# Patient Record
Sex: Male | Born: 1950 | Race: White | Hispanic: No | Marital: Single | Smoking: Former smoker
Health system: Southern US, Community
[De-identification: ages and names within clinical notes are randomized; demographics above are authoritative.]

## PROBLEM LIST (undated history)

## (undated) DIAGNOSIS — I4891 Unspecified atrial fibrillation: Secondary | ICD-10-CM

## (undated) DIAGNOSIS — E0590 Thyrotoxicosis, unspecified without thyrotoxic crisis or storm: Secondary | ICD-10-CM

## (undated) DIAGNOSIS — I10 Essential (primary) hypertension: Secondary | ICD-10-CM

## (undated) DIAGNOSIS — I499 Cardiac arrhythmia, unspecified: Secondary | ICD-10-CM

## (undated) DIAGNOSIS — E109 Type 1 diabetes mellitus without complications: Secondary | ICD-10-CM

## (undated) DIAGNOSIS — C801 Malignant (primary) neoplasm, unspecified: Secondary | ICD-10-CM

## (undated) DIAGNOSIS — Z789 Other specified health status: Secondary | ICD-10-CM

## (undated) DIAGNOSIS — Z809 Family history of malignant neoplasm, unspecified: Secondary | ICD-10-CM

## (undated) DIAGNOSIS — K635 Polyp of colon: Secondary | ICD-10-CM

## (undated) DIAGNOSIS — I639 Cerebral infarction, unspecified: Secondary | ICD-10-CM

## (undated) DIAGNOSIS — Z593 Problems related to living in residential institution: Secondary | ICD-10-CM

## (undated) HISTORY — DX: Family history of malignant neoplasm, unspecified: Z80.9

## (undated) HISTORY — PX: HEMICOLECTOMY: SHX854

## (undated) HISTORY — PX: COLONOSCOPY: SHX174

---

## 2011-06-28 ENCOUNTER — Inpatient Hospital Stay: Payer: Self-pay | Admitting: Specialist

## 2011-06-28 LAB — TROPONIN I: Troponin-I: <0.02 ng/mL

## 2011-06-28 LAB — CBC
HCT: 24.5 % — ABNORMAL LOW (ref 40.0–52.0)
MCHC: 33.8 g/dL (ref 32.0–36.0)
Platelet: 347 10*3/uL (ref 150–440)
RBC: 2.86 10*6/uL — ABNORMAL LOW (ref 4.40–5.90)
RDW: 13.2 % (ref 11.5–14.5)
WBC: 8.8 10*3/uL (ref 3.8–10.6)

## 2011-06-28 LAB — URINALYSIS, COMPLETE
Blood: NEGATIVE
Glucose,UR: >=500 mg/dL (ref 0–75)
Hyaline Cast: 1
Nitrite: NEGATIVE
Ph: 5 (ref 4.5–8.0)
RBC,UR: 1 /HPF (ref 0–5)
Squamous Epithelial: NONE SEEN
WBC UR: 1 /HPF (ref 0–5)

## 2011-06-28 LAB — COMPREHENSIVE METABOLIC PANEL
Albumin: 3.2 g/dL — ABNORMAL LOW (ref 3.4–5.0)
BUN: 27 mg/dL — ABNORMAL HIGH (ref 7–18)
Chloride: 102 mmol/L (ref 98–107)
Co2: 22 mmol/L (ref 21–32)
Creatinine: 1.36 mg/dL — ABNORMAL HIGH (ref 0.60–1.30)
EGFR (Non-African Amer.): 56 — ABNORMAL LOW
Glucose: 224 mg/dL — ABNORMAL HIGH (ref 65–99)
Potassium: 4.5 mmol/L (ref 3.5–5.1)
SGOT(AST): 23 U/L (ref 15–37)
Total Protein: 6.8 g/dL (ref 6.4–8.2)

## 2011-06-28 LAB — CK TOTAL AND CKMB (NOT AT ARMC)
CK, Total: 86 U/L (ref 35–232)
CK-MB: 1.1 ng/mL (ref 0.5–3.6)

## 2011-06-28 LAB — LIPASE, BLOOD: Lipase: 66 U/L — ABNORMAL LOW (ref 73–393)

## 2011-06-28 LAB — PROTIME-INR: INR: 1.1

## 2011-06-29 LAB — URINALYSIS, COMPLETE
Blood: NEGATIVE
Glucose,UR: 150 mg/dL (ref 0–75)
Leukocyte Esterase: NEGATIVE
Ph: 5 (ref 4.5–8.0)
Protein: NEGATIVE
RBC,UR: <1 /HPF (ref 0–5)
Specific Gravity: 1.006 (ref 1.003–1.030)
Squamous Epithelial: NONE SEEN
WBC UR: <1 /HPF (ref 0–5)

## 2011-06-29 LAB — BASIC METABOLIC PANEL
BUN: 17 mg/dL (ref 7–18)
Calcium, Total: 7.9 mg/dL — ABNORMAL LOW (ref 8.5–10.1)
Co2: 24 mmol/L (ref 21–32)
Creatinine: 0.97 mg/dL (ref 0.60–1.30)
EGFR (African American): >60
Potassium: 4.4 mmol/L (ref 3.5–5.1)
Sodium: 139 mmol/L (ref 136–145)

## 2011-06-29 LAB — CBC WITH DIFFERENTIAL/PLATELET
Basophil #: 0 10*3/uL (ref 0.0–0.1)
Eosinophil #: 0.1 10*3/uL (ref 0.0–0.7)
HCT: 26.5 % — ABNORMAL LOW (ref 40.0–52.0)
Lymphocyte #: 1.5 10*3/uL (ref 1.0–3.6)
MCHC: 33.5 g/dL (ref 32.0–36.0)
MCV: 87 fL (ref 80–100)
Monocyte #: 0.8 x10 3/mm (ref 0.2–1.0)
Neutrophil #: 4.5 10*3/uL (ref 1.4–6.5)
Neutrophil %: 64.9 %
Platelet: 290 10*3/uL (ref 150–440)
RDW: 13.9 % (ref 11.5–14.5)

## 2011-06-29 LAB — HEMOGLOBIN A1C: Hemoglobin A1C: 6.6 % — ABNORMAL HIGH (ref 4.2–6.3)

## 2011-06-29 LAB — TROPONIN I
Troponin-I: <0.02 ng/mL
Troponin-I: <0.02 ng/mL

## 2011-06-29 LAB — CK TOTAL AND CKMB (NOT AT ARMC): CK-MB: 1.2 ng/mL (ref 0.5–3.6)

## 2011-06-29 LAB — HEMOGLOBIN: HGB: 8.7 g/dL — ABNORMAL LOW (ref 13.0–18.0)

## 2011-06-30 LAB — CBC WITH DIFFERENTIAL/PLATELET
Basophil #: 0 10*3/uL (ref 0.0–0.1)
Eosinophil #: 0.2 10*3/uL (ref 0.0–0.7)
Eosinophil %: 2.1 %
HCT: 25.6 % — ABNORMAL LOW (ref 40.0–52.0)
HGB: 8.2 g/dL — ABNORMAL LOW (ref 13.0–18.0)
MCH: 28 pg (ref 26.0–34.0)
MCHC: 32.1 g/dL (ref 32.0–36.0)
MCV: 87 fL (ref 80–100)
Monocyte #: 0.7 x10 3/mm (ref 0.2–1.0)
Monocyte %: 9 %
Neutrophil #: 5.8 10*3/uL (ref 1.4–6.5)
Neutrophil %: 74.8 %
Platelet: 260 10*3/uL (ref 150–440)
RBC: 2.94 10*6/uL — ABNORMAL LOW (ref 4.40–5.90)
WBC: 7.8 10*3/uL (ref 3.8–10.6)

## 2011-07-01 ENCOUNTER — Ambulatory Visit: Payer: Self-pay | Admitting: Internal Medicine

## 2011-07-01 LAB — CBC WITH DIFFERENTIAL/PLATELET
Basophil #: 0 10*3/uL (ref 0.0–0.1)
Basophil %: 0.4 %
Eosinophil %: 0.8 %
HCT: 26.6 % — ABNORMAL LOW (ref 40.0–52.0)
HGB: 8.8 g/dL — ABNORMAL LOW (ref 13.0–18.0)
Lymphocyte #: 1.7 10*3/uL (ref 1.0–3.6)
MCH: 28.8 pg (ref 26.0–34.0)
MCHC: 33.1 g/dL (ref 32.0–36.0)
Monocyte %: 11.2 %
Neutrophil %: 70.7 %
Platelet: 318 10*3/uL (ref 150–440)
RDW: 14 % (ref 11.5–14.5)
WBC: 10.1 10*3/uL (ref 3.8–10.6)

## 2011-07-01 LAB — MAGNESIUM: Magnesium: 1.6 mg/dL — ABNORMAL LOW

## 2011-07-01 LAB — WBCS, STOOL

## 2011-07-01 LAB — POTASSIUM: Potassium: 3.9 mmol/L (ref 3.5–5.1)

## 2011-07-02 LAB — CBC WITH DIFFERENTIAL/PLATELET
Basophil #: 0.1 10*3/uL (ref 0.0–0.1)
HCT: 24.3 % — ABNORMAL LOW (ref 40.0–52.0)
HGB: 8 g/dL — ABNORMAL LOW (ref 13.0–18.0)
Lymphocyte #: 0.9 10*3/uL — ABNORMAL LOW (ref 1.0–3.6)
Lymphocyte %: 9.9 %
MCH: 28.4 pg (ref 26.0–34.0)
MCV: 87 fL (ref 80–100)
Monocyte #: 0.9 x10 3/mm (ref 0.2–1.0)
Neutrophil #: 7 10*3/uL — ABNORMAL HIGH (ref 1.4–6.5)
Platelet: 277 10*3/uL (ref 150–440)
WBC: 8.9 10*3/uL (ref 3.8–10.6)

## 2011-07-02 LAB — COMPREHENSIVE METABOLIC PANEL
Albumin: 2.9 g/dL — ABNORMAL LOW (ref 3.4–5.0)
BUN: 17 mg/dL (ref 7–18)
Bilirubin,Total: 0.5 mg/dL (ref 0.2–1.0)
Chloride: 100 mmol/L (ref 98–107)
Co2: 22 mmol/L (ref 21–32)
Creatinine: 1.35 mg/dL — ABNORMAL HIGH (ref 0.60–1.30)
EGFR (African American): >60
EGFR (Non-African Amer.): 57 — ABNORMAL LOW
Glucose: 402 mg/dL — ABNORMAL HIGH (ref 65–99)

## 2011-07-02 LAB — HEMOGLOBIN: HGB: 8.1 g/dL — ABNORMAL LOW (ref 13.0–18.0)

## 2011-07-03 LAB — CBC WITH DIFFERENTIAL/PLATELET
Basophil #: 0 10*3/uL (ref 0.0–0.1)
Basophil %: 0.1 %
Eosinophil #: 0 10*3/uL (ref 0.0–0.7)
HCT: 25.5 % — ABNORMAL LOW (ref 40.0–52.0)
Lymphocyte #: 1 10*3/uL (ref 1.0–3.6)
Lymphocyte %: 5.6 %
MCHC: 32.9 g/dL (ref 32.0–36.0)
MCV: 87 fL (ref 80–100)
Neutrophil #: 15.6 10*3/uL — ABNORMAL HIGH (ref 1.4–6.5)
Neutrophil %: 85.8 %
WBC: 18.2 10*3/uL — ABNORMAL HIGH (ref 3.8–10.6)

## 2011-07-03 LAB — BASIC METABOLIC PANEL
Anion Gap: 19 — ABNORMAL HIGH (ref 7–16)
BUN: 14 mg/dL (ref 7–18)
Chloride: 106 mmol/L (ref 98–107)
Creatinine: 1.04 mg/dL (ref 0.60–1.30)
EGFR (African American): >60
EGFR (Non-African Amer.): >60
Glucose: 267 mg/dL — ABNORMAL HIGH (ref 65–99)
Potassium: 4.4 mmol/L (ref 3.5–5.1)
Sodium: 141 mmol/L (ref 136–145)

## 2011-07-03 LAB — STOOL CULTURE

## 2011-07-04 LAB — BASIC METABOLIC PANEL
BUN: 17 mg/dL (ref 7–18)
Calcium, Total: 8.6 mg/dL (ref 8.5–10.1)
Co2: 12 mmol/L — ABNORMAL LOW (ref 21–32)
Creatinine: 1.04 mg/dL (ref 0.60–1.30)
EGFR (African American): >60
Glucose: 295 mg/dL — ABNORMAL HIGH (ref 65–99)
Potassium: 4.4 mmol/L (ref 3.5–5.1)

## 2011-07-04 LAB — CBC WITH DIFFERENTIAL/PLATELET
Basophil #: 0.1 10*3/uL (ref 0.0–0.1)
Eosinophil #: 0 10*3/uL (ref 0.0–0.7)
Lymphocyte #: 0.5 10*3/uL — ABNORMAL LOW (ref 1.0–3.6)
MCH: 28.3 pg (ref 26.0–34.0)
MCHC: 32 g/dL (ref 32.0–36.0)
MCV: 89 fL (ref 80–100)
Monocyte #: 1.2 x10 3/mm — ABNORMAL HIGH (ref 0.2–1.0)
Neutrophil #: 14.7 10*3/uL — ABNORMAL HIGH (ref 1.4–6.5)

## 2011-07-05 LAB — CBC WITH DIFFERENTIAL/PLATELET
Basophil #: 0 10*3/uL (ref 0.0–0.1)
Basophil %: 0.2 %
Eosinophil #: 0 10*3/uL (ref 0.0–0.7)
Eosinophil %: 0.1 %
HCT: 23.5 % — ABNORMAL LOW (ref 40.0–52.0)
HGB: 7.5 g/dL — ABNORMAL LOW (ref 13.0–18.0)
Lymphocyte #: 0.8 10*3/uL — ABNORMAL LOW (ref 1.0–3.6)
Lymphocyte %: 7.1 %
MCH: 28 pg (ref 26.0–34.0)
MCHC: 31.9 g/dL — ABNORMAL LOW (ref 32.0–36.0)
MCV: 88 fL (ref 80–100)
Monocyte %: 6.7 %
Neutrophil #: 9.7 10*3/uL — ABNORMAL HIGH (ref 1.4–6.5)
Neutrophil %: 85.9 %
RDW: 14.7 % — ABNORMAL HIGH (ref 11.5–14.5)

## 2011-07-05 LAB — PATHOLOGY REPORT

## 2011-07-06 LAB — CBC WITH DIFFERENTIAL/PLATELET
Basophil #: 0.1 10*3/uL (ref 0.0–0.1)
Lymphocyte %: 16 %
MCH: 27.9 pg (ref 26.0–34.0)
Monocyte #: 0.7 x10 3/mm (ref 0.2–1.0)
Monocyte %: 7.8 %
Neutrophil #: 6.3 10*3/uL (ref 1.4–6.5)
Neutrophil %: 74.1 %
Platelet: 384 10*3/uL (ref 150–440)
RBC: 2.94 10*6/uL — ABNORMAL LOW (ref 4.40–5.90)
WBC: 8.5 10*3/uL (ref 3.8–10.6)

## 2011-07-10 ENCOUNTER — Ambulatory Visit: Payer: Self-pay | Admitting: Surgery

## 2011-07-14 LAB — WOUND CULTURE

## 2011-07-16 ENCOUNTER — Ambulatory Visit: Payer: Self-pay | Admitting: Internal Medicine

## 2011-07-16 LAB — IRON AND TIBC
Iron Bind.Cap.(Total): 407 ug/dL (ref 250–450)
Iron Saturation: 11 %
Unbound Iron-Bind.Cap.: 363 ug/dL

## 2011-07-16 LAB — CBC CANCER CENTER
Basophil #: 0 x10 3/mm (ref 0.0–0.1)
Eosinophil #: 0.2 x10 3/mm (ref 0.0–0.7)
Eosinophil %: 2.1 %
HCT: 26.3 % — ABNORMAL LOW (ref 40.0–52.0)
Lymphocyte #: 1.4 x10 3/mm (ref 1.0–3.6)
Lymphocyte %: 18.2 %
MCH: 27.1 pg (ref 26.0–34.0)
MCV: 85 fL (ref 80–100)
Monocyte #: 0.7 x10 3/mm (ref 0.2–1.0)
Neutrophil %: 69.6 %
Platelet: 566 x10 3/mm — ABNORMAL HIGH (ref 150–440)
RBC: 3.09 10*6/uL — ABNORMAL LOW (ref 4.40–5.90)
RDW: 14.6 % — ABNORMAL HIGH (ref 11.5–14.5)
WBC: 7.4 x10 3/mm (ref 3.8–10.6)

## 2011-07-19 ENCOUNTER — Ambulatory Visit: Payer: Self-pay | Admitting: Internal Medicine

## 2011-08-06 ENCOUNTER — Ambulatory Visit: Payer: Self-pay | Admitting: Internal Medicine

## 2011-08-06 LAB — CBC CANCER CENTER
Basophil %: 0.4 %
Eosinophil #: 0.2 x10 3/mm (ref 0.0–0.7)
Eosinophil %: 1.8 %
Lymphocyte #: 1.5 x10 3/mm (ref 1.0–3.6)
MCH: 28.6 pg (ref 26.0–34.0)
MCHC: 32.1 g/dL (ref 32.0–36.0)
MCV: 89 fL (ref 80–100)
Monocyte #: 0.8 x10 3/mm (ref 0.2–1.0)
Neutrophil #: 6.2 x10 3/mm (ref 1.4–6.5)
Neutrophil %: 71.3 %
Platelet: 325 x10 3/mm (ref 150–440)

## 2011-09-06 ENCOUNTER — Ambulatory Visit: Payer: Self-pay | Admitting: Internal Medicine

## 2011-10-24 ENCOUNTER — Ambulatory Visit: Payer: Self-pay | Admitting: Internal Medicine

## 2011-10-24 LAB — CBC CANCER CENTER
Basophil #: 0.1 x10 3/mm (ref 0.0–0.1)
Eosinophil #: 0.2 x10 3/mm (ref 0.0–0.7)
HCT: 39.2 % — ABNORMAL LOW (ref 40.0–52.0)
Lymphocyte #: 1.7 x10 3/mm (ref 1.0–3.6)
Lymphocyte %: 26.8 %
MCHC: 33.3 g/dL (ref 32.0–36.0)
MCV: 89 fL (ref 80–100)
Monocyte #: 0.6 x10 3/mm (ref 0.2–1.0)
Neutrophil #: 3.7 x10 3/mm (ref 1.4–6.5)
RBC: 4.41 10*6/uL (ref 4.40–5.90)
RDW: 15.6 % — ABNORMAL HIGH (ref 11.5–14.5)
WBC: 6.2 x10 3/mm (ref 3.8–10.6)

## 2011-10-25 LAB — CEA: CEA: 3.9 ng/mL (ref 0.0–4.7)

## 2011-11-06 ENCOUNTER — Ambulatory Visit: Payer: Self-pay | Admitting: Internal Medicine

## 2012-01-23 ENCOUNTER — Ambulatory Visit: Payer: Self-pay | Admitting: Internal Medicine

## 2012-01-23 LAB — CANCER CENTER HEMOGLOBIN: HGB: 13.3 g/dL (ref 13.0–18.0)

## 2012-01-24 LAB — CEA: CEA: 3.2 ng/mL (ref 0.0–4.7)

## 2012-02-06 ENCOUNTER — Ambulatory Visit: Payer: Self-pay | Admitting: Internal Medicine

## 2012-03-08 DIAGNOSIS — I639 Cerebral infarction, unspecified: Secondary | ICD-10-CM

## 2012-03-08 HISTORY — DX: Cerebral infarction, unspecified: I63.9

## 2012-03-18 ENCOUNTER — Ambulatory Visit: Payer: Self-pay | Admitting: Internal Medicine

## 2012-03-19 ENCOUNTER — Inpatient Hospital Stay: Payer: Self-pay | Admitting: Internal Medicine

## 2012-03-19 LAB — CK TOTAL AND CKMB (NOT AT ARMC)
CK, Total: 108 U/L (ref 35–232)
CK-MB: 1.3 ng/mL (ref 0.5–3.6)

## 2012-03-19 LAB — URINALYSIS, COMPLETE
Bacteria: NONE SEEN
Nitrite: NEGATIVE
Ph: 6 (ref 4.5–8.0)
RBC,UR: 3 /HPF (ref 0–5)
Squamous Epithelial: NONE SEEN

## 2012-03-19 LAB — CBC
MCHC: 34 g/dL (ref 32.0–36.0)
MCV: 90 fL (ref 80–100)
Platelet: 322 10*3/uL (ref 150–440)
WBC: 12.2 10*3/uL — ABNORMAL HIGH (ref 3.8–10.6)

## 2012-03-19 LAB — COMPREHENSIVE METABOLIC PANEL
Anion Gap: 7 (ref 7–16)
Bilirubin,Total: 0.4 mg/dL (ref 0.2–1.0)
Calcium, Total: 9.5 mg/dL (ref 8.5–10.1)
EGFR (African American): >60
EGFR (Non-African Amer.): >60
SGPT (ALT): 32 U/L (ref 12–78)
Sodium: 139 mmol/L (ref 136–145)

## 2012-03-19 LAB — TROPONIN I: Troponin-I: <0.02 ng/mL

## 2012-03-19 LAB — PROTIME-INR: INR: 0.9

## 2012-03-20 LAB — CBC WITH DIFFERENTIAL/PLATELET
Basophil %: 0.5 %
Eosinophil #: 0.1 10*3/uL (ref 0.0–0.7)
Eosinophil %: 0.9 %
HCT: 40.4 % (ref 40.0–52.0)
MCH: 30 pg (ref 26.0–34.0)
MCHC: 32.9 g/dL (ref 32.0–36.0)
MCV: 91 fL (ref 80–100)
Monocyte #: 0.8 x10 3/mm (ref 0.2–1.0)
Neutrophil #: 8.1 10*3/uL — ABNORMAL HIGH (ref 1.4–6.5)
Platelet: 275 10*3/uL (ref 150–440)
RBC: 4.43 10*6/uL (ref 4.40–5.90)

## 2012-03-20 LAB — BASIC METABOLIC PANEL
BUN: 20 mg/dL — ABNORMAL HIGH (ref 7–18)
Chloride: 108 mmol/L — ABNORMAL HIGH (ref 98–107)
Co2: 21 mmol/L (ref 21–32)
Creatinine: 0.98 mg/dL (ref 0.60–1.30)
EGFR (African American): >60
Glucose: 228 mg/dL — ABNORMAL HIGH (ref 65–99)
Potassium: 4.4 mmol/L (ref 3.5–5.1)
Sodium: 138 mmol/L (ref 136–145)

## 2012-03-25 ENCOUNTER — Encounter: Payer: Self-pay | Admitting: *Deleted

## 2012-03-28 LAB — CREATININE, SERUM
EGFR (African American): >60
EGFR (Non-African Amer.): 58 — ABNORMAL LOW

## 2012-05-09 ENCOUNTER — Ambulatory Visit: Payer: Self-pay | Admitting: Internal Medicine

## 2012-06-10 ENCOUNTER — Inpatient Hospital Stay: Payer: Self-pay | Admitting: Specialist

## 2012-06-10 LAB — BASIC METABOLIC PANEL
Anion Gap: 22 — ABNORMAL HIGH (ref 7–16)
Anion Gap: 8 (ref 7–16)
BUN: 44 mg/dL — ABNORMAL HIGH (ref 7–18)
BUN: 42 mg/dL — ABNORMAL HIGH (ref 7–18)
Calcium, Total: 7.3 mg/dL — ABNORMAL LOW (ref 8.5–10.1)
Calcium, Total: 8 mg/dL — ABNORMAL LOW (ref 8.5–10.1)
Chloride: 116 mmol/L — ABNORMAL HIGH (ref 98–107)
Co2: 7 mmol/L — CL (ref 21–32)
Co2: 15 mmol/L — ABNORMAL LOW (ref 21–32)
Co2: 19 mmol/L — ABNORMAL LOW (ref 21–32)
Creatinine: 2.41 mg/dL — ABNORMAL HIGH (ref 0.60–1.30)
EGFR (Non-African Amer.): 29 — ABNORMAL LOW
Glucose: 381 mg/dL — ABNORMAL HIGH (ref 65–99)
Osmolality: 304 (ref 275–301)
Potassium: 3.5 mmol/L (ref 3.5–5.1)
Sodium: 138 mmol/L (ref 136–145)
Sodium: 142 mmol/L (ref 136–145)

## 2012-06-10 LAB — URINALYSIS, COMPLETE
Bacteria: NONE SEEN
Leukocyte Esterase: NEGATIVE
Nitrite: NEGATIVE
RBC,UR: 18 /HPF (ref 0–5)
Specific Gravity: 1.02 (ref 1.003–1.030)
Squamous Epithelial: <1
WBC UR: 1 /HPF (ref 0–5)

## 2012-06-10 LAB — COMPREHENSIVE METABOLIC PANEL
BUN: 51 mg/dL — ABNORMAL HIGH (ref 7–18)
Bilirubin,Total: 0.5 mg/dL (ref 0.2–1.0)
Calcium, Total: 8.6 mg/dL (ref 8.5–10.1)
Chloride: 94 mmol/L — ABNORMAL LOW (ref 98–107)
Co2: 7 mmol/L — CL (ref 21–32)
Creatinine: 3.07 mg/dL — ABNORMAL HIGH (ref 0.60–1.30)
EGFR (African American): 24 — ABNORMAL LOW
EGFR (Non-African Amer.): 21 — ABNORMAL LOW
Glucose: 885 mg/dL (ref 65–99)
Potassium: 6 mmol/L — ABNORMAL HIGH (ref 3.5–5.1)
SGOT(AST): 21 U/L (ref 15–37)
SGPT (ALT): 22 U/L (ref 12–78)

## 2012-06-10 LAB — CBC WITH DIFFERENTIAL/PLATELET
Basophil %: 0.2 %
Eosinophil %: 0.1 %
HGB: 10.5 g/dL — ABNORMAL LOW (ref 13.0–18.0)
Lymphocyte #: 1 10*3/uL (ref 1.0–3.6)
MCH: 30.5 pg (ref 26.0–34.0)
Monocyte #: 0.2 x10 3/mm (ref 0.2–1.0)
Neutrophil #: 21.7 10*3/uL — ABNORMAL HIGH (ref 1.4–6.5)
Platelet: 403 10*3/uL (ref 150–440)
WBC: 22.9 10*3/uL — ABNORMAL HIGH (ref 3.8–10.6)

## 2012-06-11 LAB — CBC WITH DIFFERENTIAL/PLATELET
Basophil %: 0.1 %
Eosinophil #: 0 10*3/uL (ref 0.0–0.7)
HGB: 10.9 g/dL — ABNORMAL LOW (ref 13.0–18.0)
MCHC: 34.1 g/dL (ref 32.0–36.0)
MCV: 90 fL (ref 80–100)
Monocyte #: 1.7 x10 3/mm — ABNORMAL HIGH (ref 0.2–1.0)
Monocyte %: 7.8 %
Neutrophil %: 88.1 %
Platelet: 353 10*3/uL (ref 150–440)
RBC: 3.55 10*6/uL — ABNORMAL LOW (ref 4.40–5.90)
RDW: 13.8 % (ref 11.5–14.5)

## 2012-06-11 LAB — BASIC METABOLIC PANEL
BUN: 36 mg/dL — ABNORMAL HIGH (ref 7–18)
Calcium, Total: 8.4 mg/dL — ABNORMAL LOW (ref 8.5–10.1)
Chloride: 118 mmol/L — ABNORMAL HIGH (ref 98–107)
Creatinine: 1.81 mg/dL — ABNORMAL HIGH (ref 0.60–1.30)
EGFR (African American): 46 — ABNORMAL LOW
EGFR (Non-African Amer.): 39 — ABNORMAL LOW
Glucose: 287 mg/dL — ABNORMAL HIGH (ref 65–99)
Potassium: 4.4 mmol/L (ref 3.5–5.1)

## 2012-06-11 LAB — LIPID PANEL
Cholesterol: 85 mg/dL (ref 0–200)
Ldl Cholesterol, Calc: 27 mg/dL (ref 0–100)
Triglycerides: 24 mg/dL (ref 0–200)

## 2012-06-12 LAB — BASIC METABOLIC PANEL
Anion Gap: 6 — ABNORMAL LOW (ref 7–16)
BUN: 17 mg/dL (ref 7–18)
Chloride: 113 mmol/L — ABNORMAL HIGH (ref 98–107)
Co2: 23 mmol/L (ref 21–32)
Creatinine: 1.02 mg/dL (ref 0.60–1.30)
EGFR (African American): >60
EGFR (Non-African Amer.): >60
Osmolality: 287 (ref 275–301)

## 2012-06-12 LAB — WBC: WBC: 11.3 10*3/uL — ABNORMAL HIGH (ref 3.8–10.6)

## 2012-10-29 ENCOUNTER — Ambulatory Visit: Payer: Self-pay | Admitting: Gastroenterology

## 2012-10-30 LAB — PATHOLOGY REPORT

## 2013-12-17 ENCOUNTER — Ambulatory Visit: Payer: Self-pay

## 2014-03-19 ENCOUNTER — Inpatient Hospital Stay (HOSPITAL_COMMUNITY)
Admission: EM | Admit: 2014-03-19 | Discharge: 2014-03-30 | DRG: 308 | Disposition: A | Discharge status: Home / Self Care | Payer: BC Managed Care – PPO | Source: Other Acute Inpatient Hospital | Attending: Cardiovascular Disease | Admitting: Cardiovascular Disease

## 2014-03-19 ENCOUNTER — Encounter (HOSPITAL_COMMUNITY): Payer: Self-pay | Admitting: *Deleted

## 2014-03-19 ENCOUNTER — Inpatient Hospital Stay (HOSPITAL_COMMUNITY): Payer: BC Managed Care – PPO

## 2014-03-19 ENCOUNTER — Emergency Department: Payer: Self-pay | Admitting: Emergency Medicine

## 2014-03-19 DIAGNOSIS — Z8673 Personal history of transient ischemic attack (TIA), and cerebral infarction without residual deficits: Secondary | ICD-10-CM

## 2014-03-19 DIAGNOSIS — I4581 Long QT syndrome: Secondary | ICD-10-CM | POA: Diagnosis present

## 2014-03-19 DIAGNOSIS — D6489 Other specified anemias: Secondary | ICD-10-CM | POA: Diagnosis present

## 2014-03-19 DIAGNOSIS — E440 Moderate protein-calorie malnutrition: Secondary | ICD-10-CM | POA: Diagnosis present

## 2014-03-19 DIAGNOSIS — E109 Type 1 diabetes mellitus without complications: Secondary | ICD-10-CM

## 2014-03-19 DIAGNOSIS — E0581 Other thyrotoxicosis with thyrotoxic crisis or storm: Secondary | ICD-10-CM | POA: Diagnosis present

## 2014-03-19 DIAGNOSIS — I4891 Unspecified atrial fibrillation: Secondary | ICD-10-CM

## 2014-03-19 DIAGNOSIS — Z7901 Long term (current) use of anticoagulants: Secondary | ICD-10-CM | POA: Diagnosis not present

## 2014-03-19 DIAGNOSIS — I10 Essential (primary) hypertension: Secondary | ICD-10-CM

## 2014-03-19 DIAGNOSIS — E1010 Type 1 diabetes mellitus with ketoacidosis without coma: Secondary | ICD-10-CM | POA: Diagnosis present

## 2014-03-19 DIAGNOSIS — I340 Nonrheumatic mitral (valve) insufficiency: Secondary | ICD-10-CM | POA: Diagnosis present

## 2014-03-19 DIAGNOSIS — Z794 Long term (current) use of insulin: Secondary | ICD-10-CM

## 2014-03-19 DIAGNOSIS — Z6820 Body mass index (BMI) 20.0-20.9, adult: Secondary | ICD-10-CM

## 2014-03-19 DIAGNOSIS — T380X5A Adverse effect of glucocorticoids and synthetic analogues, initial encounter: Secondary | ICD-10-CM | POA: Diagnosis present

## 2014-03-19 DIAGNOSIS — E064 Drug-induced thyroiditis: Secondary | ICD-10-CM | POA: Diagnosis present

## 2014-03-19 DIAGNOSIS — E069 Thyroiditis, unspecified: Secondary | ICD-10-CM

## 2014-03-19 DIAGNOSIS — J90 Pleural effusion, not elsewhere classified: Secondary | ICD-10-CM | POA: Diagnosis present

## 2014-03-19 DIAGNOSIS — Z8249 Family history of ischemic heart disease and other diseases of the circulatory system: Secondary | ICD-10-CM

## 2014-03-19 DIAGNOSIS — I480 Paroxysmal atrial fibrillation: Secondary | ICD-10-CM | POA: Diagnosis present

## 2014-03-19 DIAGNOSIS — T462X5A Adverse effect of other antidysrhythmic drugs, initial encounter: Secondary | ICD-10-CM | POA: Diagnosis present

## 2014-03-19 DIAGNOSIS — E0590 Thyrotoxicosis, unspecified without thyrotoxic crisis or storm: Secondary | ICD-10-CM

## 2014-03-19 DIAGNOSIS — R739 Hyperglycemia, unspecified: Secondary | ICD-10-CM

## 2014-03-19 HISTORY — DX: Malignant (primary) neoplasm, unspecified: C80.1

## 2014-03-19 HISTORY — DX: Cardiac arrhythmia, unspecified: I49.9

## 2014-03-19 HISTORY — DX: Thyrotoxicosis, unspecified without thyrotoxic crisis or storm: E05.90

## 2014-03-19 HISTORY — DX: Type 1 diabetes mellitus without complications: E10.9

## 2014-03-19 HISTORY — DX: Cerebral infarction, unspecified: I63.9

## 2014-03-19 HISTORY — DX: Unspecified atrial fibrillation: I48.91

## 2014-03-19 LAB — CBC WITH DIFFERENTIAL/PLATELET
Basophils Absolute: 0 10*3/uL (ref 0.0–0.1)
Basophils Relative: 0 % (ref 0–1)
EOS ABS: 0 10*3/uL (ref 0.0–0.7)
Eosinophils Relative: 0 % (ref 0–5)
HEMATOCRIT: 47.1 % (ref 39.0–52.0)
HEMOGLOBIN: 15.2 g/dL (ref 13.0–17.0)
LYMPHS ABS: 0.9 10*3/uL (ref 0.7–4.0)
Lymphocytes Relative: 6 % — ABNORMAL LOW (ref 12–46)
MCH: 29.6 pg (ref 26.0–34.0)
MCHC: 32.3 g/dL (ref 30.0–36.0)
MCV: 91.8 fL (ref 78.0–100.0)
Monocytes Absolute: 1 10*3/uL (ref 0.1–1.0)
Monocytes Relative: 6 % (ref 3–12)
Neutro Abs: 13.7 10*3/uL — ABNORMAL HIGH (ref 1.7–7.7)
Neutrophils Relative %: 88 % — ABNORMAL HIGH (ref 43–77)
PLATELETS: 201 10*3/uL (ref 150–400)
RBC: 5.13 MIL/uL (ref 4.22–5.81)
RDW: 14.4 % (ref 11.5–15.5)
WBC: 15.6 10*3/uL — ABNORMAL HIGH (ref 4.0–10.5)

## 2014-03-19 LAB — GLUCOSE, CAPILLARY: GLUCOSE-CAPILLARY: 134 mg/dL — AB (ref 70–99)

## 2014-03-19 LAB — MRSA PCR SCREENING: MRSA by PCR: NEGATIVE

## 2014-03-19 MED ORDER — CETYLPYRIDINIUM CHLORIDE 0.05 % MT LIQD
7.0000 mL | Freq: Two times a day (BID) | OROMUCOSAL | Status: DC
  Administered 2014-03-19 – 2014-03-22 (×7): 7 mL via OROMUCOSAL

## 2014-03-19 MED ORDER — FOLIC ACID 1 MG PO TABS
1.0000 mg | ORAL_TABLET | Freq: Every day | ORAL | Status: DC
  Administered 2014-03-20 – 2014-03-30 (×11): 1 mg via ORAL
  Filled 2014-03-19 (×11): qty 1

## 2014-03-19 MED ORDER — DABIGATRAN ETEXILATE MESYLATE 150 MG PO CAPS
150.0000 mg | ORAL_CAPSULE | Freq: Two times a day (BID) | ORAL | Status: DC
  Administered 2014-03-19 – 2014-03-30 (×22): 150 mg via ORAL
  Filled 2014-03-19 (×24): qty 1

## 2014-03-19 MED ORDER — ACETAMINOPHEN 325 MG PO TABS
650.0000 mg | ORAL_TABLET | ORAL | Status: DC | PRN
  Administered 2014-03-29: 650 mg via ORAL
  Filled 2014-03-19: qty 2

## 2014-03-19 MED ORDER — INSULIN ASPART 100 UNIT/ML ~~LOC~~ SOLN: 0.0000 [IU] | Freq: Every day | SUBCUTANEOUS | Status: DC

## 2014-03-19 MED ORDER — BENAZEPRIL HCL 20 MG PO TABS
20.0000 mg | ORAL_TABLET | Freq: Every day | ORAL | Status: DC
  Filled 2014-03-19: qty 1

## 2014-03-19 MED ORDER — METOPROLOL TARTRATE 25 MG PO TABS
25.0000 mg | ORAL_TABLET | Freq: Two times a day (BID) | ORAL | Status: DC
  Administered 2014-03-19: 25 mg via ORAL
  Filled 2014-03-19 (×3): qty 1

## 2014-03-19 MED ORDER — ONDANSETRON HCL 4 MG/2ML IJ SOLN: 4.0000 mg | Freq: Four times a day (QID) | INTRAMUSCULAR | Status: DC | PRN

## 2014-03-19 MED ORDER — INSULIN ASPART 100 UNIT/ML ~~LOC~~ SOLN
0.0000 [IU] | Freq: Three times a day (TID) | SUBCUTANEOUS | Status: DC
Start: 2014-03-20 — End: 2014-03-20
  Administered 2014-03-20 (×2): 15 [IU] via SUBCUTANEOUS

## 2014-03-19 MED ORDER — DILTIAZEM HCL 100 MG IV SOLR
5.0000 mg/h | INTRAVENOUS | Status: DC
  Administered 2014-03-19 – 2014-03-21 (×4): 15 mg/h via INTRAVENOUS
  Administered 2014-03-21: 10 mg/h via INTRAVENOUS
  Administered 2014-03-21: 15 mg/h via INTRAVENOUS
  Filled 2014-03-19 (×3): qty 100

## 2014-03-19 MED ORDER — INSULIN GLARGINE 100 UNIT/ML ~~LOC~~ SOLN
12.0000 [IU] | Freq: Every day | SUBCUTANEOUS | Status: DC
  Filled 2014-03-19: qty 0.12

## 2014-03-19 NOTE — H&P (Cosign Needed)
Patient ID: Jordan [name] MRN: [id number], DOB/AGE: 1950-11-12   Admit date: 03/19/2014   Primary Physician: Albina Billet, MD Primary Cardiologist: Humphrey Rolls - Cardiology at [place]: AF-RVR  Problem List  Past Medical History  Diagnosis Date  . Dysrhythmia     AFib  . Hyperthyroidism   . Stroke   . Cancer     colo/rectal  . Diabetes mellitus without complication     No past surgical history on file.   Allergies  Allergies not on file  HPI 83M with a history of DM1, prior amiodarone induce thyroiditis and persistent AF was seen in Clinic earlier today and was found to have elevated HR in the 140s-160s range. He reports that he has had elevated HR for months that he been refractive to effective therapy. He reports that he has been diagnosed with AF for at least a year. He was previously placed on amiodarone but developed weight loss and subsequent TSH was <0.015 with positive thyroid uptake scan and thyrotropin receptor Ab. Most recent thyroid labs were improved after stopping amiodarone in 12/2013. He is currently on sotalol 40mg  bid, recently increased from 40mg  daily in 01/2014.  He is overall asymptomatic and reports that he has had elevated HR for at least the last month (he bought a home BP monitor to assess). He drinks 1 cup of decaf coffee daily and a lot of soda but states his soda does not have caffeine either. Does not have a dx of sleep apnea.  Given his elevated HR in clinic he was sent to [facility] ED and had rates in the 160s, AF. Diltiazem gtt was started to 20mg /hr with no effect. Given his inability to tolerate amiodarone and the lack of ICU beds there he was transferred here for additional evaluation and management.   Records are unavailable but he does not report a history of CHF or CAD. However, he does have DM1 thought to be well controlled (A1c 6.5) but he has episodes of hyper/hypoglycemia. He reports taking his dabigatran without missing any doses;  however, he does not know all of his medications by name and is unclear of his entire regimen.  Home Medications   Prior to Admission medications   Medication Sig Start Date End Date Taking? Authorizing Provider  benazepril (LOTENSIN) 20 MG tablet Take 20 mg by mouth daily.    Historical Provider, MD  dabigatran (PRADAXA) 150 MG CAPS capsule Take 150 mg by mouth 2 (two) times daily.    Historical Provider, MD  folic acid (FOLVITE) 1 MG tablet Take 1 mg by mouth daily.    Historical Provider, MD  insulin aspart (NOVOLOG) 100 UNIT/ML injection Inject into the skin 3 (three) times daily before meals. 6U with breakfast, 4U lunch, 3U supper    Historical Provider, MD  insulin glargine (LANTUS) 100 UNIT/ML injection Inject 12 Units into the skin at bedtime.    Historical Provider, MD  sotalol (BETAPACE) 80 MG tablet Take 40 mg by mouth 2 (two) times daily.    Historical Provider, MD    Family History  Mother died of CV causes but details unclear. No family history of SCD  Social History  History   Social History  . Marital Status: Single    Spouse Name: N/A  . Number of Children: N/A  . Years of Education: N/A   Occupational History  . Not on file.   Social History Main Topics  . Smoking status: Not on file  .  Smokeless tobacco: Not on file  . Alcohol Use: Not on file  . Drug Use: Not on file  . Sexual Activity: Not Currently   Other Topics Concern  . Not on file   Social History Narrative  . No narrative on file     Review of Systems General:  No chills, fever, night sweats or weight changes.  Cardiovascular:  No chest pain, dyspnea on exertion, edema, orthopnea, palpitations, paroxysmal nocturnal dyspnea. Dermatological: No rash, lesions/masses Respiratory: No cough, dyspnea Urologic: No hematuria, dysuria Abdominal:   No nausea, vomiting, diarrhea, bright red blood per rectum, melena, or hematemesis Neurologic:  No visual changes, wkns, changes in mental status. All  other systems reviewed and are otherwise negative except as noted above.  Physical Exam  Blood pressure 138/74, pulse 162, temperature 98.5 F (36.9 C), temperature source Oral, resp. rate 20, height 5\' 9"  (1.753 m), weight 139 lb 1.8 oz (63.1 kg), SpO2 95 %.  General: Pleasant, NAD Psych: Normal affect. Neuro: Alert and oriented X 3. Moves all extremities spontaneously. HEENT: Normal  Neck: Supple without bruits or JVD. Lungs:  Resp regular and unlabored, CTA. Heart: RRR no s3, s4, or murmurs. Abdomen: Soft, non-tender, non-distended, BS + x 4.  Extremities: No clubbing, cyanosis or edema. DP/PT/Radials 2+ and equal bilaterally.  Labs  Troponin (Point of Care Test) [facility] ED: Na 139 Cl 112 K 4.1 CO2 27 BUN 21 Cr 0.8  WKC 6,5 Hct 38.5 Plt 257   Radiology/Studies CXR pending  ECG AF with HR in the 160s, no prior here  ASSESSMENT AND PLAN 1. Atrial fibrillation with rapid ventricular response 2. Hypertension 3. Diabetes, Type 1 by report  48M with a history of DM1, prior amiodarone induce thyroiditis and persistent AF was seen in Clinic earlier today and was found to have elevated HR in the 140s-160s range consistent with rapid AF. His management has been challenging due to amiodarone induced thyroiditis. Unfortuantely we do not have records of all of his prior rate and rhythm control strategies. Given failure of rate control, would likely favor rhythm control for him. However his AAD regimen may be challenging. We do not have records of any history of prior CAD or structural disease but given his age and history of DM1, it would be unusual for him to have no CAD. This would make using a class I agent challenging. It is also unclear why he is on such a low dose of sotalol. I do not have a prior EKG to check his QTc. At this point would favor either AAD therapy with dofetilide and at least one trial of DCCV but ablation is also a reasonable option if he is willing. Increasing  sotalol may also be a reasonable choice. He does not have obvious modifiable risk factors.  -continue diltiazem for now -start metoprolol 25mg  bid -TTE to evaluate LVEF but would do so after rate controlled or SR achieved -holding sotalol for now pending AAD therapy decision -will DCCV overnight if unstable, otherwise consider in AM. Will make patient NPO p MN -TSH, T4 -continue pradaxa 150mg  bid. He states that he takes regularly without missing doses but he is not savvy with his medications so can consider TEE prior to rhythm management if unsure  2. Hypertension -continue benazepril -start metoprolol  3. DM -home lantus  -SSI -diabetic diet  FULL CODE  Signed, Raliegh Ip, MD MPH 03/19/2014, 10:21 PM

## 2014-03-20 ENCOUNTER — Inpatient Hospital Stay (HOSPITAL_COMMUNITY): Payer: BC Managed Care – PPO

## 2014-03-20 DIAGNOSIS — E0591 Thyrotoxicosis, unspecified with thyrotoxic crisis or storm: Secondary | ICD-10-CM

## 2014-03-20 DIAGNOSIS — E1010 Type 1 diabetes mellitus with ketoacidosis without coma: Secondary | ICD-10-CM

## 2014-03-20 DIAGNOSIS — I4891 Unspecified atrial fibrillation: Secondary | ICD-10-CM

## 2014-03-20 DIAGNOSIS — I639 Cerebral infarction, unspecified: Secondary | ICD-10-CM

## 2014-03-20 LAB — COMPREHENSIVE METABOLIC PANEL
ALT: 30 U/L (ref 0–53)
ANION GAP: 8 (ref 5–15)
AST: 31 U/L (ref 0–37)
Albumin: 3.4 g/dL — ABNORMAL LOW (ref 3.5–5.2)
Alkaline Phosphatase: 112 U/L (ref 39–117)
BILIRUBIN TOTAL: 1.2 mg/dL (ref 0.3–1.2)
BUN: 18 mg/dL (ref 6–23)
CHLORIDE: 106 mmol/L (ref 96–112)
CO2: 24 mmol/L (ref 19–32)
Calcium: 9.2 mg/dL (ref 8.4–10.5)
Creatinine, Ser: 0.93 mg/dL (ref 0.50–1.35)
GFR calc Af Amer: >90 mL/min (ref >90)
GFR, EST NON AFRICAN AMERICAN: 88 mL/min — AB (ref >90)
Glucose, Bld: 235 mg/dL — ABNORMAL HIGH (ref 70–99)
Potassium: 4.2 mmol/L (ref 3.5–5.1)
Sodium: 138 mmol/L (ref 135–145)
Total Protein: 6.6 g/dL (ref 6.0–8.3)

## 2014-03-20 LAB — BASIC METABOLIC PANEL
Anion gap: 12 (ref 5–15)
BUN: 20 mg/dL (ref 6–23)
CO2: 19 mmol/L (ref 19–32)
CREATININE: 1.01 mg/dL (ref 0.50–1.35)
Calcium: 9.4 mg/dL (ref 8.4–10.5)
Chloride: 106 mmol/L (ref 96–112)
GFR calc non Af Amer: 77 mL/min — ABNORMAL LOW (ref >90)
GFR, EST AFRICAN AMERICAN: 89 mL/min — AB (ref >90)
Glucose, Bld: 386 mg/dL — ABNORMAL HIGH (ref 70–99)
POTASSIUM: 4.9 mmol/L (ref 3.5–5.1)
SODIUM: 137 mmol/L (ref 135–145)

## 2014-03-20 LAB — GLUCOSE, CAPILLARY
GLUCOSE-CAPILLARY: 486 mg/dL — AB (ref 70–99)
GLUCOSE-CAPILLARY: 579 mg/dL — AB (ref 70–99)
Glucose-Capillary: 375 mg/dL — ABNORMAL HIGH (ref 70–99)
Glucose-Capillary: 384 mg/dL — ABNORMAL HIGH (ref 70–99)

## 2014-03-20 LAB — LIPID PANEL
Cholesterol: 112 mg/dL (ref 0–200)
HDL: 53 mg/dL (ref >39)
LDL Cholesterol: 50 mg/dL (ref 0–99)
TRIGLYCERIDES: 46 mg/dL (ref <150)
Total CHOL/HDL Ratio: 2.1 RATIO
VLDL: 9 mg/dL (ref 0–40)

## 2014-03-20 LAB — CBC
HCT: 33.6 % — ABNORMAL LOW (ref 39.0–52.0)
HEMATOCRIT: 37.5 % — AB (ref 39.0–52.0)
HEMOGLOBIN: 11.5 g/dL — AB (ref 13.0–17.0)
Hemoglobin: 12.4 g/dL — ABNORMAL LOW (ref 13.0–17.0)
MCH: 30 pg (ref 26.0–34.0)
MCH: 30.3 pg (ref 26.0–34.0)
MCHC: 33.1 g/dL (ref 30.0–36.0)
MCHC: 34.2 g/dL (ref 30.0–36.0)
MCV: 90.8 fL (ref 78.0–100.0)
MCV: 88.4 fL (ref 78.0–100.0)
Platelets: 266 10*3/uL (ref 150–400)
Platelets: 336 10*3/uL (ref 150–400)
RBC: 4.13 MIL/uL — AB (ref 4.22–5.81)
RBC: 3.8 MIL/uL — ABNORMAL LOW (ref 4.22–5.81)
RDW: 13.3 % (ref 11.5–15.5)
RDW: 13.4 % (ref 11.5–15.5)
WBC: 6.3 10*3/uL (ref 4.0–10.5)
WBC: 11.9 10*3/uL — ABNORMAL HIGH (ref 4.0–10.5)

## 2014-03-20 LAB — TSH: TSH: 0.006 u[IU]/mL — AB (ref 0.350–4.500)

## 2014-03-20 LAB — MAGNESIUM: MAGNESIUM: 1.9 mg/dL (ref 1.5–2.5)

## 2014-03-20 MED ORDER — METOPROLOL TARTRATE 1 MG/ML IV SOLN
10.0000 mg | Freq: Four times a day (QID) | INTRAVENOUS | Status: DC
  Administered 2014-03-20 – 2014-03-21 (×6): 10 mg via INTRAVENOUS
  Filled 2014-03-20 (×8): qty 10

## 2014-03-20 MED ORDER — PREDNISONE 20 MG PO TABS
40.0000 mg | ORAL_TABLET | Freq: Every day | ORAL | Status: DC
  Administered 2014-03-20: 40 mg via ORAL
  Filled 2014-03-20 (×2): qty 2

## 2014-03-20 MED ORDER — METOPROLOL TARTRATE 50 MG PO TABS
50.0000 mg | ORAL_TABLET | Freq: Four times a day (QID) | ORAL | Status: DC
  Filled 2014-03-20 (×4): qty 1

## 2014-03-20 MED ORDER — INSULIN REGULAR HUMAN 100 UNIT/ML IJ SOLN
INTRAMUSCULAR | Status: DC
Start: 2014-03-20 — End: 2014-03-21
  Administered 2014-03-20: 5.2 [IU]/h via INTRAVENOUS
  Filled 2014-03-20: qty 2.5

## 2014-03-20 MED ORDER — METHIMAZOLE 10 MG PO TABS
10.0000 mg | ORAL_TABLET | Freq: Three times a day (TID) | ORAL | Status: DC
  Administered 2014-03-20 (×3): 10 mg via ORAL
  Filled 2014-03-20 (×3): qty 1

## 2014-03-20 MED ORDER — INSULIN ASPART 100 UNIT/ML ~~LOC~~ SOLN
5.0000 [IU] | Freq: Three times a day (TID) | SUBCUTANEOUS | Status: DC
  Administered 2014-03-20 (×2): 5 [IU] via SUBCUTANEOUS

## 2014-03-20 MED ORDER — BENAZEPRIL HCL 10 MG PO TABS
10.0000 mg | ORAL_TABLET | Freq: Every day | ORAL | Status: DC
  Administered 2014-03-20 – 2014-03-21 (×2): 10 mg via ORAL
  Filled 2014-03-20 (×2): qty 1

## 2014-03-20 NOTE — Progress Notes (Addendum)
Progress Note  Subjective:    Remains very tachy this AM despite high dose cardizem gtt with rates in 130-150s, no significant complaints. Denies SOB, chest pain, dizziness, or lightheadedness. Does feel his heart "beating fast". Wants to eat.   Objective:   Temp:  [98.4 F (36.9 C)-99 F (37.2 C)] 99 F (37.2 C) (02/13 0400) Pulse Rate:  [139-164] 139 (02/13 0600) Resp:  [16-24] 24 (02/13 0600) BP: (87-143)/(61-96) 133/62 mmHg (02/13 0600) SpO2:  [94 %-100 %] 97 % (02/13 0600) Weight:  [139 lb 1.8 oz (63.1 kg)] 139 lb 1.8 oz (63.1 kg) (02/12 2055) Last BM Date: 03/19/14  Filed Weights   03/19/14 2055  Weight: 139 lb 1.8 oz (63.1 kg)    Intake/Output Summary (Last 24 hours) at 03/20/14 9892 Last data filed at 03/20/14 0600  Gross per 24 hour  Intake  578.5 ml  Output   2550 ml  Net -1971.5 ml    Telemetry: Atrial fibrillation w/ RVR 130-150  Physical Exam: General: Thin white male, alert, cooperative, NAD. HEENT: PERRL, EOMI. Moist mucus membranes Neck: JVP 8-9 Lungs: Clear to ascultation bilaterally, normal work of respiration, no wheezes, rales, rhonchi Heart: Tachycardic, irregular, no murmurs, gallops, or rubs. Abdomen: Thin, soft, non-tender, non-distended, BS +. Palpable abdominal aorta w/ audible pulsation on auscultation.  Extremities: No cyanosis, clubbing, 1+ edema Neurologic: Alert & oriented x3, cranial nerves II-XII intact, weak in RUE. + word-finding difficulty   Lab Results:  Basic Metabolic Panel:  Recent Labs Lab 03/19/14 2307 03/20/14 0304  NA 138 137  K 4.2 4.9  CL 106 106  CO2 24 19  GLUCOSE 235* 386*  BUN 18 20  CREATININE 0.93 1.01  CALCIUM 9.2 9.4  MG 1.9  --     Liver Function Tests:  Recent Labs Lab 03/19/14 2307  AST 31  ALT 30  ALKPHOS 112  BILITOT 1.2  PROT 6.6  ALBUMIN 3.4*    CBC:  Recent Labs Lab 03/19/14 2307 03/20/14 0304  WBC 15.6* 6.3  HGB 15.2 12.4*  HCT 47.1 37.5*  MCV 91.8 90.8  PLT 201  266     Radiology: Dg Chest Port 1 View  03/20/2014   CLINICAL DATA:  64 year old male atrial fibrillation. Entered initial  EXAM: PORTABLE CHEST - 1 VIEW  COMPARISON:  None.  FINDINGS: Portable AP semi upright view at 2350 hrs. Mild cardiomegaly. Other mediastinal contours are within normal limits. Streaky bibasilar opacity. No pneumothorax. Trace pleural fluid. No overt edema. No consolidation.  IMPRESSION: Mild cardiomegaly. Streaky basilar opacity, favor atelectasis. Trace pleural fluid.   Electronically Signed   By: Genevie Ann M.D.   On: 03/20/2014 00:22     ECG: Atrial fibrillation w/ RVR   Medications:   Scheduled Medications: . antiseptic oral rinse  7 mL Mouth Rinse BID  . benazepril  20 mg Oral Daily  . dabigatran  150 mg Oral BID  . folic acid  1 mg Oral Daily  . insulin aspart  0-15 Units Subcutaneous TID WC  . insulin aspart  0-5 Units Subcutaneous QHS  . insulin glargine  12 Units Subcutaneous QHS  . metoprolol tartrate  25 mg Oral BID     Infusions: . diltiazem (CARDIZEM) infusion 15 mg/hr (03/19/14 2326)     PRN Medications:  acetaminophen, ondansetron (ZOFRAN) IV   Assessment and Plan:  64 y/o M w/ PMHx of DM type II, atrial fibrillation, CVA and thyroid dysfunction 2/2 amiodarone administration, admitted on 03/19/14 w/ atrial  fibrillation w/ RVR.   Atrial Fibrillation w/ RVR: Patient w/ recent history of a-fib w/ amiodarone-induced thyroiditis, seen in clinic on 03/19/14, found to have HR in the 140-160 range. Takes Sotalol 40 mg bid at home. Started on Cardizem gtt  In [facility] ED w/ continued RVR. Continues to be in the 130-150's. Generally asymptomatic. TSH repeated overnight, very low at 0.006. fT4 pending. Most likely related to thyroiditis.  -Wean Cardizem gtt to 10 mg/hr -Increase Metoprolol to 50 mg q6h -Pradaxa 150 mg bid -TTE today -Thyroid management as above   DM type II: Reportedly well controlled. Takes Lantus 12 units qhs + Novolog 6 units w/  breakfast and lunch, 3 units w/ dinner. HbA1c pending.  -Continue Lantus 12 units qhs -ISS-M + HS coverage; may need meal time coverage as well  HTN: Normotensive currently.  -Cardizem gtt + Metoprolol as above - Decrease benazepril 10 mg daily    Thyroid storm/amiodarone-Induced Hyperthyroidism: Placed on amiodarone w/in the year, developed weight loss (>10 lbs) and found to have TSH of >0.015 w/ positive thyroid uptake and thyrotropin receptor Ab per chart review. No treatment received, apparently had improved TSH after stopping Amiodarone. Repeat TSH during this admission 0.006. 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 Patient is not clear about the last time he received Amiodarone. BP stable currently, no obvious thyroid nodules or goiter on exam.  -fT4 pending -Start Methimazole  Previous CVA with RUE weakness: Continue Pradaxa. PT to see when HR improved.   Natasha Bence, MD PGY-2 Internal Medicine Pager: [phone]  Patient seen and examined with Dr. Ronnald Ramp. We discussed all aspects of the encounter. I agree with the assessment and plan as stated above.   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 Would increase lopressor to 50 q6. Start methimazone 10 tid and prednisone 40 daily. Would wean diltiazem in favor of more b-blocker. Would not DC-CV today as BP stable and AF likely to recur in setting of thyroid storm. Check thryoid abs. Will need endocrine to see. Unclear when amiodarone was stopped so will check amio level prior to considering another AA therapy (need level < 0.3) though he seems to have tolerated low-dose sotalol prior to admit. Interestingly there have been studies looking at continuing amio in these settings and it can be used if unable to control AF despite the possibility of AIT. Continue Pradaxa for anticoagulation. Will need echo today. Get thyroid  u/s.   Will need to resume insulin.   Daniel Bensimhon,MD 8:52 AM

## 2014-03-20 NOTE — Progress Notes (Signed)
eLink Physician-Brief Progress Note Patient Name: Jordan [name] DOB: September 01, 1950 MRN: [id number]   Date of Service  03/20/2014  HPI/Events of Note  Hyperglycemia, thyroiditis, PAF  eICU Interventions  Rx insulin drip.  PCCM full consult to follow     Intervention Category Major Interventions: Hyperglycemia - active titration of insulin therapy  Asencion Noble 03/20/2014, 8:09 PM

## 2014-03-20 NOTE — Progress Notes (Signed)
Patients blood sugar is 579.  Dr. Ashok Norris notified and requested CCM be notified to manage. Spoke with Dr. Joya Gaskins. Orders obtained.

## 2014-03-20 NOTE — Consult Note (Addendum)
PULMONARY  / CRITICAL CARE MEDICINE CONSULTATION   Name: Jordan [name] MRN: [id number] DOB: [birth year]    ADMISSION DATE:  03/19/2014 CONSULTATION DATE: 03/20/14  REQUESTING CLINICIAN: Sanda Klein, MD PRIMARY SERVICE: Cardiology  CHIEF COMPLAINT:  Palpitations.  BRIEF PATIENT DESCRIPTION: 64 y/o Jordan [name] with DM1 and hyperthyroidisim due to amiodarone toxicity with DKA and thyroid storm.  SIGNIFICANT EVENTS / STUDIES:  Persistent RVR  LINES / TUBES: PIV x2  CULTURES: None  ANTIBIOTICS: None  HISTORY OF PRESENT ILLNESS:   Jordan [name] is a Jordan [name] with a history of DM1 as well as amiodarone use for treatment of atrial fibrillation. Jordan [name] developed hyperthyroidism late in 2015 and this was believed to be due to his amiodarone use. Per the H&P, Jordan [name] did have iodine uptake at that time as well as positive anti-thyroid Abs consistent with underlying thyroid disease exacerbated by iodine exposure (type 1 amiodarone induced thyrotoxicosis). Jordan [name] stopped taking amiodarone in November 2015 and per the record had a response. Jordan [name] presented to clinic and was found to be tachycardic to the 140s-160s, and was found to be in AF with RVR. Jordan [name] was admitted to the cardiology service and underwent treatment aimed at both rate and rhythm control. Jordan [name] was also treated for hyperthyroidism with methemazole 10 mg three times daily, prednisone 40 mg, and calcium channel blockers.  PAST MEDICAL HISTORY :  Past Medical History  Diagnosis Date  . Dysrhythmia     AFib  . Hyperthyroidism   . Stroke   . Cancer     colo/rectal  . Diabetes mellitus without complication    History reviewed. No pertinent past surgical history. Prior to Admission medications   Medication Sig Start Date End Date Taking? Authorizing Provider  benazepril (LOTENSIN) 20 MG tablet Take 20 mg by mouth daily.   Yes Historical Provider, MD  dabigatran (PRADAXA) 150 MG CAPS capsule Take 150 mg by mouth 2 (two) times daily.   Yes  Historical Provider, MD  folic acid (FOLVITE) 1 MG tablet Take 1 mg by mouth daily.   Yes Historical Provider, MD  insulin aspart (NOVOLOG) 100 UNIT/ML injection Inject 3-6 Units into the skin See admin instructions. 6units with breakfast, 4U lunch, 3U supper   Yes Historical Provider, MD  insulin glargine (LANTUS) 100 UNIT/ML injection Inject 12 Units into the skin daily.    Yes Historical Provider, MD  sotalol (BETAPACE) 80 MG tablet Take 40 mg by mouth 2 (two) times daily.   Yes Historical Provider, MD   No Known Allergies  FAMILY HISTORY:  No family of iodine-related hyperthyrodisim.  SOCIAL HISTORY:  reports that Jordan [name] has never smoked. Jordan [name] does not have any smokeless tobacco history on file. Jordan [name] reports that Jordan [name] does not drink alcohol or use illicit drugs.  REVIEW OF SYSTEMS:  Patient reports Jordan [name] has not been feeling well for the past two days.  SUBJECTIVE:   VITAL SIGNS: Temp:  [98.3 F (36.8 C)-99.3 F (37.4 C)] 99.3 F (37.4 C) (02/13 1942) Pulse Rate:  [136-162] 155 (02/13 2300) Resp:  [17-41] 19 (02/13 2300) BP: (87-133)/(61-82) 103/67 mmHg (02/13 2300) SpO2:  [91 %-100 %] 95 % (02/13 2300) HEMODYNAMICS:   VENTILATOR SETTINGS:   INTAKE / OUTPUT: Intake/Output      02/13 0701 - 02/14 0700   P.O. 1200   I.V. (mL/kg) 247.5 (3.9)   Total Intake(mL/kg) 1447.5 (22.9)   Urine (mL/kg/hr) 2425 (2.3)   Stool 0 (0)   Total Output 2425   Net -977.5  Urine Occurrence 1 x   Stool Occurrence 1 x     PHYSICAL EXAMINATION: General:  Jordan [name], flushed, with perspiration. Neuro:  Appears grossly intact HEENT:  MMM Neck: No JVD Cardiovascular:  Tachycardic rate Lungs:  CTAB Abdomen:  Soft, nontender Musculoskeletal:  Thin, no deformities Skin:  Flushed, but no clear rashes.  LABS:  CBC  Recent Labs Lab 03/19/14 2307 03/20/14 0304 03/20/14 0945  WBC 15.6* 6.3 11.9*  HGB 15.2 12.4* 11.5*  HCT 47.1 37.5* 33.6*  PLT 201 266 336   Coag's No results for  input(s): APTT, INR in the last 168 hours. BMET  Recent Labs Lab 03/19/14 2307 03/20/14 0304  NA 138 137  K 4.2 4.9  CL 106 106  CO2 24 19  BUN 18 20  CREATININE 0.93 1.01  GLUCOSE 235* 386*   Electrolytes  Recent Labs Lab 03/19/14 2307 03/20/14 0304  CALCIUM 9.2 9.4  MG 1.9  --    Sepsis Markers No results for input(s): LATICACIDVEN, PROCALCITON, O2SATVEN in the last 168 hours. ABG No results for input(s): PHART, PCO2ART, PO2ART in the last 168 hours. Liver Enzymes  Recent Labs Lab 03/19/14 2307  AST 31  ALT 30  ALKPHOS 112  BILITOT 1.2  ALBUMIN 3.4*   Cardiac Enzymes No results for input(s): TROPONINI, PROBNP in the last 168 hours. Glucose  Recent Labs Lab 03/19/14 2226 03/20/14 0748 03/20/14 1133 03/20/14 1742 03/20/14 1940  GLUCAP 134* 375* 384* 486* 579*    Imaging US Soft Tissue Head/neck  03/20/2014   CLINICAL DATA:  Thyroiditis.  EXAM: THYROID ULTRASOUND  TECHNIQUE: Ultrasound examination of the thyroid gland and adjacent soft tissues was performed.  COMPARISON:  None.  FINDINGS: Right thyroid lobe  Measurements: 5.6 x 2.2 x 1.8 cm. Mildly heterogeneous parenchymal echogenicity. No discrete nodules or masses visualized.  Left thyroid lobe  Measurements: 5.6 x 2.3 x 1.8 cm. Mildly heterogeneous parenchymal echogenicity. No discrete nodules or masses visualized.  Isthmus  Thickness: 4 mm.  No nodules visualized.  Lymphadenopathy  None visualized.  IMPRESSION: Mildly enlarged thyroid. No discrete thyroid nodules or masses visualized.   Electronically Signed   By: Earle Gell M.D.   On: 03/20/2014 16:41   Dg Chest Port 1 View  03/20/2014   CLINICAL DATA:  64 year old male atrial fibrillation. Entered initial  EXAM: PORTABLE CHEST - 1 VIEW  COMPARISON:  None.  FINDINGS: Portable AP semi upright view at 2350 hrs. Mild cardiomegaly. Other mediastinal contours are within normal limits. Streaky bibasilar opacity. No pneumothorax. Trace pleural fluid. No overt  edema. No consolidation.  IMPRESSION: Mild cardiomegaly. Streaky basilar opacity, favor atelectasis. Trace pleural fluid.   Electronically Signed   By: Genevie Ann M.D.   On: 03/20/2014 00:22    EKG: Tachycardic rate, no clear P-waves, consistent with atrial fibrillation CXR: no infiltrates seen  ASSESSMENT / PLAN:  PULMONARY A: No active issues P:   None; avoid beta-agonsists given heart rate.  CARDIOVASCULAR A: AF with RVR HTN P:   Defer to cardiology; but may benefit from increased beta-blockade over calcium channel blockers given hyperthyroid state.  RENAL A: Mild gap acidosis P:   Due to DKA; repeating chemistries now. May need some more aggressive hydration.  GASTROINTESTINAL A: No active issues P:    HEMATOLOGIC A: Leukocytosis Anemia P:   Multifactorial; due to stress of DKA and Hyperrhyroid state.  INFECTIOUS A: No active issues P:     ENDOCRINE A: DKA Type 1 amiodarone thyrotoxicosis Glucocorticoid use  P:   DKA was likely triggered by steroid use; driven as well by hyperthyroid state. Agree with assessment that this is likely a type 1 amiodarone toxicity (increased T3/T4 production due to iodine availability), particularly if anti-thyroid antibodies are elevated. One half life of the drug has elapsed since Jordan [name] stopped taking the drug, so it seems unusual for it to have returned at this time. Jordan [name] denies exposure to IV contrast or other iodine-containing substance. Recommend stopping steroids as these are for treatment of type 2 amiodarone toxicity. On DKA protocol. Increase methimazole to 20 mg TID.   NEUROLOGIC A: No acute issues. P:    TODAY'S SUMMARY: 64 y/o Jordan [name] with DKA and hyperthyroidism due to amiodarone.   I have personally obtained a history, examined the patient, evaluated laboratory and imaging results, formulated the assessment and plan and placed orders.  CRITICAL CARE: The patient is critically ill with multiple organ systems failure and  requires high complexity decision making for assessment and support, frequent evaluation and titration of therapies, application of advanced monitoring technologies and extensive interpretation of multiple databases. Critical Care Time devoted to patient care services described in this note is 45 minutes.   Luz Brazen, MD Pulmonary & Critical Care Medicine March 20, 2014, 11:59 PM

## 2014-03-21 ENCOUNTER — Encounter (HOSPITAL_COMMUNITY): Payer: Self-pay | Admitting: Anesthesiology

## 2014-03-21 ENCOUNTER — Encounter (HOSPITAL_COMMUNITY)
Admission: EM | Disposition: A | Discharge status: Home / Self Care | Payer: Self-pay | Source: Other Acute Inpatient Hospital | Attending: Cardiovascular Disease

## 2014-03-21 DIAGNOSIS — E1010 Type 1 diabetes mellitus with ketoacidosis without coma: Secondary | ICD-10-CM

## 2014-03-21 LAB — GLUCOSE, CAPILLARY
GLUCOSE-CAPILLARY: 178 mg/dL — AB (ref 70–99)
GLUCOSE-CAPILLARY: 136 mg/dL — AB (ref 70–99)
GLUCOSE-CAPILLARY: 295 mg/dL — AB (ref 70–99)
GLUCOSE-CAPILLARY: 252 mg/dL — AB (ref 70–99)
Glucose-Capillary: 135 mg/dL — ABNORMAL HIGH (ref 70–99)
Glucose-Capillary: 141 mg/dL — ABNORMAL HIGH (ref 70–99)
Glucose-Capillary: 172 mg/dL — ABNORMAL HIGH (ref 70–99)
Glucose-Capillary: 173 mg/dL — ABNORMAL HIGH (ref 70–99)
Glucose-Capillary: 257 mg/dL — ABNORMAL HIGH (ref 70–99)
Glucose-Capillary: 260 mg/dL — ABNORMAL HIGH (ref 70–99)
Glucose-Capillary: 325 mg/dL — ABNORMAL HIGH (ref 70–99)
Glucose-Capillary: 394 mg/dL — ABNORMAL HIGH (ref 70–99)
Glucose-Capillary: 437 mg/dL — ABNORMAL HIGH (ref 70–99)
Glucose-Capillary: 558 mg/dL (ref 70–99)

## 2014-03-21 LAB — BASIC METABOLIC PANEL
Anion gap: 7 (ref 5–15)
BUN: 39 mg/dL — ABNORMAL HIGH (ref 6–23)
CO2: 22 mmol/L (ref 19–32)
Calcium: 9.9 mg/dL (ref 8.4–10.5)
Chloride: 105 mmol/L (ref 96–112)
Creatinine, Ser: 1.31 mg/dL (ref 0.50–1.35)
GFR, EST AFRICAN AMERICAN: 65 mL/min — AB (ref >90)
GFR, EST NON AFRICAN AMERICAN: 56 mL/min — AB (ref >90)
Glucose, Bld: 221 mg/dL — ABNORMAL HIGH (ref 70–99)
Potassium: 3.9 mmol/L (ref 3.5–5.1)
Sodium: 134 mmol/L — ABNORMAL LOW (ref 135–145)

## 2014-03-21 LAB — T4, FREE: Free T4: 4.25 ng/dL — ABNORMAL HIGH (ref 0.80–1.80)

## 2014-03-21 LAB — MAGNESIUM: MAGNESIUM: 2.3 mg/dL (ref 1.5–2.5)

## 2014-03-21 SURGERY — CARDIOVERSION
Anesthesia: Monitor Anesthesia Care

## 2014-03-21 MED ORDER — SODIUM CHLORIDE 0.9 % IV SOLN
250.0000 mL | INTRAVENOUS | Status: DC | PRN
  Administered 2014-03-22 – 2014-03-23 (×2): 250 mL via INTRAVENOUS

## 2014-03-21 MED ORDER — SODIUM CHLORIDE 0.9 % IJ SOLN
3.0000 mL | Freq: Two times a day (BID) | INTRAMUSCULAR | Status: DC
  Administered 2014-03-21 – 2014-03-30 (×15): 3 mL via INTRAVENOUS

## 2014-03-21 MED ORDER — INSULIN ASPART 100 UNIT/ML ~~LOC~~ SOLN
2.0000 [IU] | SUBCUTANEOUS | Status: DC
  Administered 2014-03-21: 6 [IU] via SUBCUTANEOUS

## 2014-03-21 MED ORDER — DOFETILIDE 500 MCG PO CAPS
500.0000 ug | ORAL_CAPSULE | Freq: Two times a day (BID) | ORAL | Status: DC
  Administered 2014-03-21: 500 ug via ORAL
  Filled 2014-03-21 (×3): qty 1

## 2014-03-21 MED ORDER — INSULIN GLARGINE 100 UNIT/ML ~~LOC~~ SOLN
10.0000 [IU] | Freq: Every day | SUBCUTANEOUS | Status: DC
  Filled 2014-03-21: qty 0.1

## 2014-03-21 MED ORDER — INSULIN GLARGINE 100 UNIT/ML ~~LOC~~ SOLN
10.0000 [IU] | SUBCUTANEOUS | Status: DC
  Filled 2014-03-21: qty 0.1

## 2014-03-21 MED ORDER — SODIUM CHLORIDE 0.9 % IV SOLN
INTRAVENOUS | Status: DC
  Administered 2014-03-21: 6 [IU]/h via INTRAVENOUS
  Administered 2014-03-21: 5.4 [IU]/h via INTRAVENOUS
  Filled 2014-03-21 (×2): qty 2.5

## 2014-03-21 MED ORDER — PROPYLTHIOURACIL 50 MG PO TABS
100.0000 mg | ORAL_TABLET | Freq: Four times a day (QID) | ORAL | Status: DC
  Administered 2014-03-21 – 2014-03-25 (×17): 100 mg via ORAL
  Filled 2014-03-21 (×20): qty 2

## 2014-03-21 MED ORDER — INSULIN ASPART 100 UNIT/ML ~~LOC~~ SOLN: 2.0000 [IU] | SUBCUTANEOUS | Status: DC

## 2014-03-21 MED ORDER — METHIMAZOLE 10 MG PO TABS
20.0000 mg | ORAL_TABLET | Freq: Three times a day (TID) | ORAL | Status: DC
  Filled 2014-03-21 (×3): qty 2

## 2014-03-21 MED ORDER — DOFETILIDE 250 MCG PO CAPS
250.0000 ug | ORAL_CAPSULE | Freq: Two times a day (BID) | ORAL | Status: DC
  Filled 2014-03-21 (×2): qty 1

## 2014-03-21 MED ORDER — SODIUM CHLORIDE 0.9 % IJ SOLN
3.0000 mL | INTRAMUSCULAR | Status: DC | PRN
  Administered 2014-03-26: 3 mL via INTRAVENOUS
  Filled 2014-03-21: qty 3

## 2014-03-21 MED ORDER — DEXTROSE 10 % IV SOLN: INTRAVENOUS | Status: DC | PRN

## 2014-03-21 MED ORDER — POTASSIUM CHLORIDE CRYS ER 20 MEQ PO TBCR
40.0000 meq | EXTENDED_RELEASE_TABLET | Freq: Once | ORAL | Status: AC
  Administered 2014-03-21: 40 meq via ORAL
  Filled 2014-03-21: qty 2

## 2014-03-21 MED ORDER — METOPROLOL TARTRATE 1 MG/ML IV SOLN: 10.0000 mg | Freq: Four times a day (QID) | INTRAVENOUS | Status: DC

## 2014-03-21 MED ORDER — METOPROLOL TARTRATE 1 MG/ML IV SOLN
5.0000 mg | INTRAVENOUS | Status: DC | PRN
  Administered 2014-03-22 – 2014-03-24 (×3): 5 mg via INTRAVENOUS
  Filled 2014-03-21 (×3): qty 5

## 2014-03-21 MED ORDER — INSULIN GLARGINE 100 UNIT/ML ~~LOC~~ SOLN
10.0000 [IU] | Freq: Every day | SUBCUTANEOUS | Status: DC
  Administered 2014-03-21: 10 [IU] via SUBCUTANEOUS
  Filled 2014-03-21: qty 0.1

## 2014-03-21 NOTE — Progress Notes (Signed)
PULMONARY  / CRITICAL CARE MEDICINE CONSULTATION   Name: Jordan [name] MRN: [id number] DOB: 1950-10-28    ADMISSION DATE:  03/19/2014 CONSULTATION DATE: 03/20/14  REQUESTING CLINICIAN: Sanda Klein, MD PRIMARY SERVICE: Cardiology  CHIEF COMPLAINT:  Palpitations.  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  SIGNIFICANT EVENTS / STUDIES:  Persistent RVR  LINES / TUBES: PIV x2  CULTURES: None  ANTIBIOTICS: None  HISTORY OF PRESENT ILLNESS:   Jordan [name] is a 64 year old man with a history of DM1 as well as amiodarone use for treatment of atrial fibrillation. He developed hyperthyroidism late in 2015 and this was believed to be due to his amiodarone use. 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 He stopped taking amiodarone in November 2015 and per the record had a response. He presented to clinic and was found to be tachycardic to the 140s-160s, and was found to be in AF with RVR. He was admitted to the cardiology service and underwent treatment aimed at both rate and rhythm control. He was also treated for hyperthyroidism with methemazole 10 mg three times daily, prednisone 40 mg, and calcium channel blockers.  SUBJECTIVE:   VITAL SIGNS: Temp:  [98.5 F (36.9 C)-99.3 F (37.4 C)] 98.6 F (37 C) (02/14 0800) Pulse Rate:  [96-166] 160 (02/14 1100) Resp:  [16-41] 18 (02/14 1100) BP: (92-137)/(64-119) 99/74 mmHg (02/14 1100) SpO2:  [90 %-95 %] 95 % (02/14 1100) HEMODYNAMICS:   VENTILATOR SETTINGS:   INTAKE / OUTPUT: Intake/Output      02/13 0701 - 02/14 0700 02/14 0701 - 02/15 0700   P.O. 1200 240   I.V. (mL/kg) 411.5 (6.5) 79.9 (1.3)   Total Intake(mL/kg) 1611.5 (25.5) 319.9 (5.1)   Urine (mL/kg/hr) 2525 (1.7) 150 (0.3)   Stool  0 (0)    Total Output 2525 150   Net -913.5 +169.9        Urine Occurrence 1 x    Stool Occurrence 1 x      PHYSICAL EXAMINATION: General:  Middle-aged man, supine in bed Neuro:  Appears grossly intact HEENT:  MMM Neck: No JVD Cardiovascular:  Tachycardic rate Lungs:  CTAB Abdomen:  Soft, nontender Musculoskeletal:  Thin, no deformities Skin:  Flushed, but no clear rashes.  LABS:  CBC  Recent Labs Lab 03/19/14 2307 03/20/14 0304 03/20/14 0945  WBC 15.6* 6.3 11.9*  HGB 15.2 12.4* 11.5*  HCT 47.1 37.5* 33.6*  PLT 201 266 336   Coag's No results for input(s): APTT, INR in the last 168 hours. BMET  Recent Labs Lab 03/19/14 2307 03/20/14 0304 03/21/14 0042  NA 138 137 134*  K 4.2 4.9 3.9  CL 106 106 105  CO2 24 19 22   BUN 18 20 39*  CREATININE 0.93 1.01 1.31  GLUCOSE 235* 386* 221*   Electrolytes  Recent Labs Lab 03/19/14 2307 03/20/14 0304 03/21/14 0042 03/21/14 1030  CALCIUM 9.2 9.4 9.9  --   MG 1.9  --   --  2.3   Sepsis Markers No results for input(s): LATICACIDVEN, PROCALCITON, O2SATVEN in the last 168 hours. ABG No results for input(s): PHART, PCO2ART, PO2ART in the last 168 hours. Liver Enzymes  Recent Labs Lab 03/19/14 2307  AST 31  ALT 30  ALKPHOS 112  BILITOT 1.2  ALBUMIN 3.4*  Cardiac Enzymes No results for input(s): TROPONINI, PROBNP in the last 168 hours. Glucose  Recent Labs Lab 03/21/14 0036 03/21/14 0126 03/21/14 0209 03/21/14 0320 03/21/14 0426 03/21/14 0514  GLUCAP 257* 172* 173* 178* 136* 135*    Imaging US Soft Tissue Head/neck  03/20/2014   CLINICAL DATA:  Thyroiditis.  EXAM: THYROID ULTRASOUND  TECHNIQUE: Ultrasound examination of the thyroid gland and adjacent soft tissues was performed.  COMPARISON:  None.  FINDINGS: Right thyroid lobe  Measurements: 5.6 x 2.2 x 1.8 cm. Mildly heterogeneous parenchymal echogenicity. No discrete nodules or masses visualized.  Left thyroid lobe  Measurements: 5.6 x 2.3 x  1.8 cm. Mildly heterogeneous parenchymal echogenicity. No discrete nodules or masses visualized.  Isthmus  Thickness: 4 mm.  No nodules visualized.  Lymphadenopathy  None visualized.  IMPRESSION: Mildly enlarged thyroid. No discrete thyroid nodules or masses visualized.   Electronically Signed   By: Earle Gell M.D.   On: 03/20/2014 16:41   Dg Chest Port 1 View  03/20/2014   CLINICAL DATA:  64 year old male atrial fibrillation. Entered initial  EXAM: PORTABLE CHEST - 1 VIEW  COMPARISON:  None.  FINDINGS: Portable AP semi upright view at 2350 hrs. Mild cardiomegaly. Other mediastinal contours are within normal limits. Streaky bibasilar opacity. No pneumothorax. Trace pleural fluid. No overt edema. No consolidation.  IMPRESSION: Mild cardiomegaly. Streaky basilar opacity, favor atelectasis. Trace pleural fluid.   Electronically Signed   By: Genevie Ann M.D.   On: 03/20/2014 00:22    EKG: Tachycardic rate, no clear P-waves, consistent with atrial fibrillation CXR: no infiltrates seen  ASSESSMENT / PLAN:   CARDIOVASCULAR A: AF with RVR HTN P:   Cardioversion planned  RENAL A: Mild gap acidosis P:   Due to DKA; repeating chemistries now. May need some more aggressive hydration.  GASTROINTESTINAL A: No active issues P:    HEMATOLOGIC A: Leukocytosis Anemia P:   Multifactorial; due to stress of DKA and Hyperrhyroid state.   ENDOCRINE A: DKA Type 1 amiodarone thyrotoxicosis Glucocorticoid use P:   DKA was likely triggered by steroid use; driven as well by hyperthyroid state. dc DKA protocol -give 10U lantus & 2h overlap before stopping drip -then go to SSI resistant scale Increase methimazole to 20 mg TID.     TODAY'S SUMMARY: 64 y/o man with DKA and hyperthyroidism due to amiodarone -can come off insulin gtt, will sign off   I have personally obtained a history, examined the patient, evaluated laboratory and imaging results, formulated the assessment and plan and placed  orders.  Jordan Noel MD  March 21, 2014, 2:00 PM

## 2014-03-21 NOTE — Progress Notes (Signed)
Nurse in room with pt at 1830 when monitor read asystole. Asystole Rhythm lasted 2-3 seconds. RN checked lead placement and assured leads were secure on pt chest. Pt asymptomatic with rhythm change. Pt converted to brady rhythm at 1830 with a rate of 47-53. EKG performed at 1900 and revealed Junctional rhythm with nonspecific T wave abnormality. BP at 1833 was 88/49 (59) which was a change from his prior BP at 1800 of 106/70 (81). Cartizem stopped at [place].  Dr. Elias Else informed at 1925 of change in patient status. Dr. Elias Else confirmed to hold the Cartizem, to hold the pm Tikosyn dose, and to hold metoprolol if rate is <60. Report given to [employer]. Will continue to monitor.

## 2014-03-21 NOTE — Progress Notes (Addendum)
Progress Note  Subjective:    Prednisone stopped overnight by CCM due to worsening CBGs and impending DKA. Methimazole increased to 20 TID. On glucommander.  Remains very tachy this AM despite high dose IV beta blocker and cardizem gtt with rates in 130-160s, no significant complaints. Denies SOB, chest pain, dizziness, or lightheadedness. Renal function slightly worse.   Thyroid u/s with diffusely enlarged gland without nodules.   Echo pending.   Objective:   Temp:  [98.3 F (36.8 C)-99.3 F (37.4 C)] 98.9 F (37.2 C) (02/14 0000) Pulse Rate:  [96-166] 166 (02/14 0600) Resp:  [16-41] 18 (02/14 0600) BP: (92-122)/(61-96) 92/77 mmHg (02/14 0600) SpO2:  [90 %-97 %] 93 % (02/14 0600) Last BM Date: 03/19/14  Filed Weights   03/19/14 2055  Weight: 63.1 kg (139 lb 1.8 oz)    Intake/Output Summary (Last 24 hours) at 03/21/14 9892 Last data filed at 03/21/14 1194  Gross per 24 hour  Intake 1325.73 ml  Output   2200 ml  Net -874.27 ml    Telemetry: Atrial fibrillation w/ RVR 130-170  Physical Exam: General: Thin white male, alert, cooperative, NAD. HEENT: PERRL, EOMI. Moist mucus membranes Neck: JVP 10 Lungs: Clear to ascultation bilaterally, normal work of respiration, no wheezes, rales, rhonchi Heart: Tachycardic, irregular, no murmurs, gallops, or rubs. Abdomen: Thin, soft, non-tender, non-distended, BS +. Palpable abdominal aorta w/ audible pulsation on auscultation.  Extremities: No cyanosis, clubbing, 1+ edema Neurologic: Alert & oriented x3, cranial nerves II-XII intact, weak in RUE. + word-finding difficulty   Lab Results:  Basic Metabolic Panel:  Recent Labs Lab 03/19/14 2307 03/20/14 0304 03/21/14 0042  NA 138 137 134*  K 4.2 4.9 3.9  CL 106 106 105  CO2 24 19 22   GLUCOSE 235* 386* 221*  BUN 18 20 39*  CREATININE 0.93 1.01 1.31  CALCIUM 9.2 9.4 9.9  MG 1.9  --   --     Liver Function Tests:  Recent Labs Lab 03/19/14 2307  AST 31  ALT  30  ALKPHOS 112  BILITOT 1.2  PROT 6.6  ALBUMIN 3.4*    CBC:  Recent Labs Lab 03/19/14 2307 03/20/14 0304 03/20/14 0945  WBC 15.6* 6.3 11.9*  HGB 15.2 12.4* 11.5*  HCT 47.1 37.5* 33.6*  MCV 91.8 90.8 88.4  PLT 201 266 336     Radiology: US Soft Tissue Head/neck  03/20/2014   CLINICAL DATA:  Thyroiditis.  EXAM: THYROID ULTRASOUND  TECHNIQUE: Ultrasound examination of the thyroid gland and adjacent soft tissues was performed.  COMPARISON:  None.  FINDINGS: Right thyroid lobe  Measurements: 5.6 x 2.2 x 1.8 cm. Mildly heterogeneous parenchymal echogenicity. No discrete nodules or masses visualized.  Left thyroid lobe  Measurements: 5.6 x 2.3 x 1.8 cm. Mildly heterogeneous parenchymal echogenicity. No discrete nodules or masses visualized.  Isthmus  Thickness: 4 mm.  No nodules visualized.  Lymphadenopathy  None visualized.  IMPRESSION: Mildly enlarged thyroid. No discrete thyroid nodules or masses visualized.   Electronically Signed   By: Earle Gell M.D.   On: 03/20/2014 16:41   Dg Chest Port 1 View  03/20/2014   CLINICAL DATA:  64 year old male atrial fibrillation. Entered initial  EXAM: PORTABLE CHEST - 1 VIEW  COMPARISON:  None.  FINDINGS: Portable AP semi upright view at 2350 hrs. Mild cardiomegaly. Other mediastinal contours are within normal limits. Streaky bibasilar opacity. No pneumothorax. Trace pleural fluid. No overt edema. No consolidation.  IMPRESSION: Mild cardiomegaly. Streaky basilar opacity, favor atelectasis.  Trace pleural fluid.   Electronically Signed   By: Genevie Ann M.D.   On: 03/20/2014 00:22     ECG: Atrial fibrillation w/ RVR   Medications:   Scheduled Medications: . antiseptic oral rinse  7 mL Mouth Rinse BID  . benazepril  10 mg Oral Daily  . dabigatran  150 mg Oral BID  . folic acid  1 mg Oral Daily  . methimazole  20 mg Oral TID  . metoprolol  10 mg Intravenous 4 times per day    Infusions: . diltiazem (CARDIZEM) infusion 15 mg/hr (03/21/14 0844)    . insulin (NOVOLIN-R) infusion 5.4 Units/hr (03/21/14 0900)    PRN Medications: acetaminophen, ondansetron (ZOFRAN) IV   Assessment and Plan:  64 y/o M w/ PMHx of DM type II, atrial fibrillation, CVA and thyroid dysfunction 2/2 to ?amiodarone administration, admitted on 03/19/14 w/thyroid storm & atrial fibrillation w/ RVR.   1) Atrial Fibrillation w/ RVR: Patient w/ recent history of a-fib w/ amiodarone-induced thyroiditis, seen in clinic on 03/19/14, found to have HR in the 140-160 range. Takes Sotalol 40 mg bid at home. 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 Had been deferring DC-CV as BP stable and AF likely to recur in setting of thyroitociosis - Remains on Pradaxa 150 mg bid - has been on for months - Echo and endocrine consults pending - I think etiology here may be Graves disease exacerbated by amio and not amio primarily. I discussed AA options with Dr. Rayann Heman and will plan to start dofetilide and DC-CV today - I also spoke with Dr. Forde Dandy who recommended switching methimazole to PTU 100 q6.Marland Kitchen He will see in am.   2) Thyrotoxicosis - as above. TSH 0.006 -Placed on amiodarone w/in the year, developed weight loss (>10 lbs) and found to have TSH of >0.015 w/ positive thyroid uptake and thyrotropin receptor Ab per chart review. No treatment received, apparently had improved TSH after stopping Amiodarone. Last dose amio seems to be in 11/15   3) DM type II:  - steroids stopped due to possible DKA. CCM helping to manage. Appreciate their help   4) Previous CVA with RUE weakness: Continue Pradaxa. PT to see when HR improved.   The patient is critically ill with  multiple organ systems failure and requires high complexity decision making for assessment and support, frequent evaluation and titration of therapies, application of advanced monitoring technologies and extensive interpretation of multiple databases.   Critical Care Time devoted to patient care services described in this note is 60 Minutes.   Criston Chancellor,MD 9:22 AM

## 2014-03-21 NOTE — Progress Notes (Addendum)
Pharmacy Consult for Dofetilide (Tikosyn) Initiation  Admit Complaint: 64 y.o. male admitted 03/19/2014 with atrial fibrillation to be initiated on dofetilide.   Assessment:  Patient Exclusion Criteria: If any screening criteria checked as "Yes", then  patient  should NOT receive dofetilide until criteria item is corrected. If "Yes" please indicate correction plan.  YES  NO Patient  Exclusion Criteria Correction Plan  [x]  []  Baseline QTc interval is greater than or equal to 440 msec. IF above YES box checked dofetilide contraindicated unless patient has ICD; then may proceed if QTc 500-550 msec or with known ventricular conduction abnormalities may proceed with QTc 550-600 msec. QTc =   Discussed with MD, watching QTc closely  []  [x]  Magnesium level is less than 1.8 mEq/l : Last magnesium:  Lab Results  Component Value Date   MG 1.9 03/19/2014         [x]  []  Potassium level is less than 4 mEq/l : Last potassium:  Lab Results  Component Value Date   K 3.9 03/21/2014       Replacing with KCl 40 meq  []  [x]  Patient is known or suspected to have a digoxin level greater than 2 ng/ml: No results found for: DIGOXIN    []  [x]  Creatinine clearance less than 20 ml/min (calculated using Cockcroft-Gault, actual body weight and serum creatinine): Estimated Creatinine Clearance: 51.5 mL/min (by C-G formula based on Cr of 1.31).    []  [x]  Patient has received drugs known to prolong the QT intervals within the last 48 hours(phenothiazines, tricyclics or tetracyclic antidepressants, erythromycin, H-1 antihistamines, cisapride, fluoroquinolones, azithromycin). Drugs not listed above may have an, as yet, undetected potential to prolong the QT interval, updated information on QT prolonging agents is available at this website:QT prolonging agents   []  [x]  Patient received a dose of hydrochlorothiazide (Oretic) alone or in any combination including triamterene (Dyazide, Maxzide) in the last 48 hours.    []  [x]  Patient received a medication known to increase dofetilide plasma concentrations prior to initial dofetilide dose:  . Trimethoprim (Primsol, Proloprim) in the last 36 hours . Verapamil (Calan, Verelan) in the last 36 hours or a sustained release dose in the last 72 hours . Megestrol (Megace) in the last 5 days  . Cimetidine (Tagamet) in the last 6 hours . Ketoconazole (Nizoral) in the last 24 hours . Itraconazole (Sporanox) in the last 48 hours  . Prochlorperazine (Compazine) in the last 36 hours    []  [x]  Patient is known to have a history of torsades de pointes; congenital or acquired long QT syndromes.   []  [x]  Patient has received a Class 1 antiarrhythmic with less than 2 half-lives since last dose. (Disopyramide, Quinidine, Procainamide, Lidocaine, Mexiletine, Flecainide, Propafenone)   []  [x]  Patient has received amiodarone therapy in the past 3 months or amiodarone level is greater than 0.3 ng/ml. amio level in process, last exposure 12/2013   Patient has been appropriately anticoagulated with Pradaxa.  Ordering provider was confirmed at [email or web address] if they are not listed on the [place] Prescribers list.  Goal of Therapy: Follow renal function, electrolytes, potential drug interactions, and dose adjustment. Provide education and 1 week supply at discharge.  Plan:  []   Physician selected initial dose within range recommended for patients level of renal function - will monitor for response.  [x]   Physician selected initial dose outside of range recommended for patients level of renal function - will discuss if the dose should be altered at this time.   [facility]  Calculated CrCl  Dose q12h  []  > 60 ml/min 500 mcg  [x]  40-60 ml/min 250 mcg  []  20-40 ml/min 125 mcg   2. Follow up QTc after the first 5 doses, renal function, electrolytes (K & Mg) daily x 3     days, dose adjustment, success of initiation and facilitate 1 week discharge supply as      clinically indicated.  3. Initiate Tikosyn education video (Call [phone] and ask for video # [phone]).  4. Place Enrollment Form on the chart for discharge supply of dofetilide.  5. Spoke with Dr. Haroldine Laws and decided to decrease Tikosyn dose to 250 mcg.  [place], Pharm.D., BCPS Clinical Pharmacist Pager: [phone] 03/21/2014 11:12 AM

## 2014-03-21 NOTE — Progress Notes (Signed)
Utilization Review Completed.Sharonica Kraszewski T2/14/2016  

## 2014-03-22 DIAGNOSIS — E119 Type 2 diabetes mellitus without complications: Secondary | ICD-10-CM

## 2014-03-22 DIAGNOSIS — I4891 Unspecified atrial fibrillation: Secondary | ICD-10-CM

## 2014-03-22 LAB — BASIC METABOLIC PANEL
Anion gap: 3 — ABNORMAL LOW (ref 5–15)
BUN: 32 mg/dL — ABNORMAL HIGH (ref 6–23)
CO2: 26 mmol/L (ref 19–32)
Calcium: 8.9 mg/dL (ref 8.4–10.5)
Chloride: 109 mmol/L (ref 96–112)
Creatinine, Ser: 0.99 mg/dL (ref 0.50–1.35)
GFR, EST NON AFRICAN AMERICAN: 85 mL/min — AB (ref >90)
Glucose, Bld: 113 mg/dL — ABNORMAL HIGH (ref 70–99)
Potassium: 4.2 mmol/L (ref 3.5–5.1)
Sodium: 138 mmol/L (ref 135–145)

## 2014-03-22 LAB — GLUCOSE, CAPILLARY
GLUCOSE-CAPILLARY: 116 mg/dL — AB (ref 70–99)
GLUCOSE-CAPILLARY: 127 mg/dL — AB (ref 70–99)
GLUCOSE-CAPILLARY: 161 mg/dL — AB (ref 70–99)
GLUCOSE-CAPILLARY: 167 mg/dL — AB (ref 70–99)
GLUCOSE-CAPILLARY: 186 mg/dL — AB (ref 70–99)
GLUCOSE-CAPILLARY: 199 mg/dL — AB (ref 70–99)
GLUCOSE-CAPILLARY: 360 mg/dL — AB (ref 70–99)
GLUCOSE-CAPILLARY: 89 mg/dL (ref 70–99)
GLUCOSE-CAPILLARY: 99 mg/dL (ref 70–99)
Glucose-Capillary: 95 mg/dL (ref 70–99)
Glucose-Capillary: 229 mg/dL — ABNORMAL HIGH (ref 70–99)
Glucose-Capillary: 390 mg/dL — ABNORMAL HIGH (ref 70–99)
Glucose-Capillary: 278 mg/dL — ABNORMAL HIGH (ref 70–99)
Glucose-Capillary: 230 mg/dL — ABNORMAL HIGH (ref 70–99)
Glucose-Capillary: 112 mg/dL — ABNORMAL HIGH (ref 70–99)
Glucose-Capillary: 63 mg/dL — ABNORMAL LOW (ref 70–99)
Glucose-Capillary: 101 mg/dL — ABNORMAL HIGH (ref 70–99)
Glucose-Capillary: 141 mg/dL — ABNORMAL HIGH (ref 70–99)

## 2014-03-22 LAB — THYROID ANTIBODIES: THYROID PEROXIDASE ANTIBODY: 64 [IU]/mL — AB (ref 0–34)

## 2014-03-22 LAB — AMIODARONE LEVEL
AMIODARONE LVL: NOT DETECTED ug/mL (ref 1.0–2.5)
N-Desethyl-Amiodarone: NOT DETECTED ug/mL (ref 1.0–2.5)

## 2014-03-22 LAB — MAGNESIUM: Magnesium: 1.9 mg/dL (ref 1.5–2.5)

## 2014-03-22 LAB — HEMOGLOBIN A1C
Hgb A1c MFr Bld: 6.7 % — ABNORMAL HIGH (ref 4.8–5.6)
Mean Plasma Glucose: 146 mg/dL

## 2014-03-22 MED ORDER — PROPRANOLOL HCL 20 MG PO TABS
20.0000 mg | ORAL_TABLET | Freq: Two times a day (BID) | ORAL | Status: DC
  Administered 2014-03-22 – 2014-03-25 (×7): 20 mg via ORAL
  Filled 2014-03-22 (×8): qty 1

## 2014-03-22 MED ORDER — INSULIN GLARGINE 100 UNIT/ML ~~LOC~~ SOLN
10.0000 [IU] | SUBCUTANEOUS | Status: DC
  Administered 2014-03-22: 10 [IU] via SUBCUTANEOUS
  Filled 2014-03-22: qty 0.1

## 2014-03-22 MED ORDER — DEXTROSE 50 % IV SOLN
INTRAVENOUS | Status: AC
  Filled 2014-03-22: qty 50

## 2014-03-22 MED ORDER — DOFETILIDE 500 MCG PO CAPS
500.0000 ug | ORAL_CAPSULE | Freq: Two times a day (BID) | ORAL | Status: DC
  Administered 2014-03-22 – 2014-03-28 (×13): 500 ug via ORAL
  Filled 2014-03-22 (×16): qty 1

## 2014-03-22 MED ORDER — INSULIN ASPART 100 UNIT/ML ~~LOC~~ SOLN
0.0000 [IU] | Freq: Three times a day (TID) | SUBCUTANEOUS | Status: DC
  Administered 2014-03-22: 7 [IU] via SUBCUTANEOUS
  Administered 2014-03-22: 5 [IU] via SUBCUTANEOUS
  Administered 2014-03-22: 2 [IU] via SUBCUTANEOUS

## 2014-03-22 MED ORDER — DEXTROSE 50 % IV SOLN
15.0000 mL | Freq: Once | INTRAVENOUS | Status: AC
Start: 2014-03-22 — End: 2014-03-22
  Administered 2014-03-22: 15 mL via INTRAVENOUS
  Filled 2014-03-22: qty 50

## 2014-03-22 MED ORDER — INSULIN ASPART 100 UNIT/ML ~~LOC~~ SOLN
0.0000 [IU] | Freq: Every day | SUBCUTANEOUS | Status: DC
  Administered 2014-03-22: 3 [IU] via SUBCUTANEOUS

## 2014-03-22 MED ORDER — INSULIN ASPART 100 UNIT/ML ~~LOC~~ SOLN
2.0000 [IU] | SUBCUTANEOUS | Status: DC
  Administered 2014-03-22: 4 [IU] via SUBCUTANEOUS

## 2014-03-22 MED ORDER — DILTIAZEM HCL 100 MG IV SOLR
5.0000 mg/h | INTRAVENOUS | Status: DC
  Administered 2014-03-22: 10 mg/h via INTRAVENOUS
  Administered 2014-03-22 – 2014-03-23 (×4): 15 mg/h via INTRAVENOUS
  Administered 2014-03-24: 10 mg/h via INTRAVENOUS
  Filled 2014-03-22: qty 100

## 2014-03-22 MED ORDER — INSULIN GLARGINE 100 UNIT/ML ~~LOC~~ SOLN
12.0000 [IU] | SUBCUTANEOUS | Status: DC
  Administered 2014-03-23 – 2014-03-30 (×8): 12 [IU] via SUBCUTANEOUS
  Filled 2014-03-22 (×9): qty 0.12

## 2014-03-22 MED ORDER — DOFETILIDE 250 MCG PO CAPS
250.0000 ug | ORAL_CAPSULE | Freq: Two times a day (BID) | ORAL | Status: DC
  Filled 2014-03-22 (×3): qty 1

## 2014-03-22 MED ORDER — INSULIN ASPART 100 UNIT/ML ~~LOC~~ SOLN
5.0000 [IU] | Freq: Three times a day (TID) | SUBCUTANEOUS | Status: DC
  Administered 2014-03-22 (×2): 5 [IU] via SUBCUTANEOUS

## 2014-03-22 NOTE — Evaluation (Signed)
Physical Therapy Evaluation Patient Details Name: Jordan [name] MRN: [id number] DOB: 08/10/1950 Today's Date: 03/22/2014   History of Present Illness  Pt adm with A-fib with RVR. PMH - Lt CVA, DM, colon CA  Clinical Impression  Pt admitted with above diagnosis. Pt currently with functional limitations due to the deficits listed below (see PT Problem List).  Pt will benefit from skilled PT to increase their independence and safety with mobility to allow discharge to the venue listed below.  Expect pt will be able to return home to prior living situation.     Follow Up Recommendations No PT follow up;Supervision - Intermittent    Equipment Recommendations  None recommended by PT    Recommendations for Other Services       Precautions / Restrictions Precautions Precautions: Fall      Mobility  Bed Mobility Overal bed mobility: Modified Independent             General bed mobility comments: Incr time and effort  Transfers Overall transfer level: Needs assistance Equipment used: None Transfers: Sit to/from Stand Sit to Stand: Min assist         General transfer comment: Assist for balance.  Ambulation/Gait Ambulation/Gait assistance: Min assist;Min guard Ambulation Distance (Feet): 225 Feet Assistive device: None Gait Pattern/deviations: Decreased dorsiflexion - right   Gait velocity interpretation: Below normal speed for age/gender General Gait Details: Circumduction of RLE. Assist for balance due to unsteadiness.  Stairs            Wheelchair Mobility    Modified Rankin (Stroke Patients Only)       Balance Overall balance assessment: Needs assistance Sitting-balance support: No upper extremity supported;Feet supported Sitting balance-Leahy Scale: Good     Standing balance support: No upper extremity supported Standing balance-Leahy Scale: Fair                               Pertinent Vitals/Pain Pain Assessment: No/denies pain     Home Living Family/patient expects to be discharged to:: Private residence Living Arrangements: Alone Available Help at Discharge: Family;Available PRN/intermittently Type of Home: Mobile home Home Access: Stairs to enter Entrance Stairs-Rails: Right;Left;Can reach both Entrance Stairs-Number of Steps: 3-5 Home Layout: One level Home Equipment: Walker - 2 wheels;Cane - single point      Prior Function Level of Independence: Independent         Comments: Hasn't been using cane or walker     Hand Dominance        Extremity/Trunk Assessment   Upper Extremity Assessment: RUE deficits/detail RUE Deficits / Details: Weakness due to prior CVA         Lower Extremity Assessment: RLE deficits/detail RLE Deficits / Details: Weakness due to prior CVA       Communication   Communication: No difficulties  Cognition Arousal/Alertness: Awake/alert Behavior During Therapy: WFL for tasks assessed/performed Overall Cognitive Status: Within Functional Limits for tasks assessed                      General Comments      Exercises        Assessment/Plan    PT Assessment Patient needs continued PT services  PT Diagnosis Difficulty walking;Generalized weakness   PT Problem List Decreased strength;Decreased activity tolerance;Decreased balance;Decreased mobility  PT Treatment Interventions DME instruction;Balance training;Gait training;Functional mobility training;Therapeutic activities;Patient/family education;Therapeutic exercise   PT Goals (Current goals can be found in the  Care Plan section) Acute Rehab PT Goals Patient Stated Goal: Return home PT Goal Formulation: With patient Time For Goal Achievement: 03/29/14 Potential to Achieve Goals: Good    Frequency Min 3X/week   Barriers to discharge        Co-evaluation               End of Session Equipment Utilized During Treatment: Gait belt Activity Tolerance: Patient tolerated treatment  well Patient left: in chair;with call bell/phone within reach;with family/visitor present Nurse Communication: Mobility status         Time: 9798-9211 PT Time Calculation (min) (ACUTE ONLY): 16 min   Charges:   PT Evaluation $Initial PT Evaluation Tier I: 1 Procedure     PT G Codes:        Agnes Probert 28-Mar-2014, 2:27 PM  [facility] PT [phone]

## 2014-03-22 NOTE — Progress Notes (Signed)
Pt converted to Afib RVR 140-170's, BP 120/70's, pt asymptomatic, confirmed by EKG. Dr Irish Lack paged, transfer on hold and Cardizem drip and Tikosyn restarted.

## 2014-03-22 NOTE — Progress Notes (Signed)
Progress Note  Subjective:    No complaints this AM. NSR @ 75. CBG's improved this AM.   Objective:   Temp:  [97.3 F (36.3 C)-98.8 F (37.1 C)] 98.2 F (36.8 C) (02/15 0400) Pulse Rate:  [48-160] 72 (02/15 0600) Resp:  [12-28] 16 (02/15 0600) BP: (79-137)/(47-119) 130/66 mmHg (02/15 0600) SpO2:  [90 %-97 %] 94 % (02/15 0600) Last BM Date: 03/20/14  Filed Weights   03/19/14 2055  Weight: 139 lb 1.8 oz (63.1 kg)    Intake/Output Summary (Last 24 hours) at 03/22/14 0706 Last data filed at 03/22/14 0600  Gross per 24 hour  Intake 1668.26 ml  Output   1550 ml  Net 118.26 ml    Telemetry: NSR  Physical Exam: General: Thin white male, alert, cooperative, NAD. HEENT: PERRL, EOMI. Moist mucus membranes Neck: Supple, no carotid bruits, no lymphadenopathy.  Lungs: Clear to ascultation bilaterally, normal work of respiration, no wheezes, rales, rhonchi Heart: Tachycardic, irregular, no murmurs, gallops, or rubs. Abdomen: Thin, soft, non-tender, non-distended, BS +. Palpable abdominal aorta w/ audible pulsation on auscultation.  Extremities: No cyanosis, clubbing. Trace pitting edema.  Neurologic: Alert & oriented x3, cranial nerves II-XII intact, weak in RUE. + word-finding difficulty   Lab Results:  Basic Metabolic Panel:  Recent Labs Lab 03/19/14 2307 03/20/14 0304 03/21/14 0042 03/21/14 1030 03/22/14 0305  NA 138 137 134*  --  138  K 4.2 4.9 3.9  --  4.2  CL 106 106 105  --  109  CO2 24 19 22   --  26  GLUCOSE 235* 386* 221*  --  113*  BUN 18 20 39*  --  32*  CREATININE 0.93 1.01 1.31  --  0.99  CALCIUM 9.2 9.4 9.9  --  8.9  MG 1.9  --   --  2.3 1.9    Liver Function Tests:  Recent Labs Lab 03/19/14 2307  AST 31  ALT 30  ALKPHOS 112  BILITOT 1.2  PROT 6.6  ALBUMIN 3.4*    CBC:  Recent Labs Lab 03/19/14 2307 03/20/14 0304 03/20/14 0945  WBC 15.6* 6.3 11.9*  HGB 15.2 12.4* 11.5*  HCT 47.1 37.5* 33.6*  MCV 91.8 90.8 88.4  PLT 201 266  336     Radiology: US Soft Tissue Head/neck  03/20/2014   CLINICAL DATA:  Thyroiditis.  EXAM: THYROID ULTRASOUND  TECHNIQUE: Ultrasound examination of the thyroid gland and adjacent soft tissues was performed.  COMPARISON:  None.  FINDINGS: Right thyroid lobe  Measurements: 5.6 x 2.2 x 1.8 cm. Mildly heterogeneous parenchymal echogenicity. No discrete nodules or masses visualized.  Left thyroid lobe  Measurements: 5.6 x 2.3 x 1.8 cm. Mildly heterogeneous parenchymal echogenicity. No discrete nodules or masses visualized.  Isthmus  Thickness: 4 mm.  No nodules visualized.  Lymphadenopathy  None visualized.  IMPRESSION: Mildly enlarged thyroid. No discrete thyroid nodules or masses visualized.   Electronically Signed   By: Earle Gell M.D.   On: 03/20/2014 16:41     ECG: NSR, normal QTc   Medications:   Scheduled Medications: . antiseptic oral rinse  7 mL Mouth Rinse BID  . dabigatran  150 mg Oral BID  . folic acid  1 mg Oral Daily  . insulin aspart  2-6 Units Subcutaneous 6 times per day  . insulin glargine  10 Units Subcutaneous Q24H  . propylthiouracil  100 mg Oral Q6H  . sodium chloride  3 mL Intravenous Q12H    Infusions: . insulin (  NOVOLIN-R) infusion Stopped (03/22/14 0300)    PRN Medications: sodium chloride, acetaminophen, metoprolol, ondansetron (ZOFRAN) IV, sodium chloride   Assessment and Plan:  64 y/o M w/ PMHx of DM type II, atrial fibrillation, CVA and thyroid dysfunction 2/2 to ?amiodarone administration, admitted on 03/19/14 w/thyroid storm & atrial fibrillation w/ RVR.   Atrial Fibrillation w/ RVR: Resolved, now NSR, converted to junctional bradycardia w/ 2-3 seconds post termination pause around 6:30 PM last night. 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 Previous report of [posive thyrotropin Ab's at [facility]. Seen in clinic on 03/19/14, found to have HR in the 140-160 range. Takes Sotalol 40 mg bid at home. TSH on admit 0.006, thyroid  US on 03/20/14 showed enlarged thyroid w/ no discrete nodules. Methimazole started here and changed to PTU yesterday. 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 Tikosyn, Cardizem discontinued yesterday. -Continue PTU 100 mg q6h -Dr. Forde Dandy, endocrine to see today -Start Propranolol 20 mg bid per Dr. Forde Dandy; hold for HR <60 -Lopressor 5 mg IV q2h prn for tachycardia -Continue Pradaxa 150 mg bid - has been on for months -ECHO pending  Thyrotoxicosis: As above. TSH 0.006, fT4 4.25. US showed mildly enlarged thyroid. On PTU, endocrine to see.  -Continue PTU -Endocrine consult  3) DM type II: Steroids discontinued yesterday, off insulin gtt. CBG's as follows:   Recent Labs Lab 03/21/14 2354 03/22/14 0113 03/22/14 0153 03/22/14 0210 03/22/14 0317  GLUCAP 161* 89 63* 112* 101*  -Continue Lantus 10 units daily -ISS-S + HS coverage -May require mealtime coverage as he is on this at home   Previous CVA with RUE weakness: Stable.  -Continue Pradaxa -PT eval     Natasha Bence, MD PGY-2 Internal Medicine Pager: [phone]   I have examined the patient and reviewed assessment and plan and discussed with patient.  Agree with above as stated.  Patient converted to NSR last night. Diltiazem and Tikosyn were stopped.  Patient maintaining NSR.  I discussed the case with Dr. Rayann Heman who felt it was likely the Tikosyn that helped convert the patient.  For now, we will restart the Tikosyn at 500 mcg BID.  Stroke prevention with Pradaxa.    Dr. Forde Dandy to see the patient today.  Agree with agressive beta blockade.  Thyroid suppression therapy for now with PTU.    Plan to transfer to [facility] stepdown if OK with endocrine.  Daily ECG. Follow QT interval.   Kimanh Templeman S.

## 2014-03-22 NOTE — Care Management Note (Addendum)
    Page 1 of 1   03/30/2014     3:53:09 PM CARE MANAGEMENT NOTE 03/30/2014  Patient:  Jordan [name], Jordan [name]   Account Number:  [id number]  Date Initiated:  03/22/2014  Documentation initiated by:  Elissa Hefty  Subjective/Objective Assessment:   adm w at fib     Action/Plan:   lives alone, pcp dr Sharlet Salina tate   Anticipated DC Date:  03/30/2014   Anticipated DC Plan:  [place]  CM consult  Medication Assistance      Choice offered to / List presented to:          [facility] arranged  [place] - 11 Patient Refused      Status of service:  Completed, signed off Medicare Important Message given?   (If response is "NO", the following Medicare IM given date fields will be blank) Date Medicare IM given:   Medicare IM given by:   Date Additional Medicare IM given:   Additional Medicare IM given by:    Discharge Disposition:  HOME/SELF CARE  Per UR Regulation:  Reviewed for med. necessity/level of care/duration of stay  If discussed at [place] of Stay Meetings, dates discussed:    Comments:  03/30/14- 1200- Marvetta Gibbons RN, BSN [phone] Pt did not go home on Tikosyn- due to prolonged QTc-  no further CM needs noted  2/19  1009 debbie dowell rn,bsn spoke w pt. phy ther had rec hhpt. pt has cane and walker. he has had hhc in past and very nicely refused. states he feels he will be fine at disch and declines hhc. explained if he changed his mind i will be glad to arrange.  2/15 1127a debbie dowell rn,bsn spoke w pt. gave him copay card for tikosyn and for pradaxa. he uses [facility] in [place] but they do not have tikosyn and are not able to order. [facility] and [facility] in [place] have in stock and pt lives in [place] so he will get tikosyn at one of those pharmacies.

## 2014-03-22 NOTE — Progress Notes (Signed)
  Echocardiogram 2D Echocardiogram has been performed.  Jordan [name] 03/22/2014, 10:32 AM

## 2014-03-22 NOTE — Progress Notes (Signed)
Patient has maintained NSR throughout the day.  Will transfer to stepdown/tele ([facility]).  Will need EP evaluation with Dr. Rayann Heman to determine whether Tikosyn is needed long term, or whether thyroid suppression and beta blocker would be sufficient.  Case was discussed with Dr. Forde Dandy over the weekend.  He will need endocrinology f/u as well.   Added meal coverage for increased glucose.    Jettie Booze, MD

## 2014-03-23 DIAGNOSIS — E0581 Other thyrotoxicosis with thyrotoxic crisis or storm: Secondary | ICD-10-CM

## 2014-03-23 LAB — MAGNESIUM: MAGNESIUM: 1.9 mg/dL (ref 1.5–2.5)

## 2014-03-23 LAB — GLUCOSE, CAPILLARY
GLUCOSE-CAPILLARY: 287 mg/dL — AB (ref 70–99)
Glucose-Capillary: 281 mg/dL — ABNORMAL HIGH (ref 70–99)
Glucose-Capillary: 320 mg/dL — ABNORMAL HIGH (ref 70–99)
Glucose-Capillary: 276 mg/dL — ABNORMAL HIGH (ref 70–99)
Glucose-Capillary: 239 mg/dL — ABNORMAL HIGH (ref 70–99)
Glucose-Capillary: 136 mg/dL — ABNORMAL HIGH (ref 70–99)
Glucose-Capillary: 267 mg/dL — ABNORMAL HIGH (ref 70–99)

## 2014-03-23 LAB — BASIC METABOLIC PANEL
Anion gap: 6 (ref 5–15)
BUN: 23 mg/dL (ref 6–23)
CALCIUM: 9 mg/dL (ref 8.4–10.5)
CO2: 23 mmol/L (ref 19–32)
Chloride: 104 mmol/L (ref 96–112)
Creatinine, Ser: 0.87 mg/dL (ref 0.50–1.35)
GFR calc Af Amer: >90 mL/min (ref >90)
GFR, EST NON AFRICAN AMERICAN: 90 mL/min — AB (ref >90)
GLUCOSE: 279 mg/dL — AB (ref 70–99)
Potassium: 4.6 mmol/L (ref 3.5–5.1)
Sodium: 133 mmol/L — ABNORMAL LOW (ref 135–145)

## 2014-03-23 LAB — T3, FREE: T3, Free: 3.2 pg/mL (ref 2.0–4.4)

## 2014-03-23 MED ORDER — MAGNESIUM SULFATE 2 GM/50ML IV SOLN
2.0000 g | Freq: Once | INTRAVENOUS | Status: AC
  Administered 2014-03-23: 2 g via INTRAVENOUS
  Filled 2014-03-23: qty 50

## 2014-03-23 MED ORDER — SODIUM CHLORIDE 0.9 % IJ SOLN: 3.0000 mL | INTRAMUSCULAR | Status: DC | PRN

## 2014-03-23 MED ORDER — SODIUM CHLORIDE 0.9 % IJ SOLN: 3.0000 mL | Freq: Two times a day (BID) | INTRAMUSCULAR | Status: DC

## 2014-03-23 MED ORDER — INSULIN ASPART 100 UNIT/ML ~~LOC~~ SOLN
0.0000 [IU] | Freq: Three times a day (TID) | SUBCUTANEOUS | Status: DC
  Administered 2014-03-23: 2 [IU] via SUBCUTANEOUS
  Administered 2014-03-23: 8 [IU] via SUBCUTANEOUS
  Administered 2014-03-23: 5 [IU] via SUBCUTANEOUS
  Administered 2014-03-24 (×2): 8 [IU] via SUBCUTANEOUS
  Administered 2014-03-24: 15 [IU] via SUBCUTANEOUS

## 2014-03-23 MED ORDER — SODIUM CHLORIDE 0.9 % IV SOLN: 250.0000 mL | INTRAVENOUS | Status: DC

## 2014-03-23 MED ORDER — INSULIN ASPART 100 UNIT/ML ~~LOC~~ SOLN
7.0000 [IU] | Freq: Three times a day (TID) | SUBCUTANEOUS | Status: DC
  Administered 2014-03-23 – 2014-03-30 (×22): 7 [IU] via SUBCUTANEOUS

## 2014-03-23 MED ORDER — INSULIN ASPART 100 UNIT/ML ~~LOC~~ SOLN
0.0000 [IU] | Freq: Every day | SUBCUTANEOUS | Status: DC
  Administered 2014-03-23 – 2014-03-24 (×2): 3 [IU] via SUBCUTANEOUS

## 2014-03-23 NOTE — Progress Notes (Addendum)
Patient needs to be cardioverted on 02/17.  Anesthesia might be able to do it at 9 AM, but endoscopy does not have any slots available.  Therefore, the plan is to call [phone] for anesthesia charge or [phone] at the OR front desk, to try to get it added on and get it done at the bedside.  If it can be done at the bedside, before 10 AM, Dr. Beau Fanny can do it.  Orders written, and RN is aware.  Rosaria Ferries, PA-C 03/23/2014 3:07 PM Beeper [phone]

## 2014-03-23 NOTE — Progress Notes (Addendum)
Progress Note  Subjective:    Converted back into atrial fibrillation w/ RVR yesterday evening, rate 140's this AM. No complaints. No chest pain, SOB, dizziness, lightheadedness, or palpitations.   Objective:   Temp:  [97.8 F (36.6 C)-98.5 F (36.9 C)] 98.3 F (36.8 C) (02/16 0400) Pulse Rate:  [42-149] 109 (02/16 0600) Resp:  [14-33] 33 (02/16 0600) BP: (94-154)/(58-97) 108/58 mmHg (02/16 0600) SpO2:  [94 %-97 %] 96 % (02/16 0600) Last BM Date: 03/21/14  Filed Weights   03/19/14 2055  Weight: 139 lb 1.8 oz (63.1 kg)    Intake/Output Summary (Last 24 hours) at 03/23/14 0701 Last data filed at 03/23/14 0600  Gross per 24 hour  Intake    900 ml  Output   4050 ml  Net  -3150 ml    Telemetry: Atrial Fibrillation w/ RVR  Physical Exam: General: Thin white male, alert, cooperative, NAD. HEENT: PERRL, EOMI. Moist mucus membranes Neck: Supple, no carotid bruits, no lymphadenopathy.  Lungs: Clear to ascultation bilaterally, normal work of respiration, no wheezes, rales, rhonchi Heart: Tachycardic, irregular, no murmurs, gallops, or rubs. Abdomen: Thin, soft, non-tender, non-distended, BS +. Palpable abdominal aorta w/ audible pulsation on auscultation.  Extremities: No cyanosis, clubbing, or edema. Neurologic: Alert & oriented x3, cranial nerves II-XII intact, weak in RUE. + word-finding difficulty   Lab Results:  Basic Metabolic Panel:  Recent Labs Lab 03/21/14 0042 03/21/14 1030 03/22/14 0305 03/23/14 0226  NA 134*  --  138 133*  K 3.9  --  4.2 4.6  CL 105  --  109 104  CO2 22  --  26 23  GLUCOSE 221*  --  113* 279*  BUN 39*  --  32* 23  CREATININE 1.31  --  0.99 0.87  CALCIUM 9.9  --  8.9 9.0  MG  --  2.3 1.9 1.9    Liver Function Tests:  Recent Labs Lab 03/19/14 2307  AST 31  ALT 30  ALKPHOS 112  BILITOT 1.2  PROT 6.6  ALBUMIN 3.4*    CBC:  Recent Labs Lab 03/19/14 2307 03/20/14 0304 03/20/14 0945  WBC 15.6* 6.3 11.9*  HGB 15.2  12.4* 11.5*  HCT 47.1 37.5* 33.6*  MCV 91.8 90.8 88.4  PLT 201 266 336      Medications:   Scheduled Medications: . antiseptic oral rinse  7 mL Mouth Rinse BID  . dabigatran  150 mg Oral BID  . dofetilide  500 mcg Oral BID  . folic acid  1 mg Oral Daily  . insulin aspart  0-5 Units Subcutaneous QHS  . insulin aspart  0-9 Units Subcutaneous TID WC  . insulin aspart  5 Units Subcutaneous TID WC  . insulin glargine  12 Units Subcutaneous Q24H  . propranolol  20 mg Oral BID  . propylthiouracil  100 mg Oral Q6H  . sodium chloride  3 mL Intravenous Q12H    Infusions: . diltiazem (CARDIZEM) infusion 15 mg/hr (03/23/14 0528)  . insulin (NOVOLIN-R) infusion Stopped (03/22/14 0300)    PRN Medications: sodium chloride, acetaminophen, metoprolol, ondansetron (ZOFRAN) IV, sodium chloride   Assessment and Plan:  64 y/o M w/ PMHx of DM type II, atrial fibrillation, CVA and thyroid dysfunction 2/2 to ?amiodarone administration, admitted on 03/19/14 w/thyroid storm & atrial fibrillation w/ RVR.   Atrial Fibrillation w/ RVR: In NSR all day yesterday, converted back into atrial fibrillation w/ RVR around 6 PM. ECHO performed yesterday showed EF of 50-55%, mild to moderate mitral  regurgitation, elevated PA pressure of 42 mm Hg, and trivial pericardial effusion. Left pleural effusion also seen.  -Restarted Tikosyn 500 mg bid + Cardizem gtt -Continue PTU 100 mg q6h -Continue Propranolol 20 mg bid; hold for HR <60 -Lopressor 5 mg IV q2h prn for tachycardia -Continue Pradaxa 150 mg bid  Thyrotoxicosis: As above. TSH 0.006, fT4 elevated at 4.25, fT3 normal. [place] on 03/20/14 showed mildly enlarged thyroid. On PTU. -Continue PTU -Endocrine follow up on discharge  3) DM type II: CBG's elevated overnight. Trend as follows:    Recent Labs Lab 03/22/14 0653 03/22/14 0729 03/22/14 1131 03/22/14 1732 03/22/14 2107  GLUCAP 199* 167* 281* 320* 287*  -Increase Lantus to 12 units daily -Increase to  ISS-M + HS coverage -Increase mealtime coverage to 7 units tid   Previous CVA with RUE weakness: Stable.  -Continue Pradaxa -PT eval     Natasha Bence, MD PGY-2 Internal Medicine Pager: [phone]   I have examined the patient and reviewed assessment and plan and discussed with patient.  Agree with above as stated.  Continue Tikosyn.  Hopefully he will convert to NSR.  Plan for DCCV tomorrow if he does not convert to NSR.  AFib returned yesterday after Tikosyn was stopped.  I suspect it was the Tikosyn that converted him as opposed to the PTU.  He will continue to be on IV cardizem.  His rate control is suboptimal.  IV lopressor was not helping.  Long term rate control is not a good option.  Will have to try for rhythm control.   Lavaeh Bau S.

## 2014-03-24 LAB — BASIC METABOLIC PANEL
Anion gap: 6 (ref 5–15)
BUN: 21 mg/dL (ref 6–23)
CO2: 22 mmol/L (ref 19–32)
CREATININE: 1 mg/dL (ref 0.50–1.35)
Calcium: 9.1 mg/dL (ref 8.4–10.5)
Chloride: 103 mmol/L (ref 96–112)
GFR calc Af Amer: >90 mL/min (ref >90)
GFR calc non Af Amer: 78 mL/min — ABNORMAL LOW (ref >90)
Glucose, Bld: 396 mg/dL — ABNORMAL HIGH (ref 70–99)
Potassium: 4.8 mmol/L (ref 3.5–5.1)
Sodium: 131 mmol/L — ABNORMAL LOW (ref 135–145)

## 2014-03-24 LAB — GLUCOSE, CAPILLARY
GLUCOSE-CAPILLARY: 273 mg/dL — AB (ref 70–99)
Glucose-Capillary: 292 mg/dL — ABNORMAL HIGH (ref 70–99)

## 2014-03-24 LAB — MAGNESIUM: MAGNESIUM: 2 mg/dL (ref 1.5–2.5)

## 2014-03-24 MED ORDER — DILTIAZEM HCL 100 MG IV SOLR
5.0000 mg/h | INTRAVENOUS | Status: DC
  Administered 2014-03-24 – 2014-03-25 (×3): 15 mg/h via INTRAVENOUS
  Filled 2014-03-24: qty 100

## 2014-03-24 MED ORDER — DILTIAZEM LOAD VIA INFUSION
10.0000 mg | Freq: Once | INTRAVENOUS | Status: AC
  Administered 2014-03-24: 10 mg via INTRAVENOUS
  Filled 2014-03-24: qty 10

## 2014-03-24 MED ORDER — DILTIAZEM HCL ER COATED BEADS 120 MG PO CP24
120.0000 mg | ORAL_CAPSULE | Freq: Every day | ORAL | Status: DC
  Administered 2014-03-24: 120 mg via ORAL
  Filled 2014-03-24: qty 1

## 2014-03-24 NOTE — Progress Notes (Addendum)
Pt converted back into NSR at 0250 03/24/14. HR 60. Decreased Cardizem to 10 at this time.

## 2014-03-24 NOTE — Progress Notes (Signed)
Inpatient Diabetes Program Recommendations  AACE/ADA: New Consensus Statement on Inpatient Glycemic Control (2013)  Target Ranges:  Prepandial:   less than 140 mg/dL      Peak postprandial:   less than 180 mg/dL (1-2 hours)      Critically ill patients:  140 - 180 mg/dL  Results for Richoux, Daeron W (MRN [id number]) as of 03/24/2014 11:36  Ref. Range 03/24/2014 02:30  Glucose Latest Range: 70-99 mg/dL 396 (H)   Inpatient Diabetes Program Recommendations Insulin - Basal: consider increasing Lantus to 15 units am and 10 units pm Thank you  Raoul Pitch BSN, RN,CDE Inpatient Diabetes Coordinator [phone] (team pager)

## 2014-03-24 NOTE — Progress Notes (Signed)
Progress Note  Subjective:    Converted back into atrial fibrillation w/ RVR on 2/15, converted to NSR this AM. No chest pain, SOB, dizziness, lightheadedness, or palpitations.   Objective:   Temp:  [97.2 F (36.2 C)-98.9 F (37.2 C)] 98.5 F (36.9 C) (02/17 0800) Pulse Rate:  [42-150] 69 (02/17 0800) Resp:  [14-28] 18 (02/17 0800) BP: (92-139)/(52-105) 139/85 mmHg (02/17 0800) SpO2:  [92 %-99 %] 98 % (02/17 0800) Last BM Date: 03/22/14  Filed Weights   03/19/14 2055  Weight: 139 lb 1.8 oz (63.1 kg)    Intake/Output Summary (Last 24 hours) at 03/24/14 0855 Last data filed at 03/24/14 0800  Gross per 24 hour  Intake   1778 ml  Output   2950 ml  Net  -1172 ml    Telemetry: Atrial Fibrillation w/ RVR  Physical Exam: General: Thin white male, alert, cooperative, NAD. HEENT: PERRL, EOMI. Moist mucus membranes Neck: Supple, no carotid bruits, no lymphadenopathy.  Lungs: Clear to ascultation bilaterally, normal work of respiration, no wheezes, rales, rhonchi Heart: RRR S1 S2 Abdomen: Thin, soft, non-tender, non-distended, BS +. Palpable abdominal aorta w/ audible pulsation on auscultation.  Extremities: No cyanosis, clubbing, or edema. Neurologic: Alert & oriented x3, cranial nerves II-XII intact, weak in RUE. + word-finding difficulty   Lab Results:  Basic Metabolic Panel:  Recent Labs Lab 03/22/14 0305 03/23/14 0226 03/24/14 0230  NA 138 133* 131*  K 4.2 4.6 4.8  CL 109 104 103  CO2 26 23 22   GLUCOSE 113* 279* 396*  BUN 32* 23 21  CREATININE 0.99 0.87 1.00  CALCIUM 8.9 9.0 9.1  MG 1.9 1.9 2.0    Liver Function Tests:  Recent Labs Lab 03/19/14 2307  AST 31  ALT 30  ALKPHOS 112  BILITOT 1.2  PROT 6.6  ALBUMIN 3.4*    CBC:  Recent Labs Lab 03/19/14 2307 03/20/14 0304 03/20/14 0945  WBC 15.6* 6.3 11.9*  HGB 15.2 12.4* 11.5*  HCT 47.1 37.5* 33.6*  MCV 91.8 90.8 88.4  PLT 201 266 336      Medications:   Scheduled Medications: .  dabigatran  150 mg Oral BID  . dofetilide  500 mcg Oral BID  . folic acid  1 mg Oral Daily  . insulin aspart  0-15 Units Subcutaneous TID WC  . insulin aspart  0-5 Units Subcutaneous QHS  . insulin aspart  7 Units Subcutaneous TID WC  . insulin glargine  12 Units Subcutaneous Q24H  . propranolol  20 mg Oral BID  . propylthiouracil  100 mg Oral Q6H  . sodium chloride  3 mL Intravenous Q12H    Infusions: . diltiazem (CARDIZEM) infusion 5 mg/hr (03/24/14 0800)    PRN Medications: sodium chloride, acetaminophen, metoprolol, ondansetron (ZOFRAN) IV, sodium chloride   Assessment and Plan:  64 y/o M w/ PMHx of DM type II, atrial fibrillation, CVA and thyroid dysfunction 2/2 to ?amiodarone administration, admitted on 03/19/14 w/thyroid storm & atrial fibrillation w/ RVR.   Atrial Fibrillation w/ RVR: Back in NSR after Tikosyn restarted.  Continue Tikosyn at discharge.  Spoke to Dr. Rayann Heman who recommended f/u in the AFib clinic with Roderic Palau.  -Continue Tikosyn 500 mg bid , switch Cardizem to oral -Continue PTU 100 mg q6h -Continue Propranolol 20 mg bid; hold for HR <60 -Lopressor 5 mg IV q2h prn for tachycardia was stopped due to lack of response -Continue Pradaxa 150 mg bid  Thyrotoxicosis: As above. TSH 0.006, fT4 elevated at  4.25, fT3 normal. [place] on 03/20/14 showed mildly enlarged thyroid. On PTU. -Continue PTU -Endocrine follow up on discharge with Dr. Forde Dandy  3) DM type II:     -Increased to ISS-M + HS coverage -Increased mealtime coverage to 7 units tid   Previous CVA with RUE weakness: Stable.  -Continue Pradaxa -PT eval    Transfer to tele today. Anticipate d/c tomorrow after dose #6 of Tikosyn.  Jordan Wellen S.

## 2014-03-24 NOTE — Progress Notes (Signed)
Paged MD Elias Else about pt having pauses in HR. Pt is currently in Afib at 140-150's with frequent pauses with HR 30-40's. Pt is having runs of Vtach, 8 beats being the most. MD aware, not new. MD stated he thought pt was attempting to convert to NSR. Pt is asymptomatic with HR changes. MD wants to keep an eye on pt and call back if pauses are greater than 7 secs.

## 2014-03-24 NOTE — Progress Notes (Signed)
Physical Therapy Treatment Patient Details Name: Jordan [name] MRN: [id number] DOB: 1951-02-04 Today's Date: 03/24/2014    History of Present Illness Pt adm with A-fib with RVR. PMH - Lt CVA, DM, colon CA    PT Comments    Pt continues to struggle to clear rt foot with amb due to old CVA. Had previously asked pt if he ever had a brace for rt foot drop and he had denied. Told him today he could benefit from AFO and pt remembered he has a brace at home but only used a few times. Instructed pt that he needs to use his AFO at home for all amb. Also recommend he use his walker until he is stronger.  Follow Up Recommendations  Home health PT;Supervision - Intermittent     Equipment Recommendations  None recommended by PT    Recommendations for Other Services       Precautions / Restrictions Precautions Precautions: Fall    Mobility  Bed Mobility                  Transfers Overall transfer level: Needs assistance Equipment used: Rolling walker (2 wheeled);None Transfers: Sit to/from Stand Sit to Stand: Supervision         General transfer comment: supervision for safety and verbal cues for hand placement when standing with walker.  Ambulation/Gait Ambulation/Gait assistance: Min assist;Min guard Ambulation Distance (Feet): 350 Feet Assistive device: None;Rolling walker (2 wheeled) Gait Pattern/deviations: Decreased dorsiflexion - right   Gait velocity interpretation: Below normal speed for age/gender General Gait Details: Pt using circumduction of RLE to try to clear rt foot due to foot drop. Pt caught rt toe several times and required min A to maintain balance. Pt improved with walker but still with problems clearing rt foot.   Stairs            Wheelchair Mobility    Modified Rankin (Stroke Patients Only)       Balance   Sitting-balance support: No upper extremity supported Sitting balance-Leahy Scale: Good     Standing balance support: No  upper extremity supported Standing balance-Leahy Scale: Fair                      Cognition Arousal/Alertness: Awake/alert Behavior During Therapy: WFL for tasks assessed/performed Overall Cognitive Status: No family/caregiver present to determine baseline cognitive functioning Area of Impairment: Safety/judgement         Safety/Judgement: Decreased awareness of deficits;Decreased awareness of safety     General Comments: Pt with rt foot dragging due to old CVA and pt not aware of high fall risk with this.  Expect he has some mild cognitive deficits from CVA    Exercises      General Comments        Pertinent Vitals/Pain Pain Assessment: No/denies pain    Home Living                      Prior Function            PT Goals (current goals can now be found in the care plan section) Progress towards PT goals: Progressing toward goals    Frequency  Min 3X/week    PT Plan Discharge plan needs to be updated    Co-evaluation             End of Session Equipment Utilized During Treatment: Gait belt Activity Tolerance: Patient tolerated treatment well Patient left: in chair;with call  bell/phone within reach     Time: 0900-0919 PT Time Calculation (min) (ACUTE ONLY): 19 min  Charges:  $Gait Training: 8-22 mins                    G Codes:      Bertis Hustead 04-07-2014, 9:32 AM  [facility] PT [phone]

## 2014-03-24 NOTE — Progress Notes (Signed)
Patient back in atrial fibrillation, rate 120-160's. Started back on Cardizem gtt. Will keep in CCU for now. Discussed w/ Dr. Irish Lack.  Signed: Luanne Bras, MD 03/24/2014 2:35 PM

## 2014-03-25 ENCOUNTER — Inpatient Hospital Stay (HOSPITAL_COMMUNITY): Payer: BC Managed Care – PPO | Admitting: Anesthesiology

## 2014-03-25 ENCOUNTER — Encounter (HOSPITAL_COMMUNITY): Payer: Self-pay | Admitting: *Deleted

## 2014-03-25 ENCOUNTER — Encounter (HOSPITAL_COMMUNITY)
Admission: EM | Disposition: A | Discharge status: Home / Self Care | Payer: Self-pay | Source: Other Acute Inpatient Hospital | Attending: Cardiovascular Disease

## 2014-03-25 ENCOUNTER — Inpatient Hospital Stay (HOSPITAL_COMMUNITY): Payer: BC Managed Care – PPO

## 2014-03-25 DIAGNOSIS — I480 Paroxysmal atrial fibrillation: Secondary | ICD-10-CM

## 2014-03-25 HISTORY — PX: CARDIOVERSION: SHX1299

## 2014-03-25 LAB — BASIC METABOLIC PANEL
Anion gap: 18 — ABNORMAL HIGH (ref 5–15)
BUN: 30 mg/dL — ABNORMAL HIGH (ref 6–23)
CALCIUM: 9.9 mg/dL (ref 8.4–10.5)
CO2: 15 mmol/L — ABNORMAL LOW (ref 19–32)
CREATININE: 1.31 mg/dL (ref 0.50–1.35)
Chloride: 99 mmol/L (ref 96–112)
GFR calc Af Amer: 65 mL/min — ABNORMAL LOW (ref >90)
GFR calc non Af Amer: 56 mL/min — ABNORMAL LOW (ref >90)
Glucose, Bld: 388 mg/dL — ABNORMAL HIGH (ref 70–99)
Potassium: 5.1 mmol/L (ref 3.5–5.1)
Sodium: 132 mmol/L — ABNORMAL LOW (ref 135–145)

## 2014-03-25 LAB — CBC
HCT: 43.7 % (ref 39.0–52.0)
Hemoglobin: 14.9 g/dL (ref 13.0–17.0)
MCH: 29.8 pg (ref 26.0–34.0)
MCHC: 34.1 g/dL (ref 30.0–36.0)
MCV: 87.4 fL (ref 78.0–100.0)
Platelets: 406 10*3/uL — ABNORMAL HIGH (ref 150–400)
RBC: 5 MIL/uL (ref 4.22–5.81)
RDW: 12.8 % (ref 11.5–15.5)
WBC: 15.2 10*3/uL — AB (ref 4.0–10.5)

## 2014-03-25 LAB — MAGNESIUM: Magnesium: 1.9 mg/dL (ref 1.5–2.5)

## 2014-03-25 LAB — GLUCOSE, CAPILLARY
Glucose-Capillary: 366 mg/dL — ABNORMAL HIGH (ref 70–99)
Glucose-Capillary: 159 mg/dL — ABNORMAL HIGH (ref 70–99)
Glucose-Capillary: 218 mg/dL — ABNORMAL HIGH (ref 70–99)

## 2014-03-25 LAB — T4, FREE: Free T4: 3.82 ng/dL — ABNORMAL HIGH (ref 0.80–1.80)

## 2014-03-25 LAB — TSH: TSH: 0.044 u[IU]/mL — AB (ref 0.350–4.500)

## 2014-03-25 SURGERY — CARDIOVERSION
Anesthesia: Monitor Anesthesia Care

## 2014-03-25 MED ORDER — PROPOFOL 10 MG/ML IV BOLUS
INTRAVENOUS | Status: DC | PRN
  Administered 2014-03-25: 60 mg via INTRAVENOUS

## 2014-03-25 MED ORDER — INSULIN GLARGINE 100 UNIT/ML ~~LOC~~ SOLN
10.0000 [IU] | Freq: Every day | SUBCUTANEOUS | Status: DC
  Administered 2014-03-25 – 2014-03-29 (×5): 10 [IU] via SUBCUTANEOUS
  Filled 2014-03-25 (×6): qty 0.1

## 2014-03-25 MED ORDER — INSULIN ASPART 100 UNIT/ML ~~LOC~~ SOLN
0.0000 [IU] | Freq: Three times a day (TID) | SUBCUTANEOUS | Status: DC
Start: 2014-03-25 — End: 2014-03-30
  Administered 2014-03-25: 20 [IU] via SUBCUTANEOUS
  Administered 2014-03-25: 4 [IU] via SUBCUTANEOUS
  Administered 2014-03-26 (×2): 7 [IU] via SUBCUTANEOUS
  Administered 2014-03-26: 15 [IU] via SUBCUTANEOUS
  Administered 2014-03-27: 11 [IU] via SUBCUTANEOUS
  Administered 2014-03-27: 3 [IU] via SUBCUTANEOUS
  Administered 2014-03-27: 4 [IU] via SUBCUTANEOUS
  Administered 2014-03-28: 20 [IU] via SUBCUTANEOUS
  Administered 2014-03-28 – 2014-03-29 (×2): 11 [IU] via SUBCUTANEOUS
  Administered 2014-03-29 – 2014-03-30 (×2): 4 [IU] via SUBCUTANEOUS
  Administered 2014-03-30: 20 [IU] via SUBCUTANEOUS

## 2014-03-25 MED ORDER — MAGNESIUM SULFATE 2 GM/50ML IV SOLN
2.0000 g | Freq: Once | INTRAVENOUS | Status: AC
  Administered 2014-03-25: 2 g via INTRAVENOUS
  Filled 2014-03-25: qty 50

## 2014-03-25 MED ORDER — SODIUM CHLORIDE 0.9 % IV SOLN
INTRAVENOUS | Status: DC | PRN
  Administered 2014-03-25: 13:00:00 via INTRAVENOUS

## 2014-03-25 MED ORDER — PROPRANOLOL HCL 60 MG PO TABS
60.0000 mg | ORAL_TABLET | Freq: Three times a day (TID) | ORAL | Status: DC
  Administered 2014-03-25 – 2014-03-30 (×15): 60 mg via ORAL
  Filled 2014-03-25 (×19): qty 1

## 2014-03-25 MED ORDER — PROPYLTHIOURACIL 50 MG PO TABS
150.0000 mg | ORAL_TABLET | Freq: Four times a day (QID) | ORAL | Status: DC
  Administered 2014-03-25 – 2014-03-30 (×19): 150 mg via ORAL
  Filled 2014-03-25 (×22): qty 3

## 2014-03-25 MED ORDER — LIDOCAINE HCL (CARDIAC) 20 MG/ML IV SOLN
INTRAVENOUS | Status: DC | PRN
  Administered 2014-03-25: 30 mg via INTRAVENOUS

## 2014-03-25 MED ORDER — INSULIN ASPART 100 UNIT/ML ~~LOC~~ SOLN
0.0000 [IU] | Freq: Every day | SUBCUTANEOUS | Status: DC
  Administered 2014-03-25: 2 [IU] via SUBCUTANEOUS
  Administered 2014-03-27: 4 [IU] via SUBCUTANEOUS
  Administered 2014-03-28: 5 [IU] via SUBCUTANEOUS

## 2014-03-25 MED ORDER — IOHEXOL 300 MG/ML  SOLN
80.0000 mL | Freq: Once | INTRAMUSCULAR | Status: AC | PRN
  Administered 2014-03-25: 80 mL via INTRAVENOUS

## 2014-03-25 NOTE — Anesthesia Preprocedure Evaluation (Signed)
Anesthesia Evaluation  Patient identified by MRN, date of birth, ID band Patient awake    Reviewed: Allergy & Precautions, NPO status , Patient's Chart, lab work & pertinent test results, reviewed documented beta blocker date and time   Airway        Dental   Pulmonary          Cardiovascular + dysrhythmias Atrial Fibrillation  EF 50-55%   Neuro/Psych CVA    GI/Hepatic negative GI ROS, Neg liver ROS,   Endo/Other  diabetes, Poorly Controlled, Type 2Hyperthyroidism (admitted in storm, being RX)   Renal/GU negative Renal ROS     Musculoskeletal   Abdominal   Peds  Hematology   Anesthesia Other Findings   Reproductive/Obstetrics                             Anesthesia Physical Anesthesia Plan  ASA: III  Anesthesia Plan: MAC   Post-op Pain Management:    Induction: Intravenous  Airway Management Planned: Nasal Cannula  Additional Equipment:   Intra-op Plan:   Post-operative Plan:   Informed Consent: I have reviewed the patients History and Physical, chart, labs and discussed the procedure including the risks, benefits and alternatives for the proposed anesthesia with the patient or authorized representative who has indicated his/her understanding and acceptance.     Plan Discussed with:   Anesthesia Plan Comments: (IDCVU 131 this am, will re-check,has been high this admission, working to control)        Anesthesia Quick Evaluation

## 2014-03-25 NOTE — Procedures (Signed)
Electrical Cardioversion Procedure Note Jordan [name] [id number] [birth year]  Procedure: Electrical Cardioversion Indications:  Atrial Fibrillation  Procedure Details Consent: Risks of procedure as well as the alternatives and risks of each were explained to the (patient/caregiver).  Consent for procedure obtained. Time Out: Verified patient identification, verified procedure, site/side was marked, verified correct patient position, special equipment/implants available, medications/allergies/relevent history reviewed, required imaging and test results available.  Performed  Patient placed on cardiac monitor, pulse oximetry, supplemental oxygen as necessary.  Sedation given: Patient sedated by anesthesia with lidocaine 30 mg and diprovan 60 mg IV. Pacer pads placed anterior and posterior chest.  Cardioverted 3 time(s).  Cardioverted at 120J, 150J and 200J; sinus reestablished with first and third cardioversions but patient reverted to atrial fibrillation both times. Will need further EP input.  Evaluation Complications: None Patient did tolerate procedure well.   Kirk Ruths 03/25/2014, 1:15 PM

## 2014-03-25 NOTE — Transfer of Care (Signed)
Immediate Anesthesia Transfer of Care Note  Patient: Jordan [name]  Procedure(s) Performed: Procedure(s): CARDIOVERSION (N/A)  Patient Location: Endoscopy Unit  Anesthesia Type:MAC  Level of Consciousness: awake and patient cooperative  Airway & Oxygen Therapy: Patient Spontanous Breathing  Post-op Assessment: Report given to RN, Post -op Vital signs reviewed and stable and Patient moving all extremities  Post vital signs: Reviewed and stable  Last Vitals:  Filed Vitals:   03/25/14 1305  BP: 129/78  Pulse: 121  Temp: 36.8 C  Resp: 14    Complications: No apparent anesthesia complications

## 2014-03-25 NOTE — Progress Notes (Signed)
PT Cancellation Note  Patient Details Name: Jordan [name] MRN: [id number] DOB: [birth year]   Cancelled Treatment:    Reason Eval/Treat Not Completed: Medical issues which prohibited therapy (HR 150's-160's at rest)   [facility] 03/25/2014, 9:39 AM  [facility] PT [phone]

## 2014-03-25 NOTE — Anesthesia Postprocedure Evaluation (Signed)
  Anesthesia Post-op Note  Patient: Jordan [name]  Procedure(s) Performed: Procedure(s): CARDIOVERSION (N/A)  Patient Location: PACU and Endoscopy Unit  Anesthesia Type:MAC  Level of Consciousness: awake  Airway and Oxygen Therapy: Patient Spontanous Breathing and Patient connected to nasal cannula oxygen  Post-op Pain: none  Post-op Assessment: Post-op Vital signs reviewed  Post-op Vital Signs: Reviewed and stable  Last Vitals:  Filed Vitals:   03/25/14 1305  BP: 129/78  Pulse: 121  Temp: 36.8 C  Resp: 14    Complications: No apparent anesthesia complications

## 2014-03-25 NOTE — Anesthesia Postprocedure Evaluation (Signed)
  Anesthesia Post-op Note  Patient: Jordan [name]  Procedure(s) Performed: Procedure(s): CARDIOVERSION (N/A)  Patient Location: Endoscopy Unit  Anesthesia Type:MAC  Level of Consciousness: awake and patient cooperative  Airway and Oxygen Therapy: Patient Spontanous Breathing  Post-op Pain: none  Post-op Assessment: Post-op Vital signs reviewed, Patient's Cardiovascular Status Stable, Respiratory Function Stable, Patent Airway and No signs of Nausea or vomiting  Post-op Vital Signs: Reviewed and stable  Last Vitals:  Filed Vitals:   03/25/14 1305  BP: 129/78  Pulse: 121  Temp: 36.8 C  Resp: 14    Complications: No apparent anesthesia complications

## 2014-03-25 NOTE — Progress Notes (Addendum)
Progress Note  Subjective:    Converted back into atrial fibrillation w/ RVR on 2/15, converted to NSR yesterday AM, but back to AFib with RVR yesterday afternoon. No chest pain, SOB, dizziness, lightheadedness, or palpitations.   Objective:   Temp:  [97.7 F (36.5 C)-98.8 F (37.1 C)] 97.7 F (36.5 C) (02/18 0400) Pulse Rate:  [36-171] 153 (02/18 0700) Resp:  [12-31] 16 (02/18 0400) BP: (94-145)/(60-98) 142/90 mmHg (02/18 0700) SpO2:  [97 %-100 %] 98 % (02/18 0700) Last BM Date: 03/22/14  Filed Weights   03/19/14 2055  Weight: 139 lb 1.8 oz (63.1 kg)    Intake/Output Summary (Last 24 hours) at 03/25/14 0857 Last data filed at 03/25/14 0700  Gross per 24 hour  Intake   1159 ml  Output    875 ml  Net    284 ml    Telemetry: Atrial Fibrillation w/ RVR  Physical Exam: General: Thin white male, alert, cooperative, NAD. HEENT: PERRL, EOMI. Moist mucus membranes Neck: Supple, no carotid bruits, no lymphadenopathy.  Lungs: Clear to ascultation bilaterally, normal work of respiration, no wheezes, rales, rhonchi Heart: RRR S1 S2 Abdomen: Thin, soft, non-tender, non-distended, BS +. Palpable abdominal aorta w/ audible pulsation on auscultation.  Extremities: No cyanosis, clubbing, or edema. Neurologic: Alert & oriented x3, cranial nerves II-XII intact, weak in RUE. + word-finding difficulty   Lab Results:  Basic Metabolic Panel:  Recent Labs Lab 03/22/14 0305 03/23/14 0226 03/24/14 0230  NA 138 133* 131*  K 4.2 4.6 4.8  CL 109 104 103  CO2 26 23 22   GLUCOSE 113* 279* 396*  BUN 32* 23 21  CREATININE 0.99 0.87 1.00  CALCIUM 8.9 9.0 9.1  MG 1.9 1.9 2.0    Liver Function Tests:  Recent Labs Lab 03/19/14 2307  AST 31  ALT 30  ALKPHOS 112  BILITOT 1.2  PROT 6.6  ALBUMIN 3.4*    CBC:  Recent Labs Lab 03/19/14 2307 03/20/14 0304 03/20/14 0945  WBC 15.6* 6.3 11.9*  HGB 15.2 12.4* 11.5*  HCT 47.1 37.5* 33.6*  MCV 91.8 90.8 88.4  PLT 201 266 336       Medications:   Scheduled Medications: . dabigatran  150 mg Oral BID  . dofetilide  500 mcg Oral BID  . folic acid  1 mg Oral Daily  . insulin aspart  0-20 Units Subcutaneous TID WC  . insulin aspart  0-5 Units Subcutaneous QHS  . insulin aspart  7 Units Subcutaneous TID WC  . insulin glargine  10 Units Subcutaneous QHS  . insulin glargine  12 Units Subcutaneous Q24H  . propranolol  20 mg Oral BID  . propylthiouracil  100 mg Oral Q6H  . sodium chloride  3 mL Intravenous Q12H    Infusions: . diltiazem (CARDIZEM) infusion 15 mg/hr (03/25/14 0400)    PRN Medications: sodium chloride, acetaminophen, ondansetron (ZOFRAN) IV, sodium chloride   Assessment and Plan:  64 y/o M w/ PMHx of DM type II, atrial fibrillation, CVA and thyroid dysfunction 2/2 to ?amiodarone administration, admitted on 03/19/14 w/thyroid storm & atrial fibrillation w/ RVR.   Atrial Fibrillation w/ RVR: Back in NSR after Tikosyn restarted.  Now in Afib. D/w Dr. Rayann Heman.  Plan for cardioversion today.  All questions answered. Continue Tikosyn at discharge.  Spoke to Dr. Rayann Heman who recommended f/u in the AFib clinic with Roderic Palau. He will speak to Dr. Forde Dandy to see if any further endocrine treatment is needed since the RVR is very  fast. -Continue Tikosyn 500 mg bid , back on Cardizem gtt -Continue PTU 100 mg q6h -Continue Propranolol 20 mg bid; hold for HR <60 -Lopressor 5 mg IV q2h prn for tachycardia was stopped due to lack of response -Continue Pradaxa 150 mg bid  CHADSVasc score of 3.  Thyrotoxicosis: As above. TSH 0.006, fT4 elevated at 4.25, fT3 normal. [place] on 03/20/14 showed mildly enlarged thyroid. On PTU. -Continue PTU -Endocrine follow up on discharge with Dr. Forde Dandy  3) DM type II:     Still high CBG  -Increased to ISS-resistant + HS coverage -Increased mealtime coverage to 7 units tid Added nighttime Lantus   Previous CVA with RUE weakness: Stable.  -Continue Pradaxa -PT eval  recommended HHPT  Further plans based on result of cardioversion.  Deonte Otting S.

## 2014-03-25 NOTE — Consult Note (Signed)
ELECTROPHYSIOLOGY CONSULT NOTE    Patient ID: MYLAN SCHWARZ MRN: [id number], DOB/AGE: [birth year] 64 y.o.  Admit date: 03/19/2014 Date of Consult: 03/25/2014  Primary Physician: Albina Billet, MD Primary Cardiologist: Charolotte Capuchin  Reason for Consultation: atrial fibrillation  HPI:  Jordan [name] is a 64 y.o. male with a past medical history significant for hypertension, hyperthryoidism, diabetes, prior stroke and atrial fibrillation.  He reports first being diagnosed with AF about 8 months ago.  He was placed on Amiodarone and developed thyroid dysfunction along with a 10 pound weight loss and his amiodarone was discontinued.  He was subsequently placed on Sotalol 40mg  twice daily (unclear from notes why dose was so low).  He is unsure why they decreased his dose, but he was initially started on a higher dose.  He is symptomatic while in AF with palpitations and fatigue.  Rates during AF are 120-170's.  He has been placed on Tikosyn this admission with conversion to SR initially but then recurrence of atrial fibrillation.  EP has been asked to evaluate for treatment options.   Echocardiogram this admission demonstrated EF 50-55%, mild to moderate MR, PA pressure 42, left pleural effusion, LA 32.   He lives alone but has family close by.  He denies tobacco, alcohol, recreational drug use.  He has lost weight (unsure how much) in the last 8 months.  He denies chest pain, shortness of breath, LE edema, recent fevers, chills, nausea or vomiting.  He has not had dizziness, pre-syncope or syncope.  ROS is otherwise negative except as outlined above.   Past Medical History  Diagnosis Date  . Atrial fibrillation   . Hyperthyroidism     a. 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 Stroke 03/2012    a. residual right sided weakness  . Cancer     a. 06/2011 b. s/p right hemicolectomy  . Type 1 diabetes     a. on insulin b. prior DKA      Surgical History: History  reviewed. No pertinent past surgical history.   Prescriptions prior to admission  Medication Sig Dispense Refill Last Dose  . benazepril (LOTENSIN) 20 MG tablet Take 20 mg by mouth daily.   Past Week at Unknown time  . dabigatran (PRADAXA) 150 MG CAPS capsule Take 150 mg by mouth 2 (two) times daily.   Past Week at Unknown time  . folic acid (FOLVITE) 1 MG tablet Take 1 mg by mouth daily.   Past Week at Unknown time  . insulin aspart (NOVOLOG) 100 UNIT/ML injection Inject 3-6 Units into the skin See admin instructions. 6units with breakfast, 4U lunch, 3U supper   Past Week at Unknown time  . insulin glargine (LANTUS) 100 UNIT/ML injection Inject 12 Units into the skin daily.    Past Week at Unknown time  . sotalol (BETAPACE) 80 MG tablet Take 40 mg by mouth 2 (two) times daily.   Past Week at 0800    Inpatient Medications:  . dabigatran  150 mg Oral BID  . dofetilide  500 mcg Oral BID  . folic acid  1 mg Oral Daily  . insulin aspart  0-20 Units Subcutaneous TID WC  . insulin aspart  0-5 Units Subcutaneous QHS  . insulin aspart  7 Units Subcutaneous TID WC  . insulin glargine  10 Units Subcutaneous QHS  . insulin glargine  12 Units Subcutaneous Q24H  . magnesium sulfate 1 - 4 g bolus IVPB  2 g Intravenous Once  . propranolol  20 mg Oral BID  . propylthiouracil  100 mg Oral Q6H  . sodium chloride  3 mL Intravenous Q12H    Allergies: No Known Allergies  History   Social History  . Marital Status: Single    Spouse Name: N/A  . Number of Children: N/A  . Years of Education: N/A   Occupational History  . Not on file.   Social History Main Topics  . Smoking status: Never Smoker   . Smokeless tobacco: Not on file  . Alcohol Use: No  . Drug Use: No  . Sexual Activity: Not Currently   Other Topics Concern  . Not on file   Social History Narrative     Family History  Problem Relation Age of Onset  . Heart attack Mother      BP 119/74 mmHg  Pulse 70  Temp(Src) 97.9 F  (36.6 C) (Oral)  Resp 16  Ht 5\' 9"  (1.753 m)  Wt 139 lb 1.8 oz (63.1 kg)  BMI 20.53 kg/m2  SpO2 98%  Physical Exam: Well developed and nourished in no acute distress HENT normal Neck supple with JVP-flat Carotids brisk and full without bruits Clear Irregularly irregular rate and rhythm with rapid  ventricular response, no murmurs or gallops Abd-soft with active BS without hepatomegaly No Clubbing cyanosis edema Skin-warm and dry A & Oriented X1  Grossly normal sensory and motor function   Labs:   Lab Results  Component Value Date   WBC 15.2* 03/25/2014   HGB 14.9 03/25/2014   HCT 43.7 03/25/2014   MCV 87.4 03/25/2014   PLT 406* 03/25/2014    Recent Labs Lab 03/19/14 2307  03/25/14 0910  NA 138  < > 132*  K 4.2  < > 5.1  CL 106  < > 99  CO2 24  < > 15*  BUN 18  < > 30*  CREATININE 0.93  < > 1.31  CALCIUM 9.2  < > 9.9  PROT 6.6  --   --   BILITOT 1.2  --   --   ALKPHOS 112  --   --   ALT 30  --   --   AST 31  --   --   GLUCOSE 235*  < > 388*  < > = values in this interval not displayed. No results found for: CKTOTAL, CKMB, CKMBINDEX, TROPONINI   Radiology/Studies: US Soft Tissue Head/neck 03/20/2014   CLINICAL DATA:  Thyroiditis.  EXAM: THYROID ULTRASOUND  TECHNIQUE: Ultrasound examination of the thyroid gland and adjacent soft tissues was performed.  COMPARISON:  None.  FINDINGS: Right thyroid lobe  Measurements: 5.6 x 2.2 x 1.8 cm. Mildly heterogeneous parenchymal echogenicity. No discrete nodules or masses visualized.  Left thyroid lobe  Measurements: 5.6 x 2.3 x 1.8 cm. Mildly heterogeneous parenchymal echogenicity. No discrete nodules or masses visualized.  Isthmus  Thickness: 4 mm.  No nodules visualized.  Lymphadenopathy  None visualized.  IMPRESSION: Mildly enlarged thyroid. No discrete thyroid nodules or masses visualized.   Electronically Signed   By: Earle Gell M.D.   On: 03/20/2014 16:41   Dg Chest Port 1 View 03/20/2014   CLINICAL DATA:  64 year old male  atrial fibrillation. Entered initial  EXAM: PORTABLE CHEST - 1 VIEW  COMPARISON:  None.  FINDINGS: Portable AP semi upright view at 2350 hrs. Mild cardiomegaly. Other mediastinal contours are within normal limits. Streaky bibasilar opacity. No pneumothorax. Trace pleural fluid. No overt edema. No consolidation.  IMPRESSION: Mild cardiomegaly. Streaky basilar opacity, favor atelectasis. Trace pleural fluid.   Electronically Signed   By: Genevie Ann M.D.   On: 03/20/2014 00:22    EKG:AF with RVR, ventricular rate 162, QTc 479  TELEMETRY: AF with RVR, occasional abberration, rare sinus beats   AFib with RVR  Hyperthyroidism/ prob storm with Graves disease with thryotropin receptor AB >6  (ULN 1.75)  Prior CVA  HTN  Have spoken with endo in [place] @[facility]   Thyrtopin  Receptor AB > 6 diagnostic of Graves  I  have spoken with DR Juluis Pitch who prefers in this situation PTU and will increase dose at this point of PTU and BB and stop cardiazem Will  Hold off on resumption of steroids, but may need this and use insuling  His paucity of symptoms gives [place] some time  Dr Forde Dandy will be considering the issues of ablation v surgery  It is noteworthy that no amio is detected, has not been on since 11/15.    More data are expected by Fax from [facility] Dr Francesco Runner)

## 2014-03-26 ENCOUNTER — Encounter (HOSPITAL_COMMUNITY): Payer: Self-pay | Admitting: Cardiology

## 2014-03-26 DIAGNOSIS — E0590 Thyrotoxicosis, unspecified without thyrotoxic crisis or storm: Secondary | ICD-10-CM

## 2014-03-26 LAB — CBC
HCT: 38.1 % — ABNORMAL LOW (ref 39.0–52.0)
Hemoglobin: 13 g/dL (ref 13.0–17.0)
MCH: 29.5 pg (ref 26.0–34.0)
MCHC: 34.1 g/dL (ref 30.0–36.0)
MCV: 86.4 fL (ref 78.0–100.0)
Platelets: 340 10*3/uL (ref 150–400)
RBC: 4.41 MIL/uL (ref 4.22–5.81)
RDW: 12.9 % (ref 11.5–15.5)
WBC: 9.1 10*3/uL (ref 4.0–10.5)

## 2014-03-26 LAB — GLUCOSE, CAPILLARY
GLUCOSE-CAPILLARY: 228 mg/dL — AB (ref 70–99)
GLUCOSE-CAPILLARY: 314 mg/dL — AB (ref 70–99)
GLUCOSE-CAPILLARY: 137 mg/dL — AB (ref 70–99)
Glucose-Capillary: 236 mg/dL — ABNORMAL HIGH (ref 70–99)

## 2014-03-26 LAB — BASIC METABOLIC PANEL
Anion gap: 10 (ref 5–15)
BUN: 25 mg/dL — ABNORMAL HIGH (ref 6–23)
CHLORIDE: 102 mmol/L (ref 96–112)
CO2: 23 mmol/L (ref 19–32)
Calcium: 9.3 mg/dL (ref 8.4–10.5)
Creatinine, Ser: 1.1 mg/dL (ref 0.50–1.35)
GFR calc Af Amer: 81 mL/min — ABNORMAL LOW (ref >90)
GFR calc non Af Amer: 70 mL/min — ABNORMAL LOW (ref >90)
GLUCOSE: 248 mg/dL — AB (ref 70–99)
Potassium: 3.6 mmol/L (ref 3.5–5.1)
Sodium: 135 mmol/L (ref 135–145)

## 2014-03-26 LAB — T3, FREE: T3 FREE: 4.2 pg/mL (ref 2.0–4.4)

## 2014-03-26 LAB — MAGNESIUM: MAGNESIUM: 2 mg/dL (ref 1.5–2.5)

## 2014-03-26 MED ORDER — HYDROCORTISONE 1 % EX CREA
TOPICAL_CREAM | Freq: Three times a day (TID) | CUTANEOUS | Status: DC
  Administered 2014-03-26 – 2014-03-27 (×6): via TOPICAL
  Administered 2014-03-28: 1 via TOPICAL
  Administered 2014-03-28 – 2014-03-30 (×4): via TOPICAL
  Filled 2014-03-26 (×2): qty 28

## 2014-03-26 NOTE — Progress Notes (Signed)
Physical Therapy Treatment Patient Details Name: Jordan [name] MRN: [id number] DOB: January 09, 1951 Today's Date: 04-01-2014    History of Present Illness Pt adm with A-fib with RVR. PMH - Lt CVA, DM, colon CA    PT Comments    Pt with improved gait using Rt AFO. Limited distance due to high HR 150-160's. Encouraged pt to use his AFO at home.  Follow Up Recommendations  Home health PT;Supervision - Intermittent     Equipment Recommendations  None recommended by PT    Recommendations for Other Services       Precautions / Restrictions Precautions Precautions: Fall    Mobility  Bed Mobility                  Transfers Overall transfer level: Needs assistance Equipment used: Rolling walker (2 wheeled);None Transfers: Sit to/from Stand Sit to Stand: Supervision         General transfer comment: supervision for safety  Ambulation/Gait Ambulation/Gait assistance: Min guard Ambulation Distance (Feet): 75 Feet Assistive device: Rolling walker (2 wheeled) (rt AFO) Gait Pattern/deviations: Decreased dorsiflexion - right   Gait velocity interpretation: Below normal speed for age/gender General Gait Details: Pt used Rt AFO. Able to clear rt foot with AFO. Some circumduction of RLE.   Stairs            Wheelchair Mobility    Modified Rankin (Stroke Patients Only)       Balance Overall balance assessment: Needs assistance Sitting-balance support: No upper extremity supported Sitting balance-Leahy Scale: Good     Standing balance support: No upper extremity supported Standing balance-Leahy Scale: Fair                      Cognition Arousal/Alertness: Awake/alert Behavior During Therapy: WFL for tasks assessed/performed Overall Cognitive Status: No family/caregiver present to determine baseline cognitive functioning Area of Impairment: Safety/judgement         Safety/Judgement: Decreased awareness of deficits;Decreased awareness of  safety     General Comments: Pt with rt foot dragging due to old CVA and pt not aware of high fall risk with this.  Expect he has some mild cognitive deficits from CVA    Exercises      General Comments        Pertinent Vitals/Pain Pain Assessment: No/denies pain    Home Living                      Prior Function            PT Goals (current goals can now be found in the care plan section) Progress towards PT goals: Progressing toward goals    Frequency  Min 3X/week    PT Plan Current plan remains appropriate    Co-evaluation             End of Session Equipment Utilized During Treatment: Gait belt Activity Tolerance: Other (comment) (limited by high HR) Patient left: in chair;with call bell/phone within reach     Time: 1154-1209 PT Time Calculation (min) (ACUTE ONLY): 15 min  Charges:  $Gait Training: 8-22 mins                    G Codes:      Aaren Krog 2014/04/01, 12:50 PM  [employer] PT [phone]

## 2014-03-26 NOTE — Progress Notes (Signed)
Progress Note  Subjective:    Converted back into atrial fibrillation w/ RVR on 2/15, converted to NSR 2/16 AM, but back to AFib with RVR 2/16 afternoon. No chest pain, SOB, dizziness, lightheadedness, or palpitations.  Unsuccessful DCCV on 2/18. Increasing propranolol.  Objective:   Temp:  [97.3 F (36.3 C)-98.2 F (36.8 C)] 97.7 F (36.5 C) (02/19 0726) Pulse Rate:  [38-156] 81 (02/19 0800) Resp:  [13-24] 19 (02/19 0800) BP: (89-156)/(54-102) 117/61 mmHg (02/19 0800) SpO2:  [96 %-100 %] 99 % (02/19 0800) Last BM Date: 03/25/14  Filed Weights   03/19/14 2055  Weight: 139 lb 1.8 oz (63.1 kg)    Intake/Output Summary (Last 24 hours) at 03/26/14 6568 Last data filed at 03/26/14 0600  Gross per 24 hour  Intake    495 ml  Output    975 ml  Net   -480 ml    Telemetry: Atrial Fibrillation w/ RVR  Physical Exam: General: Thin white male, alert, cooperative, NAD. HEENT: PERRL, EOMI. Moist mucus membranes Neck: Supple, no carotid bruits, no lymphadenopathy.  Lungs: Clear to ascultation bilaterally, normal work of respiration, no wheezes, rales, rhonchi Heart: RRR S1 S2 Abdomen: Thin, soft, non-tender, non-distended, BS +. Palpable abdominal aorta w/ audible pulsation on auscultation.  Extremities: No cyanosis, clubbing, or edema. Neurologic: Alert & oriented x3, cranial nerves II-XII intact, weak in RUE. + word-finding difficulty   Lab Results:  Basic Metabolic Panel:  Recent Labs Lab 03/23/14 0226 03/24/14 0230 03/25/14 0910  NA 133* 131* 132*  K 4.6 4.8 5.1  CL 104 103 99  CO2 23 22 15*  GLUCOSE 279* 396* 388*  BUN 23 21 30*  CREATININE 0.87 1.00 1.31  CALCIUM 9.0 9.1 9.9  MG 1.9 2.0 1.9    Liver Function Tests:  Recent Labs Lab 03/19/14 2307  AST 31  ALT 30  ALKPHOS 112  BILITOT 1.2  PROT 6.6  ALBUMIN 3.4*    CBC:  Recent Labs Lab 03/20/14 0304 03/20/14 0945 03/25/14 0910  WBC 6.3 11.9* 15.2*  HGB 12.4* 11.5* 14.9  HCT 37.5* 33.6*  43.7  MCV 90.8 88.4 87.4  PLT 266 336 406*      Medications:   Scheduled Medications: . dabigatran  150 mg Oral BID  . dofetilide  500 mcg Oral BID  . folic acid  1 mg Oral Daily  . hydrocortisone cream   Topical TID  . insulin aspart  0-20 Units Subcutaneous TID WC  . insulin aspart  0-5 Units Subcutaneous QHS  . insulin aspart  7 Units Subcutaneous TID WC  . insulin glargine  10 Units Subcutaneous QHS  . insulin glargine  12 Units Subcutaneous Q24H  . propranolol  60 mg Oral Q8H  . propylthiouracil  150 mg Oral Q6H  . sodium chloride  3 mL Intravenous Q12H    Infusions:    PRN Medications: sodium chloride, acetaminophen, ondansetron (ZOFRAN) IV, sodium chloride   Assessment and Plan:  64 y/o M w/ PMHx of DM type II, atrial fibrillation, CVA and thyroid dysfunction 2/2 to ?amiodarone administration, admitted on 03/19/14 w hyperthyroidism & atrial fibrillation w/ RVR.   Atrial Fibrillation w/ RVR:Now in Afib. Unsuccessful cardioversion. Endocrine and EP consulting. -Continue Tikosyn 500 mg bid , back on Cardizem gtt -Continue PTU 100 mg q6h -Continue Propranolol 60 mg TID; hold for HR <60; now off Cardizem -Lopressor 5 mg IV q2h prn for tachycardia was stopped due to lack of response -Continue Pradaxa 150 mg bid  CHADSVasc score of 3.  Thyrotoxicosis: As above. No retrosternal goiter by CT -Continue PTU -Endocrine follow up on discharge with Dr. Forde Dandy  3) DM type II:     Still high CBG  -Increased to ISS-resistant + HS coverage -Increased mealtime coverage to 7 units tid Added nighttime Lantus   Previous CVA with RUE weakness: Stable.  -Continue Pradaxa -PT eval recommended HHPT    Jordan Hauschild S.

## 2014-03-27 DIAGNOSIS — E0501 Thyrotoxicosis with diffuse goiter with thyrotoxic crisis or storm: Secondary | ICD-10-CM

## 2014-03-27 LAB — BASIC METABOLIC PANEL
Anion gap: 4 — ABNORMAL LOW (ref 5–15)
BUN: 26 mg/dL — AB (ref 6–23)
CALCIUM: 9.2 mg/dL (ref 8.4–10.5)
CHLORIDE: 105 mmol/L (ref 96–112)
CO2: 27 mmol/L (ref 19–32)
Creatinine, Ser: 1 mg/dL (ref 0.50–1.35)
GFR calc Af Amer: >90 mL/min (ref >90)
GFR calc non Af Amer: 78 mL/min — ABNORMAL LOW (ref >90)
GLUCOSE: 161 mg/dL — AB (ref 70–99)
Potassium: 4.5 mmol/L (ref 3.5–5.1)
Sodium: 136 mmol/L (ref 135–145)

## 2014-03-27 LAB — GLUCOSE, CAPILLARY
GLUCOSE-CAPILLARY: 166 mg/dL — AB (ref 70–99)
GLUCOSE-CAPILLARY: 297 mg/dL — AB (ref 70–99)
Glucose-Capillary: 150 mg/dL — ABNORMAL HIGH (ref 70–99)
Glucose-Capillary: 315 mg/dL — ABNORMAL HIGH (ref 70–99)

## 2014-03-27 LAB — MAGNESIUM: Magnesium: 1.9 mg/dL (ref 1.5–2.5)

## 2014-03-27 MED ORDER — SODIUM CHLORIDE 0.9 % IV SOLN: 250.0000 mL | INTRAVENOUS | Status: DC

## 2014-03-27 MED ORDER — SODIUM CHLORIDE 0.9 % IJ SOLN: 3.0000 mL | INTRAMUSCULAR | Status: DC | PRN

## 2014-03-27 MED ORDER — SODIUM CHLORIDE 0.9 % IJ SOLN
3.0000 mL | Freq: Two times a day (BID) | INTRAMUSCULAR | Status: DC
  Administered 2014-03-27 – 2014-03-30 (×7): 3 mL via INTRAVENOUS

## 2014-03-27 MED ORDER — ENSURE COMPLETE PO LIQD
237.0000 mL | Freq: Two times a day (BID) | ORAL | Status: DC
  Administered 2014-03-27 – 2014-03-29 (×5): 237 mL via ORAL

## 2014-03-27 MED ORDER — HYDROCORTISONE 1 % EX CREA
1.0000 "application " | TOPICAL_CREAM | Freq: Three times a day (TID) | CUTANEOUS | Status: DC | PRN
  Administered 2014-03-27: 1 via TOPICAL

## 2014-03-27 MED ORDER — SODIUM CHLORIDE 0.9 % IV SOLN: INTRAVENOUS | Status: DC

## 2014-03-27 NOTE — Progress Notes (Signed)
Progress Note  Subjective:     Unsuccessful DCCV on 2/18.  Back in NSR this am. Feels good. No complaints.   Objective:   Temp:  [97.3 F (36.3 C)-98.1 F (36.7 C)] 97.4 F (36.3 C) (02/20 1136) Pulse Rate:  [119] 119 (02/20 0104) Resp:  [18-20] 18 (02/20 1136) BP: (83-118)/(33-86) 104/59 mmHg (02/20 1136) SpO2:  [98 %-100 %] 100 % (02/20 1136) Last BM Date: 03/25/14  Filed Weights   03/19/14 2055  Weight: 63.1 kg (139 lb 1.8 oz)    Intake/Output Summary (Last 24 hours) at 03/27/14 1336 Last data filed at 03/27/14 0400  Gross per 24 hour  Intake    240 ml  Output    300 ml  Net    -60 ml    Telemetry: Atrial Fibrillation w/ RVR  Physical Exam: General: Thin white male, alert, cooperative, NAD. HEENT: PERRL, EOMI. Moist mucus membranes Neck: Supple, no carotid bruits, no lymphadenopathy.  Lungs: Clear to ascultation bilaterally, normal work of respiration, no wheezes, rales, rhonchi Heart: RRR S1 S2 Abdomen: Thin, soft, non-tender, non-distended, BS +. Palpable abdominal aorta w/ audible pulsation on auscultation.  Extremities: No cyanosis, clubbing, or edema. Neurologic: Alert & oriented x3, cranial nerves II-XII intact, weak in RUE. + word-finding difficulty   Lab Results:  Basic Metabolic Panel:  Recent Labs Lab 03/25/14 0910 03/26/14 1000 03/27/14 0342  NA 132* 135 136  K 5.1 3.6 4.5  CL 99 102 105  CO2 15* 23 27  GLUCOSE 388* 248* 161*  BUN 30* 25* 26*  CREATININE 1.31 1.10 1.00  CALCIUM 9.9 9.3 9.2  MG 1.9 2.0 1.9    Liver Function Tests: No results for input(s): AST, ALT, ALKPHOS, BILITOT, PROT, ALBUMIN in the last 168 hours.  CBC:  Recent Labs Lab 03/25/14 0910 03/26/14 1000  WBC 15.2* 9.1  HGB 14.9 13.0  HCT 43.7 38.1*  MCV 87.4 86.4  PLT 406* 340      Medications:   Scheduled Medications: . dabigatran  150 mg Oral BID  . dofetilide  500 mcg Oral BID  . feeding supplement (ENSURE COMPLETE)  237 mL Oral BID BM  .  folic acid  1 mg Oral Daily  . hydrocortisone cream   Topical TID  . insulin aspart  0-20 Units Subcutaneous TID WC  . insulin aspart  0-5 Units Subcutaneous QHS  . insulin aspart  7 Units Subcutaneous TID WC  . insulin glargine  10 Units Subcutaneous QHS  . insulin glargine  12 Units Subcutaneous Q24H  . propranolol  60 mg Oral Q8H  . propylthiouracil  150 mg Oral Q6H  . sodium chloride  3 mL Intravenous Q12H  . sodium chloride  3 mL Intravenous Q12H    Infusions: . sodium chloride    . sodium chloride      PRN Medications: sodium chloride, acetaminophen, hydrocortisone cream, ondansetron (ZOFRAN) IV, sodium chloride, sodium chloride   Assessment and Plan:  64 y/o M w/ PMHx of DM type II, atrial fibrillation, CVA and thyroid dysfunction 2/2 to ?amiodarone administration, admitted on 03/19/14 w hyperthyroidism & atrial fibrillation w/ RVR.   Atrial Fibrillation w/ RVR: - Unsuccessful cardioversion 2/18. Now back in NSR.  - Endocrine and EP consulting. -Continue Tikosyn 500 mg bid , back on Cardizem gtt -Continue PTU 100 mg q6h -Continue Propranolol 60 mg TID; hold for HR <60; now off Cardizem -Lopressor 5 mg IV q2h prn for tachycardia was stopped due to lack of response -Continue  Pradaxa 150 mg bid  CHADSVasc score of 3.  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 As above. No retrosternal goiter by CT -Continue PTU -Endocrine follow up on discharge with Dr. Forde Dandy  DM type II:     Still high CBG - Adjusting lantus   Previous CVA with RUE weakness: Stable.  -Continue Pradaxa -PT eval recommended HHPT  Protein calorie malnutrition, moderate to severe: Nutrition following.   Would consider d/c home if he can maintain NSR > 24 hours.   Young Brim,MD 1:36 PM

## 2014-03-27 NOTE — Progress Notes (Signed)
INITIAL NUTRITION ASSESSMENT Pt meets criteria for MODERATE MALNUTRITION in the context of Chronic illness as evidenced by moderate fat/muscle wasting. DOCUMENTATION CODES Per approved criteria  -Non-severe (moderate) malnutrition in the context of chronic illness   INTERVENTION: -Ensure Complete po BID, each supplement provides 350 kcal and 13 grams of protein  -Gave general recommendations on how he can increase caloric intake when he leaves hospital  NUTRITION DIAGNOSIS: Increased protein-energy needs related to thyroiditis as evidenced by loss of 21 pounds in 9 months.   Goal: Pt to meet >/= 90% of their estimated nutrition needs   Monitor:  Weight, oral intake, test/procedure plans/results, labs  Reason for Assessment: MST of 3  64 y.o. male  Admitting Dx: <principal problem not specified>  ASSESSMENT: 33M with a history of DM1, prior amiodarone induce thyroiditis and persistent AF was seen in Clinic earlier today and was found to have elevated HR in the 140s-160s range.  Spoke to pt. He says he has been eating very well recently and has had no issues of nausea , vomiting, diarrhea, or constipation. Though he eats well, he still loses weight which is believed to be caused by his thyroiditis. He takes mvi at home.   He says his weight 9 months ago before he started losing was 160.He says he can definitely tell he has lost weight and thinks he looks thinner all over.He also reports feeling weak.   Because he is eating well, but still losing weight I gave him some tips on increasing caloric intake by adding higher caloric foods to his diet or starting to drink ensure/boost at home.   Pt is type 1 diabetic. Last HA1C is 6.7 He said he was familiar with diabetic diet and did not need refresher.  Nutrition Focused Physical Exam:  Subcutaneous Fat:  Orbital Region: mild Upper Arm Region: moderate Thoracic and Lumbar Region: moderate  Muscle:  Temple Region:none Clavicle  Bone Region: moderate Clavicle and Acromion Bone Region: mild-moderate Scapular Bone Region: n/a Dorsal Hand: none Patellar Region: none Anterior Thigh Region:n/a Posterior Calf Region: none  Edema:none    Height: Ht Readings from Last 1 Encounters:  03/19/14 5\' 9"  (1.753 m)    Weight: Wt Readings from Last 1 Encounters:  03/19/14 139 lb 1.8 oz (63.1 kg)    Ideal Body Weight: 160 lbs  % Ideal Body Weight: 87%  Wt Readings from Last 10 Encounters:  03/19/14 139 lb 1.8 oz (63.1 kg)    Usual Body Weight: 160  % Usual Body Weight: 87%  BMI:  Body mass index is 20.53 kg/(m^2).  Estimated Nutritional Needs: Kcal: 1600-1900 (25-30 kcal/kg) Protein: 95-107 (1.5-1.7 g/kg) Fluid: 1.6-1.9 liters  Skin: Wound/incision on toe  Diet Order: Diet Carb Modified  EDUCATION NEEDS: -No education needs identified at this time   Intake/Output Summary (Last 24 hours) at 03/27/14 0827 Last data filed at 03/27/14 0400  Gross per 24 hour  Intake    663 ml  Output    550 ml  Net    113 ml    Last BM: 2/18   Labs:   Recent Labs Lab 03/25/14 0910 03/26/14 1000 03/27/14 0342  NA 132* 135 136  K 5.1 3.6 4.5  CL 99 102 105  CO2 15* 23 27  BUN 30* 25* 26*  CREATININE 1.31 1.10 1.00  CALCIUM 9.9 9.3 9.2  MG 1.9 2.0 1.9  GLUCOSE 388* 248* 161*    CBG (last 3)   Recent Labs  03/26/14 1204 03/26/14 1642  03/26/14 2139  GLUCAP 228* 314* 137*    Scheduled Meds: . dabigatran  150 mg Oral BID  . dofetilide  500 mcg Oral BID  . folic acid  1 mg Oral Daily  . hydrocortisone cream   Topical TID  . insulin aspart  0-20 Units Subcutaneous TID WC  . insulin aspart  0-5 Units Subcutaneous QHS  . insulin aspart  7 Units Subcutaneous TID WC  . insulin glargine  10 Units Subcutaneous QHS  . insulin glargine  12 Units Subcutaneous Q24H  . propranolol  60 mg Oral Q8H  . propylthiouracil  150 mg Oral Q6H  . sodium chloride  3 mL Intravenous Q12H    Continuous  Infusions:   Past Medical History  Diagnosis Date  . Atrial fibrillation   . Hyperthyroidism     a. 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 Stroke 03/2012    a. residual right sided weakness  . Cancer     a. 06/2011 b. s/p right hemicolectomy  . Type 1 diabetes     a. on insulin b. prior DKA     Past Surgical History  Procedure Laterality Date  . Cardioversion N/A 03/25/2014    Procedure: CARDIOVERSION;  Surgeon: Lelon Perla, MD;  Location: [facility];  Service: Cardiovascular;  Laterality: N/A;    Burtis Junes RD, LDN Nutrition Pager: [phone] 03/27/2014 11:17 AM

## 2014-03-28 DIAGNOSIS — E440 Moderate protein-calorie malnutrition: Secondary | ICD-10-CM

## 2014-03-28 LAB — GLUCOSE, CAPILLARY
GLUCOSE-CAPILLARY: 387 mg/dL — AB (ref 70–99)
GLUCOSE-CAPILLARY: 384 mg/dL — AB (ref 70–99)
Glucose-Capillary: 286 mg/dL — ABNORMAL HIGH (ref 70–99)
Glucose-Capillary: 111 mg/dL — ABNORMAL HIGH (ref 70–99)

## 2014-03-28 LAB — BASIC METABOLIC PANEL
Anion gap: 7 (ref 5–15)
BUN: 25 mg/dL — ABNORMAL HIGH (ref 6–23)
CO2: 24 mmol/L (ref 19–32)
Calcium: 9 mg/dL (ref 8.4–10.5)
Chloride: 104 mmol/L (ref 96–112)
Creatinine, Ser: 0.88 mg/dL (ref 0.50–1.35)
GFR calc Af Amer: >90 mL/min (ref >90)
GFR calc non Af Amer: 90 mL/min — ABNORMAL LOW (ref >90)
Glucose, Bld: 237 mg/dL — ABNORMAL HIGH (ref 70–99)
POTASSIUM: 4.4 mmol/L (ref 3.5–5.1)
Sodium: 135 mmol/L (ref 135–145)

## 2014-03-28 LAB — MAGNESIUM: MAGNESIUM: 1.9 mg/dL (ref 1.5–2.5)

## 2014-03-28 NOTE — Progress Notes (Signed)
Report called to [facility]; pt will transfer via wheelchair when dinner tray arrives; pt family called and advised of pt's new room #

## 2014-03-28 NOTE — Progress Notes (Signed)
Progress Note  Subjective:    Has been in NSR > 24 hours.  Feels good. No complaints.   Objective:   Temp:  [97.5 F (36.4 C)-98 F (36.7 C)] 97.6 F (36.4 C) (02/21 1220) Pulse Rate:  [74] 74 (02/20 2332) Resp:  [18-20] 20 (02/21 1220) BP: (96-126)/(45-69) 110/69 mmHg (02/21 1220) SpO2:  [97 %-100 %] 97 % (02/21 1220) Last BM Date: 03/27/14  Filed Weights   03/19/14 2055  Weight: 63.1 kg (139 lb 1.8 oz)    Intake/Output Summary (Last 24 hours) at 03/28/14 1345 Last data filed at 03/28/14 1100  Gross per 24 hour  Intake   1200 ml  Output   1450 ml  Net   -250 ml    Telemetry: Atrial Fibrillation w/ RVR  Physical Exam: General: Thin white male, alert, cooperative, NAD. HEENT: PERRL, EOMI. Moist mucus membranes Neck: Supple, no carotid bruits, no lymphadenopathy.  Lungs: Clear to ascultation bilaterally, normal work of respiration, no wheezes, rales, rhonchi Heart: RRR S1 S2 Abdomen: Thin, soft, non-tender, non-distended, BS +. Palpable abdominal aorta w/ audible pulsation on auscultation.  Extremities: No cyanosis, clubbing, or edema. Neurologic: Alert & oriented x3, cranial nerves II-XII intact, weak in RUE. + word-finding difficulty   Lab Results:  Basic Metabolic Panel:  Recent Labs Lab 03/26/14 1000 03/27/14 0342 03/28/14 0315  NA 135 136 135  K 3.6 4.5 4.4  CL 102 105 104  CO2 23 27 24   GLUCOSE 248* 161* 237*  BUN 25* 26* 25*  CREATININE 1.10 1.00 0.88  CALCIUM 9.3 9.2 9.0  MG 2.0 1.9 1.9    Liver Function Tests: No results for input(s): AST, ALT, ALKPHOS, BILITOT, PROT, ALBUMIN in the last 168 hours.  CBC:  Recent Labs Lab 03/25/14 0910 03/26/14 1000  WBC 15.2* 9.1  HGB 14.9 13.0  HCT 43.7 38.1*  MCV 87.4 86.4  PLT 406* 340      Medications:   Scheduled Medications: . dabigatran  150 mg Oral BID  . dofetilide  500 mcg Oral BID  . feeding supplement (ENSURE COMPLETE)  237 mL Oral BID BM  . folic acid  1 mg Oral Daily  .  hydrocortisone cream   Topical TID  . insulin aspart  0-20 Units Subcutaneous TID WC  . insulin aspart  0-5 Units Subcutaneous QHS  . insulin aspart  7 Units Subcutaneous TID WC  . insulin glargine  10 Units Subcutaneous QHS  . insulin glargine  12 Units Subcutaneous Q24H  . propranolol  60 mg Oral Q8H  . propylthiouracil  150 mg Oral Q6H  . sodium chloride  3 mL Intravenous Q12H  . sodium chloride  3 mL Intravenous Q12H    Infusions: . sodium chloride    . sodium chloride      PRN Medications: sodium chloride, acetaminophen, hydrocortisone cream, ondansetron (ZOFRAN) IV, sodium chloride, sodium chloride   Assessment and Plan:  64 y/o M w/ PMHx of DM type II, atrial fibrillation, CVA and thyroid dysfunction 2/2 to ?amiodarone administration, admitted on 03/19/14 w hyperthyroidism & atrial fibrillation w/ RVR.   Atrial Fibrillation w/ RVR: - Unsuccessful cardioversion 2/18. Now back in NSR > 24 hours.  - Endocrine and EP consulting. -Continue Tikosyn 500 mg bid , back on Cardizem gtt -Continue PTU 100 mg q6h -Continue Propranolol 60 mg TID; hold for HR <60; now off Cardizem -Lopressor 5 mg IV q2h prn for tachycardia was stopped due to lack of response -Continue Pradaxa 150 mg  bid  CHADSVasc score of 3.  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 As above. No retrosternal goiter by CT -Continue PTU -Endocrine follow up on discharge with Dr. Forde Dandy  DM type II:     Still high CBG - Adjusting lantus   Previous CVA with RUE weakness: Stable.  -Continue Pradaxa -PT eval recommended HHPT  Protein calorie malnutrition, moderate to severe: Nutrition following.   Transfer to tele. Home in am. Will need to make sure he can get dofetilide prior to d/c.   Tajai Suder,MD 1:45 PM

## 2014-03-28 NOTE — Progress Notes (Signed)
03/28/2014 11:09 PM  The patient received 500 mg of Tykosin at 08:00 PM today. Three hours after the Tykosin, at 11:00 PM, the required EKG was done.  The EKG showed the QTc to be 567.  Earlier the QTc was 0.41 according to the telemetry slip. The patient is in sinus rhythm. Will continue to monitor the patient and will inform the day nurse about the patient's QTc and see if they should allow the doctors to see him before giving his next scheduled Tykosin.  Lupita Dawn

## 2014-03-29 LAB — GLUCOSE, CAPILLARY
GLUCOSE-CAPILLARY: 164 mg/dL — AB (ref 70–99)
GLUCOSE-CAPILLARY: 160 mg/dL — AB (ref 70–99)
Glucose-Capillary: 429 mg/dL — ABNORMAL HIGH (ref 70–99)
Glucose-Capillary: 231 mg/dL — ABNORMAL HIGH (ref 70–99)
Glucose-Capillary: 259 mg/dL — ABNORMAL HIGH (ref 70–99)
Glucose-Capillary: 193 mg/dL — ABNORMAL HIGH (ref 70–99)
Glucose-Capillary: 82 mg/dL (ref 70–99)

## 2014-03-29 LAB — BASIC METABOLIC PANEL WITH GFR
Anion gap: 5 (ref 5–15)
BUN: 25 mg/dL — ABNORMAL HIGH (ref 6–23)
CO2: 27 mmol/L (ref 19–32)
Calcium: 9 mg/dL (ref 8.4–10.5)
Chloride: 102 mmol/L (ref 96–112)
Creatinine, Ser: 0.9 mg/dL (ref 0.50–1.35)
GFR calc Af Amer: >90 mL/min
GFR calc non Af Amer: 89 mL/min — ABNORMAL LOW
Glucose, Bld: 279 mg/dL — ABNORMAL HIGH (ref 70–99)
Potassium: 4 mmol/L (ref 3.5–5.1)
Sodium: 134 mmol/L — ABNORMAL LOW (ref 135–145)

## 2014-03-29 LAB — MAGNESIUM: MAGNESIUM: 2 mg/dL (ref 1.5–2.5)

## 2014-03-29 MED ORDER — DOFETILIDE 250 MCG PO CAPS
250.0000 ug | ORAL_CAPSULE | Freq: Two times a day (BID) | ORAL | Status: DC
  Administered 2014-03-29: 250 ug via ORAL
  Filled 2014-03-29 (×4): qty 1

## 2014-03-29 NOTE — Progress Notes (Signed)
Pt's QTC > 500 this am; notified Amber with EP; orders to hold 500 dose of Tikosyn; dose changed on MAR for 10am and evening dose.  Rowe Pavy, RN

## 2014-03-29 NOTE — Progress Notes (Signed)
Patient Profile: 64 y/o M w/ PMHx of DM type II, atrial fibrillation, CVA and thyroid dysfunction 2/2 to ?amiodarone administration, admitted on 03/19/14 w hyperthyroidism & atrial fibrillation w/ RVR. CHADSVasc score of 3.  Subjective: No complaints. Sitting up eating breakfast.   Objective: Vital signs in last 24 hours: Temp:  [97.4 F (36.3 C)-98.4 F (36.9 C)] 98.4 F (36.9 C) (02/22 0448) Pulse Rate:  [62-70] 62 (02/22 0448) Resp:  [16-22] 16 (02/22 0448) BP: (99-146)/(57-73) 121/61 mmHg (02/22 0448) SpO2:  [97 %-99 %] 98 % (02/22 0448) Last BM Date: 03/29/14  Intake/Output from previous day: 02/21 0701 - 02/22 0700 In: 870 [P.O.:870] Out: 1725 [Urine:1725] Intake/Output this shift:    Medications Current Facility-Administered Medications  Medication Dose Route Frequency Provider Last Rate Last Dose  . 0.9 %  sodium chloride infusion  250 mL Intravenous PRN Jolaine Artist, MD   Stopped at 03/24/14 1400  . 0.9 %  sodium chloride infusion  250 mL Intravenous Continuous Rhonda G Barrett, PA-C   250 mL at 03/27/14 0900  . 0.9 %  sodium chloride infusion   Intravenous Continuous Rhonda G Barrett, PA-C      . acetaminophen (TYLENOL) tablet 650 mg  650 mg Oral Q4H PRN Raliegh Ip, MD      . dabigatran (PRADAXA) capsule 150 mg  150 mg Oral BID Raliegh Ip, MD   150 mg at 03/28/14 2127  . dofetilide (TIKOSYN) capsule 500 mcg  500 mcg Oral BID Jettie Booze, MD   500 mcg at 03/28/14 2000  . feeding supplement (ENSURE COMPLETE) (ENSURE COMPLETE) liquid 237 mL  237 mL Oral BID BM Oswaldo Milian, RD   237 mL at 03/28/14 1657  . folic acid (FOLVITE) tablet 1 mg  1 mg Oral Daily Raliegh Ip, MD   1 mg at 03/28/14 1028  . hydrocortisone cream 1 % 1 application  1 application Topical TID PRN Lonn Georgia, PA-C   1 application at 67/34/19 1124  . hydrocortisone cream 1 %   Topical TID Jettie Booze, MD      . insulin aspart (novoLOG) injection 0-20 Units  0-20 Units  Subcutaneous TID WC Corky Sox, MD   11 Units at 03/29/14 3790  . insulin aspart (novoLOG) injection 0-5 Units  0-5 Units Subcutaneous QHS Corky Sox, MD   5 Units at 03/28/14 2212  . insulin aspart (novoLOG) injection 7 Units  7 Units Subcutaneous TID WC Corky Sox, MD   7 Units at 03/29/14 0730  . insulin glargine (LANTUS) injection 10 Units  10 Units Subcutaneous QHS Corky Sox, MD   10 Units at 03/28/14 2212  . insulin glargine (LANTUS) injection 12 Units  12 Units Subcutaneous Q24H Corky Sox, MD   12 Units at 03/29/14 [phone]  . ondansetron (ZOFRAN) injection 4 mg  4 mg Intravenous Q6H PRN Raliegh Ip, MD      . propranolol (INDERAL) tablet 60 mg  60 mg Oral Q8H Deboraha Sprang, MD   60 mg at 03/29/14 0025  . propylthiouracil (PTU) tablet 150 mg  150 mg Oral Q6H Deboraha Sprang, MD   150 mg at 03/29/14 0423  . sodium chloride 0.9 % injection 3 mL  3 mL Intravenous Q12H Jolaine Artist, MD   3 mL at 03/28/14 1000  . sodium chloride 0.9 % injection 3 mL  3 mL Intravenous PRN Jolaine Artist, MD   3  mL at 03/26/14 2150  . sodium chloride 0.9 % injection 3 mL  3 mL Intravenous Q12H Rhonda G Barrett, PA-C   3 mL at 03/28/14 2132  . sodium chloride 0.9 % injection 3 mL  3 mL Intravenous PRN Lonn Georgia, PA-C        PE: General appearance: alert, cooperative and no distress Neck: no carotid bruit and no JVD Lungs: clear to auscultation bilaterally Heart: regular rate and rhythm, S1, S2 normal, no murmur, click, rub or gallop Extremities: no LEE Pulses: 2+ and symmetric Skin: warm and dry Neurologic: Grossly normal  Lab Results:   Recent Labs  03/26/14 1000  WBC 9.1  HGB 13.0  HCT 38.1*  PLT 340   BMET  Recent Labs  03/27/14 0342 03/28/14 0315 03/29/14 0444  NA 136 135 134*  K 4.5 4.4 4.0  CL 105 104 102  CO2 27 24 27   GLUCOSE 161* 237* 279*  BUN 26* 25* 25*  CREATININE 1.00 0.88 0.90  CALCIUM 9.2 9.0 9.0    Assessment/Plan  Active Problems:    Atrial fibrillation with rapid ventricular response   Malnutrition of moderate degree  Atrial Fibrillation w/ RVR: -Unsuccessful cardioversion 2/18. Now back in NSR > 24 hours.  -On Tikosyn 500 mg bid. He's had 3 days of Tikosyn. EKG last PM showed prolonged QT/QTc of 522/567. He may need a lower dose. MD to assess and will determine.  -Continue PTU 100 mg q6h -Continue Propranolol 60 mg TID -Continue Pradaxa 150 mg bid  CHADSVasc score of 3. 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 As above. No retrosternal goiter by CT -Continue PTU -Endocrine follow up on discharge with Dr. Forde Dandy  DM type II:  - Still w/ elevated CBGs - Continue lantus  Previous CVA with RUE weakness: Stable.  -Continue Pradaxa -PT eval recommended HHPT  Protein calorie malnutrition, moderate to severe:  - Nutrition following.  - Recs are for Ensure Complete PO BID   LOS: 10 days    Brittainy M. Ladoris Gene 03/29/2014 7:43 AM   I have personally seen and examined this patient with Lyda Jester, PA-C. I agree with the assessment and plan as outlined above. He is in sinus and now on Tikosyn. QTc 567. Will cut dose back to 250 mg po BID. Repeat EKG in am. May not be able to tolerate Tikosyn. EP to follow up in am. Continue PTU and plan endocrine follow up after discharge. It appears that the rounding team has been communicating with Dr. Forde Dandy about this pt. Cardiac follow up in [place] with Dr. Chancy Milroy but may need to follow with EP here.   Miosha Behe 03/29/2014 9:17 AM

## 2014-03-29 NOTE — Progress Notes (Signed)
Physical Therapy Treatment Patient Details Name: Jordan [name] MRN: [id number] DOB: 1950-06-12 Today's Date: 03/29/2014    History of Present Illness Pt adm with A-fib with RVR. PMH - Lt CVA, DM, colon CA    PT Comments    Patient is progressing well towards physical therapy goals, ambulating up to 250 feet today. Pt declines use of an assistive device however states he has a cane at home which he is agreeable to use. Reports he feels back to his baseline level of functional ability. Reviewed therapeutic exercises for pt to perfom on his own. Patient will continue to benefit from skilled physical therapy services to further improve independence with functional mobility.    Follow Up Recommendations  Home health PT;Supervision - Intermittent     Equipment Recommendations  None recommended by PT    Recommendations for Other Services       Precautions / Restrictions Precautions Precautions: Fall Required Braces or Orthoses:  (AFO- Rt)    Mobility  Bed Mobility Overal bed mobility: Modified Independent                Transfers Overall transfer level: Needs assistance Equipment used: None Transfers: Sit to/from Stand Sit to Stand: Supervision         General transfer comment: supervision for safety. No loss of balance, minor sway noted. No assist required.  Ambulation/Gait Ambulation/Gait assistance: Min guard Ambulation Distance (Feet): 250 Feet Assistive device: None (rt AFO) Gait Pattern/deviations: Step-through pattern;Decreased step length - left;Decreased stance time - right;Decreased stride length;Decreased dorsiflexion - right;Drifts right/left Gait velocity: decreased   General Gait Details: Pt used Rt AFO. Able to clear rt foot with AFO. Some circumduction of RLE. Declined use of rolling walker. Mild sway noted during gait although did not require physical assist at any point to assist patient with balance. States he is ambulating at his  baseline.   Stairs            Wheelchair Mobility    Modified Rankin (Stroke Patients Only)       Balance                                    Cognition Arousal/Alertness: Awake/alert Behavior During Therapy: WFL for tasks assessed/performed Overall Cognitive Status: No family/caregiver present to determine baseline cognitive functioning                      Exercises General Exercises - Lower Extremity Ankle Circles/Pumps: AROM;Both;10 reps;Seated Gluteal Sets: Strengthening;Both;5 reps;Seated Long Arc Quad: Strengthening;Both;10 reps;Seated Hip Flexion/Marching: Strengthening;Both;5 reps;Seated    General Comments General comments (skin integrity, edema, etc.): HR 70s-90s, SpO2 98% on room air.      Pertinent Vitals/Pain Pain Assessment: No/denies pain    Home Living                      Prior Function            PT Goals (current goals can now be found in the care plan section) Acute Rehab PT Goals PT Goal Formulation: With patient Time For Goal Achievement: 03/29/14 Potential to Achieve Goals: Good Progress towards PT goals: Progressing toward goals    Frequency  Min 3X/week    PT Plan Current plan remains appropriate    Co-evaluation             End of Session Equipment Utilized During Treatment:  Gait belt Activity Tolerance: Patient tolerated treatment well Patient left: with call bell/phone within reach;in bed     Time: 1354-1410 PT Time Calculation (min) (ACUTE ONLY): 16 min  Charges:  $Gait Training: 8-22 mins                    G Codes:      Ellouise Newer 04-27-14, 2:34 PM Camille Bal [place], [place]

## 2014-03-29 NOTE — Progress Notes (Signed)
Inpatient Diabetes Program Recommendations  AACE/ADA: New Consensus Statement on Inpatient Glycemic Control (2013)  Target Ranges:  Prepandial:   less than 140 mg/dL      Peak postprandial:   less than 180 mg/dL (1-2 hours)      Critically ill patients:  140 - 180 mg/dL   Glucose is somewhat out of control randomly. According to his weight of 63 kg, pt should need approximately 25 units basal insulin per day. Inpatient Diabetes Program Recommendations Insulin - Basal: consider increasing Lantus to 15 units am and 10 units pm  Will talk with patient regarding accuracy of med rec stating he takes only 12 units per day. Thank you Rosita Kea, RN, MSN, CDE  Diabetes Inpatient Program Office: [phone] Pager: [phone]

## 2014-03-30 DIAGNOSIS — Z8673 Personal history of transient ischemic attack (TIA), and cerebral infarction without residual deficits: Secondary | ICD-10-CM

## 2014-03-30 DIAGNOSIS — E0590 Thyrotoxicosis, unspecified without thyrotoxic crisis or storm: Secondary | ICD-10-CM

## 2014-03-30 DIAGNOSIS — E109 Type 1 diabetes mellitus without complications: Secondary | ICD-10-CM

## 2014-03-30 DIAGNOSIS — R739 Hyperglycemia, unspecified: Secondary | ICD-10-CM

## 2014-03-30 LAB — BASIC METABOLIC PANEL
ANION GAP: 4 — AB (ref 5–15)
BUN: 24 mg/dL — ABNORMAL HIGH (ref 6–23)
CALCIUM: 9.2 mg/dL (ref 8.4–10.5)
CO2: 29 mmol/L (ref 19–32)
Chloride: 105 mmol/L (ref 96–112)
Creatinine, Ser: 0.97 mg/dL (ref 0.50–1.35)
GFR calc Af Amer: >90 mL/min (ref >90)
GFR, EST NON AFRICAN AMERICAN: 86 mL/min — AB (ref >90)
Glucose, Bld: 45 mg/dL — ABNORMAL LOW (ref 70–99)
POTASSIUM: 3.9 mmol/L (ref 3.5–5.1)
SODIUM: 138 mmol/L (ref 135–145)

## 2014-03-30 LAB — GLUCOSE, CAPILLARY
GLUCOSE-CAPILLARY: 51 mg/dL — AB (ref 70–99)
GLUCOSE-CAPILLARY: 398 mg/dL — AB (ref 70–99)
Glucose-Capillary: 204 mg/dL — ABNORMAL HIGH (ref 70–99)
Glucose-Capillary: 170 mg/dL — ABNORMAL HIGH (ref 70–99)

## 2014-03-30 LAB — MAGNESIUM: MAGNESIUM: 2.1 mg/dL (ref 1.5–2.5)

## 2014-03-30 MED ORDER — INSULIN GLARGINE 100 UNIT/ML ~~LOC~~ SOLN: SUBCUTANEOUS | Status: DC

## 2014-03-30 MED ORDER — PROPRANOLOL HCL 40 MG PO TABS
40.0000 mg | ORAL_TABLET | Freq: Once | ORAL | Status: AC
  Administered 2014-03-30: 40 mg via ORAL
  Filled 2014-03-30: qty 1

## 2014-03-30 MED ORDER — PROPRANOLOL HCL 80 MG PO TABS: 80.0000 mg | ORAL_TABLET | Freq: Three times a day (TID) | ORAL | Status: DC

## 2014-03-30 MED ORDER — PROPYLTHIOURACIL 50 MG PO TABS: 150.0000 mg | ORAL_TABLET | Freq: Four times a day (QID) | ORAL | Status: DC

## 2014-03-30 NOTE — Progress Notes (Signed)
Patient discharged to home with his sister. Discharge instructions given, both the patient and his sister verified understanding of instructions given. Patient taken out to private vehicle via wheelchair. [place], RN

## 2014-03-30 NOTE — Progress Notes (Signed)
SUBJECTIVE: The patient is doing well today.  At this time, he denies chest pain, shortness of breath, or any new concerns. Was maintaining SR, but converted back to AF this morning.  He denies symptoms with afib.  Tikosyn held yesterday morning due to prolonged QT.  QTc 524 this am.  CURRENT MEDICATIONS: . dabigatran  150 mg Oral BID  . feeding supplement (ENSURE COMPLETE)  237 mL Oral BID BM  . folic acid  1 mg Oral Daily  . hydrocortisone cream   Topical TID  . insulin aspart  0-20 Units Subcutaneous TID WC  . insulin aspart  0-5 Units Subcutaneous QHS  . insulin aspart  7 Units Subcutaneous TID WC  . insulin glargine  10 Units Subcutaneous QHS  . insulin glargine  12 Units Subcutaneous Q24H  . propranolol  40 mg Oral Once  . propranolol  60 mg Oral Q8H  . propylthiouracil  150 mg Oral Q6H  . sodium chloride  3 mL Intravenous Q12H  . sodium chloride  3 mL Intravenous Q12H   . sodium chloride    . sodium chloride      OBJECTIVE: Physical Exam: Filed Vitals:   03/29/14 2003 03/30/14 0051 03/30/14 0422 03/30/14 0903  BP: 121/73 100/58 115/62 117/50  Pulse: 57 59 52 70  Temp: 97.7 F (36.5 C)  98.6 F (37 C)   TempSrc: Oral  Oral   Resp: 17  18   Height:      Weight:      SpO2: 100%  99%     Intake/Output Summary (Last 24 hours) at 03/30/14 1221 Last data filed at 03/30/14 0730  Gross per 24 hour  Intake    480 ml  Output   1800 ml  Net  -1320 ml    Telemetry reveals sinus rhythm with conversion to AF this morning  GEN- The patient is well appearing, alert and oriented x 3 today.   Head- normocephalic, atraumatic Eyes-  Sclera clear, conjunctiva pink Ears- hearing intact Oropharynx- clear Neck- supple,  Lungs- Clear to ausculation bilaterally, normal work of breathing Heart- tachycardic irregular rhythm GI- soft, NT, ND, + BS Extremities- no clubbing, cyanosis, or edema Skin- no rash or lesion Psych- euthymic mood, full affect Neuro- strength and  sensation are intact  LABS: Basic Metabolic Panel:  Recent Labs  03/29/14 0444 03/30/14 0442  NA 134* 138  K 4.0 3.9  CL 102 105  CO2 27 29  GLUCOSE 279* 45*  BUN 25* 24*  CREATININE 0.90 0.97  CALCIUM 9.0 9.2  MG 2.0 2.1   RADIOLOGY: Ct Chest W Contrast 03/25/2014   CLINICAL DATA:  Evaluate substernal goiter.  EXAM: CT CHEST WITH CONTRAST  TECHNIQUE: Multidetector CT imaging of the chest was performed during intravenous contrast administration.  CONTRAST:  61mL OMNIPAQUE IOHEXOL 300 MG/ML  SOLN  COMPARISON:  Ultrasound 03/20/2014  FINDINGS: Chest wall: No chest wall mass, supraclavicular or axillary lymphadenopathy. The thyroid gland appears normal. No nodules are identified. The bony thorax is intact. No destructive bone lesions or spinal canal compromise.  Mediastinum: The heart is normal in size. No pericardial effusion. No mediastinal or hilar mass or adenopathy. The aorta branch vessels are patent. No dissection. Three-vessel coronary artery calcifications are noted. The esophagus is grossly normal.  Lungs/pleura: Mild emphysematous changes are noted. No acute pulmonary findings or worrisome pulmonary lesions. No bronchiectasis or interstitial lung disease. The tracheobronchial tree is unremarkable. Minimal areas of subsegmental atelectasis at the lung bases.  Upper abdomen:  No significant findings.  IMPRESSION: 1. Normal CT appearance of the thyroid gland. 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 2. Emphysematous changes but no acute pulmonary findings. 3. Three vessel coronary artery calcifications. 4. No mediastinal or hilar mass or adenopathy.   Electronically Signed   By: Marijo Sanes M.D.   On: 03/25/2014 13:34   Dg Chest Port 1 View 03/20/2014   CLINICAL DATA:  64 year old male atrial fibrillation. Entered initial  EXAM: PORTABLE CHEST - 1 VIEW  COMPARISON:  None.  FINDINGS: Portable AP semi upright view at 2350 hrs. Mild cardiomegaly. Other mediastinal contours are within  normal limits. Streaky bibasilar opacity. No pneumothorax. Trace pleural fluid. No overt edema. No consolidation.  IMPRESSION: Mild cardiomegaly. Streaky basilar opacity, favor atelectasis. Trace pleural fluid.   Electronically Signed   By: Genevie Ann M.D.   On: 03/20/2014 00:22    ASSESSMENT AND PLAN:  Active Problems:   Atrial fibrillation with rapid ventricular response   Malnutrition of moderate degree   1. afib with RVR Very challenging situation in the setting of hyperthyroidism Primary treatment is treatment of hyperthryoidism He has failed medical therapy with Phyllis Ginger and is not a candidate for amiodarone given amio induced thyrotoxicosis I will therefore stop tikosyn and allow it to wash out.  Continue propranolol.  Increase propranolol to 80mg  Q8 hours at this time Continue pradaxa  As we really have no additional options for management at this time, the patient would favor discharge to home with close outpatient follow-up.  He lives in [place] and does not feel that he can travel to [place] for the AF clinic. I will therefore arrange follow-up with Dr Caryl Comes in [place] next week.  He is aware he needs follow up scheduled with his endocrinologist in [place] in next 2-3 days.   2. Hyperthyroidism Continue current PTU dosing at discharge Follow-up asap with primary endocrinologist  Thompson Grayer MD 03/30/2014 12:24 PM

## 2014-03-30 NOTE — Discharge Summary (Signed)
Discharge Summary   Patient ID: Jordan [name],  MRN: [id number], DOB/AGE: Nov 10, 1950 64 y.o.  Admit date: 03/19/2014 Discharge date: 03/30/2014  Primary Care Provider: Albina Billet Primary Cardiologist: Laurelyn Sickle, MD/ EP: Olin Pia, MD   Discharge Diagnoses Principal Problem:   Atrial fibrillation with rapid ventricular response  **In setting of thyrotoxicosis.  **Previous smiodarone induced thyroiditis.  **Failed Sotalol.  **Failed Tikosyn this admission (QTc prolongation to 567 msec).  **Failed DCCV this admission.   Active Problems:   Hyperthyroidism  **PTU initiated this admission.   Thyrotoxicosis   Type I diabetes mellitus   Hyperglycemia   Malnutrition of moderate degree   History of stroke  **Chronic Pradaxa  Allergies No Known Allergies  Procedures  Thyroid Ultrasound 2.13.2016  IMPRESSION: Mildly enlarged thyroid. No discrete thyroid nodules or masses visualized. _____________   2D Echocardiogram 2.15.2016  Study Conclusions  - Left ventricle: The cavity size was normal. Wall thickness was   normal. Systolic function was normal. The estimated ejection   fraction was in the range of 50% to 55%. - Mitral valve: There was mild to moderate regurgitation. - Pulmonary arteries: PA peak pressure: 42 mm Hg (S). - Pericardium, extracardiac: A trivial pericardial effusion was   identified posterior to the heart. There was a left pleural   effusion. _____________   CT Angio of the Chest with Contrast  2.18.2016  IMPRESSION: 1. Normal CT appearance of the thyroid gland. 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 2. Emphysematous changes but no acute pulmonary findings. 3. Three vessel coronary artery calcifications. 4. No mediastinal or hilar mass or adenopathy. _____________   History of Present Illness  63 y/o male with a history of diabetes mellitus, CVA, HTN, and afib on chronic pradaxa.  He is followed by cardiology in [place].  He was  previously placed on amiodarone for management of afib but developed thyroid dysfunction/hyperthyroidism and thus amiodarone was discontinued and he was subsequently placed on sotalol.  This was apparently ineffective in rhythm controlling his afib and he has had persistent tachycardia for several months.  He was seen by his primary care provider on 2/12 and was noted to be tachycardic.  He was sent to [facility] for evaluation and was started on diltiazem gtt for rate control of rapid afib. Decision was made to transfer him to [facility] for further evaluation.  Hospital Course  Following arrival to [facility], pt remained markedly tachycardic despite IV diltiazem.  TSH was found to be suppressed @ 0.006 with a Free T4 of 4.25, thyroperoxidase Ab of 64, thyroglobulin antibody of <1.0, and a Free T3 of 3.2.  This was felt to represent thyrotoxicosis and he was placed on methimazole 10 mg TID and prednisone.  Following steroid administration, he became markedly hyperglycemic resulting in discontinuation of steroids.  Thyroid ultrasound was without masses or nodules.  Echocardiography showed normal LV function.  Endocrinology was consulted via phone early in his admission and after review of records from the patients primary endocrinologist in [place], it was recommended that PTU be titrated to 150 mg Q6h along with further titration of propranolol therapy.    With ongoing tachycardia, EP was consulted and tikosyn was initiated.  Patient converted to sinus rhythm on 2/14 but then reverted back to afib late on 2/15. Pt continued to paroxysmal afib, maintaining sinus rhythm for a portion of a day before reverting back to afib.  He was seen by EP on 2/18 and decision was made to pursue DCCV.  This took  place on 2/18 with three shocks, with the patient resuming afib after each.  Tikosyn and bb therapy were continued and on 2/20, he converted to sinus.  This persisted into 2/21, but his QTc lengthened to a max of 567  msec on 2/22 resulting in its dose being reduced to 250 mcg q12h.  This morning, he has reverted back to rapid atrial fibrillation.  He was again seen by EP with recommendation to discontinue tikosyn and increase propranolol therapy to 80 mg TID.  As he is asymptomatic and his rhythm issues are unlikely to stabilize until his hyperthyroidism is better managed, he is felt to be stable for d/c today. Ultimately, he will require aggressive mgmt and close f/u related to his hyperthyroidism.  Discharge Vitals Blood pressure 117/50, pulse 70, temperature 98.6 F (37 C), temperature source Oral, resp. rate 18, height 5\' 9"  (1.753 m), weight 139 lb 1.8 oz (63.1 kg), SpO2 99 %.  Filed Weights   03/19/14 2055  Weight: 139 lb 1.8 oz (63.1 kg)    Labs  CBC Lab Results  Component Value Date   WBC 9.1 03/26/2014   HGB 13.0 03/26/2014   HCT 38.1* 03/26/2014   MCV 86.4 03/26/2014   PLT 340 46/27/0350    Basic Metabolic Panel  Recent Labs  03/29/14 0444 03/30/14 0442  NA 134* 138  K 4.0 3.9  CL 102 105  CO2 27 29  GLUCOSE 279* 45*  BUN 25* 24*  CREATININE 0.90 0.97  CALCIUM 9.0 9.2  MG 2.0 2.1   Liver Function Tests Lab Results  Component Value Date   ALT 30 03/19/2014   AST 31 03/19/2014   ALKPHOS 112 03/19/2014   BILITOT 1.2 03/19/2014    Hemoglobin A1C Lab Results  Component Value Date   HGBA1C 6.7* 03/19/2014    Fasting Lipid Panel Lab Results  Component Value Date   CHOL 112 03/20/2014   HDL 53 03/20/2014   LDLCALC 50 03/20/2014   TRIG 46 03/20/2014   CHOLHDL 2.1 03/20/2014    Thyroid Function Tests Lab Results  Component Value Date   TSH 0.044* 03/25/2014   Free T4 of 4.25 Thyroperoxidase Ab of 64 Thyroglobulin antibody of <1.0 Free T3 of 3.2.  Disposition  Pt is being discharged home today in good condition.  Follow-up Plans & Appointments  Follow-up Information    Follow up with Albina Billet, MD.   Specialty:  Internal Medicine   Why:  1-2 wks.    Contact information:   [address]   [place] [address] [phone]       Follow up with Dionisio David, MD.   Specialty:  Cardiology   Why:   as scheduled.   Contact information:   [place] [address] [phone]       Follow up with Virl Axe, MD On 04/06/2014.   Specialty:  Cardiology   Why:  12:00 PM   Contact information:   [place] [address] [phone]       Discharge Medications    Medication List    STOP taking these medications        benazepril 20 MG tablet  Commonly known as:  LOTENSIN     sotalol 80 MG tablet  Commonly known as:  BETAPACE      TAKE these medications        dabigatran 150 MG Caps capsule  Commonly known as:  PRADAXA  Take 150 mg  by mouth 2 (two) times daily.     folic acid 1 MG tablet  Commonly known as:  FOLVITE  Take 1 mg by mouth daily.     insulin aspart 100 UNIT/ML injection  Commonly known as:  novoLOG  Inject 3-6 Units into the skin See admin instructions. 6units with breakfast, 4U lunch, 3U supper     insulin glargine 100 UNIT/ML injection  Commonly known as:  LANTUS  12 units in the AM, 10 units at bedtime.     propranolol 80 MG tablet  Commonly known as:  INDERAL  Take 1 tablet (80 mg total) by mouth 3 (three) times daily.     propylthiouracil 50 MG tablet  Commonly known as:  PTU  Take 3 tablets (150 mg total) by mouth every 6 (six) hours.        Outstanding Labs/Studies  None  Duration of Discharge Encounter   Greater than 30 minutes including physician time.  Signed, Murray Hodgkins NP 03/30/2014, 2:05 PM   Thompson Grayer MD

## 2014-03-30 NOTE — Progress Notes (Signed)
Patient had rhythm change to A. Fib with HR 140s-160s. Amber NP notified and verbalized understanding. Patient is currently asymptomatic and resting in bed. Order for EKG placed. Afleming, RN

## 2014-03-30 NOTE — Discharge Instructions (Signed)
***  PLEASE REMEMBER TO BRING ALL OF YOUR MEDICATIONS TO EACH OF YOUR FOLLOW-UP OFFICE VISITS.  

## 2014-03-30 NOTE — Progress Notes (Signed)
At 0601, pt.'s cbg was 51, he was given 4 oz OJ, peanut butter and crackers. At 0657, cbg came up to 204.

## 2014-04-05 ENCOUNTER — Ambulatory Visit: Payer: BC Managed Care – PPO | Admitting: Nurse Practitioner

## 2014-04-06 ENCOUNTER — Encounter: Payer: BC Managed Care – PPO | Admitting: Internal Medicine

## 2014-05-28 NOTE — Consult Note (Signed)
Referring Physician:  Henreitta Leber   Primary Care Physician:  Albina Billet : [address], [place], [place] [address], [phone]  Reason for Consult: Admit Date: 19-Mar-2012  Chief Complaint: stroke  Reason for Consult: CVA   History of Present Illness: History of Present Illness:   HISTORY OF PRESENT ILLNESS: man presented with confusion and dysarthria concerning for acute stroke and was found to be in afib.  Patient has a severe expressive aphasia and as such is unable to communicate his history so I have gathered it from reading prior notes and records and speaking with nursing and care providers. his sister called him to check up on him because he had driven to the hospital to follow up regarding his colon cancer.  She noticed that his voice was slurred and then the phone got cut off.  She then called the hospital security who went around looking for him and found him in the parking lot.  He was unresponsive and sitting in his car but was awake and alert.  He was taken to the ED where his afib was generating heart rates of 160 bpm.  He was given Cardizem which converted him to NSR.  His HCT showed new infarcts in the left basal ganglia and he was started on ASA 325 mg daily.  Initially the patient was seen to have mild to moderate right arm and leg weakness.  MRI Brain was seen to show slight hemorrhagic conversion.  Overnight, the patient developed a severe expressive aphasia with flaccid paralysis in the right upper and lower extremities.  The symptoms are very severe.  They are constant.  No clear inciting event or cause besides the afib.  Nothing makes the symptoms better or worse.     OF SYSTEMS: Unable to obtain due to patient's confusion and aphasia.  MEDICAL HISTORY: Hypertension, paroxysmal atrial fibrillation, diabetes, colon cancer.  HISTORY: No smoking. No alcohol.  No drug use.  Had been living alone.  HISTORY:  Unable to obtain due to patient's confusion and aphasia.    This is not accurate, he cannot tell me. This is based off the computer. Benazepril 20 mg daily, folic acid 1 mg daily, Humalog 5 units with each meal per sliding scale, Humalog NPH 17 units in the morning and 10 units in the evening, metoprolol tartrate 12.5 mg b.i.d., multivitamin daily.   ALLERGIES: No known drug allergies.   GENERAL: NAD.  Normocephalic and atraumatic. exam shows normal disc size, appearance and C/D ratio without clear evidence of papilledema. and S2 sounds are within normal limits, without murmurs, gallops, or rubs. - Normal- NormalDrift - Unable to lift right arm, no drift in LUE- On fall precautions given stroke, so ambulation testing deferred.  0/5    Shoulder abduction (deltoid/supraspinatus, axillary/suprascapular n, C5) 0/5    Elbow flexion (biceps brachii, musculoskeletal n, C5-6) 0/5    Elbow extension (triceps, radial n, C7) 0/5    Finger adduction (interossei, ulnar n, T1)  0/5    Hip flexion (iliopsoas, L1/L2) 0/5    Knee flexion (hamstrings, sciatic n, L5/S1) 0/5    Knee extension (quadriceps, femoral n, L3/4) 0/5    Ankle dorsiflexion (tibialis anterior, deep fibular n, L4/5) 0/5    Ankle plantarflexion (gastroc, tibial n, S1) STATUS:is oriented to person, place and time.  Recent and remote memory cannot be adequately tested due to severe expressive aphasia.  Attention span and concentration cannot be adequately tested due to severe expressive aphasia.  Comprehension is within normal limits.  Expressive speech is severely impaired, able to use single word phrases inconsistently.   NERVES:   CN II (normal visual acuity and visual fields)   CN III, IV, VI (extraocular muscles are intact)   CN V (facial sensation is intact bilaterally)   CN VII (facial strength is intact bilaterally)   CN VIII (hearing is intact bilaterally)   CN IX/X (palate elevates midline, normal phonation)   CN XI (shoulder shrug strength is normal and symmetric)   CN XII (tongue protrudes  midline)  Decreased but present sensation in the RUE and RLE throughout to all modalities.  Otherwise, sensation is within normal limits throughout.   1+/2+    Biceps 1+/2+    Brachioradialis  1+/2+    Patellar 1+/2+    Achilles to nose testing is within normal limits.  64 year old man presented with confusion and dysarthria concerning for acute stroke and was found to be in afib.  have personally viewed his two head CTs and Brain MRI and discussed the results with him.  There is an acute left basal ganglia infarct noted which is what is causing his symptoms.  Given the small but clear amount of hemorrhagic transformation, I would recommend holding anticoagulation until 2 weeks from onset of his symptoms as earlier use may increase his risk for increasing or symptomatic bleed.  There is no indication for heparin or lovenox therapy in the setting of acute stroke for therapeutic purposes as this has been conclusively shown to cause more harm than good on average.  There is some swelling with mass effect notd.  As such, the worsening neurologic symptoms may be due to progression of the infarction to include additional volume in the surrounding penumbra versus swelling/mass effect resulting in transiently increased compression of the corticospinal motor tracts.  In any case, patient shoudl have full stroke workup.  Carotid U/S is negative which I have discussed with patient.  TTE appears to be pending.  I have discussed his labs with him which are unremarkable.  LDL of 63 is excellent.  Continue on ASA 325 mg daily for two weeks at which point he should begin anticoagulation therapy.  No visual field defects noted.  I have advised that the keys to preventing atherosclerosis include controlling blood sugars, blood pressures, cholesterol levels and not smoking.  Given increased swelling noted on imaging, would recommend head of bed is elevated to 45 degrees.        Melrose Nakayama, MD   Past Medical/Surgical  Hx:  Colon or Rectal Cancer:   Diabetes:   Home Medications: Medication Instructions Last Modified Date/Time  Murine Tears preserved ophthalmic solution 1 drop(s) to each affected eye 2 times a day, As Needed 12-Feb-14 13:30  metoprolol tartrate 25 mg oral tablet 0.5 tab(s) orally 2 times a day 12-Feb-14 13:30  benazepril 40 mg oral tablet 0.5 tab(s) orally once a day (in the evening) 12-Feb-14 13:30  Humulin N 100 units/mL subcutaneous suspension 17 unit(s) subcutaneous once a day (in the morning) and 10 units at bedtime 12-Feb-14 13:30  Humalog 100 units/mL subcutaneous solution 5 unit(s) subcutaneous at each meal per sliding scale 12-Feb-14 13:30  multivitamin 1 tab(s) orally once a day (in the morning) 56-CLE-75 17:00  folic acid 1 mg oral tablet 1 tab(s) orally once a day 12-Feb-14 13:30   [facility] Current Meds:  Sodium Chloride 0.9%, 1000 ml at 75 ml/hr  Insulin Drip Per Protocol (nonDKA), 250 unit(s) in Sodium Chloride 0.9% 250  ml  Concentration: (1 unit/ml), Dose Rate: 0.025 units/kg/hr, Titrate  -Indication:Diabetes, Instructions: (First dose 0.025 units/kg/hour)  [Waste Code: Black]  (Bag must be discarded and changed every 24 hrs), Admin Instructions: <<<Bag must be changed every 24 hours>>>  Sodium Chloride 0.9% injection, 2 ml, IV push, q6h  Acetaminophen * tablet, ( Tylenol (325 mg) tablet)  650 mg Oral q4h PRN for pain or temp. greater than 100.4  - Indication: Pain/Fever  Ondansetron injection, ( Zofran injection )  4 mg, IV push, q4h PRN for Nausea/Vomiting  Indication: Nausea/ Vomiting  Folic Acid tablet, ( Folate)  1 mg Oral daily  - Indication: Folic Acid Deficiency  Multivitamin tablet, ( Vitamin - Multiple)  1 tablet(s) Oral daily  - Indication: Prevention and Treatment of Vitamin Deficiencies  Insulin NovoLIN NPH injection, 10 unit(s), Subcutaneous, bid/ac  Indication: Diabetes, [Waste Code: Black]  Amiodarone tablet, ( Pacerone)  200 mg Oral daily  -  Indication: Tachycardia/ Atrial Fibrillation  Aspirin Chewable, 324 mg Oral daily  - Indication: Pain/Fever/Thromboembolic Disorders/Post MI/Prophylaxis MI  Allergies:  Other -Explain in [place]: Other  Vital Signs: **Vital Signs.:   14-Feb-14 00:00  Pulse Pulse 56  Respirations Respirations 23  Systolic BP Systolic BP 569  Diastolic BP (mmHg) Diastolic BP (mmHg) 67  Mean BP 87  Pulse Ox % Pulse Ox % 96  Oxygen Delivery Room Air/ 21 %  Pulse Ox Heart Rate 56    01:00  Pulse Pulse 62  Respirations Respirations 21  Systolic BP Systolic BP 794  Diastolic BP (mmHg) Diastolic BP (mmHg) 64  Mean BP 85  Pulse Ox % Pulse Ox % 97  Pulse Ox Activity Level  At rest  Oxygen Delivery Room Air/ 21 %  Pulse Ox Heart Rate 62    02:00  Vital Signs Type Routine  Pulse Pulse 56  Respirations Respirations 20  Systolic BP Systolic BP 801  Diastolic BP (mmHg) Diastolic BP (mmHg) 61  Mean BP 83  Pulse Ox % Pulse Ox % 96  Oxygen Delivery Room Air/ 21 %  Pulse Ox Heart Rate 56    03:00  Pulse Pulse 54  Respirations Respirations 34  Systolic BP Systolic BP 655  Diastolic BP (mmHg) Diastolic BP (mmHg) 68  Mean BP 85  Pulse Ox % Pulse Ox % 97  Oxygen Delivery Room Air/ 21 %  Pulse Ox Heart Rate 56    04:00  Pulse Pulse 50  Respirations Respirations 22  Systolic BP Systolic BP 374  Diastolic BP (mmHg) Diastolic BP (mmHg) 62  Mean BP 92  Pulse Ox % Pulse Ox % 96  Pulse Ox Activity Level  At rest  Oxygen Delivery Room Air/ 21 %  Pulse Ox Heart Rate 52    05:00  Vital Signs Type Routine  Temperature Temperature (F) 99.8  Celsius 37.6  Temperature Source oral  Pulse Pulse 48  Respirations Respirations 23  Systolic BP Systolic BP 827  Diastolic BP (mmHg) Diastolic BP (mmHg) 65  Mean BP 88  Pulse Ox % Pulse Ox % 97  Oxygen Delivery Room Air/ 21 %  Pulse Ox Heart Rate 50    07:20  Temperature Temperature (F) 99.8  Celsius 37.6  Temperature Source oral  Pulse Pulse 66   Respirations Respirations 47  Systolic BP Systolic BP 078  Diastolic BP (mmHg) Diastolic BP (mmHg) 71  Mean BP 101  Pulse Ox % Pulse Ox % 97  Pulse Ox Activity Level  At rest  Oxygen Delivery  Room Air/ 21 %  Pulse Ox Heart Rate 68    08:30  Pulse Pulse 62  Respirations Respirations 17  Systolic BP Systolic BP 834  Diastolic BP (mmHg) Diastolic BP (mmHg) 71  Mean BP 94    10:31  Pulse Pulse 70  Respirations Respirations 15  Systolic BP Systolic BP 196  Diastolic BP (mmHg) Diastolic BP (mmHg) 73  Mean BP 100    11:00  Pulse Pulse 84  Respirations Respirations 23  Systolic BP Systolic BP 222  Diastolic BP (mmHg) Diastolic BP (mmHg) 52  Mean BP 72    12:00  Temperature Temperature (F) 99.1  Celsius 37.2  Temperature Source oral  Pulse Pulse 142  Respirations Respirations 17  Systolic BP Systolic BP 979  Diastolic BP (mmHg) Diastolic BP (mmHg) 52  Mean BP 73    13:00  Pulse Pulse 144  Respirations Respirations 16  Systolic BP Systolic BP 892  Diastolic BP (mmHg) Diastolic BP (mmHg) 70  Mean BP 88    14:00  Pulse Pulse 64  Respirations Respirations 19  Systolic BP Systolic BP 119  Diastolic BP (mmHg) Diastolic BP (mmHg) 71  Mean BP 85    15:00  Pulse Pulse 130  Respirations Respirations 17  Systolic BP Systolic BP 417  Diastolic BP (mmHg) Diastolic BP (mmHg) 82  Mean BP 101    16:00  Temperature Temperature (F) 99.3  Celsius 37.3  Temperature Source oral  Pulse Pulse 130  Respirations Respirations 15  Systolic BP Systolic BP 408  Diastolic BP (mmHg) Diastolic BP (mmHg) 68  Mean BP 86    17:00  Pulse Pulse 132  Respirations Respirations 14  Systolic BP Systolic BP 144  Diastolic BP (mmHg) Diastolic BP (mmHg) 73  Mean BP 92  Pulse Ox % Pulse Ox % 98  Pulse Ox Activity Level  At rest  Oxygen Delivery Room Air/ 21 %  Pulse Ox Heart Rate 132    18:00  Pulse Pulse 134  Respirations Respirations 18  Systolic BP Systolic BP 818  Diastolic BP (mmHg)  Diastolic BP (mmHg) 52  Mean BP 73  Pulse Ox % Pulse Ox % 96  Pulse Ox Activity Level  At rest  Oxygen Delivery Room Air/ 21 %  Pulse Ox Heart Rate 136    19:00  Vital Signs Type Routine  Pulse Pulse 50  Respirations Respirations 17  Systolic BP Systolic BP 563  Diastolic BP (mmHg) Diastolic BP (mmHg) 54  Mean BP 79  Pulse Ox % Pulse Ox % 98  Pulse Ox Activity Level  At rest  Oxygen Delivery Room Air/ 21 %  Pulse Ox Heart Rate 52    20:00  Vital Signs Type Routine  Temperature Temperature (F) 98.9  Celsius 37.1  Temperature Source oral  Pulse Pulse 52  Respirations Respirations 18  Systolic BP Systolic BP 149  Diastolic BP (mmHg) Diastolic BP (mmHg) 45  Mean BP 74  Pulse Ox % Pulse Ox % 98  Pulse Ox Activity Level  At rest  Oxygen Delivery Room Air/ 21 %  Pulse Ox Heart Rate 52    21:00  Vital Signs Type Routine  Pulse Pulse 52  Respirations Respirations 17  Systolic BP Systolic BP 702  Diastolic BP (mmHg) Diastolic BP (mmHg) 62  Mean BP 84  Pulse Ox % Pulse Ox % 98  Pulse Ox Activity Level  At rest  Oxygen Delivery Room Air/ 21 %  Pulse Ox Heart Rate 52  22:00  Vital Signs Type Routine  Pulse Pulse 50  Respirations Respirations 17  Systolic BP Systolic BP 325  Diastolic BP (mmHg) Diastolic BP (mmHg) 63  Mean BP 81  Pulse Ox % Pulse Ox % 98  Pulse Ox Activity Level  At rest  Oxygen Delivery Room Air/ 21 %  Pulse Ox Heart Rate 52    23:00  Vital Signs Type Routine  Pulse Pulse 50  Respirations Respirations 14  Systolic BP Systolic BP 498  Diastolic BP (mmHg) Diastolic BP (mmHg) 64  Mean BP 81  Pulse Ox % Pulse Ox % 99  Pulse Ox Activity Level  At rest  Oxygen Delivery Room Air/ 21 %  Pulse Ox Heart Rate 52   Lab Results: Thyroid:  12-Feb-14 18:05   Thyroid Stimulating Hormone 1.21 (0.45-4.50 (International Unit)  ----------------------- Pregnant patients have  different reference  ranges for TSH:  - - - - - - - - - -  Pregnant, first  trimetser:  0.36 - 2.50 uIU/mL)  Hepatic:  12-Feb-14 18:05   Bilirubin, Total 0.4  Alkaline Phosphatase 89  SGPT (ALT) 32  SGOT (AST) 29  Total Protein, Serum  8.7  Albumin, Serum 4.3  General Ref:  13-Feb-14 04:34   LDL, Direct ========== TEST NAME ==========  ========= RESULTS =========  = REFERENCE RANGE =  LDL, DIRECT  LDL Cholesterol (Direct) LDL Chol. (Direct)              [   63 mg/dL             ]              0-99 Comment:               [facility]         No: [id number]           [address] [place], [place] [address]           Lindon Romp, [place]   Result(s) reported on 20 Mar 2012 at 06:17PM.  Cardiology:  13-Feb-14 10:37   Echo Doppler  Interpretation Summary   Normal chamber size and function, no significant valvular  abnormalities. No thrombus seen.   PatientHeight: 175 cm   PatientWeight: 73 kg   BSA: 1.9 m2  Procedure:   The study was technically difficult with many images being suboptimal  in quality.   The study was completed  in the intensive care unit.   A two-dimensional transthoracic echocardiogram with color flow and  Doppler was performed.  Left Ventricle   The left ventricle is grossly normal size.   There is no thrombus.   Left ventricular systolic function is normal.   Ejection Fraction = >55%.  Atria   The left atrial size is normal.  MMode 2D Measurements and Calculations   RVDd: 2.7 cm   IVSd: 0.75 cm   LVIDd: 4.8 cm   LVIDs: 3.2 cm   LVPWd: 1.0 cm   FS: 34 %   EF(Teich): 63 %   Ao root diam: 3.3 cm   LA dimension: 3.1 cm   LVOT diam: 2.2 cm  Doppler Measurements and Calculations   MV E point: 88 cm/sec   MV A point: 64 cm/sec   MV E/A: 1.4    MV dec time: 0.30 sec   Ao V2 max: 155 cm/sec   Ao max PG: 10 mmHg   AVA(V,D): 2.8 cm2  LV max PG: 5.0 mmHg   LV V1 max: 114 cm/sec   PA V2 max: 94 cm/sec   PA max PG: 4.0 mmHg   TR Max vel: 173 cm/sec   TR Max PG: 12 mmHg   RVSP:  17 mmHg   RAP systole: 5.0 mmHg  Reading Physician: Neoma Laming  Sonographer: Sherrie Sport Interpreting Physician:  Neoma Laming,  electronically signed on  03-21-2012 08:49:09 Requesting Physician: Neoma Laming  Routine Chem:  12-Feb-14 18:05   Glucose, Serum  123  BUN  27  Creatinine (comp) 1.11  Sodium, Serum 139  Potassium, Serum 4.1  Chloride, Serum 106  CO2, Serum 26  Calcium (Total), Serum 9.5  Anion Gap 7  Osmolality (calc) 284  eGFR (African American) >60  eGFR (Non-African American) >60 (eGFR values <25mL/min/1.73 m2 may be an indication of chronic kidney disease (CKD). Calculated eGFR is useful in patients with stable renal function. The eGFR calculation will not be reliable in acutely ill patients when serum creatinine is changing rapidly. It is not useful in  patients on dialysis. The eGFR calculation may not be applicable to patients at the low and high extremes of body sizes, pregnant women, and vegetarians.)  13-Feb-14 04:34   Glucose, Serum  228  BUN  20  Creatinine (comp) 0.98  Sodium, Serum 138  Potassium, Serum 4.4  Chloride, Serum  108  CO2, Serum 21  Calcium (Total), Serum 8.5  Anion Gap 9  Osmolality (calc) 285  eGFR (African American) >60  eGFR (Non-African American) >60 (eGFR values <23mL/min/1.73 m2 may be an indication of chronic kidney disease (CKD). Calculated eGFR is useful in patients with stable renal function. The eGFR calculation will not be reliable in acutely ill patients when serum creatinine is changing rapidly. It is not useful in  patients on dialysis. The eGFR calculation may not be applicable to patients at the low and high extremes of body sizes, pregnant women, and vegetarians.)  Cardiac:  12-Feb-14 18:05   CK, Total 108  CPK-MB, Serum 1.3 (Result(s) reported on 19 Mar 2012 at 06:55PM.)  Troponin I < 0.02 (0.00-0.05 0.05 ng/mL or less: NEGATIVE  Repeat testing in 3-6 hrs  if clinically indicated. >0.05 ng/mL:  POTENTIAL  MYOCARDIAL INJURY. Repeat  testing in 3-6 hrs if  clinically indicated. NOTE: An increase or decrease  of 30% or more on serial  testing suggests a  clinically important change)  Routine UA:  12-Feb-14 19:18   Color (UA) Yellow  Clarity (UA) Hazy  Glucose (UA) 150 mg/dL  Bilirubin (UA) Negative  Ketones (UA) Trace  Specific Gravity (UA) 1.012  Blood (UA) Negative  pH (UA) 6.0  Protein (UA) Negative  Nitrite (UA) Negative  Leukocyte Esterase (UA) Negative (Result(s) reported on 19 Mar 2012 at 07:58PM.)  RBC (UA) 3 /HPF  WBC (UA) 1 /HPF  Bacteria (UA) NONE SEEN  Epithelial Cells (UA) NONE SEEN  Mucous (UA) PRESENT  Hyaline Cast (UA) 1 /LPF (Result(s) reported on 19 Mar 2012 at 07:58PM.)  Routine Coag:  12-Feb-14 18:05   Prothrombin 12.8  INR 0.9 (INR reference interval applies to patients on anticoagulant therapy. A single INR therapeutic range for coumarins is not optimal for all indications; however, the suggested range for most indications is 2.0 - 3.0. Exceptions to the INR Reference Range may include: Prosthetic heart valves, acute myocardial infarction, prevention of myocardial infarction, and combinations of aspirin and anticoagulant. The need for a higher or lower target INR must be assessed individually. Reference:  The Pharmacology and Management of the Vitamin K  antagonists: the seventh ACCP Conference on Antithrombotic and Thrombolytic Therapy. BWLSL.3734 Sept:126 (3suppl): [id number]. A HCT value >55% may artifactually increase the PT.  In one study,  the increase was an average of 25%. Reference:  "Effect on Routine and Special Coagulation Testing Values of Citrate Anticoagulant Adjustment in Patients with High HCT Values." American Journal of Clinical Pathology 2006;126:400-405.)  Routine Hem:  12-Feb-14 18:05   WBC (CBC)  12.2  RBC (CBC) 4.74  Hemoglobin (CBC) 14.5  Hematocrit (CBC) 42.8  Platelet Count (CBC) 322 (Result(s) reported on 19 Mar 2012 at 06:16PM.)  MCV 90  MCH 30.7  MCHC 34.0  RDW 12.5  13-Feb-14 04:34   WBC (CBC) 10.5  RBC (CBC) 4.43  Hemoglobin (CBC) 13.3  Hematocrit (CBC) 40.4  Platelet Count (CBC) 275  MCV 91  MCH 30.0  MCHC 32.9  RDW 12.4  Neutrophil % 76.7  Lymphocyte % 14.3  Monocyte % 7.6  Eosinophil % 0.9  Basophil % 0.5  Neutrophil #  8.1  Lymphocyte # 1.5  Monocyte # 0.8  Eosinophil # 0.1  Basophil # 0.0 (Result(s) reported on 20 Mar 2012 at 04:54AM.)   Radiology Results: [place]:    13-Feb-14 15:11, US Carotid Doppler Bilateral  US Carotid Doppler Bilateral   REASON FOR EXAM:    Acute CVA  COMMENTS:       PROCEDURE: [place]  - US CAROTID DOPPLER BILATERAL  - Mar 20 2012  3:11PM     RESULT: Carotid Doppler interrogation is performed in the standard   fashion. The patient has no previous study for comparison. There is   scattered atherosclerotic plaque to a minimal degree involving the right   carotid bulb and proximal internal carotid and the left carotid bulb.   Visually there is no stenosis. There is no ulceration or mobile thrombus   present. The color Doppler and spectral Doppler appearance is normal I   laterally in the carotids. Antegrade flow is seen within the vertebral   arteries without flow reversal. The peak systolic velocities are normal   in the carotids bilaterally. The internal to commoncarotid peak systolic   velocity ratio is 1.10 on the right and 1.07 on the left.  IMPRESSION:   1. Atherosclerotic disease without evidence of hemodynamically   significant stenosis. Antegrade flow in both vertebrals without flow   reversal.    Dictation Site: 6        Verified By: Sundra Aland, M.D., MD  MRI:    13-Feb-14 14:39, MRI Brain Without Contrast  MRI Brain Without Contrast   REASON FOR EXAM:    Acute CVA  COMMENTS:       PROCEDURE: MR  - MR BRAIN WO CONTRAST  - Mar 20 2012  2:39PM     RESULT: History: CVA.    Comparison Study: Head CT of 03/19/2012.      Findings: Diffusion-weighted images reveal acute infarct in the region of   the left caudate nucleus and lenticular nucleus. A subtle component of   hemorrhage  appears to be present in this region. Slight mass effect on   the  frontal horn of left lateral ventricles present. Vascular flow voids   are normal.  IMPRESSION:  Acute left basal ganglia infarct with a subtle amount of   hemorrhage in slight mass effect on the anterior horn of the frontal horn   left lateral ventricle. Report phoned to patient's physician at time of  study.        Verified By: Osa Craver, M.D., MD  CT:    12-Feb-14 19:29, CT Head Without Contrast  CT Head Without Contrast   REASON FOR EXAM:    confused  COMMENTS:       PROCEDURE: CT  - CT HEAD WITHOUT CONTRAST  - Mar 19 2012  7:29PM     RESULT: Comparison:  None    Technique: Multiple axial images from the foramen magnum to the vertex   were obtained without IV contrast.    Findings:    There is no evidence for mass effect, midline shift, or extra-axial fluid   collections. No evidence of intracranial hemorrhage. There is focal low   attenuation and mild expansion of the left caudate head concerning for   acute infarct. There is low-attenuation in the medial aspect of the left     basal ganglia. The origin of the left middle cerebral artery is   relatively hyperdense. Thrombus within this vessel cannot in the excluded.    There are several foci of air within theleft masticator space as well as   the left base of skull which are of uncertain etiology. There are a few   foci of air in the left side of the epidural space the level of C1.    The osseous structures are unremarkable.    IMPRESSION:      1. Findings concerning for acute infarcts involving the left caudate head   and medial aspect of the left basal ganglia. Further evaluation with MRI   is suggested.  2. The origin of the left middle cerebral artery is relatively      hyperdense. Thrombus withinthis vessel cannot be excluded.  3. There are several foci of air in the soft tissues of the left   masticator space and the soft tissues at the base of skull. There are a   few foci of air in the epidural space at the level of C1. These are   nonspecific and of uncertain etiology.      Dictation Site: 8        Verified By: Gregor Hams, M.D., MD    14-Feb-14 08:05, CT Head Without Contrast  CT Head Without Contrast   REASON FOR EXAM:    right weakness  COMMENTS:       PROCEDURE: CT  - CT HEAD WITHOUT CONTRAST  - Mar 21 2012  8:05AM     RESULT:      Technique: Helical noncontrast 5 mm sections were obtained from the skull   base through the vertex.    Findings:  The area of low attenuation within the basal ganglia region on   the left has increased in conspicuity and distribution when compared to   the previous study. This study was compared to a previous study dated   03/19/2012. These findings have CT appearance of extension of the   previously described region of infarction. There does not appear to be CT     appreciable hemorrhage within the bed. There is mild mass effect upon the   anterior horn of the left lateral ventricle. There is no evidence of   subfalcine or tonsillar herniation. No further intra-axial or extra-axial   fluid collections are identified. The osseous structures demonstrate no   evidence of fracture or dislocation. The paranasal sinuses, in the   visualized portions, are unremarkable.    IMPRESSION:  CT findings which appear to be consistent with extension  of   the previously described basal ganglia region of infarction on the left.   Clinical correlation is recommended.      Thank you for this opportunity to contribute to the care of your patient.       Verified By: Mikki Santee, M.D., MD   Electronic Signatures: Anabel Bene (MD)  (Signed 14-Feb-14 23:57)  Authored: REFERRING PHYSICIAN, Primary  Care Physician, Consult, History of Present Illness, PAST MEDICAL/SURGICAL HISTORY, HOME MEDICATIONS, Current Medications, ALLERGIES, NURSING VITAL SIGNS, LAB RESULTS, RADIOLOGY RESULTS   Last Updated: 14-Feb-14 23:57 by Anabel Bene (MD)

## 2014-05-28 NOTE — Consult Note (Signed)
PATIENT NAME:  Jordan [name], Jordan [name] MR#:  [id number] DATE OF BIRTH:  08/31/1950  DATE OF CONSULTATION:  06/11/2012  REFERRING PHYSICIAN:  Sona Patel, MD CONSULTING PHYSICIAN:  A. Melissa , MD  PRIMARY CARE PHYSICIAN: Denny Tate, MD   CHIEF COMPLAINT: Diabetic ketoacidosis and uncontrolled diabetes.   HISTORY OF PRESENT ILLNESS: This is a 64-year-old male with a history of type 1 diabetes, who was admitted yesterday in diabetic ketoacidosis. He presented with an initial glucose of 885, creatinine 3.07, potassium 6, bicarbonate 7, anion gap 29, arterial pH 7.1, with 2+ urinary ketones. The patient was admitted to the Intensive Care Unit and placed on IV fluids and IV insulin. Blood sugars improved overnight and once his anion gap closed, he was transitioned to subcutaneous insulin. He was given Lantus 35 units at 10:00 p.m. Today he has been covered as well with  initially NovoLog and then changed to regular insulin sliding scale. In total, he has received 28 units of either NovoLog or regular in the last 24 hours with blood sugars in the range of 270 to 290. He feels much improved. He had significant confusion yesterday, but that has resolved. He denies nausea or vomiting. He has a good appetite.   The patient has had diabetes since 1985. He denies any known complications from diabetes. He was on a regimen of NPH and regular up until February of this year. In February, he was admitted for an acute stroke in the setting of acute atrial fibrillation and during that hospitalization and subsequently, has been on  Lantus and NovoLog insulin. The patient reports good compliance with his insulin. Outpatient regimen is Lantus 35 units at bedtime and NovoLog 10 units t.i.d. a.c. He does not have a sliding scale at home. He does check his blood sugars 4 times daily and recalls blood sugars are typically high in the 200 to 400 range. He attributes this to Lantus and NovoLog, claiming that prior to his stroke in  February when taking NPH and regular, his sugars were much better. His current hemoglobin A1c is 8.1%.   PAST MEDICAL HISTORY: 1.  Type 1 diabetes, diagnosis 1985.  2.  Hypertension.  3.  History of atrial fibrillation.  4.  CVA February 2014.  5.  Colon cancer, stage II, status post right-sided hemicolectomy May 2013.   ALLERGIES: No known drug allergies.   SOCIAL HISTORY: The patient lives alone. No alcohol or tobacco use. Prior history of tobacco use, quit in 1982.   FAMILY HISTORY: Positive for diabetes and heart disease.   CURRENT INPATIENT MEDICATIONS:  1.  Lantus 40 units at bedtime.  2.  Amiodarone 200 mg daily.  3.  Regular insulin sliding scale.  4.  Folic acid 1 mg daily.  5.  Zosyn 3.375 grams q. 8 hours.  6.  Pradaxa 75 mg b.i.d.  7.  Normal saline 0.9 + 20 KCl at 150 mL/hour.  REVIEW OF SYSTEMS:  GENERAL: Denies weight loss. Reports good appetite.  HEENT: Denies blurred vision or sore throat.  NECK: Denies neck pain or dysphagia.  CARDIAC: Denies chest pain or palpitations.  PULMONARY: Denies cough or shortness of breath.  ABDOMEN: Denies abdominal pain. No nausea.  EXTREMITIES: Denies leg swelling.  SKIN: Denies rash or recent skin changes.  ENDOCRINE:  Denies heat or cold intolerance. No polyuria or polydipsia.   PHYSICAL EXAMINATION: VITAL SIGNS: Height 70 inches, weight 165 pounds, BMI 23, temperature 98.2, pulse 76 to 120, respirations 16 to 18, blood pressure 119/77,   pulse ox 96% on room air.  GENERAL: Well-developed, well-nourished white male in no acute distress.  HEENT: Extraocular movements are intact.  Oropharynx is clear.   NECK: Supple. No appreciable thyromegaly. No neck tenderness.  CARDIAC: Regular rate and rhythm.  ABDOMEN: Diffusely soft, nontender, nondistended.  EXTREMITIES: No edema is present.  SKIN: No rash or dermatopathy present.  LYMPH: No submandibular lymphadenopathy.   PSYCHIATRIC: Alert and oriented x 3.   LABORATORY,  DIAGNOSTIC, AND RADIOLOGICAL DATA: Glucose 287, BUN 36, creatinine 1.81, sodium 145, potassium 4.4, chloride 118, EGFR 39, anion gap 6, total cholesterol 85, LDL 27, HDL 53, triglycerides 24. Hemoglobin A1c 8.1%, hematocrit 32%, WBC 21.5.   Head CT, noncontrast: showed prior infarct or evolving ischemic event in the caudate head region as previously seen and unchanged from prior CT in February 2014.   Portable chest x-ray: Bilateral interstitial opacities secondary to asymmetric pulmonary edema.   ASSESSMENT: A 64-year-old male with type 1 diabetes, admitted yesterday with diabetic ketoacidosis. Diabetic ketoacidosis is now resolved after judicious IV fluids and IV insulin. Blood sugars remain uncontrolled and in the 200s. Cause of diabetic ketoacidosis is not clear, although he has been running high sugars for many weeks, so likely there has been inadequate insulin delivery. No evidence of infectious etiology, although he is on empiric antibiotics.   RECOMMENDATIONS: 1.  Agree with increasing Lantus to 40 units tonight.  2.  Add prandial coverage. He is on a low carb diet; however, he is getting between 3 to 5 servings of carbohydrates  per meal. This will require prandial insulin. I plan to initiate NovoLog 8 units three times a day before meals and decrease to carbohydrate count in his meals to help improve blood sugars. Also, we will adjust his NovoLog sliding scale to be less aggressive.   I discussed with the patient long-term care, and likely he would benefit from learning how to carbohydrate count and adjust his insulin based on meal carbohydrate intake. Outpatient follow-up with the lifestyle center would be beneficial. I am also available for follow-up, should he find that helpful.   I will follow along with you.   ____________________________ A. Melissa , MD ams:cc D: 06/11/2012 17:33:20 ET T: 06/11/2012 18:17:40 ET JOB#: [id number]  cc: A. Melissa , MD, <Dictator> Denny C.  Tate, MD A. MELISSA  MD ELECTRONICALLY SIGNED 06/19/2012 8:16 

## 2014-05-28 NOTE — Discharge Summary (Signed)
PATIENT NAME:  Jordan [name] MR#:  [id number] DATE OF BIRTH:  31-Oct-1950  DATE OF ADMISSION:  03/19/2012 DATE OF DISCHARGE:  03/28/2012  DISCHARGE DISPOSITION: [name].   DISCHARGE DIAGNOSES:  1.  Altered mental status with acute confusion secondary to acute cerebrovascular accident in the left basal ganglia. 2.  Aphasia due to acute cerebrovascular accident.  3.  Chronic atrial fibrillation.  4.  Diabetes mellitus. 5.  Hypertension.  6.  History of colon cancer.  7.  Constipation.   CONSULTATIONS: Dr. Neoma Laming and also Dr. Gurney Maxin from Neurology.   DISCHARGE MEDICATIONS:  1.  Folic acid 1 mg p.o. daily.  2.  Multivitamin 1 tablet daily. 3.  Benazepril 40 mg half tablet daily. 4.  Pradaxa 150 mg p.o. b.i.d. 5.  Amiodarone 200 mg p.o. daily.  6.  Insulin aspart 9 units t.i.d. with meals.  7.  Lantus 35 units every 24 hours. 8.  Lactulose 30 mL b.i.d. as needed for constipation.  9.  Aspirin 81 mg daily. 10.  The patient's metoprolol has been stopped.   DIET: Diet is mechanical soft diet with thin liquids, aspiration precautions.   CONSULTATIONS: Neurology consultation as I mentioned, also Physical Therapy.   HOSPITAL COURSE: This is a 64 year old male patient brought in because of slurred speech and found to have rapid a-fib. The patient had an appointment with Dr. Inez Pilgrim for followup of colon cancer, and the patient's sister was trying to get in touch with him. When she spoke to him the patient did not sound right with slurred speech, and at that time the patient was in the hospital parking lot. She called the security at the hospital, asked for them to look for the patient, and CODE 250 was called, and the patient was unresponsive in his car. The patient was brought into the emergency room immediately, and he was found to be in atrial fibrillation with a heart rate of 160 and he had multiple doses of Cardizem received. A CAT scan of the head showed  acute stroke so we admitted him for acute stroke and atrial fibrillation with RVR.   The patient was started on aspirin. MRI of the brain along with carotid ultrasound and echocardiogram was obtained. He got the neuro checks, speech therapy and physical therapy, > to see him, and Dr. Melrose Nakayama did not recommend adjunct heparin  for him  The patient's MRI of the brain showed acute left basal ganglia stroke with subtle amount of hemorrhage and slight mass effect on the anterior horn of the frontal horn. The patient also had echocardiogram which showed EF of more than 55% with normal LV function. Carotid ultrasound showed atherosclerotic disease without evidence of hemodynamically significant stenosis.   The patient did not get anticoagulation initially because of the hemorrhagic changes that are seen in the MRI and monitored closely. The patient had another CT scan that showed extension of previously described basal ganglia infarct on the left. This was done on 03/21/2012.   The patient hemodynamically stayed stable, neurologically stayed stable, followed by physical therapy. The patient's physical therapy recommended acute rehab. The patient did not want to go to acute rehab because of insurance issues. He chose short-term rehab and now he has a place at [facility].  He will be going there today.   He is already started on Pradaxa for his a-fib. As repeat CAT scan did not show any hemorrhage and the patient's CT head is stable and he is  neurologically stable, so I discussed with Dr. Tamala Julian who was on call. He recommended to go ahead and start the anticoagulation so we started that. The patient needs aggressive rehab. He should continue aspirin and also Pradaxa. I am going to start him on small dose Lipitor to help him because of atherosclerotic disease present on the carotid ultrasound.   Atrial fibrillation. Rate is controlled. His heart rate is 69. Seen by cardiologist, Dr. Neoma Laming. The  patient converted to sinus rhythm and became bradycardic. Echocardiogram did not show any thrombus, and the patient received 800 mg of amiodarone initially, and then 400 mg for 3 days. Now he is on 200 mg daily. The patient's heart rate is stable, and the patient has no episodes of bradycardia. His atenolol that he takes at home has been stopped due to bradycardia.   The patient will follow up with Dr. Neoma Laming as an outpatient.   Diabetes mellitus type 2. The patient is on mechanical soft diet as per speech therapy recommendation. Lantus has been started and blood sugars have been like in 200 range. He is on NovoLog 9 units t.i.d. and Lantus will be 35 units daily, and this needs to be adjusted according to blood sugar response.   Hypertension. Blood pressure is controlled. Continue the medications as per the discharge instructions.   CODE STATUS: FULL CODE.  TIME SPENT ON DISCHARGE PREPARATION: More than 30 minutes.   He will follow up with Dr. Benita Stabile,  Dr. Inez Pilgrim and also Dr. Neoma Laming.    ____________________________ Epifanio Lesches, MD sk:jm D: 03/28/2012 12:54:58 ET T: 03/28/2012 13:32:41 ET JOB#: [id number]  cc: Epifanio Lesches, MD, <Dictator> Dionisio David, MD Leona Carry. Hall Busing, MD Simonne Come Inez Pilgrim, MD  Epifanio Lesches MD ELECTRONICALLY SIGNED 04/08/2012 14:00

## 2014-05-28 NOTE — H&P (Signed)
PATIENT NAME:  Jordan [name], Jordan [name] MR#:  [id number] DATE OF BIRTH:  1950-07-15  DATE OF ADMISSION:  06/10/2012  PRIMARY CARE PHYSICIAN: Dr. Benita Stabile.   CHIEF COMPLAINT: Lethargy, weakness and unresponsiveness.   HISTORY OF PRESENT ILLNESS: This is a 64 year old male brought  into the hospital today due to the patient being lethargic and unresponsive by his sister. The patient apparently was in his usual state of health yesterday, alert, awake and oriented. The patient was just recently discharged from [facility] after being sent there after an acute stroke about month and a half ago. He was doing well at home until yesterday. His sister noticed that the patient was a bit more lethargic than usual. This morning when she came to fix some breakfast he was unable to get out of bed. He remained lethargic most of the morning, and therefore she was concerned. She called EMS and he was brought to the hospital.   Upon EMS arrival the patient was noted to have a significantly elevated blood sugar greater than 500, and was also noted to be hypotensive in the field, with systolic blood pressures in the 60s and 70s. The patient was urgently brought to the ER, noted to be in shock, with systolic blood pressures in the 70s. His routine blood work was also consistent with acute diabetic ketoacidosis. Hospitalist services were consulted for further treatment and evaluation.   REVIEW OF SYSTEMS:  CONSTITUTIONAL: No documented fever. Positive weakness. No weight gain. No weight loss.  EYES: No blurry or double vision.  ENT: No tinnitus. No postnasal drip. No redness of the oropharynx.  RESPIRATORY: No cough, no wheeze, no hemoptysis, no dyspnea.  CARDIOVASCULAR: No chest pain, no orthopnea, no palpitations, no syncope.  GASTROINTESTINAL: Positive nausea. No vomiting, no diarrhea. No abdominal pain, no melena or hematochezia.  GENITOURINARY: No dysuria or hematuria.  ENDOCRINE: No polyuria or nocturia. No  heat or cold intolerance.  HEMATOLOGIC: No anemia, no bruising, no bleeding.  INTEGUMENTARY: No rashes. No lesions.  MUSCULOSKELETAL: No arthritis. No swelling. No gout.  NEUROLOGIC: No numbness or tingling. No ataxia. No seizure-type activity.  PSYCHIATRIC: No anxiety. No insomnia. No ADD.   PAST MEDICAL HISTORY: Is consistent with diabetes, hypertension, history of previous CVA, history of chronic atrial fibrillation.   ALLERGIES: No known drug allergies.   SOCIAL HISTORY: Used to be a smoker. Quit in 1992. Does have a 30 pack-year smoking history. No alcohol abuse. No illicit drug abuse. Lives at home by himself.   FAMILY HISTORY: Both mother and father died from complications of heart disease. Both mother and father were also diabetic.   CURRENT MEDICATIONS: Are as follows: Benazepril 20 mg daily, folic acid 1 mg daily, lactulose 30 mL b.i.d., Lantus 35 units at bedtime, NovoLog 9 units t.i.d. with meals, amiodarone 200 mg daily and Pradaxa 150 mg b.i.d.   PHYSICAL EXAMINATION: On admission was as follows: The patient's vital signs were noted to be temperature is 96.4, pulse 107, respirations 18, blood pressure 94/49, sats 99% on room air.   GENERAL: He is a lethargic, but pleasant-appearing male in no apparent distress.  HEENT: Atraumatic, normocephalic. His extraocular muscles are intact. His pupils are equal and reactive to light. His sclerae are anicteric. No conjunctival injection. No pharyngeal erythema. He has dry oral mucosa.  NECK: Supple. There is no jugular venous distention. No bruits, no lymphadenopathy or thyromegaly.  HEART EXAM: Regular rate and rhythm. No murmurs. No rubs. No clicks.  LUNGS: Clear to  auscultation bilaterally. No rales or rhonchi. No wheezes.  ABDOMEN: Soft, flat, nontender, nondistended. He has good bowel sounds. No hepatosplenomegaly appreciated.  EXTREMITIES: No evidence of any cyanosis, clubbing, or peripheral edema. Has +2 pedal and radial pulses  bilaterally.  NEUROLOGICAL: The patient is alert, awake, and oriented x 3. Does have mild slurred speech, which is likely chronic, but no other focal motor or sensory deficits bilaterally.  SKIN: Moist and warm, with no rash appreciated.  LYMPHATIC: There is no cervical or axillary lymphadenopathy.   LABORATORY EXAM: Shows a serum glucose of 885, BUN 51, creatinine 3.07, sodium 130, potassium 6.0, chloride 94, bicarb 7, anion gap of 29.   LFTs are within normal limits. Troponin 0.04, TSH of 2.17.   White cell count 22.9, hemoglobin 10.5, hematocrit 34.4, platelet count of 43.   Urinalysis is within normal limits.   The patient did have an ABG done which showed a PH of 7.13, pCO2 of 19, pO2 of 95, sats of 97%.   The patient did have a CT of the head done without contrast which showed findings that reflect evolving infarct or possible chronic infarct in the left caudate head region. This was better-seen in February 2014 and on subsequent study on 03/27/2012, but does not appear to be dramatically changed.   ASSESSMENT AND PLAN: This is a 64 year old male with a history of previous cerebrovascular accident, diabetes, hypertension, history of chronic atrial fibrillation who was just released from the rehab, comes into the hospital due to lethargy, unresponsiveness, and noted to be in acute diabetic ketoacidosis and hypovolemic shock.   1.  Acute diabetic ketoacidosis: The exact etiology of this is unclear. The patient apparently has been compliant with his insulin, and I do not appreciate any evidence of acute infectious source, although the patient does say that despite taking his insulin his sugars have been running high at the skilled nursing facility and also at home. For now, I would hydrate him aggressively with IV fluids, continue an insulin drip until the anion gap closes. Check serial metabolic profiles. Replace electrolytes accordingly. Will add potassium to his fluids when his potassium  drops below 4.5. I will check a hemoglobin A1c, and the patient will likely need some diabetic teaching prior to discharge.  2.  Shock/hypotension: Again, I am unclear whether this is hypovolemic shock versus septic shock, given the mild leukocytosis, although I do not appreciate any infectious source as the patient's chest x-ray is negative, urinalysis is negative. The patient has received over 5 liters of IV fluids in the Emergency Room and remains hypotensive. Therefore, I will empirically start the patient on Zosyn. Also start Levophed to keep mean arterial pressures (MAPS) greater than 60, follow blood cultures and follow his hemodynamics.  3.  Acute renal failure: This is likely acute tubular necrosis secondary to diabetic ketoacidosis and dehydration and hypotension. I will aggressively hydrate the patient with intravenous fluids, follow BUN and creatinine and urine output. Renal-dose medications. Avoid nephrotoxins. I will hold his benazepril for now.  4.  History of chronic atrial fibrillation: The patient's heart rates are somewhat weak and somewhat controlled right now; therefore, I will continue his amiodarone. I will renally-dose his Pradaxa and change it to 75 b.i.d.  5.  History of previous cerebrovascular accident: Continue Pradaxa for now.  6.  Leukocytosis: Again, the etiology of this is unclear. I do not see any acute infectious source presently. Although I will start the patient empirically on Zosyn he remains persistently hypotensive.  Follow his blood cultures and follow white cell count. This could also be stress-mediated from the underlying diabetic ketoacidosis.   THE PATIENT IS A FULL CODE.   Critical care time spent is 55 minutes.    ____________________________ Belia Heman. Verdell Carmine, MD vjs:dm D: 06/10/2012 14:07:19 ET T: 06/10/2012 14:42:15 ET JOB#: [id number]  cc: Belia Heman. Verdell Carmine, MD, <Dictator> Henreitta Leber MD ELECTRONICALLY SIGNED 06/11/2012 15:27

## 2014-05-28 NOTE — Consult Note (Signed)
Chief Complaint and History:  Referring Physician Dr. Posey Pronto   Chief Complaint Uncontrolled diabetes   Allergies:  Other -Explain in [place]: Other  IVP Dye: Unknown  Assessment/Plan:  Assessment/Plan Patient was seen, examined, and chart was reviewed. This is a 64 yo M with type I diabetes admitted yesterday with DKA. Was treated with IV insulin and then transitioned last night to SQ insulin with Lantus 25 units given at 10 PM and NovoLog SSI. SSI changed today to Regular insulin. He is eating. On 1800 kcal diet. No acute complaints.  A/ DKA, resolved Uncontrolled type 1 diabetes (sugars 200s, A1c 8.1%)  P/ Add prandial coverage with NovoLog 8 units tid AC Change SSI to NovoLog. Agree with increase Lantus to 40 units. He would benefit long-term from learning to carb count and adjust insulin appropriately.  I will follow with you. Full consult will be dictated.   Electronic Signatures: Judi Cong (MD)  (Signed 07-May-14 17:21)  Authored: Chief Complaint and History, ALLERGIES, Assessment/Plan   Last Updated: 07-May-14 17:21 by Judi Cong (MD)

## 2014-05-28 NOTE — H&P (Signed)
PATIENT NAME:  Jordan [name], Jordan [name] MR#:  [id number] DATE OF BIRTH:  1950/11/19  DATE OF ADMISSION:  03/19/2012  PRIMARY CARE PHYSICIAN: Dr. Benita Stabile.   CHIEF COMPLAINT: Altered mental status and confusion.   HISTORY OF PRESENT ILLNESS: This is a 64 year old male who was brought into the hospital due to his slurred speech and confusion and noted to be in rapid atrial fibrillation. The patient has a history of colon cancer, had follow-up film with Dr. Marylene Land office earlier today. The sister of the patient was trying to get in touch with him to see if he had made it to the appointment and when she spoke to him she thought that he did not sound right,  where he had some slurred speech. She thought it was related to his hypoglycemia because it had happened before. Although she attempted to tell him to try to eat or drink something, her phone got cut off and she tried to call him multiple times, but he would not answer the phone. She then called the security at the hospital and asked for them to look for the patient somewhere around the hospital. A Code 250 called and the patient was found unresponsive in his car, but he was alert. He was urgently brought to the ER and noted to be in SVT and rapid atrial fibrillation with heart rates in the 160s. He also had some slurred speech and was confused. He received multiple doses of IV Cardizem, which seemed to have converted him to a sinus rhythm, although his CT scan of his head was suggestive of an acute stroke. Hospital services were contacted for further treatment and evaluation.   REVIEW OF SYSTEMS: Otherwise unobtainable, given the patient's confusion and aphasia.   PAST MEDICAL HISTORY: Is consistent with hypertension, history of paroxysmal atrial fibrillation, diabetes, colon cancer.   ALLERGIES: No known drug allergies.   SOCIAL HISTORY: No smoking. No alcohol abuse. No illicit drug abuse. Lives by himself.   FAMILY HISTORY: He cannot recall his family  history given his mental status.   MEDICATIONS:  This is not accurate, he cannot tell me. This is based off the computer. Benazepril 20 mg daily, folic acid 1 mg daily, Humalog 5 units with each meal per sliding scale, Humalog NPH 17 units in the morning and 10 units in the evening, metoprolol tartrate 12.5 mg b.i.d., multivitamin daily.   PHYSICAL EXAMINATION:  Presently is as follows:   VITAL SIGNS:  Are noted to be: Temperature 98, pulse 57, respirations 18, blood pressure 116/59, sats 98% on room air.  GENERAL: He is a pleasant appearing male who is aphasic, but in no apparent distress.  HEENT: He is atraumatic, normocephalic. His extraocular muscles are intact. Pupils equal, round and reactive to light. Sclerae is anicteric. No conjunctival injection. No pharyngeal erythema.  NECK: Supple. No jugular venous distention, no bruits, no lymphadenopathy, no thyromegaly.  HEART: Regular rate and rhythm. No murmurs, rubs, or clicks.  LUNGS: Clear to auscultation bilaterally. No rales, no rhonchi, no wheezes.  ABDOMEN: Soft, flat, nontender, nondistended. Has good bowel sounds. No hepatosplenomegaly appreciated.  EXTREMITIES: No evidence of any cyanosis, clubbing, or peripheral edema. Has +2 pedal and radial pulses bilaterally.  NEUROLOGICAL: The patient is alert, awake, and oriented x 1. He does have about 3/5 strength in the right upper and lower extremities, compared to the left. Babinski's are  upgoing on the left right, downgoing on the left. Reflexes are +2 bilaterally. No other focal motor or sensory  deficits appreciated bilaterally.  SKIN: Moist and warm with no rashes.  LYMPHATIC: There is no cervical or axillary lymphadenopathy.   LABORATORY, DIAGNOSTIC, AND RADIOLOGICAL DATA: Shows serum glucose 123, BUN 27, creatinine 1.1, sodium 139, potassium 4.1, chloride 106, bicarbonate 26. LFTs within normal limits. Troponin less than 0.02. TSH 1.2. White cell count 12.2, hemoglobin 14.5, hematocrit  42.8, platelet count 322. INR 0.9. Urinalysis is within normal limits.   The patient did have a CT of the head done without contrast showing findings concerning for acute infarcts involving the left caudate head and medial aspect of the left basal ganglia. The origin of the left middle cerebral artery is relatively hyperdense. Thrombus within the vessel cannot be excluded.   ASSESSMENT AND PLAN: This is a 64 year old male with history of colon cancer, history of diabetes, paroxysmal atrial fibrillation, hypertension, who presents to the hospital with altered mental status, confusion and also with an aphasic speech. Noted to be in rapid atrial fibrillation. Also CT scan suggestive of an acute CVA.    1.  Altered mental status/confusion. Most likely cause of this is related to his acute cerebrovascular accident and aphasia. There is no evidence of any other metabolic or infectious etiology. We will follow his mental status closely.  2.  Acute cerebrovascular accident. This is likely an ischemic cerebrovascular accident related to his paroxysmal atrial fibrillation. The patient was not on any anticoagulation prior to coming in. I discussed the case with neurologist, Dr. Melrose Nakayama, over the phone. There is no urgent indication to give the patient heparin at this point. Therefore, I will start the patient on aspirin for now and get an MRI of his brain tomorrow, get a carotid duplex and two-dimensional echo. We will get speech, physical therapy and occupational therapy evaluation. We will also get a neurology consult as mentioned.  3.  Atrial fibrillation. This is paroxysmal in nature. He presented with significantly uncontrolled heart rates in the 160s. He received multiple doses of IV Cardizem and since then has converted to sinus rhythm. For now I will place him on low dose p.o. metoprolol for rate control. Since his blood pressure seems to the lower side, I would avoid giving any further rate controlling meds  that causes further hypotension. I will use digoxin if needed. The patient likely needs to be on long-term anticoagulation, given his now acute cerebrovascular accident. I will get a cardiology consult to further discuss with the patient. The patient has been seen by Dr. Neoma Laming in the past. I will also get a two-dimensional echocardiogram as mentioned,  4.  Diabetes. I will place him on sliding scale insulin for now and follow blood sugars.  5.  Hypertension, presently hemodynamically stable. Continue low-dose metoprolol.  6.  History of colon cancer. This is currently in remission. The patient follows up with Dr. Inez Pilgrim.   CODE STATUS: The patient is a full code.   TIME SPENT ON ADMISSION: 50 minutes.    ____________________________ Belia Heman. Verdell Carmine, MD vjs:cc D: 03/19/2012 21:42:55 ET T: 03/19/2012 22:46:29 ET JOB#: [id number]  cc: Belia Heman. Verdell Carmine, MD, <Dictator> Henreitta Leber MD ELECTRONICALLY SIGNED 03/23/2012 20:55

## 2014-05-28 NOTE — Consult Note (Signed)
PATIENT NAME:  Jordan [name], Jordan [name] MR#:  [id number] DATE OF BIRTH:  May 22, 1950  DATE OF CONSULTATION:  03/21/2012  CONSULTING PHYSICIAN:  Dionisio David, MD  HISTORY OF PRESENT ILLNESS:  This is a 64 year old white male with a past medical history of paroxysmal atrial fibrillation, who came into the Emergency Room with A. fib with rapid rate. He was given IV amiodarone. He has converted to sinus rhythm. Because of bradycardia, amiodarone was stopped and was switched to p.o. amiodarone 200 a day. He has right-sided weakness due to the stroke.   PAST MEDICAL HISTORY:   History of hypertension, history of paroxysmal atrial fibrillation, diabetes, colon cancer.   SOCIAL HISTORY:  No EtOH abuse. No smoking.   FAMILY HISTORY:  Positive for coronary artery disease.   PHYSICAL EXAMINATION: GENERAL:  He is alert, oriented x3, in no acute distress right now, but right-sided weakness.  NECK:  Exam revealed no JVD.  LUNGS:  Clear.  HEART:  Regular rate and rhythm. Normal S1, S2. No audible murmur.  ABDOMEN:  Soft, nontender, positive bowel sounds.  EXTREMITIES:  No pedal edema.  NEUROLOGIC:  Right-sided hemiparesis.   ASSESSMENT AND PLAN:  Status post cerebrovascular accident with right hemiparesis. CT showed infarct in the left caudate and medial aspect of the left basal ganglia. There was no thrombus seen in the LV on echocardiogram or the left atrium. I advise continuation of amiodarone p.o. and physical therapy.    Thank you very much for the referral.   ____________________________ Dionisio David, MD sak:dm D: 03/21/2012 09:17:24 ET T: 03/21/2012 09:36:33 ET JOB#: [id number]  cc: Dionisio David, MD, <Dictator> Dionisio David MD ELECTRONICALLY SIGNED 03/24/2012 9:04

## 2014-05-28 NOTE — Discharge Summary (Signed)
PATIENT NAME:  Jordan [name] MR#:  [id number] DATE OF BIRTH:  1950-09-19  DATE OF ADMISSION:  06/10/2012 DATE OF DISCHARGE:  06/13/2012  For a detailed note, please take a look at the history and physical done on admission.   DIAGNOSES AT DISCHARGE:  Is as follows:  1.  Acute diabetic ketoacidosis, now resolved.  2.  Acute renal failure.   3.  Hypovolemic shock.  4.  Chronic atrial fibrillation.  5.  Hypertension.  6.  Leukocytosis.   DIET:  The patient is being discharged on a low-sodium, low-fat, American Diabetic Association diet.   ACTIVITY:  As tolerated.   FOLLOW-UP:  Is with Dr. Benita Stabile in the next 1 to 2 weeks.   DISCHARGE MEDICATIONS:  Folic acid 1 mg daily, benazepril 20 mg 1 tab daily, Pradaxa 150 mg twice daily, amiodarone 400 mg daily, glargine insulin 40 units subcutaneous at bedtime and aspart insulin 10 units 3 times a day with meals.   CONSULTANTS DURING THE HOSPITAL COURSE:  Dr. Lavone Orn from endocrinology.  Dr. Neoma Laming from cardiology.   PERTINENT STUDIES DONE DURING THE HOSPITAL COURSE:  Are as follows:  A CT scan of the head done without contrast on admission showing findings may reflect evolving infarct or possible chronic infarct in the left caudate head region.  A chest x-ray showing findings representing asymmetric pulmonary edema.   BRIEF HOSPITAL COURSE:  This is a 64 year old male with medical problems as mentioned above, presented to the hospital on 06/10/2012 as he was found lethargic and unresponsive at his home and noted to be in acute diabetic ketoacidosis and in hypovolemic shock.  1.  Acute diabetic ketoacidosis.  This was likely the cause of patient's lethargy and unresponsiveness complicated with underlying hypotension.  The patient apparently had been taking his insulin, but his sugars continued to be high.  He was just recently discharged home from a skilled nursing facility.  There was no evidence of any acute infectious source leading  to his diabetic ketoacidosis, although he was empirically treated with IV Zosyn for two days as the source of his hypotension was unclear at that time.  The patient was placed on an insulin drip, also given aggressive IV fluids.  Serial metabolic profiles were obtained.  Since admission the patient's DKA has now resolved.  His anion gap has closed.  His sugars have been maintained well on Lantus and sliding scale coverage and NovoLog with meals.  An endocrinology consult was obtained.  The patient was seen by Dr. Gabriel Carina.  She recommended follow-up as an outpatient, although patient does not want to see her, but wants to see Dr. Hall Busing as an outpatient.  She did give him some information on counting his carbohydrates and dosing his insulin accordingly.  Presently, patient is taking by mouth well.  His blood sugars are stable on the Lantus and Humalog with meals, which he was continued upon discharge.  2.  Shock.  The patient presented with significantly low blood pressures with systolic blood pressures in the 60s to 70s.  The exact source of the shock was unclear.  Initially it was thought to be a combination of hypovolemic and possible septic shock.  Empirically patient was started on IV Zosyn.  Also given aggressive IV fluids.  His blood cultures, urine cultures have been negative, therefore the likely source of his shock was hypotension and from the DKA.  Presently he is hemodynamically stable and doing well.  He was maintained in the intensive  care unit for 48 hours on IV vasopressors, which have now been discontinued.  3.  Acute renal failure.  This was likely secondary to acute tubular necrosis from the septic shock and dehydration from the DKA.  After getting aggressive IV fluids the patient's renal function has now come back to baseline.   4.  Chronic atrial fibrillation.  The patient was in the intensive care unit, maintained on a Cardizem drip intermittently given his significant tachycardia and  uncontrolled heart rates.  A cardiology consult was obtained.  The patient was already on amiodarone prior to coming in.  He was loaded up with amiodarone again while here in the hospital.  His heart rate then converted to a sinus rhythm and he has been maintained well on a higher dose of amiodarone with no other rate-controlling meds.  Presently he will continue his amiodarone at 400 mg daily and continue his Pradaxa.  5.  History of previous CVA.  The patient was continued his Pradaxa as stated.  He had a stroke in February of this past year.  He was discharged to a skilled nursing facility.  He has now been discharged from there.  He will continue home health, PT and occupational therapy as stated.  6.  Leukocytosis.  Initially it was thought to be related to a sepsis, although sepsis has now been ruled out given the fact that his blood cultures are negative.  His chest x-ray is negative and his urinalysis was negative.  His white cell count has now normalized, therefore this was likely stress mediated from the diabetic ketoacidosis.   CODE STATUS:  The patient is a FULL CODE.   DISPOSITION:  He is being discharged home with home health, physical therapy, occupational therapy and nursing services.    TIME SPENT:  45 minutes.    ____________________________ Belia Heman. Verdell Carmine, MD vjs:ea D: 06/13/2012 13:45:42 ET T: 06/14/2012 05:36:32 ET JOB#: [id number]  cc: Belia Heman. Verdell Carmine, MD, <Dictator> Leona Carry. Hall Busing, MD Henreitta Leber MD ELECTRONICALLY SIGNED 06/24/2012 19:55

## 2014-05-28 NOTE — Consult Note (Signed)
PATIENT NAME:  Jordan [name], Jordan [name] MR#:  [id number] DATE OF BIRTH:  [birth year]  DATE OF CONSULTATION:  06/12/2012  CONSULTING PHYSICIAN:  Dionisio David, MD  INDICATION FOR CONSULTATION: Atrial fibrillation.   HISTORY OF PRESENT ILLNESS: This is a 64 year old white male with a past medical history of diabetes, history of atrial fibrillation, hypertension, history of prior CVA, who stopped following in the office for 1 year, who came into the hospital with a funny feeling in his chest. He felt tired in his chest, that is what he felt. Apparently, he was feeling weak, lethargic, and was found unresponsive, but he says he just felt tired in his chest. No palpitations, no chest pain. EMS actually brought him to the hospital. His blood sugar at that time was actually very high, 500, and was hypotensive with systolic blood pressure of 60 to 70. He was found to be in atrial fibrillation with rapid ventricular response rate. Was treated initially with Cardizem, converted to sinus rhythm. Right now, is on Cardizem as well as amiodarone. Right now, he is feeling much better. He denies any chest pain, palpitations, and he denies that feeling of tiredness in his chest.   PAST MEDICAL HISTORY: As mentioned, history of diabetes, hypertension, history of CVA, history of chronic atrial fibrillation.   SOCIAL HISTORY: He quit smoking in 1992. No EtOH abuse.   FAMILY HISTORY: Positive for coronary artery disease.   HOME MEDICATIONS: Benazepril, Lantus insulin, NovoLog insulin and amiodarone as well as Pradaxa. He was taking amiodarone 200 a day and Pradaxa 150 b.i.d.   PHYSICAL EXAMINATION:  GENERAL: He is alert, oriented x3, in no acute distress right now.   VITAL SIGNS: His temperature is 98.6, pulse right now 64, respirations 19, blood pressure 110/74, pulse oximetry is 97.  NECK: No JVD.  LUNGS: Clear.  HEART: Regular rate and rhythm. Normal S1, S2. No audible murmur.  ABDOMEN: Soft, nontender. Positive bowel  sounds.  EXTREMITIES: No pedal edema.   ELECTROCARDIOGRAM: There is no EKG in the chart, but the monitor shows the patient is in sinus rhythm about 70 beats per minute. Will get an EKG.   LABORATORY DATA: His BUN and creatinine is 42/2.34, glucose is down to 279. His troponin was 0.04.   ASSESSMENT AND PLAN: The patient has atrial fibrillation with rapid ventricular response rate. He is right now back to sinus rhythm. Will discontinue Cardizem, change to amiodarone 400 a day. Continue Pradaxa. Once his sugar is controlled and is doing well, he can be discharged with followup in the office.   Thank you very much for referral.   ____________________________ Dionisio David, MD sak:OSi D: 06/12/2012 08:46:08 ET T: 06/12/2012 09:08:16 ET JOB#: [id number]  cc: Dionisio David, MD, <Dictator> Dionisio David MD ELECTRONICALLY SIGNED 07/05/2012 13:18

## 2014-05-28 NOTE — Consult Note (Signed)
Consult done , briefely patient had afib with RVR and developed right hemiparesis due to CVA. Echo didnot show thrombii in LA/LV. But probably dislodged thrombus and now nothing there or have protruding plaque in aortic arch. May have to do TEE later.  Electronic Signatures: Angelica Ran (MD)  (Signed on 14-Feb-14 09:20)  Authored  Last Updated: 14-Feb-14 09:20 by Angelica Ran (MD)

## 2014-05-30 NOTE — Consult Note (Signed)
PATIENT NAME:  Jordan [name], Jordan [name] MR#:  [id number] DATE OF BIRTH:  [birth year]  DATE OF CONSULTATION:  07/01/2011  REFERRING PHYSICIAN:   CONSULTING PHYSICIAN:  Jerrol Banana. Burt Knack, MD  CHIEF COMPLAINT: Right colon mass.   HISTORY OF PRESENT ILLNESS: I was asked to see this patient by Dr. Candace Cruise who has just performed a colonoscopy and identified multiple colon polyps, one of which is circumferential and likely near obstructing and likely malignant. I was asked to see the patient for this problem and to plan surgical intervention.   The patient describes two days of bloody and dark stools. He has been feeling weak, however, for several years, had never had a colonoscopy before, and recently retired early partially due to him being tired all the time. He worked for the [employer] and worked long hours and thought that he was tired at the end of the day more so than he should have been but never sought out additional medical care.  He had some lightheadedness and has been transfused 1 unit. He denies weight loss, denies fevers, chills, nausea or vomiting and denies any abdominal pain.   PAST MEDICAL HISTORY:  1. Hypertension. 2. Diabetes. 3. Heart palpitations.   PAST SURGICAL HISTORY: Tonsillectomy and adenoidectomy. Denies any abdominal surgery.   ALLERGIES: None.   MEDICATIONS: Multiple, see chart.   FAMILY HISTORY: Noncontributory.   SOCIAL HISTORY: The patient does not smoke or drink. He is retired from the [employer].   REVIEW OF SYSTEMS: 10 system was reviewed and negative with the exception of that mentioned in the history of present illness.   PHYSICAL EXAMINATION:   GENERAL: Healthy Caucasian male patient.   VITAL SIGNS: Temperature 98.8, pulse 73, respirations 18, blood pressure 103/58 with a pulse oximetry of 98% on room air.   HEENT: No scleral icterus.   NECK: No palpable neck nodes.   CHEST: Clear to auscultation.   CARDIAC: Regular  rate and rhythm.   ABDOMEN: Soft and nontender. No scars are noted.   EXTREMITIES: Without edema. Calves are nontender.   NEUROLOGIC: Grossly intact.   INTEGUMENT: No jaundice.  Review of the colonoscopy report demonstrates multiple polyps in the ascending colon and transverse colon at the hepatic flexure.   After reviewing the notes, I also discussed with Dr. Candace Cruise his findings. A large polyp in the ascending colon, a cecal polyp, and a hepatic flexure polyp which was quite large partially removed and tattooed. The more distal small polyp was completely removed, not tattooed, and not a concern according to Dr. Candace Cruise. Pathology is currently pending.   LABORATORY VALUES: Hemoglobin and hematocrit 8.8 and 27% with a platelet count of 318. No recent electrolytes performed since the 24th at which time his creatinine was normal.   ASSESSMENT AND PLAN: This is a patient with anemia secondary to colon polyps basically centered in the right colon and cecum. I spoke to Dr. Candace Cruise and in concert with his recommendations I am recommending an extended right hemicolectomy to the middle colic vessels in an attempt at removing this partially removed polyp at the hepatic flexure which has been tattooed. Dr. Candace Cruise did not believe that there was a problem with this other polyp which has been removed more distal to that and it was quite small, but in all likelihood it will not be palpable but would be encompassed and atypical extended right hemicolectomy specimen.   The rationale for surgery has been discussed with the patient. The procedure has  been described and the risks of bleeding, infection, transfusion, anastomosis, anastomotic leak, and temporary or permanent ostomy has been reviewed with him. He understood and agreed with this plan and I will attempt to schedule this tomorrow instituting VTE prophylaxis and perioperative antibiotics and I've discussed this with Dr. Candace Cruise.   ____________________________ Jerrol Banana. Burt Knack,  MD rec:drc D: 07/01/2011 12:04:00 ET T: 07/01/2011 12:25:19 ET JOB#: [id number]  cc: Jerrol Banana. Burt Knack, MD, <Dictator> Florene Glen MD ELECTRONICALLY SIGNED 07/01/2011 16:09

## 2014-05-30 NOTE — Consult Note (Signed)
Chief Complaint:   Subjective/Chief Complaint Colon cancer s/p surgery. NG in place. Complaining of weakness and some abdominal discomfort.   VITAL SIGNS/ANCILLARY NOTES: **Vital Signs.:   28-May-13 11:48   Vital Signs Type Routine   Temperature Temperature (F) 98.7   Celsius 37   Temperature Source oral   Pulse Pulse 68   Pulse source per Dinamap   Respirations Respirations 18   Systolic BP Systolic BP 462   Diastolic BP (mmHg) Diastolic BP (mmHg) 61   Mean BP 80   BP Source doppler   Pulse Ox % Pulse Ox % 96   Pulse Ox Activity Level  At rest   Oxygen Delivery Room Air/ 21 %   Routine Hem:  28-May-13 05:08    WBC (CBC) 18.2   RBC (CBC) 2.91   Hemoglobin (CBC) 8.4   Hematocrit (CBC) 25.5   Platelet Count (CBC) 365   MCV 87   MCH 28.8   MCHC 32.9   RDW 14.1  Routine Chem:  28-May-13 05:08    Glucose, Serum 267   BUN 14   Creatinine (comp) 1.04   Sodium, Serum 141   Potassium, Serum 4.4   Chloride, Serum 106   CO2, Serum 16   Calcium (Total), Serum 8.2   Osmolality (calc) 291   eGFR (African American) >60   eGFR (Non-African American) >60   Anion Gap 19  Routine Hem:  28-May-13 05:08    Neutrophil % 85.8   Lymphocyte % 5.6   Monocyte % 8.5   Eosinophil % 0.0   Basophil % 0.1   Neutrophil # 15.6   Lymphocyte # 1.0   Monocyte # 1.6   Eosinophil # 0.0   Basophil # 0.0  Blood Glucose:  28-May-13 08:30    POCT Blood Glucose 286    11:54    POCT Blood Glucose 228    17:36    POCT Blood Glucose 213   Assessment/Plan:  Assessment/Plan:   Assessment Colonic mass s/p resection.    Plan Further recomendations per general surgery and oncology. Will follow as OP (orders written). Will sign off. Please call me if needed. Thanks.   Electronic Signatures: Jill Side (MD)  (Signed [phone] 18:08)  Authored: Chief Complaint, VITAL SIGNS/ANCILLARY NOTES, Lab Results, Assessment/Plan   Last Updated: 28-May-13 18:08 by Jill Side (MD)

## 2014-05-30 NOTE — H&P (Signed)
PATIENT NAME:  Jordan [name], Jordan [name] MR#:  [id number] DATE OF BIRTH:  11-17-1950  DATE OF ADMISSION:  06/28/2011  REFERRING PHYSICIAN: Dr. Prince Rome. PRIMARY CARE PHYSICIAN:  Dr. Benita Stabile.   CHIEF COMPLAINT: "I have been having bloody diarrhea."  HISTORY OF PRESENT ILLNESS:  The patient is a pleasant 64 year old male with past medical history as listed below including hypertension, diabetes mellitus, who presents to the Emergency Department with the above-mentioned chief complaint. The patient states that two days ago he developed bloody diarrhea mixed with dark stool and associated with nausea, but no abdominal pain. He also reports subjective fevers. He has had six episodes of bloody diarrhea mixed with dark stool yesterday and twice today for which he came to the ER for further evaluation. He also reports generalized weakness and fatigue, and decreased energy over the past 1 to 2 months. He denies any chest pain or shortness of breath. He denies frequent or excessive use of NSAIDs or alcohol. He denies any recent antibiotic use. He came to the ER for further evaluation of his symptoms and was noted to be anemic with hemoglobin down to 8.3. Additionally, he was presented with new onset atrial fibrillation with rapid ventricular response, heart rate 170s and for which he received 20 mg of IV Cardizem and, thereafter, he converted to normal sinus rhythm and has remained in sinus rhythm since with heart rate in the 70s. He denies any heart palpitations or chest pain as above. Otherwise, he is currently without any specific complaints at this time. He states that his stool resembles strawberry Kool-Aid. Otherwise, he is without specific complaints at this time. He also has evidence of acute renal failure with elevated BUN and creatinine. Given all these abnormalities and the patient's symptoms, hospitalist services were contacted for further evaluation and for hospital admission.   PAST SURGICAL HISTORY:  Tonsillectomy.   PAST MEDICAL HISTORY:    1. Hypertension. 2. 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  3. History of heart palpitations in the past of unclear etiology. The patient states he has never seen a cardiologist.   ALLERGIES: No known drug allergies.   HOME MEDICATIONS:  1. Benazepril 20 mg daily.  2. Metoprolol tartrate 12.5 mg daily. 3. Folic acid 1 mg daily. 4. Humulin N 17 units subcutaneous in the morning and 10 units subcutaneous at bedtime. Humalog. He takes sliding scale insulin before each meal, usually 5 units.  5. Clear Eyes Plus redness relief, 0.012% ophthalmic gel forming solution one drop to both eyes. 6. Multivitamin 1 tablet p.o. daily.   FAMILY HISTORY: The patient's mother deceased at age 80 secondary to myocardial infarction. Father deceased at age 31 secondary to myocardial infarction. Both parents had hypertension, diabetes mellitus and myocardial infarctions.   SOCIAL HISTORY: Negative for tobacco, alcohol or illicit drug use. The patient lives in [place], [place] at home by himself. He is retired. He has no children. He comes accompanied by one sister and one brother.   REVIEW OF SYSTEMS: CONSTITUTIONAL: Had some subjective fevers. Reports fatigue and generalized weakness and decreased energy, but denies any pain at the moment. Denies any recent changes in weight. HEAD/EYES: Denies headache or blurred vision. ENT: Denies tinnitus, earache, nasal discharge, or sore throat. RESPIRATORY: Denies shortness of breath, cough, or wheezing. CARDIOVASCULAR: Denies chest pain or lower extremity edema. Has had some heart palpitations in the recent past, none currently. GASTROINTESTINAL: Reports nausea. Denies vomiting. Reports bloody diarrhea and hematochezia and also some dark stool. Denies any abdominal  pain. GENITOURINARY: Denied dysuria or hematuria. ENDOCRINE: Denies heat or cold intolerance. HEME/LYMPH: Denies easy bruising. Reports blood in his stool.  INTEGUMENTARY: Denies rash or lesions. MUSCULOSKELETAL: Denies joint pain or back pain currently. NEUROLOGIC: Has generalized weakness. Denies headache, numbness, tingling, or dysarthria. PSYCHIATRIC: Denies depression or anxiety. Denies suicidal or homicidal ideation.   PHYSICAL EXAMINATION:  VITAL SIGNS: Temperature 95.9, pulse 67, blood pressure 122/56, respirations 18, oxygen saturation 100% on room air.   GENERAL: Pleasant male, alert and oriented, not in acute distress.   HEENT: Normocephalic, atraumatic. Pupils are equal, round and reactive to light and accommodation. Extraocular movements intact. Anicteric sclerae. Bilateral conjunctival pallor. Hearing intact to voice. Nares without drainage. Oral mucosa moist without lesions.   NECK: Supple with full range of motion. No jugular venous distention, lymphadenopathy, or carotid bruits bilaterally. No thyromegaly or tenderness to palpation over thyroid gland.   CHEST: Normal respiratory effort without user of accessory respiratory muscles. Lungs are clear to auscultation bilaterally without crackles, rales, or wheezes.   CARDIOVASCULAR: S1, S2 positive. Regular rate and rhythm. No murmurs, rubs, or gallops. PMI is non-lateralized.   ABDOMEN: Soft, nontender, nondistended. Normoactive bowel sounds. No hepatomegaly or palpable masses. No hernias.   EXTREMITIES: No clubbing, cyanosis, or edema. Pedal pulses are palpable bilaterally.   SKIN: No suspicious rashes. Skin turgor is good. Subungual pallor and generalized pallor.   LYMPH: No cervical lymphadenopathy.   NEUROLOGIC: Alert and oriented x3. Cranial nerves 2-12 grossly intact. No focal deficits.   PSYCH: Pleasant male with appropriate affect.   LABORATORY, DIAGNOSTIC, AND RADIOLOGICAL DATA: EKG #1 upon arrival: Atrial fibrillation with rapid ventricular response, heart rate 174 beats per minute with normal axis, nonspecific ST and T wave changes. EKG #2 after 20 mg IV Cardizem:  Normal sinus rhythm, heart rate 72 beats per minute with normal axis, normal intervals, no acute ST or T wave changes. BMP: Sodium 133, potassium 4.5, chloride 102, bicarbonate 22, BUN 27, creatinine 1.36, glucose 224. Calcium 8.3, anion gap 9. LFTs within normal limits except albumin slightly low at 3.2, INR 1.1. CBC within normal limits except for hemoglobin 8.3, hematocrit 24.5, platelet count is normal. WBC count is normal at 8.8, platelet count was 347. MCV is 86. Urinalysis: Clear urine, greater than 500 mg/dL of glucose, 1+ ketones, specific gravity 1.006, negative nitrite, leukocyte esterase, negative protein and blood, one RBC, one WBC, no bacteria.   ASSESSMENT AND PLAN: 64 year old male with past medical history of hypertension, diabetes mellitus, history of heart palpitations, here with: 1. Bloody diarrhea and dark stools concerning for gastrointestinal bleed. Recommend hospital admission for further evaluation and management. We will admit the patient to the telemetry unit given atrial fibrillation that was noted. Not sure if this is upper versus lower gastrointestinal bleed, but suspects this may be lower given bloody diarrhea. In the event of upper gastrointestinal bleed, will start the patient on IV Protonix, but he denies any use of NSAIDs or alcohol or any ulcerogenic medications. He will also be started on volume resuscitation with IV fluids. For possible lower gastrointestinal bleed given bloody diarrhea, will obtain stool studies including stool culture, fecal leukocytes, stool for C. difficile toxin as well as stool for occult blood. Since the patient has symptomatic anemia, recommend transfusion of one unit of PRBCs for now and we will reassess thereafter. Otherwise, will obtain serial hemoglobin and hematocrit checks and monitor for further bleeding. The patient will be started on clear liquid diet for now and will obtain  gastrointestinal consultation for further recommendations. Of note,  given bloody diarrhea, colitis would also be a concern. However, he denies any abdominal pain at this time and has normal white blood cell count and could consider antibiotics, but we will proceed with a CT of the abdomen first to look for signs of colitis and if this is unremarkable, we will hold off on antibiotic therapy for now and we will await further recommendations from gastroenterology. I have personally spoken with Dr. Dionne Milo whose help is greatly appreciated and he will be seeing the patient in consultation.  2. Acute posthemorrhagic anemia, due to gastrointestinal bleed as above and the patient will be transfused 1 unit of PRBCs for now as he is symptomatic and, thereafter, will be reassessed and will continue serial hemoglobin and hematocrit checks in the meanwhile and watch for further bleeding.  3. New onset atrial fibrillation with RVR. This could be driven by anemia. Will keep the patient on telemetry. Obtain 2-D echocardiogram and cycle his cardiac enzymes. Will attempt to achieve heart rate control with metoprolol for now as he takes this at home, but he takes the short-acting metoprolol at home and we will increase his dose from his baseline and change it to metoprolol 12.5 mg every 12 hours rather than daily. Will obtain cardiology consultation for further recommendations. The patient is currently in normal sinus rhythm. He may have had paroxysmal atrial fibrillation and his known chads-2 score is 2 and would likely benefit from aspirin therapy for his stroke prophylaxis, but would not give any antiplatelets or anticoagulants for now given his acute gastrointestinal bleed and anemia. Further work-up and management to follow depending on the patient's clinical course.  4. Acute renal failure, suspect prerenal etiology from volume depletion caused by bloody diarrhea. We will avoid and hold nephrotoxins for now including his ACE inhibitor therapy (benazepril). We will monitor ins and outs  strictly and follow the patient's renal function closely with hydration/IV fluids.  5. Hypertension. Continue metoprolol for now. Monitor blood pressure closely. Hold benazepril given acute renal failure.  6. 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 Hold his scheduled insulin for now until he is started on a diet and will keep him on sliding scale insulin and check hemoglobin A1c.  7. Deep vein thrombosis prophylaxis. SCDs and TEDs.  CODE STATUS: FULL CODE.   TIME SPENT ON THIS ADMISSION: Approximately 60 minutes.    ____________________________ Romie Jumper, MD knl:ap D: 06/28/2011 16:29:06 ET T: 06/28/2011 17:10:09 ET JOB#: [id number]  cc: Romie Jumper, MD, <Dictator> Leona Carry Hall Busing, MD Jill Side, MD Dionisio David, MD Romie Jumper MD ELECTRONICALLY SIGNED 07/16/2011 19:38

## 2014-05-30 NOTE — Discharge Summary (Signed)
PATIENT NAME:  Jordan [name], Jordan [name] MR#:  [id number] DATE OF BIRTH:  01-05-1951  DATE OF ADMISSION:  06/28/2011 DATE OF DISCHARGE:  07/06/2011  For a detailed note, please take a look at the history and physical done on admission by Dr. Dagoberto Ligas.   DISCHARGE DIAGNOSES: 1. Gastrointestinal bleed secondary to colon cancer status post left hemicolectomy.  2. New onset atrial fibrillation.  3. Acute renal failure.  4. Diabetes.   DIET: The patient is being discharged on an American Diabetic Association, low sodium, low fat diet.   ACTIVITY: As tolerated.  DISCHARGE FOLLOWUP:  Follow-up with Dr. Inez Pilgrim at the [facility] in the next 1 to 2 weeks, follow up with Dr. Neoma Laming in the next 1 to 2 weeks, and followup with Dr. Phoebe Perch from general surgery in the next 1 to 2 weeks.  DISCHARGE MEDICATIONS: 1. Metoprolol tartrate 12.5 mg twice a day. 2. Benazepril 40 mg 1/2 tab daily.  3. Humulin NPH 17 units in the morning, 10 units at bedtime.  4. Humalog 5 units three times daily with meals.  5. Multivitamin daily.  6. Folic acid 1 mg daily.  7. Amiodarone 200 mg daily.   CONSULTANTS: 1. Phoebe Perch, MD -  General Surgery.  2. Jill Side, MD - Gastroenterology. 3. Gaylyn Cheers, MD - Gastroenterology. [place], MD - Oncology.   PERTINENT STUDIES: CT scan of the abdomen and pelvis done on 06/28/2011 showed areas of abnormal attenuation with opacified portions of the right colon. Correlate with colonoscopy.   Chest x-ray on 07/01/2011 showed changes consistent with chronic obstructive pulmonary disease, but no other acute cardiopulmonary disease.   Two-dimensional echocardiogram done on 06/29/2011 showed an ejection fraction 55%. No thrombi seen in the left atrium or left ventricle. Mild mitral regurgitation.   Colonoscopy done by Dr. Verdie Shire on 07/01/2011 showed likely malignant partial obstructing tumor in the ascending colon which was biopsied, likely  malignant tumor in the cecum which was also biopsied. One small polyp in the cecum and one large polyp at the hepatic flexure.   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   HOSPITAL COURSE: This is a 64 year old male with medical problems as mentioned above who presented to the hospital initially secondary to bloody stools and also noted to be in new onset atrial fibrillation.  1. GI bleed: This was likely a lower GI bleed. The patient had a CT scan of the abdomen and pelvis done which showed a suspicious mass in the descending colon. The patient was transfused 2 units of packed red blood cells and hemoglobin did improve. The patient was seen in consultation by gastroenterology and underwent a colonoscopy which showed an ascending colon mass along with a polyp in the hepatic flexure. The pathology was consistent with adenocarcinoma. A general surgical consult was obtained. The patient was seen by Dr. Burt Knack. The patient underwent an extensive right-sided hemicolectomy on 07/02/2011. Postoperatively, the patient had a NG tube for two days which was shortly then removed as he started passing flatus and had less abdominal pain. His diet was eventually advanced from a liquid to a mechanical soft diet which he has been able to tolerate and has had no worsening pain or diarrhea. The patient now is being discharged home with close follow-up with general surgery and also oncology as an outpatient.  2. New onset atrial fibrillation: When the patient presented to the hospital, he was noted to be tachycardic with new  onset atrial fibrillation. The patient was initially in the Intensive Care Unit on a Cardizem drip which was eventually tapered off. The patient was also tried on an amiodarone drip and converted to a sinus rhythm. He was then transferred to the telemetry floor. He had some intermittent tachycardia, but after being placed on p.o. amiodarone he has  remained in a sinus rhythm. Because of his recent surgery, he currently has not been put on any anticoagulation. His echocardiogram showed normal LV function. For now he is being discharged on some rate control with p.o. amiodarone and metoprolol with close follow-up with cardiology as an outpatient. The patient will follow-up with Dr. Neoma Laming.  3. Colon cancer: The patient did undergo extensive right hemicolectomy. His CEA levels were normal. The patient was seen by Dr. Inez Pilgrim. He will follow up with him on 07/16/2011. The patient likely will need an outpatient PET scan for staging purposes to see if he needs adjuvant chemotherapy or radiation.  4. Acute renal failure. This was likely secondary to volume loss and anemia. After getting IV fluids and transfusions the patient's creatinine was back down to baseline with no acute issues.  5. Diabetes. Since the patient was extensively n.p.o. most of the hospital course, he was maintained on sliding scale coverage, but postsurgery when the patient's appetite improved he was restarted back on his NPH and Humalog with meals, which he will resume upon discharge, and there was no evidence of any hypoglycemic episodes during the hospital course.   The patient is a FULL CODE. He is being discharged on the medications stated above with follow-up with Dr. Neoma Laming, Dr. Phoebe Perch, and Dr. Barbette Reichmann as an outpatient.   TIME SPENT ON DISCHARGE: 45 minutes.  ____________________________ Belia Heman. Verdell Carmine, MD vjs:slb D: 07/06/2011 13:57:23 ET T: 07/06/2011 15:57:50 ET JOB#: [id number]  cc: Belia Heman. Verdell Carmine, MD, <Dictator> Richard E. Burt Knack, MD Simonne Come. Inez Pilgrim, MD Dionisio David, MD Henreitta Leber MD ELECTRONICALLY SIGNED 07/06/2011 16:16

## 2014-05-30 NOTE — Op Note (Signed)
PATIENT NAME:  [name], [name] MR#:  [id number] DATE OF BIRTH:  [birth year]  DATE OF PROCEDURE:  07/02/2011  PREOPERATIVE DIAGNOSIS: Right colon polyps.   POSTOPERATIVE DIAGNOSIS: Right colon polyps.   PROCEDURE: Extended right hemicolectomy.   SURGEON: Phoebe Perch, M.D.   ANESTHESIA: General with endotracheal tube.   INDICATIONS: This is a patient with multiple polyps in the right colon, some of which are very suspicious for malignancy. Biopsies are currently pending. I have spoken to Dr. Candace Cruise concerning the location of these polyps, one of which has been tattooed. Preoperatively we discussed rationale for surgery, the options of observation, risks of bleeding, infection, anastomosis, anastomotic leak, ileostomy or colostomy temporary or permanent, and the risks of transfusion including HIV, viral hepatitis, and transfusion reactions. This was all reviewed for him and his family. They understood and agreed to proceed.   FINDINGS: Palpable polyp in the cecum which appeared sessile and was causing constriction of the serosa, suspicious for cancer. Larger, circumferential, almost apple-core type lesion of the ascending colon, approximately 8 or 10 cm distal to the cecal polyp, likely a cancer. Palpable tattooed lesion in the hepatic flexure. Smaller lesion noted by Dr. Candace Cruise to be removed was not palpable.   No palpable masses in the liver.  The liver appeared normal. Gallbladder appeared normal. No other abnormalities. There were some enlarged lymph nodes with [place] ink present from tattooing.   DESCRIPTION OF PROCEDURE: The patient was induced to general anesthesia. He was on IV perioperative Invanz antibiotic and VTE prophylaxis was in place. A Foley catheter was placed.   He was then prepped and draped in a sterile fashion and a transverse infraumbilical incision was utilized to open and explore the abdominal cavity. The lesions were identified and palpation of the liver failed to identify any  other abnormalities.  An extended right hemicolectomy was performed as follows. The lateral peritoneal reflection was taken down with electrocautery. The ureter was identified and protected. The mesoappendix was divided between clamps and ligated with 0 Vicryl. The terminal ileum was divided with a GIA and then further dissection lateral and cephalad along the hepatic flexure was performed towards the middle colic vessels. A site was chosen for division of the transverse colon, which was performed with a GIA stapler as well.  The mesentery of the right colon and terminal ileum was taken down and divided between clamps and ligated with 0 Vicryl ligatures, some of which were doubly ligated.   The specimen was passed off for examination in formalin.   The area was checked for hemostasis and found to be adequate. There were no other abnormalities. The gallbladder was easily visualized and appeared to be normal.   No further liver abnormalities were palpable either. Therefore, at this point, a side-to-side  handsewn anastomosis was performed by placing the terminal ileum at the tinea of the transverse colon. An outer layer of 3-0 silks was performed followed by an inner layer of running imbricating Lembert-type sutures of 3-0 Monocryl, and then another outer layer of 0 silks was performed. Palpation of the anastomosis demonstrated that it was patent. Closure of the mesenteric rent was performed with figure-of-eight 3-0 silks to avoid internal hernia. The nasogastric tube was confirmed to be in the stomach and in proper position.   At this point the sponge, lap, and needle counts were correct. There was no sign of bleeding lateral or medial to the anastomosis and no other palpable abnormalities. With the sponge, lap, and needle counts being correct,  Marcaine was infiltrated in the skin and subcutaneous tissues for a total of 30 mL and then the wound was closed with running #1 PDS followed by skin staples (the  area had been irrigated with copious amounts of normal saline prior to closure).   The patient tolerated this procedure well. There were no complications. He was taken to the recovery room in stable condition to be admitted for continued care.      Sponge, lap, and needle counts were correct. Estimated blood loss was 50 mL.     ____________________________ Jerrol Banana. Burt Knack, MD rec:bjt D: 07/02/2011 10:22:10 ET T: 07/02/2011 11:36:53 ET JOB#: [id number]  cc: Jerrol Banana. Burt Knack, MD, <Dictator> Simonne Come. Inez Pilgrim, MD Lupita Dawn. Candace Cruise, MD Florene Glen MD ELECTRONICALLY SIGNED 07/02/2011 14:36

## 2014-05-30 NOTE — Consult Note (Signed)
PATIENT NAME:  Jordan [name], Jordan [name] MR#:  [id number] DATE OF BIRTH:  Jun 14, 1950  DATE OF CONSULTATION:  06/28/2011  REFERRING PHYSICIAN:   CONSULTING PHYSICIAN:  Dionisio David, MD  HISTORY OF PRESENT ILLNESS: This is a 64 year old white male with a past medical history of hypertension and insulin requiring diabetes mellitus who is followed by Dr. Benita Stabile in [place]. He states that he was feeling fine until a few months back. He started developing shortness of breath on exertion. He would mow his lawn and he would get shortness of breath. He would have to sit down and then he would get better. He didn't think of this as anything and he just continued what he was doing. Two days ago he noticed that he ate something and afterward he noticed blood in the stools. He said there was red blood in his stools. He still did not seek medical advice. Today his blood pressure was 80 systolic. Normally it runs 110/70. He took it at home. He felt dizzy and he felt like he was going to pass out. He just felt very fatigued and had lack of energy so a family member decided to bring him to the hospital. In the hospital he was found to be having Hemoccult-positive stools and hemoglobin of 8.3. He also had EKG done which showed atrial fibrillation with rapid ventricular response with rate being 175.   REVIEW OF SYSTEMS: He was complaining of palpitation but no chest pain, some shortness of breath and dizziness.   PAST MEDICAL HISTORY:  1. History of insulin requiring diabetes. 2. Hypertension.   No history of hyperlipidemia.   SOCIAL HISTORY: No history of EtOH abuse or smoking.   FAMILY HISTORY: Positive for coronary artery disease in grandmother and grandfather had coronary disease and brother had coronary disease.   ALLERGIES: None.   MEDICATIONS:  1. Lisinopril 40 mg half-tablet a day.  2. Metoprolol 25 mg tablet half-tablet b.i.d.   PHYSICAL EXAMINATION:   VITAL SIGNS: His blood pressure right now  is 140/70, pulse 70, respirations 14. He is afebrile.   GENERAL: He is alert, oriented x3 in no acute distress.   NECK: No JVD.   LUNGS: Clear.   HEART: Regular rate and rhythm. Normal S1, S2. No audible murmur.   ABDOMEN: Soft, slight tenderness in the epigastrium. Positive bowel sounds. No guarding. No rebound.   EXTREMITIES: No pedal edema.   NEUROLOGIC: The patient appears to be intact.   LABORATORY, DIAGNOSTIC, AND RADIOLOGICAL DATA: White count 8.8, hemoglobin and hematocrit 8.3/24.5. Lipase 66. Glucose is 224. Creatinine is 1.36.   EKG shows atrial fibrillation, 175 beats per minute, poor R wave progression, nonspecific ST-T changes. Follow-up EKG after he converted to sinus rhythm within normal limits. Heart rate 75.   ASSESSMENT AND PLAN: The patient has new onset atrial fibrillation which has converted to sinus rhythm so it looks like paroxysmal atrial fibrillation. He converted with 20 mg of Cardizem. Agree with continuing metoprolol at higher dose of 25 b.i.d., continue lisinopril, and get a GI consult with Dr. Dionne Milo. He may need to be converted to amiodarone if it persists. Right now metoprolol is fine. Agree with getting an echocardiogram to evaluate wall motion and evaluate if there is any thrombi. He has contraindication for anticoagulants so we will not give aspirin or anticoagulant at this time even though he has [place] II score but since he is having GI bleed that's a contraindication.      Thank you very  much for the referral. He is low risk for procedure of colonoscopy or EGD.  ____________________________ Dionisio David, MD sak:drc D: 06/28/2011 16:41:13 ET T: 06/28/2011 17:12:16 ET JOB#: [id number]  cc: Dionisio David, MD, <Dictator> Dionisio David MD ELECTRONICALLY SIGNED 07/16/2011 15:46

## 2014-05-30 NOTE — Consult Note (Signed)
Pt had right colon surgery, covered by cardiology and int med, I will sign off.  Reconsult if GI can be of service.  Electronic Signatures: Manya Silvas (MD)  (Signed on 27-May-13 10:52)  Authored  Last Updated: 27-May-13 10:52 by Manya Silvas (MD)

## 2014-05-30 NOTE — Consult Note (Signed)
PATIENT NAME:  Jordan [name], Jordan [name] MR#:  [id number] DATE OF BIRTH:  1951/01/24  DATE OF CONSULTATION:  07/01/2011  REFERRING PHYSICIAN:  Abel Presto, MD  CONSULTING PHYSICIAN:  Jordan Come. Laina Guerrieri, MD  HISTORY OF PRESENT ILLNESS: Jordan [name] is a 64 year old patient who was admitted on May 23rd with bloody diarrhea without pain and some increased weakness and in the Emergency Room he was anemic with hemoglobin of 8.3 and new onset atrial fibrillation with rapid response. He was given IV Cardizem and converted. Later he went back in atrial fibrillation and was given amiodarone and then converted again to regular sinus rhythm. He was seen in follow-up and continues to be followed by Cardiology. He had some prerenal azotemia and has underlying history of diabetes and hypertension. His physical exam was otherwise unrevealing. He had normal liver function tests, normal low platelet count, and normal INR. He was admitted and managed with IV fluids and was transfused a unit of blood. His medications were adjusted. He was continued on metoprolol as well as the aforementioned Cardizem and amiodarone. He was seen by GI and by Cardiology. He had a CT scan with oral contrast only that showed stool versus mass in the ascending colon. After he was stabilized from a cardiac point of view, he underwent today a colonoscopy. He has a number of significant findings including a large nearly obstructing mass in the ascending colon and a second mass in the cecum, a cecal polyp and another polyp at the hepatic flexure. The hepatic flexure polyp could not be removed completely and the cecal polyp was not removed. The cecal and the ascending colon mass were biopsied. This had the appearance of colon cancer. Surgery was consulted. I discussed the case with Dr. Burt [name]. Dr. Burt [name] is recommending that regardless of any later findings of complete staging and/or even pending final results of pathology that the appearance of the tumor and  clinical status warrants removal now with risk of obstruction and bleeding.   PAST MEDICAL HISTORY:  1. Tonsillectomy. 2. Hypertension. 3. Diabetes. 4. Palpitations  ADMISSION MEDICATIONS: 1. Benazepril 20.  2. Metoprolol 60.7. 3. Folic acid daily.  4. Humulin 17 units in the morning and 10 at night with a sliding scale. 5. Multivitamins.   FAMILY HISTORY: Myocardial infarction but negative for malignancies.   SOCIAL HISTORY: No alcohol or tobacco.   ADDITIONAL REVIEW OF SYSTEMS: Fatigue and weakness as stated. He did not relate any fever or chills when I questioned him. No headache or dizziness. No visual disturbance. No ear or jaw pain. No cough or wheezing. No current palpitations or retrosternal chest pain. No abdominal pain. He has completed the colon prep and he had no other bleeding. The stools were discolored initially. He has had no edema. No easy bleeding or bruising. No rash. No focal weakness. No neurologic history of seizure or stroke. No history of anxiety or depression.   PHYSICAL EXAMINATION:   GENERAL: He is alert and cooperative in no acute distress. Slight pallor. No jaundice. No thrush.   LYMPH: No palpable lymph nodes in the neck, supraclavicular, submandibular, axilla.    LUNGS: Clear. No wheezing or rales.   ABDOMEN: Nontender. No palpable mass or organomegaly.   EXTREMITIES: No edema.   NEUROLOGIC: Grossly nonfocal.   LABORATORY, DIAGNOSTIC, AND RADIOLOGICAL DATA: He had atrial fibrillation on echocardiogram that was followed. He had a creatinine that was elevated on admission at 1.36. Later with fluids it is down to normal. He had normal liver function  tests and normal INR. Hemoglobin was 8.3. Platelets and white count were normal. He has subsequently been transfused. He currently has bradycardia and a borderline low blood pressure of 97/60, asymptomatic.   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 It looks like he has synchronous primaries. Other histology is always possible. On the noncontrast CT the liver was normal with no evidence of metastatic disease. He has not had the chest imaged. He certainly does not have any symptomatic metastatic disease. In this case as discussed with Dr. Burt [name], regardless of other findings the patient has already had bleeding requiring transfusion with a cardiac event and has a lesion that is described as nearly or pending obstruction, primary surgery is recommended rather than staging and potentially giving chemotherapy initially if the patient has metastatic disease. Will get a chest x-ray. Will check the CEA. Later will get a PET scan and after reviewing x-rays and final surgical pathology and stage will make a determination about possible adjuvant therapy which was discussed with and explained to the patient at this time.   ____________________________ Jordan Come Inez Pilgrim, MD rgg:drc D: 07/01/2011 18:30:39 ET T: 07/02/2011 08:42:43 ET JOB#: [id number]  cc: Jordan Come. Inez Pilgrim, MD, <Dictator> Jordan Schimke MD ELECTRONICALLY SIGNED 07/05/2011 10:51

## 2014-05-30 NOTE — Consult Note (Signed)
Chief Complaint:   Subjective/Chief Complaint No bowel movements. No further bleeding. HR remains very high. Hgb better at 8.9. CT with either fecal material or ? mass in ascending colon. Negative for any acute event. WBC count normal.  Impression: Diarrhea with fresh blood. ? Infectious. No signs of an acute ischemic event.  Hematochezia either secondary to an infectious process vs malignancy as suggested by CT. No signs of active GI bleed. A. fibb with rapid heart rate.  Recommendations: Follow H and H. Patient will eventually need a colonoscopy but only after his HR is better controlled and stable. Clear liquid diet. Dr. Candace Cruise will follow him over the weekend. Discussed with Dr. Verdell Carmine.   Electronic Signatures: Jill Side (MD)  (Signed [phone] 08:51)  Authored: Chief Complaint   Last Updated: 24-May-13 08:51 by Jill Side (MD)

## 2014-05-30 NOTE — Consult Note (Signed)
PATIENT NAME:  Jordan [name], Jordan [name] MR#:  [id number] DATE OF BIRTH:  26-Jun-1950  DATE OF CONSULTATION:  06/28/2011  REFERRING PHYSICIAN:  Dagoberto Ligas, MD CONSULTING PHYSICIAN:  Jill Side, MD  REASON FOR CONSULTATION: Bloody diarrhea.   HISTORY OF PRESENT ILLNESS: This is a 64 year old male with history of diabetes, and hypertension who came in with two days of bloody diarrhea. According to him, he started to have diarrhea about two days ago. He saw bright red blood mixed with the stool. The stools themselves were dark, although not completely black. He has been recently feeling weak and fatigued with loss of energy for the last couple of months. In the Emergency Room, his hemoglobin was only 8.3. The patient denies any prior history of gastrointestinal bleed. He has never had an esophagogastroduodenoscopy or colonoscopy. The patient was also found to be in atrial fibrillation with a heart rate up to 170. According to the patient, he has been on Metoprolol for about two years, although he does not follow with a cardiologist on a regular basis. The patient denies any abdominal pain. The patient's white cell count is normal, although his BUN and creatinine is somewhat elevated. He is afebrile and denies using non-steroidals.   PAST MEDICAL HISTORY:  1. Hypertension. 2. Diabetes. 3. History of palpitations and he has been on metoprolol for that.   ALLERGIES: None.   MEDICATIONS:  1. Metoprolol. 2. Benazepril.  3. Folic acid.  4. Humulin.  5. Multivitamin.   FAMILY HISTORY: Positive for coronary artery disease.   SOCIAL HISTORY: Does not smoke or drink.   REVIEW OF SYSTEMS: Grossly negative except for what is mentioned in the History of Present Illness.   PHYSICAL EXAMINATION:  GENERAL: Very well built male who does not appear to be in any acute distress. Fully awake, alert, and oriented.   HEENT: Unremarkable. Clinically, he does not appear to be severely anemic. No jaundice was  noted.   NECK: Neck veins are flat.   LUNGS: Grossly clear to auscultation bilaterally with fair air entry. No added sounds.   CARDIOVASCULAR: Regular rate and rhythm. No gallops or murmur.   ABDOMEN: Examination is quite benign. Bowel sounds are very sluggish.   ABDOMEN: Nontender, nondistended. There is no rebound or guarding.   NEUROLOGIC: Appears to be unremarkable.   LABORATORY, DIAGNOSTIC, AND RADIOLOGICAL DATA: White cell count is 8.8, hemoglobin is 8.3, hematocrit 24.5, MCV 86. Serum lipase is 66. Blood glucose 224. BUN 27, creatinine 1.36. Electrolytes are otherwise unremarkable. Liver enzymes are unremarkable. PT and INR within normal limits.   ASSESSMENT AND PLAN: The patient with bloody diarrhea for the last two days. His abdominal examination is very benign. He has no leukocytosis. Most likely, we are dealing with an acute infectious process, either viral or bacterial. The patient's hemoglobin and hematocrit is low although the patient hemodynamically seems to be fairly stable. The patient also presented with atrial fibrillation which may be secondary to gastrointestinal bleed and dehydration. The presence of atrial fibrillation with bloody diarrhea raises concerns about mesenteric ischemia or ischemic colitis, although the patient's physical examination and the rest of the lab work-up is not consistent with ischemia, although that remains a possibility. I would recommend IV hydration. Follow hemoglobin and hematocrit and transfuse if needed. I agree with cardiology consultation. I would recommend a CT scan of the abdomen and pelvis as well as serial abdominal examinations to see if the patient develops any abdominal signs which would increase the likelihood of an ischemic  event. We will follow and further recommendations to follow as well. Plan has been discussed with Dr. Dagoberto Ligas. The patient will eventually require a colonoscopy as well as an esophagogastroduodenoscopy, although  at this point because of the patient's unstable heart rate and suspected acute infectious, inflammatory or ischemic colitis, we will refrain from performing any further gastrointestinal work-up. We will follow.     ____________________________ Jill Side, MD si:ap D: 06/28/2011 18:33:23 ET T: 06/29/2011 10:38:32 ET JOB#: [id number]  cc: Jill Side, MD, <Dictator> Jill Side MD ELECTRONICALLY SIGNED 07/06/2011 18:35

## 2014-05-30 NOTE — Consult Note (Signed)
Had trouble with bowel prep. Only drank 3/4 of prep. Nevertheless, colonoscopy to cecum completed. Large, circumferential, nearly obstructing mass in ascending colon. Smaller cecal mass also seen. Both masses biopsied. Large polyp at hepatic flexure. Partially removed and tattooed. Medium polyp in prox TC removed. Recommend right hemicolectomy to remove both of the masses, which are likely to be cancerous, but also remove hepatic flexure polyp that will be quite difficult to remove endoscopically removed. Will discuss with family. Thanks  Electronic Signatures: Verdie Shire (MD)  (Signed on 26-May-13 08:00)  Authored  Last Updated: 26-May-13 08:00 by Verdie Shire (MD)

## 2014-05-30 NOTE — Consult Note (Signed)
Chief Complaint:   Subjective/Chief Complaint Covering for Dr.Iftikhar. Still having bloody diarrhea yest. HR finally in NSR with amiodarone drip.   VITAL SIGNS/ANCILLARY NOTES: **Vital Signs.:   25-May-13 08:28   Vital Signs Type Routine   Temperature Temperature (F) 97.1   Celsius 36.1   Temperature Source oral   Pulse Pulse 67   Pulse source per Dinamap   Respirations Respirations 20   Systolic BP Systolic BP 540   Diastolic BP (mmHg) Diastolic BP (mmHg) 65   Mean BP 83   BP Source Dinamap   Pulse Ox % Pulse Ox % 98   Pulse Ox Activity Level  At rest   Oxygen Delivery Room Air/ 21 %   Brief Assessment:   Cardiac Regular    Respiratory clear BS    Gastrointestinal Normal   Routine Hem:  25-May-13 07:04    WBC (CBC) 7.8   RBC (CBC) 2.94   Hemoglobin (CBC) 8.2   Hematocrit (CBC) 25.6   Platelet Count (CBC) 260   MCV 87   MCH 28.0   MCHC 32.1   RDW 13.9   Neutrophil % 74.8   Lymphocyte % 13.7   Monocyte % 9.0   Eosinophil % 2.1   Basophil % 0.4   Neutrophil # 5.8   Lymphocyte # 1.1   Monocyte # 0.7   Eosinophil # 0.2   Basophil # 0.0   Assessment/Plan:  Assessment/Plan:   Assessment Bloody diarrhea. Abn CT.    Plan Plan bowel prep today. If HR remains stable, perhaps proceed with colonoscopy tomorrow AM.   Electronic Signatures: Verdie Shire (MD)  (Signed [phone] 10:26)  Authored: Chief Complaint, VITAL SIGNS/ANCILLARY NOTES, Brief Assessment, Lab Results, Assessment/Plan   Last Updated: 25-May-13 10:26 by Verdie Shire (MD)

## 2014-05-30 NOTE — Consult Note (Signed)
Brief Consult Note: Diagnosis: rt colon masses.   Patient was seen by consultant.   Consult note dictated.   Recommend to proceed with surgery or procedure.   Orders entered.   Comments: will disc with Dr Candace Cruise. will need extended rt hemicolectomy. Procedure discussed. risks of bl inf anast leak ostomy transfusion rev'd. agrees to proceed.  Electronic Signatures: Florene Glen (MD)  (Signed 26-May-13 11:30)  Authored: Brief Consult Note   Last Updated: 26-May-13 11:30 by Florene Glen (MD)

## 2014-06-03 ENCOUNTER — Other Ambulatory Visit: Payer: Self-pay | Admitting: Nurse Practitioner

## 2014-06-06 NOTE — Consult Note (Signed)
Chief Complaint and History:  Referring Physician Dr. Thomasene Lot   Chief Complaint Rapid a.fib   History of Present Illness 64 y.o. male with h/o type 1 diabetes, amiodarone induced thyroiditis and persistent atrial fibrillation was seen in my clinic today and found to have tachycardia with a HR in the 140 - 160s. EKG revealed rapid a.fib.  He reports several days of heart palpitations. Reports compliance with his medications which include sotalol. Dose of sotalol was increased from 40 mg once daily in Dec 2015 to 40 mg bid by his cardiologist. He follows with Dr. Humphrey Rolls, Cardiology at [facility].   He was diagnosed with hyperthyroidism in 12/2013 when TSH level was <0.015 uIU/ml. He was sent for a thyroid uptake and scan at [facility] in 12/2013 which showed low uptake of 4% at 6 hrs and 5.5% at 24 hrs. On 12/09/13, he had an elevated thyrotropin receptor antibody of 6.96 IU/ml. Amiodarone was used to treat atrial fibrillation until 11/2013. Prior to that date, he had several months of weight loss (approx 15-20 lbs).  He reports good appetite. Denies neck pain. Denies dysphagia. Denies heat intolerance. No recent known exposure to iodinated contrast dye. No tremor. Last thyroid labs on 03/13/14 showed free T4 was 3.67 ng/dl, which was improved from 4.30 ng/dl in 01/2014.  Additional issue is type 1 diabetes. He is very reliable with insulin use and blood sugar monitoring. Sugars are variable and he has both hypo- and hyperglycemia. Last A1c was 6.5% on 03/12/14. Has a history of hypoglycemia unawareness   Past History PAST MEDICAL HISTORY 1. Type 1 diabetes. A. DKA 06/2012 with hospitalization at [facility] B. diabetes refresher class 09/2012 2. Hypertension.  3. History of atrial fibrillation.  4. 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 5. Colon cancer, stage II, status post right-sided hemicolectomy May 2013.  6. Hyperthyroidism, onset 11/2013  MEDICATIONS benazepril (LOTENSIN) 20 MG tablet  -once daily.     dabigatran (PRADAXA) 150 mg capsule - 2 (two) times daily.    folic acid (FOLVITE) 1 MG tablet  Take 1 mg by mouth once daily.    NOVOLOG FLEXPEN - Take 6 units before breakfast, 4 units before lunch, and 3 units before supper NOVOLOG FLEXPEN - Take 1-5 units for high sugars (>150) before meals and at bedtime as needed.  LANTUS- Inject 12 Units subcutaneously every morning.    MULTIVITAMIN- Take 1 tablet by mouth once daily.    Sotalol (SOTALOL AF) 80 MG tablet - Take 0.5 tablets (40 mg total) by mouth twice daily.     SOCIAL HISTORY The patient lives alone. Two sisters Joaquim Lai and [place]) leave nearby. No alcohol or tobacco use. Prior history of tobacco use, quit in 1982.   FAMILY HISTORY Positive for diabetes and heart disease.   Allergies:  Other -Explain in [place]: Other  IVP Dye: Unknown  Review of Systems:  Review of Systems   GENERAL: Weight inc'd 9 lbs in last 2 months. No fever. He has fatigue.  NEUROLOGIC: Patient denies headaches.  Patient denies blurred vision. NECK: Patient denies neck swelling, neck pain or difficulty with swallowing. CARDIAC: Patient denies chest pain. He has palpitations. RESPIRATORY: Patient denies SOB. Patient denies a cough.  GASTROINTESTINAL: Patient denies abdominal pain, nausea and vomiting and constipation. Patient denies blood in the stool. GENITOURINARY: Patient denies dysuria and hematuria.  EXTREMITIES: Patient denies leg swelling. HEME: Patient denies easy bruisability. SKIN: Patient denies any rash.   Physical Exam:  General Thin WM in  NAD   Skin No rash   Eyes EOMI, no proptosis.   ENMT OP clear, MMM   Head and Neck Neck supple   Respiratory and Thorax Clear b/l, no wheeze   Cardiovascular Irreg irreg, tachy   Gastrointestinal Soft, NT   Musculoskeletal +tremor of outstretched hands   Neurological 2+ knee DTR, equal and symmetric   Psychiatric Calm, cooperative. A&O x3   VITAL  SIGNS/ANCILLARY NOTES: ED Vital Sign Flow Sheet:   12-Feb-16 16:52  Pulse Pulse 164  Respirations Respirations 20  SBP SBP 123  DBP DBP 91  Pulse Ox % Pulse Ox % 96  Pulse Ox Source Source Room Air  Pain Scale (0-10) Pain Scale (0-10) Scale:0   Laboratory Results: Hepatic:  12-Feb-16 15:42   Bilirubin, Total 0.4  Alkaline Phosphatase  135  SGPT (ALT) 38  SGOT (AST)  38  Total Protein, Serum 7.2  Albumin, Serum  3.2  Routine Chem:  12-Feb-16 15:42   Glucose, Serum 88  BUN  21  Creatinine (comp) 0.80  Sodium, Serum 139  Potassium, Serum 4.1  Chloride, Serum  112  CO2, Serum 27  Calcium (Total), Serum 9.1  Osmolality (calc) 280  eGFR (African American) >60  eGFR (Non-African American) >60 (eGFR values <4mL/min/1.73 m2 may be an indication of chronic kidney disease (CKD). Calculated eGFR, using the MRDR Study equation, is useful in  patients with stable renal function. The eGFR calculation will not be reliable in acutely ill patients when serum creatinine is changing rapidly. It is not useful in patients on dialysis. The eGFR calculation may not be applicable to patients at the low and high extremes of body sizes, pregnant women, and vegetarians.)  Anion Gap  0  Cardiac:  12-Feb-16 15:42   CK, Total 80  CPK-MB, Serum 3.2 (Result(s) reported on 19 Mar 2014 at 04:18PM.)  Troponin I < 0.02 (0.00-0.05 0.05 ng/mL or less: NEGATIVE  Repeat testing in 3-6 hrs  if clinically indicated. >0.05 ng/mL: POTENTIAL  MYOCARDIAL INJURY. Repeat  testing in 3-6 hrs if  clinically indicated. NOTE: An increase or decrease  of 30% or more on serial  testing suggests a  clinically important change)  Routine Hem:  12-Feb-16 15:42   WBC (CBC) 6.5  RBC (CBC)  4.28  Hemoglobin (CBC)  12.9  Hematocrit (CBC)  38.5  Platelet Count (CBC) 257 (Result(s) reported on 19 Mar 2014 at 04:02PM.)  MCV 90  MCH 30.1  MCHC 33.4  RDW 13.5   Assessment/Plan:  Assessment/Plan 1. Rapid a.fib  -- appreciate assistance of Dr. Thomasene Lot in managing a.fib. Cardiology has been consulted. May need echocardiogram.   2. 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 His +TrAb suggests type 1 AIT (Grave's type) however low uptake suggests type 2 AIT (destructive type). Free T4 has slowly improved since diagnosis of hyperthyroidism and since time of stopping amiodarone. However, as this may be contributing to his a.fib, I recommend we start treatment with high dose steroids and follow free T4 closely. Will start prednisone 40 mg daily. Thionamides likely not to be effective in setting of fairly low uptake on thyroid uptake/scan.   3. Type 1 diabetes - Recomend he be starting on IV insulin with monitoring of BG per protocol and tirtration of IV insulin accordingly. This should be continued while he is on the IV CCB for control of a.fib. Ensure he is transitioned to  SQ insulin before stopping the IV insulin. Additionally, he likely will need a higher dose of SQ insulin than previously taking, in setting of high dose steroids.  I will not be available to see Mr Paschal again until 2/23. I will be sure to see him within a few days of hospital discharge. Please be sure to alert my clinic before hospital discharge (pho [phone]). Call if questions (pgr [phone]).   Electronic Signatures: Judi Cong (MD)  (Signed 12-Feb-16 17:50)  Authored: Chief Complaint and History, ALLERGIES, Review of Systems, Physcial Exam, Vital Signs, LABS, Assessment/Plan   Last Updated: 12-Feb-16 17:50 by Judi Cong (MD)

## 2017-07-24 ENCOUNTER — Ambulatory Visit
Admission: RE | Admit: 2017-07-24 | Discharge: 2017-07-24 | Disposition: A | Discharge status: Home / Self Care | Payer: Medicare Other | Source: Ambulatory Visit | Attending: Internal Medicine | Admitting: Internal Medicine

## 2017-07-24 ENCOUNTER — Ambulatory Visit: Payer: Medicare Other | Admitting: Anesthesiology

## 2017-07-24 ENCOUNTER — Encounter: Payer: Self-pay | Admitting: *Deleted

## 2017-07-24 ENCOUNTER — Encounter
Admission: RE | Disposition: A | Discharge status: Home / Self Care | Payer: Self-pay | Source: Ambulatory Visit | Attending: Internal Medicine

## 2017-07-24 ENCOUNTER — Other Ambulatory Visit: Payer: Self-pay

## 2017-07-24 DIAGNOSIS — D128 Benign neoplasm of rectum: Secondary | ICD-10-CM | POA: Insufficient documentation

## 2017-07-24 DIAGNOSIS — E0590 Thyrotoxicosis, unspecified without thyrotoxic crisis or storm: Secondary | ICD-10-CM | POA: Insufficient documentation

## 2017-07-24 DIAGNOSIS — Z79899 Other long term (current) drug therapy: Secondary | ICD-10-CM | POA: Insufficient documentation

## 2017-07-24 DIAGNOSIS — Z7901 Long term (current) use of anticoagulants: Secondary | ICD-10-CM | POA: Diagnosis not present

## 2017-07-24 DIAGNOSIS — Z8673 Personal history of transient ischemic attack (TIA), and cerebral infarction without residual deficits: Secondary | ICD-10-CM | POA: Diagnosis not present

## 2017-07-24 DIAGNOSIS — I4891 Unspecified atrial fibrillation: Secondary | ICD-10-CM | POA: Diagnosis not present

## 2017-07-24 DIAGNOSIS — Z85038 Personal history of other malignant neoplasm of large intestine: Secondary | ICD-10-CM | POA: Insufficient documentation

## 2017-07-24 DIAGNOSIS — K640 First degree hemorrhoids: Secondary | ICD-10-CM | POA: Insufficient documentation

## 2017-07-24 DIAGNOSIS — Z794 Long term (current) use of insulin: Secondary | ICD-10-CM | POA: Diagnosis not present

## 2017-07-24 DIAGNOSIS — Z9049 Acquired absence of other specified parts of digestive tract: Secondary | ICD-10-CM | POA: Diagnosis not present

## 2017-07-24 DIAGNOSIS — E109 Type 1 diabetes mellitus without complications: Secondary | ICD-10-CM | POA: Insufficient documentation

## 2017-07-24 DIAGNOSIS — D123 Benign neoplasm of transverse colon: Secondary | ICD-10-CM | POA: Diagnosis not present

## 2017-07-24 DIAGNOSIS — D127 Benign neoplasm of rectosigmoid junction: Secondary | ICD-10-CM | POA: Diagnosis not present

## 2017-07-24 DIAGNOSIS — Z980 Intestinal bypass and anastomosis status: Secondary | ICD-10-CM | POA: Insufficient documentation

## 2017-07-24 DIAGNOSIS — Z888 Allergy status to other drugs, medicaments and biological substances status: Secondary | ICD-10-CM | POA: Insufficient documentation

## 2017-07-24 DIAGNOSIS — I10 Essential (primary) hypertension: Secondary | ICD-10-CM | POA: Diagnosis not present

## 2017-07-24 HISTORY — DX: Essential (primary) hypertension: I10

## 2017-07-24 HISTORY — DX: Polyp of colon: K63.5

## 2017-07-24 HISTORY — PX: COLONOSCOPY WITH PROPOFOL: SHX5780

## 2017-07-24 LAB — GLUCOSE, CAPILLARY: GLUCOSE-CAPILLARY: 320 mg/dL — AB (ref 65–99)

## 2017-07-24 SURGERY — COLONOSCOPY WITH PROPOFOL
Anesthesia: General

## 2017-07-24 MED ORDER — SODIUM CHLORIDE 0.9 % IV SOLN
INTRAVENOUS | Status: DC
  Administered 2017-07-24 (×2): via INTRAVENOUS

## 2017-07-24 MED ORDER — FENTANYL CITRATE (PF) 100 MCG/2ML IJ SOLN
INTRAMUSCULAR | Status: AC
  Filled 2017-07-24: qty 2

## 2017-07-24 MED ORDER — PROPOFOL 10 MG/ML IV BOLUS
INTRAVENOUS | Status: DC | PRN
  Administered 2017-07-24: 100 mg via INTRAVENOUS

## 2017-07-24 MED ORDER — PROPOFOL 10 MG/ML IV BOLUS
INTRAVENOUS | Status: AC
  Filled 2017-07-24: qty 20

## 2017-07-24 MED ORDER — PROPOFOL 500 MG/50ML IV EMUL
INTRAVENOUS | Status: AC
  Filled 2017-07-24: qty 50

## 2017-07-24 MED ORDER — PROPOFOL 500 MG/50ML IV EMUL
INTRAVENOUS | Status: DC | PRN
  Administered 2017-07-24: 150 ug/kg/min via INTRAVENOUS

## 2017-07-24 MED ORDER — FENTANYL CITRATE (PF) 100 MCG/2ML IJ SOLN
INTRAMUSCULAR | Status: DC | PRN
  Administered 2017-07-24 (×2): 50 ug via INTRAVENOUS

## 2017-07-24 MED ORDER — LIDOCAINE HCL (PF) 2 % IJ SOLN
INTRAMUSCULAR | Status: AC
  Filled 2017-07-24: qty 10

## 2017-07-24 MED ORDER — LIDOCAINE 2% (20 MG/ML) 5 ML SYRINGE
INTRAMUSCULAR | Status: DC | PRN
  Administered 2017-07-24: 30 mg via INTRAVENOUS

## 2017-07-24 NOTE — H&P (Signed)
  Outpatient short stay form Pre-procedure 07/24/2017 8:41 AM Teodoro K. Alice Reichert, M.D.  Primary Physician: Benita Stabile, M.D.  Reason for visit:  Personal hx of colon cancer s/p right hemicolectomy  History of present illness:  67 y/o male with personal hx of colon cancer s/p right hemicolectomy in 2013 presents for surveillance. Denies abd pain, change in bowel habits, or rectal bleeding.     Current Facility-Administered Medications:  .  0.9 %  sodium chloride infusion, , Intravenous, Continuous, [place], Benay Pike, MD, Last Rate: 20 mL/hr at 07/24/17 5397  Medications Prior to Admission  Medication Sig Dispense Refill Last Dose  . benazepril (LOTENSIN) 20 MG tablet Take 20 mg by mouth daily.   Past Week  . folic acid (FOLVITE) 1 MG tablet Take 1 mg by mouth daily.   Past Week at Unknown time  . insulin aspart (NOVOLOG) 100 UNIT/ML injection Inject 3-6 Units into the skin See admin instructions. 6units with breakfast, 4U lunch, 3U supper   Past Week  . insulin glargine (LANTUS) 100 UNIT/ML injection 12 units in the AM, 10 units at bedtime. (Patient taking differently: 28 Units daily. ) 10 mL 11 07/24/2017 at Unknown time  . Multiple Vitamin (MULTIVITAMIN) capsule Take 1 capsule by mouth daily.   Past Week at Unknown time  . propranolol (INDERAL) 80 MG tablet Take 1 tablet (80 mg total) by mouth 3 (three) times daily. 90 tablet 6 Past Week  . propylthiouracil (PTU) 50 MG tablet Take 3 tablets (150 mg total) by mouth every 6 (six) hours. 360 tablet 1 Past Week  . rivaroxaban (XARELTO) 20 MG TABS tablet Take 20 mg by mouth daily with supper.   Past Week at Unknown time  . rosuvastatin (CRESTOR) 5 MG tablet Take 5 mg by mouth daily.   Past Week  . sotalol (BETAPACE) 80 MG tablet Take 40 mg by mouth 2 (two) times daily.   Past Week at Unknown time  . dabigatran (PRADAXA) 150 MG CAPS capsule Take 150 mg by mouth 2 (two) times daily.   Not Taking at Unknown time     Allergies  Allergen Reactions   . Amiodarone      Past Medical History:  Diagnosis Date  . Atrial fibrillation ([place])   . Cancer ([place])    a. 06/2011 b. s/p right hemicolectomy/ colon CA  . Colon polyps   . Dysrhythmia   . Hypertension   . Hyperthyroidism    a. 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 Stroke ([place]) 03/2012   a. residual right sided weakness  . Type 1 diabetes ([place])    a. on insulin b. prior DKA     Review of systems:  Otherwise negative.    Physical Exam  Gen: Alert, oriented. Appears stated age.  HEENT: [place]/AT. PERRLA. Lungs: CTA, no wheezes. CV: RR nl S1, S2. Abd: soft, benign, no masses. BS+ Ext: No edema. Pulses 2+    Planned procedures: Proceed with colonoscopy. The patient understands the nature of the planned procedure, indications, risks, alternatives and potential complications including but not limited to bleeding, infection, perforation, damage to internal organs and possible oversedation/side effects from anesthesia. The patient agrees and gives consent to proceed.  Please refer to procedure notes for findings, recommendations and patient disposition/instructions.     Teodoro K. Alice Reichert, M.D. Gastroenterology 07/24/2017  8:41 AM

## 2017-07-24 NOTE — Anesthesia Post-op Follow-up Note (Signed)
Anesthesia QCDR form completed.        

## 2017-07-24 NOTE — Anesthesia Preprocedure Evaluation (Signed)
Anesthesia Evaluation  Patient identified by MRN, date of birth, ID band Patient awake    Reviewed: Allergy & Precautions, H&P , NPO status , Patient's Chart, lab work & pertinent test results, reviewed documented beta blocker date and time   History of Anesthesia Complications Negative for: history of anesthetic complications  Airway Mallampati: II  TM Distance: >3 FB Neck ROM: full    Dental  (+) Dental Advidsory Given   Pulmonary neg pulmonary ROS, former smoker,           Cardiovascular Exercise Tolerance: Good hypertension, (-) angina(-) CAD, (-) Past MI, (-) Cardiac Stents and (-) CABG + dysrhythmias Atrial Fibrillation (-) Valvular Problems/Murmurs     Neuro/Psych neg Seizures CVA, Residual Symptoms negative psych ROS   GI/Hepatic negative GI ROS, Neg liver ROS,   Endo/Other  diabetesHyperthyroidism   Renal/GU negative Renal ROS  negative genitourinary   Musculoskeletal   Abdominal   Peds  Hematology negative hematology ROS (+)   Anesthesia Other Findings Past Medical History: No date: Atrial fibrillation (HCC) No date: Cancer [facility])     Comment:  a. 06/2011 b. s/p right hemicolectomy/ colon CA No date: Colon polyps No date: Dysrhythmia No date: Hypertension No date: Hyperthyroidism     Comment:  a. mixed type 1 and 2 AIT, +antibodies and low uptake on              scan 03/2012: Stroke [facility])     Comment:  a. residual right sided weakness No date: Type 1 diabetes (HCC)     Comment:  a. on insulin b. prior DKA    Reproductive/Obstetrics negative OB ROS                             Anesthesia Physical Anesthesia Plan  ASA: III  Anesthesia Plan: General   Post-op Pain Management:    Induction: Intravenous  PONV Risk Score and Plan: 2 and Propofol infusion  Airway Management Planned: Nasal Cannula  Additional Equipment:   Intra-op Plan:   Post-operative Plan:    Informed Consent: I have reviewed the patients History and Physical, chart, labs and discussed the procedure including the risks, benefits and alternatives for the proposed anesthesia with the patient or authorized representative who has indicated his/her understanding and acceptance.   Dental Advisory Given  Plan Discussed with: Anesthesiologist, CRNA and Surgeon  Anesthesia Plan Comments:         Anesthesia Quick Evaluation

## 2017-07-24 NOTE — Op Note (Signed)
[facility] Gastroenterology Patient Name: Jordan [name] Procedure Date: 07/24/2017 8:38 AM MRN: [id number] Account #: [id number] Date of Birth: 1951-01-04 Admit Type: Outpatient Age: 67 Room: [facility] ENDO ROOM 4 Gender: Male Note Status: Finalized Procedure:            Colonoscopy Indications:          High risk colon cancer surveillance: Personal history                        of colon cancer Providers:            Benay Pike. Alice Reichert MD, MD Referring MD:         Leona Carry. Hall Busing, MD (Referring MD) Medicines:            Propofol per Anesthesia Complications:        No immediate complications. Procedure:            Pre-Anesthesia Assessment:                       - The risks and benefits of the procedure and the                        sedation options and risks were discussed with the                        patient. All questions were answered and informed                        consent was obtained.                       - Patient identification and proposed procedure were                        verified prior to the procedure by the nurse. The                        procedure was verified in the procedure room.                       - ASA Grade Assessment: III - A patient with severe                        systemic disease.                       - After reviewing the risks and benefits, the patient                        was deemed in satisfactory condition to undergo the                        procedure.                       After obtaining informed consent, the colonoscope was                        passed under direct vision. Throughout the procedure,  the patient's blood pressure, pulse, and oxygen                        saturations were monitored continuously. The                        Colonoscope was introduced through the anus and                        advanced to the the ileocolonic anastomosis. The                        colonoscopy  was performed without difficulty. The                        patient tolerated the procedure well. The quality of                        the bowel preparation was good. Findings:      The perianal and digital rectal examinations were normal. Pertinent       negatives include normal sphincter tone and no palpable rectal lesions.      There was evidence of a prior end-to-side ileo-colonic anastomosis in       the ascending colon and in the cecum. This was patent and was       characterized by healthy appearing mucosa. The anastomosis was traversed.      Three semi-pedunculated polyps were found in the rectum, recto-sigmoid       colon and transverse colon. The polyps were 3 to 5 mm in size. These       polyps were removed with a jumbo cold forceps. Resection and retrieval       were complete.      Non-bleeding internal hemorrhoids were found during retroflexion. The       hemorrhoids were Grade I (internal hemorrhoids that do not prolapse).      The exam was otherwise normal throughout the examined colon. Impression:           - Patent end-to-side ileo-colonic anastomosis,                        characterized by healthy appearing mucosa.                       - Three 3 to 5 mm polyps in the rectum, at the                        recto-sigmoid colon and in the transverse colon,                        removed with a jumbo cold forceps. Resected and                        retrieved.                       - Non-bleeding internal hemorrhoids. Recommendation:       - Patient has a contact number available for                        emergencies. The signs and symptoms of potential  delayed complications were discussed with the patient.                        Return to normal activities tomorrow. Written discharge                        instructions were provided to the patient.                       - Resume previous diet.                       - Continue present  medications.                       - Await pathology results.                       - Repeat colonoscopy for surveillance based on                        pathology results.                       - Return to GI office PRN. Procedure Code(s):    --- Professional ---                       [phone], Colonoscopy, flexible; with biopsy, single or                        multiple Diagnosis Code(s):    --- Professional ---                       K64.0, First degree hemorrhoids                       D12.3, Benign neoplasm of transverse colon (hepatic                        flexure or splenic flexure)                       D12.7, Benign neoplasm of rectosigmoid junction                       K62.1, Rectal polyp                       Z98.0, Intestinal bypass and anastomosis status                       Z85.038, Personal history of other malignant neoplasm                        of large intestine CPT copyright 2017 American Medical Association. All rights reserved. The codes documented in this report are preliminary and upon coder review may  be revised to meet current compliance requirements. Efrain Sella MD, MD 07/24/2017 9:12:29 AM This report has been signed electronically. Number of Addenda: 0 Note Initiated On: 07/24/2017 8:38 AM Scope Withdrawal Time: 0 hours 4 minutes 59 seconds  Total Procedure Duration: 0 hours 10 minutes 16 seconds       [facility]

## 2017-07-24 NOTE — Transfer of Care (Signed)
Immediate Anesthesia Transfer of Care Note  Patient: Jordan [name] Reasons [place]inley  Procedure(s) Performed: COLONOSCOPY WITH PROPOFOL (N/A )  Patient Location: PACU and Endoscopy Unit  Anesthesia Type:General  Level of Consciousness: drowsy  Airway & Oxygen Therapy: Patient Spontanous Breathing and Patient connected to nasal cannula oxygen  Post-op Assessment: Report given to RN and Post -op Vital signs reviewed and stable  Post vital signs: Reviewed and stable  Last Vitals:  Vitals Value Taken Time  BP    Temp    Pulse    Resp    SpO2      Last Pain:  Vitals:   07/24/17 0806  TempSrc: Tympanic  PainSc: 0-No pain         Complications: No apparent anesthesia complications

## 2017-07-24 NOTE — Anesthesia Postprocedure Evaluation (Signed)
Anesthesia Post Note  Patient: Jordan [name]  Procedure(s) Performed: COLONOSCOPY WITH PROPOFOL (N/A )  Patient location during evaluation: Endoscopy Anesthesia Type: General Level of consciousness: awake and alert Pain management: pain level controlled Vital Signs Assessment: post-procedure vital signs reviewed and stable Respiratory status: spontaneous breathing, nonlabored ventilation, respiratory function stable and patient connected to nasal cannula oxygen Cardiovascular status: blood pressure returned to baseline and stable Postop Assessment: no apparent nausea or vomiting Anesthetic complications: no     Last Vitals:  Vitals:   07/24/17 0920 07/24/17 0930  BP: 121/74 (!) 152/78  Pulse: 81 71  Resp: (!) 26 12  Temp:    SpO2: 96% 100%    Last Pain:  Vitals:   07/24/17 0910  TempSrc: Tympanic  PainSc:                  [name]

## 2017-07-25 ENCOUNTER — Encounter: Payer: Self-pay | Admitting: Internal Medicine

## 2017-07-25 LAB — SURGICAL PATHOLOGY

## 2017-09-21 ENCOUNTER — Emergency Department: Payer: Medicare Other

## 2017-09-21 ENCOUNTER — Encounter: Payer: Self-pay | Admitting: *Deleted

## 2017-09-21 ENCOUNTER — Other Ambulatory Visit: Payer: Self-pay

## 2017-09-21 ENCOUNTER — Inpatient Hospital Stay: Payer: Medicare Other

## 2017-09-21 ENCOUNTER — Inpatient Hospital Stay
Admission: EM | Admit: 2017-09-21 | Discharge: 2017-10-01 | DRG: 871 | Disposition: A | Discharge status: Skilled Nursing Facility | Payer: Medicare Other | Attending: Internal Medicine | Admitting: Internal Medicine

## 2017-09-21 DIAGNOSIS — I480 Paroxysmal atrial fibrillation: Secondary | ICD-10-CM | POA: Diagnosis not present

## 2017-09-21 DIAGNOSIS — I499 Cardiac arrhythmia, unspecified: Secondary | ICD-10-CM

## 2017-09-21 DIAGNOSIS — E1065 Type 1 diabetes mellitus with hyperglycemia: Secondary | ICD-10-CM | POA: Diagnosis not present

## 2017-09-21 DIAGNOSIS — E860 Dehydration: Secondary | ICD-10-CM | POA: Diagnosis present

## 2017-09-21 DIAGNOSIS — Z66 Do not resuscitate: Secondary | ICD-10-CM | POA: Diagnosis not present

## 2017-09-21 DIAGNOSIS — Z79899 Other long term (current) drug therapy: Secondary | ICD-10-CM

## 2017-09-21 DIAGNOSIS — I495 Sick sinus syndrome: Secondary | ICD-10-CM | POA: Diagnosis not present

## 2017-09-21 DIAGNOSIS — I248 Other forms of acute ischemic heart disease: Secondary | ICD-10-CM | POA: Diagnosis not present

## 2017-09-21 DIAGNOSIS — E876 Hypokalemia: Secondary | ICD-10-CM | POA: Diagnosis not present

## 2017-09-21 DIAGNOSIS — N136 Pyonephrosis: Secondary | ICD-10-CM | POA: Diagnosis not present

## 2017-09-21 DIAGNOSIS — N179 Acute kidney failure, unspecified: Secondary | ICD-10-CM | POA: Diagnosis present

## 2017-09-21 DIAGNOSIS — I10 Essential (primary) hypertension: Secondary | ICD-10-CM | POA: Diagnosis present

## 2017-09-21 DIAGNOSIS — I69320 Aphasia following cerebral infarction: Secondary | ICD-10-CM | POA: Diagnosis not present

## 2017-09-21 DIAGNOSIS — E875 Hyperkalemia: Secondary | ICD-10-CM | POA: Diagnosis present

## 2017-09-21 DIAGNOSIS — I69398 Other sequelae of cerebral infarction: Secondary | ICD-10-CM

## 2017-09-21 DIAGNOSIS — Z87891 Personal history of nicotine dependence: Secondary | ICD-10-CM | POA: Diagnosis not present

## 2017-09-21 DIAGNOSIS — T462X5A Adverse effect of other antidysrhythmic drugs, initial encounter: Secondary | ICD-10-CM | POA: Diagnosis not present

## 2017-09-21 DIAGNOSIS — E871 Hypo-osmolality and hyponatremia: Secondary | ICD-10-CM | POA: Diagnosis not present

## 2017-09-21 DIAGNOSIS — N329 Bladder disorder, unspecified: Secondary | ICD-10-CM | POA: Diagnosis present

## 2017-09-21 DIAGNOSIS — E0590 Thyrotoxicosis, unspecified without thyrotoxic crisis or storm: Secondary | ICD-10-CM | POA: Diagnosis present

## 2017-09-21 DIAGNOSIS — R531 Weakness: Secondary | ICD-10-CM | POA: Diagnosis present

## 2017-09-21 DIAGNOSIS — Z8639 Personal history of other endocrine, nutritional and metabolic disease: Secondary | ICD-10-CM | POA: Diagnosis not present

## 2017-09-21 DIAGNOSIS — R0600 Dyspnea, unspecified: Secondary | ICD-10-CM

## 2017-09-21 DIAGNOSIS — E870 Hyperosmolality and hypernatremia: Secondary | ICD-10-CM | POA: Diagnosis present

## 2017-09-21 DIAGNOSIS — R6521 Severe sepsis with septic shock: Secondary | ICD-10-CM | POA: Diagnosis present

## 2017-09-21 DIAGNOSIS — I69351 Hemiplegia and hemiparesis following cerebral infarction affecting right dominant side: Secondary | ICD-10-CM | POA: Diagnosis not present

## 2017-09-21 DIAGNOSIS — R296 Repeated falls: Secondary | ICD-10-CM | POA: Diagnosis present

## 2017-09-21 DIAGNOSIS — A419 Sepsis, unspecified organism: Secondary | ICD-10-CM | POA: Diagnosis present

## 2017-09-21 DIAGNOSIS — E1051 Type 1 diabetes mellitus with diabetic peripheral angiopathy without gangrene: Secondary | ICD-10-CM | POA: Diagnosis present

## 2017-09-21 DIAGNOSIS — N1330 Unspecified hydronephrosis: Secondary | ICD-10-CM | POA: Diagnosis not present

## 2017-09-21 DIAGNOSIS — Z9181 History of falling: Secondary | ICD-10-CM

## 2017-09-21 DIAGNOSIS — Z85038 Personal history of other malignant neoplasm of large intestine: Secondary | ICD-10-CM

## 2017-09-21 DIAGNOSIS — E785 Hyperlipidemia, unspecified: Secondary | ICD-10-CM | POA: Diagnosis present

## 2017-09-21 DIAGNOSIS — R1900 Intra-abdominal and pelvic swelling, mass and lump, unspecified site: Secondary | ICD-10-CM | POA: Diagnosis present

## 2017-09-21 DIAGNOSIS — Z888 Allergy status to other drugs, medicaments and biological substances status: Secondary | ICD-10-CM

## 2017-09-21 DIAGNOSIS — N319 Neuromuscular dysfunction of bladder, unspecified: Secondary | ICD-10-CM | POA: Diagnosis present

## 2017-09-21 DIAGNOSIS — R4182 Altered mental status, unspecified: Secondary | ICD-10-CM

## 2017-09-21 DIAGNOSIS — D649 Anemia, unspecified: Secondary | ICD-10-CM | POA: Diagnosis not present

## 2017-09-21 DIAGNOSIS — E1010 Type 1 diabetes mellitus with ketoacidosis without coma: Secondary | ICD-10-CM | POA: Diagnosis present

## 2017-09-21 DIAGNOSIS — J9600 Acute respiratory failure, unspecified whether with hypoxia or hypercapnia: Secondary | ICD-10-CM

## 2017-09-21 DIAGNOSIS — G9349 Other encephalopathy: Secondary | ICD-10-CM | POA: Diagnosis present

## 2017-09-21 DIAGNOSIS — Z7901 Long term (current) use of anticoagulants: Secondary | ICD-10-CM

## 2017-09-21 LAB — URINALYSIS, COMPLETE (UACMP) WITH MICROSCOPIC
BILIRUBIN URINE: NEGATIVE
Glucose, UA: >=500 mg/dL — AB
KETONES UR: 20 mg/dL — AB
Nitrite: NEGATIVE
PH: 5 (ref 5.0–8.0)
PROTEIN: NEGATIVE mg/dL
Specific Gravity, Urine: 1.008 (ref 1.005–1.030)
Squamous Epithelial / LPF: NONE SEEN (ref 0–5)

## 2017-09-21 LAB — CBC WITH DIFFERENTIAL/PLATELET
BASOS ABS: 0 10*3/uL (ref 0–0.1)
BASOS PCT: 0 %
EOS ABS: 0 10*3/uL (ref 0–0.7)
EOS PCT: 0 %
HCT: 30.8 % — ABNORMAL LOW (ref 40.0–52.0)
Hemoglobin: 10.2 g/dL — ABNORMAL LOW (ref 13.0–18.0)
LYMPHS PCT: 2 %
Lymphs Abs: 0.7 10*3/uL — ABNORMAL LOW (ref 1.0–3.6)
MCH: 29.7 pg (ref 26.0–34.0)
MCHC: 33.1 g/dL (ref 32.0–36.0)
MCV: 89.8 fL (ref 80.0–100.0)
MONO ABS: 1.2 10*3/uL — AB (ref 0.2–1.0)
Monocytes Relative: 4 %
Neutro Abs: 30.4 10*3/uL — ABNORMAL HIGH (ref 1.4–6.5)
Neutrophils Relative %: 94 %
PLATELETS: 538 10*3/uL — AB (ref 150–440)
RBC: 3.43 MIL/uL — AB (ref 4.40–5.90)
RDW: 13 % (ref 11.5–14.5)
WBC: 32.3 10*3/uL — AB (ref 3.8–10.6)

## 2017-09-21 LAB — COMPREHENSIVE METABOLIC PANEL
ALBUMIN: 3.2 g/dL — AB (ref 3.5–5.0)
ALT: 18 U/L (ref 0–44)
AST: 16 U/L (ref 15–41)
Alkaline Phosphatase: 92 U/L (ref 38–126)
BUN: 121 mg/dL — AB (ref 8–23)
CHLORIDE: 102 mmol/L (ref 98–111)
Calcium: 8.9 mg/dL (ref 8.9–10.3)
Creatinine, Ser: 14.94 mg/dL — ABNORMAL HIGH (ref 0.61–1.24)
GFR calc Af Amer: 3 mL/min — ABNORMAL LOW (ref >60)
GFR, EST NON AFRICAN AMERICAN: 3 mL/min — AB (ref >60)
GLUCOSE: 361 mg/dL — AB (ref 70–99)
POTASSIUM: 6.1 mmol/L — AB (ref 3.5–5.1)
SODIUM: 136 mmol/L (ref 135–145)
Total Bilirubin: 2.3 mg/dL — ABNORMAL HIGH (ref 0.3–1.2)
Total Protein: 7.5 g/dL (ref 6.5–8.1)

## 2017-09-21 LAB — BASIC METABOLIC PANEL
ANION GAP: 19 — AB (ref 5–15)
BUN: 132 mg/dL — ABNORMAL HIGH (ref 8–23)
CALCIUM: 8.1 mg/dL — AB (ref 8.9–10.3)
CO2: 10 mmol/L — ABNORMAL LOW (ref 22–32)
Chloride: 108 mmol/L (ref 98–111)
Creatinine, Ser: 14.03 mg/dL — ABNORMAL HIGH (ref 0.61–1.24)
GFR calc non Af Amer: 3 mL/min — ABNORMAL LOW (ref >60)
GFR, EST AFRICAN AMERICAN: 4 mL/min — AB (ref >60)
GLUCOSE: 256 mg/dL — AB (ref 70–99)
Potassium: 5.2 mmol/L — ABNORMAL HIGH (ref 3.5–5.1)
SODIUM: 137 mmol/L (ref 135–145)

## 2017-09-21 LAB — ETHANOL: Alcohol, Ethyl (B): <10 mg/dL (ref <10)

## 2017-09-21 LAB — HEPARIN LEVEL (UNFRACTIONATED)

## 2017-09-21 LAB — GLUCOSE, CAPILLARY
GLUCOSE-CAPILLARY: 344 mg/dL — AB (ref 70–99)
GLUCOSE-CAPILLARY: 289 mg/dL — AB (ref 70–99)
Glucose-Capillary: 214 mg/dL — ABNORMAL HIGH (ref 70–99)

## 2017-09-21 LAB — URINE DRUG SCREEN, QUALITATIVE (ARMC ONLY)
Amphetamines, Ur Screen: NOT DETECTED
BARBITURATES, UR SCREEN: NOT DETECTED
Cannabinoid 50 Ng, Ur ~~LOC~~: NOT DETECTED
Cocaine Metabolite,Ur ~~LOC~~: NOT DETECTED
MDMA (Ecstasy)Ur Screen: NOT DETECTED
METHADONE SCREEN, URINE: NOT DETECTED
Opiate, Ur Screen: NOT DETECTED
Phencyclidine (PCP) Ur S: NOT DETECTED
Tricyclic, Ur Screen: NOT DETECTED

## 2017-09-21 LAB — LACTIC ACID, PLASMA: Lactic Acid, Venous: 1.8 mmol/L (ref 0.5–1.9)

## 2017-09-21 LAB — PROTIME-INR
INR: 1.41
Prothrombin Time: 17.1 seconds — ABNORMAL HIGH (ref 11.4–15.2)

## 2017-09-21 LAB — APTT: aPTT: 31 seconds (ref 24–36)

## 2017-09-21 LAB — TROPONIN I

## 2017-09-21 LAB — CK: Total CK: 129 U/L (ref 49–397)

## 2017-09-21 MED ORDER — SODIUM BICARBONATE 8.4 % IV SOLN
INTRAVENOUS | Status: DC
  Administered 2017-09-21 – 2017-09-23 (×5): via INTRAVENOUS
  Filled 2017-09-21 (×7): qty 850

## 2017-09-21 MED ORDER — INSULIN ASPART 100 UNIT/ML ~~LOC~~ SOLN
10.0000 [IU] | Freq: Once | SUBCUTANEOUS | Status: AC
  Administered 2017-09-21: 10 [IU] via SUBCUTANEOUS
  Filled 2017-09-21: qty 1

## 2017-09-21 MED ORDER — VANCOMYCIN HCL IN DEXTROSE 1-5 GM/200ML-% IV SOLN
1000.0000 mg | Freq: Once | INTRAVENOUS | Status: AC
  Administered 2017-09-21: 1000 mg via INTRAVENOUS
  Filled 2017-09-21: qty 200

## 2017-09-21 MED ORDER — INSULIN ASPART 100 UNIT/ML ~~LOC~~ SOLN
0.0000 [IU] | Freq: Three times a day (TID) | SUBCUTANEOUS | Status: DC
  Administered 2017-09-21 – 2017-09-22 (×2): 5 [IU] via SUBCUTANEOUS
  Administered 2017-09-22: 3 [IU] via SUBCUTANEOUS
  Administered 2017-09-22: 7 [IU] via SUBCUTANEOUS
  Filled 2017-09-21 (×5): qty 1

## 2017-09-21 MED ORDER — SODIUM CHLORIDE 0.9 % IV SOLN: INTRAVENOUS | Status: DC

## 2017-09-21 MED ORDER — PIPERACILLIN-TAZOBACTAM 3.375 G IVPB 30 MIN
3.3750 g | Freq: Once | INTRAVENOUS | Status: AC
  Administered 2017-09-21: 3.375 g via INTRAVENOUS
  Filled 2017-09-21: qty 50

## 2017-09-21 MED ORDER — INSULIN ASPART 100 UNIT/ML ~~LOC~~ SOLN
0.0000 [IU] | Freq: Every day | SUBCUTANEOUS | Status: DC
  Administered 2017-09-21: 2 [IU] via SUBCUTANEOUS
  Administered 2017-09-22: 3 [IU] via SUBCUTANEOUS
  Filled 2017-09-21 (×2): qty 1

## 2017-09-21 MED ORDER — ACETAMINOPHEN 650 MG RE SUPP: 650.0000 mg | Freq: Four times a day (QID) | RECTAL | Status: DC | PRN

## 2017-09-21 MED ORDER — TAMSULOSIN HCL 0.4 MG PO CAPS
0.4000 mg | ORAL_CAPSULE | Freq: Every day | ORAL | Status: DC
  Administered 2017-09-21 – 2017-09-30 (×8): 0.4 mg via ORAL
  Filled 2017-09-21 (×8): qty 1

## 2017-09-21 MED ORDER — CEFTRIAXONE SODIUM 1 G IJ SOLR
1.0000 g | INTRAMUSCULAR | Status: DC
  Filled 2017-09-21: qty 10

## 2017-09-21 MED ORDER — SODIUM CHLORIDE 0.9 % IV SOLN
Freq: Once | INTRAVENOUS | Status: AC
  Administered 2017-09-21: 16:00:00 via INTRAVENOUS

## 2017-09-21 MED ORDER — ROSUVASTATIN CALCIUM 5 MG PO TABS
5.0000 mg | ORAL_TABLET | Freq: Every day | ORAL | Status: DC
  Administered 2017-09-23 – 2017-09-30 (×7): 5 mg via ORAL
  Filled 2017-09-21 (×7): qty 1

## 2017-09-21 MED ORDER — SODIUM CHLORIDE 0.9 % IV SOLN
Freq: Once | INTRAVENOUS | Status: AC
  Administered 2017-09-21: 15:00:00 via INTRAVENOUS

## 2017-09-21 MED ORDER — INSULIN GLARGINE 100 UNIT/ML ~~LOC~~ SOLN
8.0000 [IU] | Freq: Every day | SUBCUTANEOUS | Status: DC
  Administered 2017-09-21 – 2017-09-22 (×2): 8 [IU] via SUBCUTANEOUS
  Filled 2017-09-21 (×3): qty 0.08

## 2017-09-21 MED ORDER — HALOPERIDOL LACTATE 5 MG/ML IJ SOLN
1.0000 mg | Freq: Four times a day (QID) | INTRAMUSCULAR | Status: DC | PRN
  Administered 2017-09-25: 1 mg via INTRAVENOUS
  Filled 2017-09-21: qty 1
  Filled 2017-09-21: qty 0.2

## 2017-09-21 MED ORDER — SODIUM CHLORIDE 0.9 % IV BOLUS
1000.0000 mL | Freq: Once | INTRAVENOUS | Status: AC
  Administered 2017-09-21: 1000 mL via INTRAVENOUS

## 2017-09-21 MED ORDER — PROPRANOLOL HCL 20 MG PO TABS
80.0000 mg | ORAL_TABLET | Freq: Three times a day (TID) | ORAL | Status: DC
  Administered 2017-09-21 – 2017-09-27 (×9): 80 mg via ORAL
  Filled 2017-09-21: qty 4
  Filled 2017-09-21: qty 2
  Filled 2017-09-21: qty 4
  Filled 2017-09-21: qty 2
  Filled 2017-09-21 (×2): qty 4
  Filled 2017-09-21: qty 2
  Filled 2017-09-21 (×5): qty 4

## 2017-09-21 MED ORDER — ADULT MULTIVITAMIN W/MINERALS CH
1.0000 | ORAL_TABLET | Freq: Every day | ORAL | Status: DC
  Administered 2017-09-23 – 2017-10-01 (×9): 1 via ORAL
  Filled 2017-09-21 (×9): qty 1

## 2017-09-21 MED ORDER — SODIUM ZIRCONIUM CYCLOSILICATE 5 G PO PACK
5.0000 g | PACK | Freq: Once | ORAL | Status: AC
  Administered 2017-09-21: 5 g via ORAL
  Filled 2017-09-21: qty 1

## 2017-09-21 MED ORDER — SODIUM CHLORIDE 0.9 % IV SOLN
1.0000 g | Freq: Once | INTRAVENOUS | Status: AC
  Administered 2017-09-21: 1 g via INTRAVENOUS
  Filled 2017-09-21: qty 10

## 2017-09-21 MED ORDER — HEPARIN (PORCINE) IN NACL 100-0.45 UNIT/ML-% IJ SOLN
1200.0000 [IU]/h | INTRAMUSCULAR | Status: DC
  Administered 2017-09-21: 1200 [IU]/h via INTRAVENOUS
  Filled 2017-09-21: qty 250

## 2017-09-21 MED ORDER — FOLIC ACID 1 MG PO TABS
1.0000 mg | ORAL_TABLET | Freq: Every day | ORAL | Status: DC
  Administered 2017-09-23 – 2017-10-01 (×9): 1 mg via ORAL
  Filled 2017-09-21 (×9): qty 1

## 2017-09-21 MED ORDER — ACETAMINOPHEN 325 MG PO TABS
650.0000 mg | ORAL_TABLET | Freq: Four times a day (QID) | ORAL | Status: DC | PRN
  Administered 2017-09-25: 650 mg via ORAL
  Filled 2017-09-21: qty 2

## 2017-09-21 NOTE — H&P (Addendum)
[facility] NAME: Jordan [name]    MR#:  [id number]  DATE OF BIRTH:  Oct 07, 1950  DATE OF ADMISSION:  09/21/2017  PRIMARY CARE PHYSICIAN: Albina Billet, MD   REQUESTING/REFERRING PHYSICIAN: Dr Cephas Darby  CHIEF COMPLAINT:   Chief Complaint  Patient presents with  . Altered Mental Status    HISTORY OF PRESENT ILLNESS:  Jordan [name]  is a 67 y.o. male brought in by family secondary to altered mental status.  The patient has a history of stroke and has trouble speaking his words and has baseline right-sided weakness.  He states his appetite has been poor for the past week.  His brother who throws out his trash noticed that there was not much food in the garbage can this past week.  He has had some bruises behind the right shoulder.  The patient has not been able to tell me if he has been falling down.  The patient lives alone.  He does not use any devices with walking around.  In the ER, his creatinine was found to be 14.94 with a potassium of 6.1.  White blood cell count also elevated at 32.3 with a left shift.  Hospitalist services were contacted for further evaluation.  PAST MEDICAL HISTORY:   Past Medical History:  Diagnosis Date  . Atrial fibrillation ([place])   . Cancer ([place])    a. 06/2011 b. s/p right hemicolectomy/ colon CA  . Colon polyps   . Dysrhythmia   . Hypertension   . Hyperthyroidism    a. 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 Stroke ([place]) 03/2012   a. residual right sided weakness  . Type 1 diabetes ([place])    a. on insulin b. prior DKA     PAST SURGICAL HISTORY:   Past Surgical History:  Procedure Laterality Date  . CARDIOVERSION N/A 03/25/2014   Procedure: CARDIOVERSION;  Surgeon: Lelon Perla, MD;  Location: [facility] ENDOSCOPY;  Service: Cardiovascular;  Laterality: N/A;  . COLONOSCOPY    . COLONOSCOPY WITH PROPOFOL N/A 07/24/2017   Procedure: COLONOSCOPY WITH PROPOFOL;   Surgeon: Toledo, Benay Pike, MD;  Location: [facility] ENDOSCOPY;  Service: Gastroenterology;  Laterality: N/A;  . HEMICOLECTOMY Right     SOCIAL HISTORY:   Social History   Tobacco Use  . Smoking status: Former Research scientist (life sciences)  . Smokeless tobacco: Never Used  Substance Use Topics  . Alcohol use: No    FAMILY HISTORY:   Family History  Problem Relation Age of Onset  . Heart attack Mother     DRUG ALLERGIES:   Allergies  Allergen Reactions  . Amiodarone     REVIEW OF SYSTEMS:  CONSTITUTIONAL: No fever, fatigue or weakness.  Positive for weight gain. EYES: No blurred or double vision.  EARS, NOSE, AND THROAT: No tinnitus or ear pain. No sore throat RESPIRATORY: No cough, shortness of breath, wheezing or hemoptysis.  CARDIOVASCULAR: No chest pain, orthopnea, edema.  GASTROINTESTINAL: No nausea, vomiting, diarrhea or abdominal pain. No blood in bowel movements GENITOURINARY: No dysuria, hematuria.  ENDOCRINE: No polyuria, nocturia,  HEMATOLOGY: No anemia, easy bruising or bleeding SKIN: No rash or lesion. MUSCULOSKELETAL: No joint pain or arthritis.   NEUROLOGIC: No tingling, numbness, weakness.  PSYCHIATRY: No anxiety or depression.   MEDICATIONS AT HOME:   Prior to Admission medications   Medication Sig Start Date End Date Taking? Authorizing Provider  benazepril (LOTENSIN) 20 MG tablet Take 20  mg by mouth daily.   Yes [provider]  folic acid (FOLVITE) 1 MG tablet Take 1 mg by mouth daily.   Yes [provider]  insulin glargine (LANTUS) 100 UNIT/ML injection 12 units in the AM, 10 units at bedtime. Patient taking differently: Inject 28 Units into the skin daily.  03/30/14  Yes Theora Gianotti, NP  insulin lispro (HUMALOG) 100 UNIT/ML injection Inject under the skin 3 times daily with meals according to sliding scale (max 50u total daily)   Yes [provider]  Multiple Vitamin (MULTIVITAMIN) capsule Take 1 capsule by mouth daily.   Yes  [provider]  propranolol (INDERAL) 80 MG tablet Take 1 tablet (80 mg total) by mouth 3 (three) times daily. 03/30/14  Yes Theora Gianotti, NP  rivaroxaban (XARELTO) 20 MG TABS tablet Take 20 mg by mouth daily with supper.   Yes [provider]  rosuvastatin (CRESTOR) 5 MG tablet Take 5 mg by mouth daily.   Yes [provider]  sotalol (BETAPACE) 80 MG tablet Take 40 mg by mouth 2 (two) times daily.    Yes [provider]   Medication reconciliation still undergoing.  VITAL SIGNS:  Blood pressure 133/71, pulse 75, temperature 97.6 F (36.4 C), temperature source Oral, weight 82.1 kg, SpO2 100 %.  PHYSICAL EXAMINATION:  GENERAL:  67 y.o.-year-old patient lying in the bed with no acute distress.  EYES: Pupils equal, round, reactive to light and accommodation. No scleral icterus. Extraocular muscles intact.  HEENT: Head atraumatic, normocephalic. Oropharynx and nasopharynx clear.  NECK:  Supple, no jugular venous distention. No thyroid enlargement, no tenderness.  LUNGS: Normal breath sounds bilaterally, no wheezing, rales,rhonchi or crepitation. No use of accessory muscles of respiration.  CARDIOVASCULAR: S1, S2 normal. No murmurs, rubs, or gallops.  ABDOMEN: Soft, nontender, nondistended. Bowel sounds present. No organomegaly or mass.  EXTREMITIES: Trace pedal edema, cyanosis, or clubbing.  NEUROLOGIC: Cranial nerves II through XII are intact.  Baseline right-sided weakness 4 out of 5 upper and lower extremity.  Power 5 out of 5 left upper and lower extremity.. Sensation intact. Gait not checked.  PSYCHIATRIC: The patient is alert and .  SKIN: No rash, lesion, or ulcer.   LABORATORY PANEL:   CBC Recent Labs  Lab 09/21/17 1342  WBC 32.3*  HGB 10.2*  HCT 30.8*  PLT 538*   ------------------------------------------------------------------------------------------------------------------  Chemistries  Recent Labs  Lab 09/21/17 1342   NA 136  K 6.1*  CL 102  CO2 <7*  GLUCOSE 361*  BUN 121*  CREATININE 14.94*  CALCIUM 8.9  AST 16  ALT 18  ALKPHOS 92  BILITOT 2.3*   ------------------------------------------------------------------------------------------------------------------  Cardiac Enzymes Recent Labs  Lab 09/21/17 1342  TROPONINI <0.03   ------------------------------------------------------------------------------------------------------------------  RADIOLOGY:  Dg Chest 1 View  Result Date: 09/21/2017 CLINICAL DATA:  Altered mental status. EXAM: CHEST  1 VIEW COMPARISON:  03/19/2014 FINDINGS: Lungs are adequately inflated and otherwise clear. Cardiomediastinal silhouette and remainder the exam is unchanged. IMPRESSION: No active disease. Electronically Signed   By: Marin Olp M.D.   On: 09/21/2017 14:54   Ct Head Wo Contrast  Result Date: 09/21/2017 CLINICAL DATA:  Altered mental status.  Previous CVA EXAM: CT HEAD WITHOUT CONTRAST TECHNIQUE: Contiguous axial images were obtained from the base of the skull through the vertex without intravenous contrast. COMPARISON:  Jun 10, 2012 FINDINGS: Brain: There is mild diffuse atrophy. There is a degree of ex vacuo phenomenon involving the anterior horn of the  left lateral ventricle. There is no intracranial mass, hemorrhage, extra-axial fluid collection, or midline shift. There is evidence of a prior infarct involving portions of the superior, anterior left temporal lobe as well as much of the anterior left basal ganglia. Elsewhere there is mild small vessel disease in the centra semiovale bilaterally. No acute infarct is demonstrated on this study. Vascular: There is no evident hyperdense vessel. There is no appreciable vascular calcification. Skull: The bony calvarium  appears intact. Sinuses/Orbits: There is mucosal thickening in several ethmoid air cells. Other visualized paranasal sinuses are clear. Orbits appear symmetric bilaterally. Other: Mastoid air  cells are clear. IMPRESSION: Mild atrophy. Prior infarct involving much of the left basal ganglia, particularly anteriorly, as well as a portion of the superior left temporal lobe anteriorly. Elsewhere there is mild periventricular small vessel disease. No acute infarct is evident. No mass or hemorrhage. There is mucosal thickening in several ethmoid air cells. Electronically Signed   By: Lowella Grip III M.D.   On: 09/21/2017 14:17    EKG:   Normal sinus rhythm 75 bpm no acute ST-T wave changes.  IMPRESSION AND PLAN:   1.  Acute kidney injury with severe metabolic acidosis.  bladder scan showing only 80 cc in the bladder.  IV fluid hydration with sodium bicarb.  Case discussed with Dr. Candiss Norse nephrology to see the patient in consultation.  Likely acute kidney injury from poor appetite.  Hold nephrotoxic medications.  Renal ultrasound. 2.  Hyperkalemia.  Likely from acute kidney injury.  IV fluid hydration.  Monitor on telemetry.  Give a dose of lokelma.  Give IV calcium. 3.  Type 1 diabetes mellitus.  Will need a lower dose glargine insulin plus sliding scale.  Watch closely for hypoglycemia with the acute kidney injury. 4.  History of atrial fibrillation.  With his creatinine clearance being where it is the sotalol is contraindicated.  Also cannot give Xarelto.  We will give heparin drip and continue his propranolol. 5.  History of stroke with right-sided weakness.  Physical therapy consultation. 6.  Essential hypertension on propranolol.  Hold benazepril. 7.  Hyperlipidemia unspecified.  On low-dose Crestor.  CPK normal range 8.  Falls.  Physical therapy evaluation 9.  Possible urinary tract infection.  Give Rocephin  All the records are reviewed and case discussed with ED provider. Management plans discussed with the patient, family and they are in agreement.  CODE STATUS: Full code  TOTAL TIME TAKING CARE OF THIS PATIENT: 55 minutes, including ACP time   Loletha Grayer M.D on  09/21/2017 at 3:57 PM  Between 7am to 6pm - Pager - [phone]  After 6pm call admission pager [phone]  [facility]  [phone]  CC: Primary care physician; Albina Billet, MD
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
## 2017-09-21 NOTE — Consult Note (Signed)
ANTICOAGULATION CONSULT NOTE - Initial Consult  Pharmacy Consult for Heparin  Indication: PMH of  atrial fibrillation (Xarelto PTA)   Labs: Recent Labs    09/21/17 1342 09/21/17 1553  HGB 10.2*  --   HCT 30.8*  --   PLT 538*  --   APTT  --  31  LABPROT  --  17.1*  INR  --  1.41  HEPARINUNFRC  --  <0.10*  CREATININE 14.94*  --   CKTOTAL 129  --   TROPONINI <0.03  --     Estimated Creatinine Clearance: 4.9 mL/min (A) (by C-G formula based on SCr of 14.94 mg/dL (H)).   Medical History: Past Medical History:  Diagnosis Date  . Atrial fibrillation ([place])   . Cancer ([place])    a. 06/2011 b. s/p right hemicolectomy/ colon CA  . Colon polyps   . Dysrhythmia   . Hypertension   . Hyperthyroidism    a. 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 Stroke ([place]) 03/2012   a. residual right sided weakness  . Type 1 diabetes ([facility])    a. on insulin b. prior DKA     Assessment: Pharmacy consulted for heparin drip dosing and monitoring in 67 yo male patient admitted with acute kidney injury. Patient was taking rivaroxaban 20mg  with supper PTA. Last dose believed to be 8/16 @ around 1800 per family.   Baseline Heparin level is <0.10  Goal of Therapy:  Heparin level 0.3-0.7 units/ml aPTT 66-102 seconds Monitor platelets by anticoagulation protocol: Yes   Plan:  Baseline labs ordered.  Will not order bolus Start heparin infusion at 1200 units/hr @ 1800 on 8/17 Will need to adjust dose based on aPTT level until aPTT and HL (Anti-Xa level) correlate.  Will order aPTT level in 6 hours, and anti-Xa level at least daily while on heparin Continue to monitor H&H and platelets  Since baseline Heparin level is <0.10 will order aPTT and Heparin for first levels to ensure they correlate but anticipate that we will be able to just use Heparin level to adjust.  Paulina Fusi, PharmD, BCPS 09/21/2017 5:28 PM

## 2017-09-21 NOTE — Progress Notes (Signed)
Patient ID: Jordan [name], male   DOB: 27-Jun-1950, 67 y.o.   MRN: [id number]  ACP note  Patient and 2 sisters at the bedside  Diagnosis: Acute kidney injury with severe acidosis, hyperkalemia, type 1 diabetes, history of atrial fibrillation with stroke, hypertension, hyperlipidemia, falls, urinary tract infection.  CODE STATUS discussed and patient would like to be a full code.  Plan.  IV fluid hydration with sodium bicarb.  Renally adjust medication and hold numerous medications.  Watch creatinine and potassium closely.  With history of atrial fibrillation I cannot give Xarelto at this time will start heparin drip.  Time spent on ACP discussion 17 minutes Dr. Loletha Grayer
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
## 2017-09-21 NOTE — Consult Note (Signed)
ANTICOAGULATION CONSULT NOTE - Initial Consult  Pharmacy Consult for Heparin  Indication: PMH of  atrial fibrillation (Xarelto PTA)   Labs: Recent Labs    09/21/17 1342 09/21/17 1553  HGB 10.2*  --   HCT 30.8*  --   PLT 538*  --   APTT  --  31  LABPROT  --  17.1*  INR  --  1.41  CREATININE 14.94*  --   CKTOTAL 129  --   TROPONINI <0.03  --     Estimated Creatinine Clearance: 4.9 mL/min (A) (by C-G formula based on SCr of 14.94 mg/dL (H)).   Medical History: Past Medical History:  Diagnosis Date  . Atrial fibrillation ([place])   . Cancer ([place])    a. 06/2011 b. s/p right hemicolectomy/ colon CA  . Colon polyps   . Dysrhythmia   . Hypertension   . Hyperthyroidism    a. 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 Stroke ([place]) 03/2012   a. residual right sided weakness  . Type 1 diabetes ([facility])    a. on insulin b. prior DKA     Assessment: Pharmacy consulted for heparin drip dosing and monitoring in 67 yo male patient admitted with acute kidney injury. Patient was taking rivaroxaban 20mg  with supper PTA. Last dose believed to be 8/16 @ around 1800 per family.   Goal of Therapy:  Heparin level 0.3-0.7 units/ml aPTT 66-102 seconds Monitor platelets by anticoagulation protocol: Yes   Plan:  Baseline labs ordered.  Will not order bolus Start heparin infusion at 1200 units/hr @ 1800 on 8/17 Will need to adjust dose based on aPTT level until aPTT and HL (Anti-Xa level) correlate.  Will order aPTT level in 6 hours, and anti-Xa level at least daily while on heparin Continue to monitor H&H and platelets  Pernell Dupre, PharmD, BCPS Clinical Pharmacist 09/21/2017 4:46 PM

## 2017-09-21 NOTE — Progress Notes (Signed)
Patient ID: Jordan [name], male   DOB: March 12, 1950, 66 y.o.   MRN: [id number]  Since the patient has bilateral hydronephrosis and bladder wall thickening which could be a chronic outflow obstruction on renal ultrasound, I will place a Foley catheter.  Patient also has medical renal disease noted by increased echogenicity of the renal cortices.  Case discussed with Dr. Candiss Norse nephrology.  I will also add Flomax.  Patient on antibiotic.  I will stop heparin drip at this point.  With a history of A. fib and acute kidney injury and now having to place a Foley I would rather not give him heparin drip at this point.  Risk of stroke will be a higher.  Hopefully once kidney function improves can restart oral blood thinner.  Dr. Loletha Grayer
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
## 2017-09-22 ENCOUNTER — Inpatient Hospital Stay: Payer: Medicare Other

## 2017-09-22 ENCOUNTER — Encounter: Payer: Self-pay | Admitting: Urology

## 2017-09-22 ENCOUNTER — Inpatient Hospital Stay
Admit: 2017-09-22 | Discharge: 2017-09-22 | Disposition: A | Discharge status: Home / Self Care | Payer: Medicare Other | Attending: Adult Health | Admitting: Adult Health

## 2017-09-22 DIAGNOSIS — A419 Sepsis, unspecified organism: Secondary | ICD-10-CM

## 2017-09-22 DIAGNOSIS — N179 Acute kidney failure, unspecified: Secondary | ICD-10-CM

## 2017-09-22 DIAGNOSIS — N1330 Unspecified hydronephrosis: Secondary | ICD-10-CM

## 2017-09-22 HISTORY — PX: IR NEPHROSTOMY PLACEMENT RIGHT: IMG6064

## 2017-09-22 HISTORY — PX: IR NEPHROSTOMY PLACEMENT LEFT: IMG6063

## 2017-09-22 LAB — BLOOD GAS, ARTERIAL
Acid-base deficit: 3.7 mmol/L — ABNORMAL HIGH (ref 0.0–2.0)
BICARBONATE: 18 mmol/L — AB (ref 20.0–28.0)
FIO2: 21
O2 SAT: 95.8 %
PATIENT TEMPERATURE: 37
PO2 ART: 71 mmHg — AB (ref 83.0–108.0)
pCO2 arterial: 22 mmHg — ABNORMAL LOW (ref 32.0–48.0)
pH, Arterial: 7.52 — ABNORMAL HIGH (ref 7.350–7.450)

## 2017-09-22 LAB — URINE CULTURE: CULTURE: NO GROWTH

## 2017-09-22 LAB — MAGNESIUM
Magnesium: 2.5 mg/dL — ABNORMAL HIGH (ref 1.7–2.4)
Magnesium: 2.2 mg/dL (ref 1.7–2.4)

## 2017-09-22 LAB — COMPREHENSIVE METABOLIC PANEL
ALBUMIN: 2.6 g/dL — AB (ref 3.5–5.0)
ALT: 15 U/L (ref 0–44)
AST: 12 U/L — AB (ref 15–41)
Alkaline Phosphatase: 70 U/L (ref 38–126)
Anion gap: 20 — ABNORMAL HIGH (ref 5–15)
BILIRUBIN TOTAL: 1.7 mg/dL — AB (ref 0.3–1.2)
BUN: 122 mg/dL — AB (ref 8–23)
CHLORIDE: 107 mmol/L (ref 98–111)
CO2: 9 mmol/L — AB (ref 22–32)
Calcium: 8.2 mg/dL — ABNORMAL LOW (ref 8.9–10.3)
Creatinine, Ser: 13.72 mg/dL — ABNORMAL HIGH (ref 0.61–1.24)
GFR calc Af Amer: 4 mL/min — ABNORMAL LOW (ref >60)
GFR calc non Af Amer: 3 mL/min — ABNORMAL LOW (ref >60)
GLUCOSE: 223 mg/dL — AB (ref 70–99)
POTASSIUM: 4.7 mmol/L (ref 3.5–5.1)
Sodium: 136 mmol/L (ref 135–145)
TOTAL PROTEIN: 6.1 g/dL — AB (ref 6.5–8.1)

## 2017-09-22 LAB — CBC WITH DIFFERENTIAL/PLATELET
BASOS ABS: 0 10*3/uL (ref 0–0.1)
Basophils Relative: 0 %
Eosinophils Absolute: 0 10*3/uL (ref 0–0.7)
Eosinophils Relative: 0 %
HEMATOCRIT: 27.8 % — AB (ref 40.0–52.0)
Hemoglobin: 9.5 g/dL — ABNORMAL LOW (ref 13.0–18.0)
LYMPHS PCT: 4 %
Lymphs Abs: 1 10*3/uL (ref 1.0–3.6)
MCH: 30.1 pg (ref 26.0–34.0)
MCHC: 34.4 g/dL (ref 32.0–36.0)
MCV: 87.6 fL (ref 80.0–100.0)
MONO ABS: 1.3 10*3/uL — AB (ref 0.2–1.0)
MONOS PCT: 5 %
NEUTROS ABS: 23 10*3/uL — AB (ref 1.4–6.5)
Neutrophils Relative %: 91 %
Platelets: 412 10*3/uL (ref 150–440)
RBC: 3.17 MIL/uL — ABNORMAL LOW (ref 4.40–5.90)
RDW: 13.2 % (ref 11.5–14.5)
WBC: 25.4 10*3/uL — ABNORMAL HIGH (ref 3.8–10.6)

## 2017-09-22 LAB — BASIC METABOLIC PANEL
ANION GAP: 24 — AB (ref 5–15)
BUN: 94 mg/dL — ABNORMAL HIGH (ref 8–23)
CHLORIDE: 98 mmol/L (ref 98–111)
CO2: 18 mmol/L — AB (ref 22–32)
Calcium: 8.1 mg/dL — ABNORMAL LOW (ref 8.9–10.3)
Creatinine, Ser: 9.18 mg/dL — ABNORMAL HIGH (ref 0.61–1.24)
GFR calc non Af Amer: 5 mL/min — ABNORMAL LOW (ref >60)
GFR, EST AFRICAN AMERICAN: 6 mL/min — AB (ref >60)
GLUCOSE: 279 mg/dL — AB (ref 70–99)
Potassium: 3.4 mmol/L — ABNORMAL LOW (ref 3.5–5.1)
Sodium: 140 mmol/L (ref 135–145)

## 2017-09-22 LAB — GLUCOSE, CAPILLARY
Glucose-Capillary: 205 mg/dL — ABNORMAL HIGH (ref 70–99)
Glucose-Capillary: 267 mg/dL — ABNORMAL HIGH (ref 70–99)
Glucose-Capillary: 235 mg/dL — ABNORMAL HIGH (ref 70–99)
Glucose-Capillary: 286 mg/dL — ABNORMAL HIGH (ref 70–99)
Glucose-Capillary: 305 mg/dL — ABNORMAL HIGH (ref 70–99)
Glucose-Capillary: 263 mg/dL — ABNORMAL HIGH (ref 70–99)

## 2017-09-22 LAB — PROTIME-INR
INR: 1.2
Prothrombin Time: 15.1 seconds (ref 11.4–15.2)

## 2017-09-22 LAB — ECHOCARDIOGRAM COMPLETE: WEIGHTICAEL: 2895.96 [oz_av]

## 2017-09-22 LAB — PHOSPHORUS
PHOSPHORUS: 6.7 mg/dL — AB (ref 2.5–4.6)
Phosphorus: 8.7 mg/dL — ABNORMAL HIGH (ref 2.5–4.6)

## 2017-09-22 LAB — BRAIN NATRIURETIC PEPTIDE: B NATRIURETIC PEPTIDE 5: 878 pg/mL — AB (ref 0.0–100.0)

## 2017-09-22 LAB — HEMOGLOBIN A1C
HEMOGLOBIN A1C: 7.2 % — AB (ref 4.8–5.6)
MEAN PLASMA GLUCOSE: 159.94 mg/dL

## 2017-09-22 LAB — LACTIC ACID, PLASMA: Lactic Acid, Venous: 1.2 mmol/L (ref 0.5–1.9)

## 2017-09-22 LAB — TROPONIN I
TROPONIN I: 0.09 ng/mL — AB (ref <0.03)
Troponin I: 0.06 ng/mL (ref <0.03)

## 2017-09-22 LAB — CORTISOL: Cortisol, Plasma: 44.7 ug/dL

## 2017-09-22 LAB — MRSA PCR SCREENING: MRSA BY PCR: NEGATIVE

## 2017-09-22 LAB — TSH: TSH: 1.911 u[IU]/mL (ref 0.350–4.500)

## 2017-09-22 LAB — PSA: Prostatic Specific Antigen: 3.96 ng/mL (ref 0.00–4.00)

## 2017-09-22 MED ORDER — FENTANYL CITRATE (PF) 100 MCG/2ML IJ SOLN
INTRAMUSCULAR | Status: AC
  Filled 2017-09-22: qty 2

## 2017-09-22 MED ORDER — SODIUM CHLORIDE 0.9 % IV SOLN
2.0000 g | INTRAVENOUS | Status: DC
  Administered 2017-09-22 – 2017-09-23 (×2): 2 g via INTRAVENOUS
  Filled 2017-09-22 (×2): qty 2

## 2017-09-22 MED ORDER — INSULIN ASPART 100 UNIT/ML ~~LOC~~ SOLN: 0.0000 [IU] | Freq: Four times a day (QID) | SUBCUTANEOUS | Status: DC

## 2017-09-22 MED ORDER — CHLORHEXIDINE GLUCONATE CLOTH 2 % EX PADS
6.0000 | MEDICATED_PAD | Freq: Every day | CUTANEOUS | Status: DC
  Administered 2017-09-22 – 2017-09-26 (×4): 6 via TOPICAL

## 2017-09-22 MED ORDER — DILTIAZEM HCL 25 MG/5ML IV SOLN: 10.0000 mg | Freq: Once | INTRAVENOUS | Status: DC

## 2017-09-22 MED ORDER — CIPROFLOXACIN IN D5W 400 MG/200ML IV SOLN
400.0000 mg | Freq: Once | INTRAVENOUS | Status: AC
  Administered 2017-09-22: 400 mg via INTRAVENOUS
  Filled 2017-09-22: qty 200

## 2017-09-22 MED ORDER — SODIUM CHLORIDE 0.45 % IV BOLUS
1000.0000 mL | Freq: Once | INTRAVENOUS | Status: AC
  Administered 2017-09-22: 1000 mL via INTRAVENOUS

## 2017-09-22 MED ORDER — FAMOTIDINE IN NACL 20-0.9 MG/50ML-% IV SOLN
20.0000 mg | INTRAVENOUS | Status: DC
  Administered 2017-09-22 – 2017-09-24 (×3): 20 mg via INTRAVENOUS
  Filled 2017-09-22 (×2): qty 50

## 2017-09-22 MED ORDER — MIDAZOLAM HCL 2 MG/2ML IJ SOLN
INTRAMUSCULAR | Status: AC | PRN
  Administered 2017-09-22 (×2): 1 mg via INTRAVENOUS

## 2017-09-22 MED ORDER — DILTIAZEM HCL 100 MG IV SOLR
5.0000 mg/h | INTRAVENOUS | Status: DC
  Administered 2017-09-22: 10 mg/h via INTRAVENOUS
  Administered 2017-09-22: 5 mg/h via INTRAVENOUS
  Administered 2017-09-23: 15 mg/h via INTRAVENOUS
  Filled 2017-09-22 (×6): qty 100

## 2017-09-22 MED ORDER — INSULIN ASPART 100 UNIT/ML ~~LOC~~ SOLN
0.0000 [IU] | Freq: Four times a day (QID) | SUBCUTANEOUS | Status: DC
  Administered 2017-09-23 (×2): 8 [IU] via SUBCUTANEOUS
  Administered 2017-09-23: 15 [IU] via SUBCUTANEOUS
  Administered 2017-09-23: 5 [IU] via SUBCUTANEOUS
  Administered 2017-09-24: 11 [IU] via SUBCUTANEOUS
  Administered 2017-09-24: 5 [IU] via SUBCUTANEOUS
  Administered 2017-09-24: 11 [IU] via SUBCUTANEOUS
  Administered 2017-09-24: 8 [IU] via SUBCUTANEOUS
  Administered 2017-09-25: 11 [IU] via SUBCUTANEOUS
  Administered 2017-09-25: 8 [IU] via SUBCUTANEOUS
  Filled 2017-09-22 (×10): qty 1

## 2017-09-22 MED ORDER — DIGOXIN 0.25 MG/ML IJ SOLN
0.2500 mg | Freq: Once | INTRAMUSCULAR | Status: AC
  Administered 2017-09-22: 0.25 mg via INTRAVENOUS
  Filled 2017-09-22: qty 2

## 2017-09-22 MED ORDER — DILTIAZEM HCL 25 MG/5ML IV SOLN
20.0000 mg | Freq: Once | INTRAVENOUS | Status: AC
  Administered 2017-09-22: 20 mg via INTRAVENOUS
  Filled 2017-09-22: qty 5

## 2017-09-22 MED ORDER — FENTANYL CITRATE (PF) 100 MCG/2ML IJ SOLN
INTRAMUSCULAR | Status: AC | PRN
  Administered 2017-09-22 (×2): 25 ug via INTRAVENOUS

## 2017-09-22 MED ORDER — POTASSIUM CHLORIDE 10 MEQ/50ML IV SOLN
10.0000 meq | INTRAVENOUS | Status: AC
  Administered 2017-09-22 – 2017-09-23 (×2): 10 meq via INTRAVENOUS
  Filled 2017-09-22 (×2): qty 50

## 2017-09-22 MED ORDER — ESMOLOL HCL-SODIUM CHLORIDE 2000 MG/100ML IV SOLN
25.0000 ug/kg/min | INTRAVENOUS | Status: DC
  Filled 2017-09-22: qty 100

## 2017-09-22 MED ORDER — DILTIAZEM LOAD VIA INFUSION
20.0000 mg | Freq: Once | INTRAVENOUS | Status: AC
  Administered 2017-09-22: 20 mg via INTRAVENOUS
  Filled 2017-09-22: qty 20

## 2017-09-22 MED ORDER — IOPAMIDOL (ISOVUE-300) INJECTION 61%
30.0000 mL | Freq: Once | INTRAVENOUS | Status: AC | PRN
  Administered 2017-09-22: 10 mL via INTRAVENOUS

## 2017-09-22 MED ORDER — FUROSEMIDE 10 MG/ML IJ SOLN
80.0000 mg | Freq: Once | INTRAMUSCULAR | Status: AC
  Administered 2017-09-22: 80 mg via INTRAVENOUS
  Filled 2017-09-22: qty 8

## 2017-09-22 MED ORDER — MIDAZOLAM HCL 5 MG/5ML IJ SOLN
INTRAMUSCULAR | Status: AC
  Filled 2017-09-22: qty 5

## 2017-09-22 MED ORDER — HEPARIN SODIUM (PORCINE) 1000 UNIT/ML IJ SOLN
1000.0000 [IU] | INTRAMUSCULAR | Status: DC | PRN
  Filled 2017-09-22: qty 6

## 2017-09-22 MED ORDER — DIGOXIN 0.25 MG/ML IJ SOLN
0.1250 mg | Freq: Once | INTRAMUSCULAR | Status: AC
  Administered 2017-09-22: 0.125 mg via INTRAVENOUS
  Filled 2017-09-22: qty 2

## 2017-09-22 MED ORDER — CIPROFLOXACIN IN D5W 400 MG/200ML IV SOLN
400.0000 mg | Freq: Two times a day (BID) | INTRAVENOUS | Status: DC
  Administered 2017-09-22: 400 mg via INTRAVENOUS

## 2017-09-22 MED ORDER — SODIUM BICARBONATE 8.4 % IV SOLN
150.0000 meq | Freq: Once | INTRAVENOUS | Status: AC
  Administered 2017-09-22: 150 meq via INTRAVENOUS
  Filled 2017-09-22 (×2): qty 150

## 2017-09-22 NOTE — Significant Event (Signed)
Rapid Response Event Note  Overview: Time Called: 0335 Arrival Time: 0338 Event Type: Other (Comment)(sepsis/fluid overload/SVT)  Initial Focused Assessment: Pt. In room, HR in 140's-160's: confirmed with Central Telemetry Afib RVR.  Pt. RR: 30's-40's on 2 L [place].  BUN & Cr. 132 & 14.03 respectively from last lab draw., per Care RN Trish- UOP 25 mL total post recent 1 L NS bolus after foley placed. Pt. Alert, BP, SpO2, temp all stable. Breath sounds coarse, per pt. He does not feel short of breath.  Interventions: Paged and spoke with Hospitalist, Dr. Jodell Cipro discussed Vitals, recent labs, pt. Presentation and UOP.  Stopped IVF, STAT CXR & lab work recommended and ordered.  Recommended transfer to ICU for Stepdown.  Rolled with pt. @ 0410 after MD came bedside.    Event Summary: Name of Physician Notified: Dr. Jodell Cipro at [phone]  Name of Consulting Physician Notified: Ms. Patria Mane, NP at 0400  Outcome: Transferred (Comment)(ICU-18 for Stepdown)  Event End Time: [place]

## 2017-09-22 NOTE — Procedures (Signed)
  Pre-operative Diagnosis: AKI, sepsis and bilateral hydronephrosis       Post-operative Diagnosis: AKI, sepsis and bilateral hydronephrosis   Indications: Needs decompression of renal collecting systems  Procedure: Bilateral nephrostomy tube placement  Findings: Mild to moderate bilateral hydronephrosis.  Placement of bilateral 10 Fr nephrostomy tubes.  Both tubes draining well.  Complications: none     EBL: Minimal  Plan: Follow tube outputs and continue care in ICU.

## 2017-09-22 NOTE — Progress Notes (Signed)
Pt transferred to stepdown unit accy by nursing staff

## 2017-09-22 NOTE — Progress Notes (Signed)
[place] [facility] [place] NAME: Jordan [name]    MR#:  [id number]  DATE OF BIRTH:  03-16-1950  SUBJECTIVE:   Patient very lethargic sleepy. Currently getting hemodialysis. No Family in the ICU REVIEW OF SYSTEMS:   Review of Systems  Unable to perform ROS: Mental status change   DRUG ALLERGIES:   Allergies  Allergen Reactions  . Amiodarone     VITALS:  Blood pressure 92/62, pulse (!) 112, temperature 98.1 F (36.7 C), temperature source Axillary, resp. rate 20, weight 82.1 kg, SpO2 100 %.  PHYSICAL EXAMINATION:   Physical Exam  GENERAL:  67 y.o.-year-old patient lying in the bed with no acute distress. Critically ill EYES: Pupils equal, round, reactive to light and accommodation. No scleral icterus. Extraocular muscles intact.  HEENT: Head atraumatic, normocephalic. Oropharynx and nasopharynx clear.  NECK:  Supple, no jugular venous distention. No thyroid enlargement, no tenderness.  LUNGS: Normal breath sounds bilaterally, no wheezing, rales, rhonchi. No use of accessory muscles of respiration.  CARDIOVASCULAR: S1, S2 normal. No murmurs, rubs, or gallops.  ABDOMEN: Soft, nontender, nondistended. Bowel sounds present. No organomegaly or mass.  EXTREMITIES: No cyanosis, clubbing or edema b/l.   Right groin HD access NEUROLOGIC: unable to assess due to mentation. Moves all extremities well   PSYCHIATRIC:  patient is lethargic and very sleepy SKIN: No obvious rash, lesion, or ulcer.   LABORATORY PANEL:  CBC Recent Labs  Lab 09/22/17 0415  WBC 25.4*  HGB 9.5*  HCT 27.8*  PLT 412    Chemistries  Recent Labs  Lab 09/22/17 0415  NA 136  K 4.7  CL 107  CO2 9*  GLUCOSE 223*  BUN 122*  CREATININE 13.72*  CALCIUM 8.2*  MG 2.5*  AST 12*  ALT 15  ALKPHOS 70  BILITOT 1.7*   Cardiac Enzymes Recent Labs  Lab 09/22/17 0415  TROPONINI 0.06*   RADIOLOGY:  Ct Abdomen Pelvis Wo Contrast  Result Date:  09/22/2017 CLINICAL DATA:  Acute renal failure. EXAM: CT ABDOMEN AND PELVIS WITHOUT CONTRAST TECHNIQUE: Multidetector CT imaging of the abdomen and pelvis was performed following the standard protocol without IV contrast. COMPARISON:  06/28/2011 FINDINGS: Lower chest: Small bilateral pleural effusions with associated atelectasis. Calcified plaque over the right coronary artery. Hepatobiliary: Gallbladder is contracted. Liver and biliary tree are normal. Pancreas: Normal. Spleen: Normal. Adrenals/Urinary Tract: Adrenal glands are normal. Kidneys are normal in size with symmetric bilateral mild hydroureteronephrosis. No renal stones. 1.4 cm cyst over the upper pole right kidney. Foley catheter is present within a decompressed bladder as there is increased density to the fluid within the bladder possibly representing hemorrhagic debris in may be partially responsible for the hydroureteronephrosis. Stomach/Bowel: Stomach and small bowel are within normal. Appendix is not visualized. Evidence of patient's previous partial right colectomy. Vascular/Lymphatic: Minimal calcified plaque over the abdominal aorta. No adenopathy. Reproductive: Within normal. Other: Left femoral venous catheter is present with tip over the left external iliac pain near the junction to the common iliac vein. There is intermediate density material over the left pelvis which crosses midline at and just above the level of the seminal vesicles. This likely represents hemorrhagic debris. This encompasses the distal left ureter and is also adjacent the distal right ureter lesser degree and may be partially responsible for the bilateral hydroureteronephrosis. Musculoskeletal: Degenerative change of the spine and hips. IMPRESSION: Bilateral symmetric mild hydroureteronephrosis. Foley catheter present within a collapsed bladder as there is increased density material  within the bladder possibly hemorrhagic debris. There is also increased density material  over the left pelvis with some crossing midline adjacent both distal ureters left worse than right likely hemorrhagic debris. These factors may be responsible for the hydroureteronephrosis. Small bilateral pleural effusions with associated bibasilar atelectasis. 1.4 cm right renal cyst. Previous partial right colectomy. Left femoral venous catheter with tip over the external iliac vein near the junction to common iliac vein. Electronically Signed   By: Marin Olp M.D.   On: 09/22/2017 09:58   Dg Chest 1 View  Result Date: 09/21/2017 CLINICAL DATA:  Altered mental status. EXAM: CHEST  1 VIEW COMPARISON:  03/19/2014 FINDINGS: Lungs are adequately inflated and otherwise clear. Cardiomediastinal silhouette and remainder the exam is unchanged. IMPRESSION: No active disease. Electronically Signed   By: Marin Olp M.D.   On: 09/21/2017 14:54   Ct Head Wo Contrast  Result Date: 09/21/2017 CLINICAL DATA:  Altered mental status.  Previous CVA EXAM: CT HEAD WITHOUT CONTRAST TECHNIQUE: Contiguous axial images were obtained from the base of the skull through the vertex without intravenous contrast. COMPARISON:  Jun 10, 2012 FINDINGS: Brain: There is mild diffuse atrophy. There is a degree of ex vacuo phenomenon involving the anterior horn of the left lateral ventricle. There is no intracranial mass, hemorrhage, extra-axial fluid collection, or midline shift. There is evidence of a prior infarct involving portions of the superior, anterior left temporal lobe as well as much of the anterior left basal ganglia. Elsewhere there is mild small vessel disease in the centra semiovale bilaterally. No acute infarct is demonstrated on this study. Vascular: There is no evident hyperdense vessel. There is no appreciable vascular calcification. Skull: The bony calvarium  appears intact. Sinuses/Orbits: There is mucosal thickening in several ethmoid air cells. Other visualized paranasal sinuses are clear. Orbits appear symmetric  bilaterally. Other: Mastoid air cells are clear. IMPRESSION: Mild atrophy. Prior infarct involving much of the left basal ganglia, particularly anteriorly, as well as a portion of the superior left temporal lobe anteriorly. Elsewhere there is mild periventricular small vessel disease. No acute infarct is evident. No mass or hemorrhage. There is mucosal thickening in several ethmoid air cells. Electronically Signed   By: Lowella Grip III M.D.   On: 09/21/2017 14:17   US Renal  Result Date: 09/21/2017 CLINICAL DATA:  67 y/o M; acute kidney injury. History of colon cancer. EXAM: RENAL / URINARY TRACT ULTRASOUND COMPLETE COMPARISON:  None. FINDINGS: Right Kidney: Length: 12.0. Mildly increased cortical echogenicity. Right kidney upper pole 1.5 cm and mid pole 1.8 cm simple cysts. Moderate hydronephrosis. Left Kidney: Length: 11.9. Mild increased cortical echogenicity. No mass. Moderate hydronephrosis. Bladder: Thickened bladder wall to 1.7 cm. IMPRESSION: 1. Moderate bilateral hydronephrosis. 2. Mild increased echogenicity of the renal cortices compatible with medical renal disease. 3. Bladder wall thickening may reflect cystitis or sequelae of chronic outflow obstruction and underdistention. Electronically Signed   By: Kristine Garbe M.D.   On: 09/21/2017 18:25   Dg Chest Port 1 View  Result Date: 09/22/2017 CLINICAL DATA:  Dyspnea and tachycardia. EXAM: PORTABLE CHEST 1 VIEW COMPARISON:  09/21/2017 FINDINGS: Heart size and pulmonary vascularity are normal. Since previous study, there is interval development of diffuse nodular perihilar infiltration bilaterally with some peribronchial thickening. This likely represents bronchopneumonia although edema or atypical infectious process could also have this appearance. No blunting of costophrenic angles. No pneumothorax. Mediastinal contours appear intact. IMPRESSION: Interval development of diffuse bilateral nodular perihilar infiltrates with  peribronchial thickening  suggesting bronchopneumonia or edema. Electronically Signed   By: Lucienne Capers M.D.   On: 09/22/2017 04:29   ASSESSMENT AND PLAN:  Deondrae Mcgrail  is a 67 y.o. male brought in by family secondary to altered mental status.  The patient has a history of stroke and has trouble speaking his words and has baseline right-sided weakness.  He states his appetite has been poor for the past week.  In the ER, his creatinine was found to be 14.94 with a potassium of 6.1.   1.  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  bladder scan showing only 80 cc in the bladder.  IV fluid hydration with sodium bicarb.  Case discussed with Dr. Candiss Norse nephrology to see the patient in consultation.  Likely acute kidney injury from poor appetite.  Hold nephrotoxic medications. -Patient is started on urgent hemodialysis through right femoral HD Access -by urology. Patient to get bilateral nephrectomy tube secondary to hydronephrosis with possible obstructive mass in the bladder as opposed to hemorrhagic debris  2.  Hyperkalemia.  Likely from acute kidney injury.  IV fluid hydration.  Monitor on telemetry.  pt got a dose of lokelma and IV calcium.  3.  Type 1 diabetes mellitus.  Will need a lower dose glargine insulin plus sliding scale.  Watch closely for hypoglycemia with the acute kidney injury.  4.  History of atrial fibrillation.  With his creatinine clearance being where it is the sotalol is contraindicated.  Also cannot give Xarelto.  We will give heparin drip and continue his propranolol.  5.  History of stroke with right-sided weakness.  Physical therapy consultation.  6.  Essential hypertension on propranolol.  Hold benazepril.  7.  Hyperlipidemia unspecified.  On low-dose Crestor.  CPK normal range  8.  Falls.  Physical therapy evaluation  9.  Possible urinary tract infection.  Give Rocephin  Overall pt appears quiet sick  Case discussed  with Care Management/Social Worker. Management plans discussed with the patient, family and they are in agreement.  CODE STATUS: full  DVT Prophylaxis: heparin  TOTAL TIME TAKING CARE OF THIS PATIENT: *35* minutes.  >50% time spent on counselling and coordination of care  POSSIBLE D/C IN *few* DAYS, DEPENDING ON CLINICAL CONDITION.  Note: This dictation was prepared with Dragon dictation along with smaller phrase technology. Any transcriptional errors that result from this process are unintentional.  Fritzi Mandes M.D on 09/22/2017 at 2:09 PM  Between 7am to 6pm - Pager - [phone]  After 6pm go to [email or web address] - password [email or web address] [facility]  Office  [phone]  CC: Primary care physician; Albina Billet, MDPatient ID: Jordan [name], male   DOB: [birth year], 67 y.o.   MRN: [id number]

## 2017-09-22 NOTE — Progress Notes (Signed)
Pt back from IR. Nephrostomy tubes in place. Sites clean dry and intact. Will continue to monitor.

## 2017-09-22 NOTE — Progress Notes (Signed)
This note also relates to the following rows which could not be included: Pulse Rate - Cannot attach notes to unvalidated device data Resp - Cannot attach notes to unvalidated device data BP - Cannot attach notes to unvalidated device data  Hd completed  

## 2017-09-22 NOTE — Progress Notes (Signed)
Prime doc notified increased HR , restlessness, decreases urine output, orders given

## 2017-09-22 NOTE — Progress Notes (Signed)
This note also relates to the following rows which could not be included: Pulse Rate - Cannot attach notes to unvalidated device data Resp - Cannot attach notes to unvalidated device data SpO2 - Cannot attach notes to unvalidated device data  Hd started  

## 2017-09-22 NOTE — Consult Note (Signed)
Chief Complaint: Patient was seen in consultation today for placement of bilateral nephrostomy tubes.  Referring Physician(s): Irine Seal, MD  Patient Status: [facility] - In-pt  History of Present Illness: Jordan [name] is a 67 y.o. male with prior history of stroke and brought to the hospital yesterday by the family due to altered mental status.  Patient was found to be in acute renal failure with a elevated potassium level and elevated white blood cell count.  Imaging work-up demonstrated bilateral hydronephrosis and bladder wall thickening.  Patient has a temporary dialysis catheter and just completed first hemodialysis session.  Patient was evaluated by urology for the bilateral hydronephrosis.  Due to the bladder wall thickening and current medical condition, the patient is not felt to be a good candidate for general anesthesia and retrograde ureter stent placement.  Urology requested evaluation for bilateral percutaneous nephrostomy tubes.  The patient is awake and responsive.  Difficult to communicate with the patient due to some confusion and prior stroke.  Patient's sister is at the bedside.  Past medical history is significant for atrial fibrillation but the patient has been off Xarelto since coming to the hospital.  Past medical history is also significant for colon cancer and status post right hemicolectomy.  Past Medical History:  Diagnosis Date  . Atrial fibrillation ([place])   . Cancer ([place])    a. 06/2011 b. s/p right hemicolectomy/ colon CA  . Colon polyps   . Dysrhythmia   . Hypertension   . Hyperthyroidism    a. 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 Stroke ([place]) 03/2012   a. residual right sided weakness  . Type 1 diabetes ([place])    a. on insulin b. prior DKA     Past Surgical History:  Procedure Laterality Date  . CARDIOVERSION N/A 03/25/2014   Procedure: CARDIOVERSION;  Surgeon: Lelon Perla, MD;  Location: [facility] ENDOSCOPY;  Service:  Cardiovascular;  Laterality: N/A;  . COLONOSCOPY    . COLONOSCOPY WITH PROPOFOL N/A 07/24/2017   Procedure: COLONOSCOPY WITH PROPOFOL;  Surgeon: Toledo, Benay Pike, MD;  Location: [facility] ENDOSCOPY;  Service: Gastroenterology;  Laterality: N/A;  . HEMICOLECTOMY Right     Allergies: Amiodarone  Medications: Prior to Admission medications   Medication Sig Start Date End Date Taking? Authorizing Provider  benazepril (LOTENSIN) 20 MG tablet Take 20 mg by mouth daily.   Yes [provider]  folic acid (FOLVITE) 1 MG tablet Take 1 mg by mouth daily.   Yes [provider]  insulin glargine (LANTUS) 100 UNIT/ML injection 12 units in the AM, 10 units at bedtime. Patient taking differently: Inject 28 Units into the skin daily.  03/30/14  Yes Theora Gianotti, NP  insulin lispro (HUMALOG) 100 UNIT/ML injection    Yes [provider]  Multiple Vitamin (MULTIVITAMIN) capsule Take 1 capsule by mouth daily.   Yes [provider]  propranolol (INDERAL) 80 MG tablet Take 1 tablet (80 mg total) by mouth 3 (three) times daily. 03/30/14  Yes Theora Gianotti, NP  rivaroxaban (XARELTO) 20 MG TABS tablet Take 20 mg by mouth daily with supper.   Yes [provider]  rosuvastatin (CRESTOR) 5 MG tablet Take 5 mg by mouth daily.   Yes [provider]  sotalol (BETAPACE) 80 MG tablet Take 40 mg by mouth 2 (two) times daily.    Yes [provider]     Family History  Problem Relation Age of Onset  .  Heart attack Mother     Social History   Socioeconomic History  . Marital status: Single    Spouse name: Not on file  . Number of children: Not on file  . Years of education: Not on file  . Highest education level: Not on file  Occupational History  . Not on file  Social Needs  . Financial resource strain: Not hard at all  . Food insecurity:    Worry: Never true    Inability: Never true  . Transportation needs:    Medical: Yes     Non-medical: Yes  Tobacco Use  . Smoking status: Former Smoker    Last attempt to quit: 09/21/1973    Years since quitting: 44.0  . Smokeless tobacco: Never Used  Substance and Sexual Activity  . Alcohol use: No  . Drug use: No  . Sexual activity: Not Currently  Lifestyle  . Physical activity:    Days per week: Patient refused    Minutes per session: Patient refused  . Stress: Not on file  Relationships  . Social connections:    Talks on phone: Once a week    Gets together: Once a week    Attends religious service: 1 to 4 times per year    Active member of club or organization: No    Attends meetings of clubs or organizations: Never    Relationship status: Never married  Other Topics Concern  . Not on file  Social History Narrative  . Not on file     Review of Systems  Neurological: Positive for speech difficulty.    Vital Signs: BP 92/62 (BP Location: Left Arm)   Pulse (!) 112   Temp 98.1 F (36.7 C) (Axillary)   Resp 20   Wt 82.1 kg   SpO2 100%   BMI 26.73 kg/m   Physical Exam  HENT:  Mouth/Throat: Oropharynx is clear and moist.  Cardiovascular:  No murmur heard. Tachycardic.  Pulmonary/Chest: He has rales.  Extensive bilateral rales.  Abdominal: Soft.  Neurological: He is alert.  Confused and unable to completely answer questions or follow conversation.    Imaging: Ct Abdomen Pelvis Wo Contrast  Result Date: 09/22/2017 CLINICAL DATA:  Acute renal failure. EXAM: CT ABDOMEN AND PELVIS WITHOUT CONTRAST TECHNIQUE: Multidetector CT imaging of the abdomen and pelvis was performed following the standard protocol without IV contrast. COMPARISON:  06/28/2011 FINDINGS: Lower chest: Small bilateral pleural effusions with associated atelectasis. Calcified plaque over the right coronary artery. Hepatobiliary: Gallbladder is contracted. Liver and biliary tree are normal. Pancreas: Normal. Spleen: Normal. Adrenals/Urinary Tract: Adrenal glands are normal. Kidneys are  normal in size with symmetric bilateral mild hydroureteronephrosis. No renal stones. 1.4 cm cyst over the upper pole right kidney. Foley catheter is present within a decompressed bladder as there is increased density to the fluid within the bladder possibly representing hemorrhagic debris in may be partially responsible for the hydroureteronephrosis. Stomach/Bowel: Stomach and small bowel are within normal. Appendix is not visualized. Evidence of patient's previous partial right colectomy. Vascular/Lymphatic: Minimal calcified plaque over the abdominal aorta. No adenopathy. Reproductive: Within normal. Other: Left femoral venous catheter is present with tip over the left external iliac pain near the junction to the common iliac vein. There is intermediate density material over the left pelvis which crosses midline at and just above the level of the seminal vesicles. This likely represents hemorrhagic debris. This encompasses the distal left ureter and is also adjacent the distal right ureter lesser degree and  may be partially responsible for the bilateral hydroureteronephrosis. Musculoskeletal: Degenerative change of the spine and hips. IMPRESSION: Bilateral symmetric mild hydroureteronephrosis. Foley catheter present within a collapsed bladder as there is increased density material within the bladder possibly hemorrhagic debris. There is also increased density material over the left pelvis with some crossing midline adjacent both distal ureters left worse than right likely hemorrhagic debris. These factors may be responsible for the hydroureteronephrosis. Small bilateral pleural effusions with associated bibasilar atelectasis. 1.4 cm right renal cyst. Previous partial right colectomy. Left femoral venous catheter with tip over the external iliac vein near the junction to common iliac vein. Electronically Signed   By: Marin Olp M.D.   On: 09/22/2017 09:58   Dg Chest 1 View  Result Date: 09/21/2017 CLINICAL  DATA:  Altered mental status. EXAM: CHEST  1 VIEW COMPARISON:  03/19/2014 FINDINGS: Lungs are adequately inflated and otherwise clear. Cardiomediastinal silhouette and remainder the exam is unchanged. IMPRESSION: No active disease. Electronically Signed   By: Marin Olp M.D.   On: 09/21/2017 14:54   Ct Head Wo Contrast  Result Date: 09/21/2017 CLINICAL DATA:  Altered mental status.  Previous CVA EXAM: CT HEAD WITHOUT CONTRAST TECHNIQUE: Contiguous axial images were obtained from the base of the skull through the vertex without intravenous contrast. COMPARISON:  Jun 10, 2012 FINDINGS: Brain: There is mild diffuse atrophy. There is a degree of ex vacuo phenomenon involving the anterior horn of the left lateral ventricle. There is no intracranial mass, hemorrhage, extra-axial fluid collection, or midline shift. There is evidence of a prior infarct involving portions of the superior, anterior left temporal lobe as well as much of the anterior left basal ganglia. Elsewhere there is mild small vessel disease in the centra semiovale bilaterally. No acute infarct is demonstrated on this study. Vascular: There is no evident hyperdense vessel. There is no appreciable vascular calcification. Skull: The bony calvarium  appears intact. Sinuses/Orbits: There is mucosal thickening in several ethmoid air cells. Other visualized paranasal sinuses are clear. Orbits appear symmetric bilaterally. Other: Mastoid air cells are clear. IMPRESSION: Mild atrophy. Prior infarct involving much of the left basal ganglia, particularly anteriorly, as well as a portion of the superior left temporal lobe anteriorly. Elsewhere there is mild periventricular small vessel disease. No acute infarct is evident. No mass or hemorrhage. There is mucosal thickening in several ethmoid air cells. Electronically Signed   By: Lowella Grip III M.D.   On: 09/21/2017 14:17   US Renal  Result Date: 09/21/2017 CLINICAL DATA:  67 y/o M; acute kidney  injury. History of colon cancer. EXAM: RENAL / URINARY TRACT ULTRASOUND COMPLETE COMPARISON:  None. FINDINGS: Right Kidney: Length: 12.0. Mildly increased cortical echogenicity. Right kidney upper pole 1.5 cm and mid pole 1.8 cm simple cysts. Moderate hydronephrosis. Left Kidney: Length: 11.9. Mild increased cortical echogenicity. No mass. Moderate hydronephrosis. Bladder: Thickened bladder wall to 1.7 cm. IMPRESSION: 1. Moderate bilateral hydronephrosis. 2. Mild increased echogenicity of the renal cortices compatible with medical renal disease. 3. Bladder wall thickening may reflect cystitis or sequelae of chronic outflow obstruction and underdistention. Electronically Signed   By: Kristine Garbe M.D.   On: 09/21/2017 18:25   Dg Chest Port 1 View  Result Date: 09/22/2017 CLINICAL DATA:  Dyspnea and tachycardia. EXAM: PORTABLE CHEST 1 VIEW COMPARISON:  09/21/2017 FINDINGS: Heart size and pulmonary vascularity are normal. Since previous study, there is interval development of diffuse nodular perihilar infiltration bilaterally with some peribronchial thickening. This likely represents bronchopneumonia although  edema or atypical infectious process could also have this appearance. No blunting of costophrenic angles. No pneumothorax. Mediastinal contours appear intact. IMPRESSION: Interval development of diffuse bilateral nodular perihilar infiltrates with peribronchial thickening suggesting bronchopneumonia or edema. Electronically Signed   By: Lucienne Capers M.D.   On: 09/22/2017 04:29    Labs:  CBC: Recent Labs    09/21/17 1342 09/22/17 0415  WBC 32.3* 25.4*  HGB 10.2* 9.5*  HCT 30.8* 27.8*  PLT 538* 412    COAGS: Recent Labs    09/21/17 1553 09/22/17 1224  INR 1.41 1.20  APTT 31  --     BMP: Recent Labs    09/21/17 1342 09/21/17 2235 09/22/17 0415  NA 136 137 136  K 6.1* 5.2* 4.7  CL 102 108 107  CO2 <7* 10* 9*  GLUCOSE 361* 256* 223*  BUN 121* 132* 122*  CALCIUM  8.9 8.1* 8.2*  CREATININE 14.94* 14.03* 13.72*  GFRNONAA 3* 3* 3*  GFRAA 3* 4* 4*    LIVER FUNCTION TESTS: Recent Labs    09/21/17 1342 09/22/17 0415  BILITOT 2.3* 1.7*  AST 16 12*  ALT 18 15  ALKPHOS 92 70  PROT 7.5 6.1*  ALBUMIN 3.2* 2.6*    TUMOR MARKERS: No results for input(s): AFPTM, CEA, CA199, CHROMGRNA in the last 8760 hours.  Assessment and Plan: 67 year old male with acute kidney injury, sepsis and bilateral hydronephrosis.  Patient is not a candidate for general anesthesia and retrograde ureter stent placement at this time.  I evaluated the patient for bilateral percutaneous nephrostomy tubes.  Nephrostomy tube placement is amendable based on the recent CT findings.  Patient has been off Xarelto for at least 1 day and he is a low bleeding risk.  I explained the procedure to the patient and his sister.  Explained the risks of bleeding and infection.  Explained that the nephrostomy tubes could be temporary but cannot exclude long-term or even chronic nephrostomy tubes.  Explained that the nephrostomy tubes may be exchanged for ureter stents in the future.  Informed consent was obtained from the patient's sister.  Plan for placement of bilateral percutaneous nephrostomy tubes today with moderate sedation.   Thank you for this interesting consult.  I greatly enjoyed meeting Griselda Tosh Whitham and look forward to participating in their care.  A copy of this report was sent to the requesting provider on this date.  Electronically Signed: Burman Riis, MD 09/22/2017, 2:16 PM   I spent a total of 20 Minutes    in face to face in clinical consultation, greater than 50% of which was counseling/coordinating care for acute kidney injury and bilateral hydronephrosis.

## 2017-09-22 NOTE — Consult Note (Signed)
PULMONARY / CRITICAL CARE MEDICINE   Name: Jordan [name] MRN: [id number] DOB: 07-28-1950    ADMISSION DATE:  09/21/2017   CONSULTATION DATE: 09/22/2017  REFERRING MD: Dr. Jodell Cipro  Reason: Acute renal failure and A. fib with RVR  HISTORY OF PRESENT ILLNESS:   This is a 67 year old Caucasian male with a medical history of type 1 diabetes, previous CVA, atrial fibrillation, colon cancer status post resection, and hyperthyroidism who was admitted with acute renal failure, acute encephalopathy, and hyperkalemia.  This morning, a rapid response was called for elevated heart rate in the 150s and 160s, tachypnea and worsening mental status.  He is being admitted to the ICU for further management.  His current creatinine level is 14.03 up from a baseline of 1.1 in April 2019.  He had a WBC of 32,000 upon admission.  Was given vancomycin and Zosyn in the ED but now on Rocephin.  He is afebrile but remains tachypneic and agitated.  Patient is confused and unable to provide much of a history. His renal ultrasound showed moderate bilateral hydronephrosis, and possible cystitis as a result of outflow obstruction.  His chest x-ray this morning shows diffuse bilateral perihilar infiltrates suggestive of pulmonary edema  PAST MEDICAL HISTORY :  He  has a past medical history of Atrial fibrillation ([place]), Cancer [facility]), Colon polyps, Dysrhythmia, Hypertension, Hyperthyroidism, Stroke ([place]) (03/2012), and Type 1 diabetes ([place]).  PAST SURGICAL HISTORY: He  has a past surgical history that includes Cardioversion (N/A, 03/25/2014); Colonoscopy; Hemicolectomy (Right); and Colonoscopy with propofol (N/A, 07/24/2017).  Allergies  Allergen Reactions  . Amiodarone     No current facility-administered medications on file prior to encounter.    Current Outpatient Medications on File Prior to Encounter  Medication Sig  . benazepril (LOTENSIN) 20 MG tablet Take 20 mg by mouth daily.  . folic acid (FOLVITE) 1 MG  tablet Take 1 mg by mouth daily.  . insulin glargine (LANTUS) 100 UNIT/ML injection 12 units in the AM, 10 units at bedtime. (Patient taking differently: Inject 28 Units into the skin daily. )  . insulin lispro (HUMALOG) 100 UNIT/ML injection   . Multiple Vitamin (MULTIVITAMIN) capsule Take 1 capsule by mouth daily.  . propranolol (INDERAL) 80 MG tablet Take 1 tablet (80 mg total) by mouth 3 (three) times daily.  . rivaroxaban (XARELTO) 20 MG TABS tablet Take 20 mg by mouth daily with supper.  . rosuvastatin (CRESTOR) 5 MG tablet Take 5 mg by mouth daily.  . sotalol (BETAPACE) 80 MG tablet Take 40 mg by mouth 2 (two) times daily.     FAMILY HISTORY:  His family history includes Heart attack in his mother.  SOCIAL HISTORY: He  reports that he quit smoking about 44 years ago. He has never used smokeless tobacco. He reports that he does not drink alcohol or use drugs.  REVIEW OF SYSTEMS:   Unable to obtain as patient is confused  SUBJECTIVE:    VITAL SIGNS: BP 100/68   Pulse (!) 158   Temp (!) 97.5 F (36.4 C)   Resp (!) 32   Wt 82.1 kg   SpO2 94%   BMI 26.73 kg/m   HEMODYNAMICS:    VENTILATOR SETTINGS:    INTAKE / OUTPUT: I/O last 3 completed shifts: In: 4030 [I.V.:3293.5; IV Piggyback:736.5] Out: -   PHYSICAL EXAMINATION: General: Acutely ill looking Neuro: Awake, follows some basic commands, speech is garbled, moves all extremities HEENT: PERRLA, moderate JVD, trachea midline Cardiovascular: Apical pulse irregular, tachycardic with  a rate in the 150s, S1-S2, no murmur regurg or gallop, +2 pulses bilaterally, +2 edema bilaterally Lungs: Diffuse rhonchi and crackles in all lung fields, increased work of breathing, no wheezing Abdomen: Nondistended, normal bowel sounds in all 4 quadrants, palpation reveals diffuse pain Musculoskeletal: Positive range of motion, no overt joint deformities Skin: Warm and dry, with multiple small bruises in bilateral lower  extremities  LABS:  BMET Recent Labs  Lab 09/21/17 1342 09/21/17 2235  NA 136 137  K 6.1* 5.2*  CL 102 108  CO2 <7* 10*  BUN 121* 132*  CREATININE 14.94* 14.03*  GLUCOSE 361* 256*    Electrolytes Recent Labs  Lab 09/21/17 1342 09/21/17 2235  CALCIUM 8.9 8.1*    CBC Recent Labs  Lab 09/21/17 1342 09/22/17 0415  WBC 32.3* 25.4*  HGB 10.2* 9.5*  HCT 30.8* 27.8*  PLT 538* 412    Coag's Recent Labs  Lab 09/21/17 1553  APTT 31  INR 1.41    Sepsis Markers Recent Labs  Lab 09/21/17 1357  LATICACIDVEN 1.8    ABG No results for input(s): PHART, PCO2ART, PO2ART in the last 168 hours.  Liver Enzymes Recent Labs  Lab 09/21/17 1342  AST 16  ALT 18  ALKPHOS 92  BILITOT 2.3*  ALBUMIN 3.2*    Cardiac Enzymes Recent Labs  Lab 09/21/17 1342  TROPONINI <0.03    Glucose Recent Labs  Lab 09/21/17 1343 09/21/17 1637 09/21/17 2131 09/22/17 0415  GLUCAP 344* 289* 214* 205*    Imaging Dg Chest 1 View  Result Date: 09/21/2017 CLINICAL DATA:  Altered mental status. EXAM: CHEST  1 VIEW COMPARISON:  03/19/2014 FINDINGS: Lungs are adequately inflated and otherwise clear. Cardiomediastinal silhouette and remainder the exam is unchanged. IMPRESSION: No active disease. Electronically Signed   By: Marin Olp M.D.   On: 09/21/2017 14:54   Ct Head Wo Contrast  Result Date: 09/21/2017 CLINICAL DATA:  Altered mental status.  Previous CVA EXAM: CT HEAD WITHOUT CONTRAST TECHNIQUE: Contiguous axial images were obtained from the base of the skull through the vertex without intravenous contrast. COMPARISON:  Jun 10, 2012 FINDINGS: Brain: There is mild diffuse atrophy. There is a degree of ex vacuo phenomenon involving the anterior horn of the left lateral ventricle. There is no intracranial mass, hemorrhage, extra-axial fluid collection, or midline shift. There is evidence of a prior infarct involving portions of the superior, anterior left temporal lobe as well as  much of the anterior left basal ganglia. Elsewhere there is mild small vessel disease in the centra semiovale bilaterally. No acute infarct is demonstrated on this study. Vascular: There is no evident hyperdense vessel. There is no appreciable vascular calcification. Skull: The bony calvarium  appears intact. Sinuses/Orbits: There is mucosal thickening in several ethmoid air cells. Other visualized paranasal sinuses are clear. Orbits appear symmetric bilaterally. Other: Mastoid air cells are clear. IMPRESSION: Mild atrophy. Prior infarct involving much of the left basal ganglia, particularly anteriorly, as well as a portion of the superior left temporal lobe anteriorly. Elsewhere there is mild periventricular small vessel disease. No acute infarct is evident. No mass or hemorrhage. There is mucosal thickening in several ethmoid air cells. Electronically Signed   By: Lowella Grip III M.D.   On: 09/21/2017 14:17   US Renal  Result Date: 09/21/2017 CLINICAL DATA:  67 y/o M; acute kidney injury. History of colon cancer. EXAM: RENAL / URINARY TRACT ULTRASOUND COMPLETE COMPARISON:  None. FINDINGS: Right Kidney: Length: 12.0. Mildly increased cortical echogenicity.  Right kidney upper pole 1.5 cm and mid pole 1.8 cm simple cysts. Moderate hydronephrosis. Left Kidney: Length: 11.9. Mild increased cortical echogenicity. No mass. Moderate hydronephrosis. Bladder: Thickened bladder wall to 1.7 cm. IMPRESSION: 1. Moderate bilateral hydronephrosis. 2. Mild increased echogenicity of the renal cortices compatible with medical renal disease. 3. Bladder wall thickening may reflect cystitis or sequelae of chronic outflow obstruction and underdistention. Electronically Signed   By: Kristine Garbe M.D.   On: 09/21/2017 18:25   Dg Chest Port 1 View  Result Date: 09/22/2017 CLINICAL DATA:  Dyspnea and tachycardia. EXAM: PORTABLE CHEST 1 VIEW COMPARISON:  09/21/2017 FINDINGS: Heart size and pulmonary vascularity are  normal. Since previous study, there is interval development of diffuse nodular perihilar infiltration bilaterally with some peribronchial thickening. This likely represents bronchopneumonia although edema or atypical infectious process could also have this appearance. No blunting of costophrenic angles. No pneumothorax. Mediastinal contours appear intact. IMPRESSION: Interval development of diffuse bilateral nodular perihilar infiltrates with peribronchial thickening suggesting bronchopneumonia or edema. Electronically Signed   By: Lucienne Capers M.D.   On: 09/22/2017 04:29   STUDIES:  Last 2D echo was done February 2016 and showed EF of 50 to 55% Repeat echo pending CULTURES: Cultures x2 Urine culture ANTIBIOTICS: Ceftriaxone started 09/21/2017:  SIGNIFICANT EVENTS: 09/21/2017: Admitted with acute change in mental status and renal failure 09/22/2017: Transferred to the ICU for worsening mental status, A. fib with RVR, and acute renal failure  LINES/TUBES: Left femoral try dialysis catheter inserted 09/22/2017 Peripheral IVs Foley catheter  DISCUSSION: 67 year old male presenting with severe uremic encephalopathy, bilateral hydronephrosis, acute cystitis, acute renal failure and A. fib with rapid ventricular rate  ASSESSMENT Acute renal failure- baseline creatinine 1.1 in April 2019; now 14.03 Acute encephalopathy- due to a combination of factors including uremia, sepsis and severe acidosis; CT head negative Bilateral hydronephrosis Acute cystitis Atrial fibrillation with rapid ventricular rate Severe metabolic acidosis Type 1 diabetes mellitus Hypertension Hyperlipidemia Frequent falls on anticoagulation  PLAN Hemodynamic monitoring per ICU protocol Nephrology and urology consulted.  Urology recommends obtaining a CT abdomen and pelvis to rule out any obstruction in which case the patient will need to be transferred to [facility] for cystoscopy with stent placement Left femoral  hemodialysis catheter placed per Dr. Candiss Norse in anticipation for either continuous renal replacement therapy or intermittent hemodialysis Bicarb infusion at 125 mL's per hour plus bicarb pushes as needed Antibiotics as above Follow-up cultures Blood glucose monitoring with sliding scale insulin coverage Diltiazem infusion and titrated to keep heart rate less than 110 FAMILY  Lasix 80 mg IV x1 Strict I's and O's monitoring Sitter at bedside for safety DVT prophylaxis- SCDs GI prophylaxis-Pepcid  - Updates: Spoke with patient's sister Mrs. Fredderick Phenix at length regarding patient's overall health status and likely prognosis.  She indicated that for now they would like to proceed with full treatment without any limitations.  Patient remains a full code.  Jordan Carden S. [facility] ANP-BC Pulmonary and Critical Care Medicine [facility] Pager [phone] or [phone]  NB: This document was prepared using Dragon voice recognition software and may include unintentional dictation errors.   09/22/2017, 4:44 AM

## 2017-09-22 NOTE — Consult Note (Signed)
Subjective: CC: Bilateral hydronephrosis.   Hx: I was asked to see the patient in consultation by Dr. Jefferson Fuel for bilateral hydronephrosis with AKI and sepsis.   [name] was found by his family in the floor yesterday.  He had been feeling poorly for about 2 weeks.  He has had a prior stroke in 2014 and has right hemiplegia and an expressive aphasia and is unable to clearly answer questions, but per the family he has chronic frequency and urgency and may void 3-4x in a 1-2 hour period and he keeps a urinal by the bedside.   He has had no prior UTI's, stones or GU history.  He has been on Xarelto but there is no report of hematuria.   On admission his WBC was 32K and his Cr was 14.03 with a Potassium of 5.2.  Baseline Cr was 1.1 on 05/29/17.  The UA had 11-20 RBC's and 6-10 WBC but was nit -.   Urine culture is pending.   A renal US was done which demonstrated moderate bilateral hydro with a very thick bladder wall but a small PVR.   A CT was done which confirmed the hydro with ureteral dilation to the thickened bladder wall without stones but there was some increased density material in the ureters, left > right, and also a mass effect at the base of the bladder superior to the SV's, left > right that could be a neoplastic process and responsible for the obstruction. (It was read as hemorrhagic debris but appears more mass like to me).  The bladder was decompressed around the foley.  The prostate was normal in appearance with moderate enlargement.  He has a history of colon cancer with a prior hemicolectomy in 2014.  The last CEA in Epic was normal in 2/14.  He had a negative colonoscopy in 5/19.  The foley is draining clear urine indicating and incomplete obstruction.     ROS:  Review of Systems  Unable to perform ROS: Medical condition    Allergies  Allergen Reactions  . Amiodarone     Past Medical History:  Diagnosis Date  . Atrial fibrillation ([place])   . Cancer ([place])    a. 06/2011 b. s/p  right hemicolectomy/ colon CA  . Colon polyps   . Dysrhythmia   . Hypertension   . Hyperthyroidism    a. 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 Stroke ([place]) 03/2012   a. residual right sided weakness  . Type 1 diabetes ([place])    a. on insulin b. prior DKA     Past Surgical History:  Procedure Laterality Date  . CARDIOVERSION N/A 03/25/2014   Procedure: CARDIOVERSION;  Surgeon: Lelon Perla, MD;  Location: [facility] ENDOSCOPY;  Service: Cardiovascular;  Laterality: N/A;  . COLONOSCOPY    . COLONOSCOPY WITH PROPOFOL N/A 07/24/2017   Procedure: COLONOSCOPY WITH PROPOFOL;  Surgeon: Toledo, Benay Pike, MD;  Location: [facility] ENDOSCOPY;  Service: Gastroenterology;  Laterality: N/A;  . HEMICOLECTOMY Right     Social History   Socioeconomic History  . Marital status: Single    Spouse name: Not on file  . Number of children: Not on file  . Years of education: Not on file  . Highest education level: Not on file  Occupational History  . Not on file  Social Needs  . Financial resource strain: Not hard at all  . Food insecurity:    Worry: Never true    Inability: Never  true  . Transportation needs:    Medical: Yes    Non-medical: Yes  Tobacco Use  . Smoking status: Former Smoker    Last attempt to quit: 09/21/1973    Years since quitting: 44.0  . Smokeless tobacco: Never Used  Substance and Sexual Activity  . Alcohol use: No  . Drug use: No  . Sexual activity: Not Currently  Lifestyle  . Physical activity:    Days per week: Patient refused    Minutes per session: Patient refused  . Stress: Not on file  Relationships  . Social connections:    Talks on phone: Once a week    Gets together: Once a week    Attends religious service: 1 to 4 times per year    Active member of club or organization: No    Attends meetings of clubs or organizations: Never    Relationship status: Never married  . Intimate partner violence:    Fear of current or ex partner: Patient  refused    Emotionally abused: Patient refused    Physically abused: Patient refused    Forced sexual activity: Patient refused  Other Topics Concern  . Not on file  Social History Narrative  . Not on file    Family History  Problem Relation Age of Onset  . Heart attack Mother     Anti-infectives: Anti-infectives (From admission, onward)   Start     Dose/Rate Route Frequency Ordered Stop   09/22/17 0600  cefTRIAXone (ROCEPHIN) 1 g in sodium chloride 0.9 % 100 mL IVPB  Status:  Discontinued     1 g 200 mL/hr over 30 Minutes Intravenous Every 24 hours 09/21/17 1549 09/22/17 0354   09/22/17 0354  cefTRIAXone (ROCEPHIN) 2 g in sodium chloride 0.9 % 100 mL IVPB     2 g 200 mL/hr over 30 Minutes Intravenous Every 24 hours 09/22/17 0354     09/21/17 1430  vancomycin (VANCOCIN) IVPB 1000 mg/200 mL premix     1,000 mg 200 mL/hr over 60 Minutes Intravenous  Once 09/21/17 1429 09/21/17 1644   09/21/17 1430  piperacillin-tazobactam (ZOSYN) IVPB 3.375 g     3.375 g 100 mL/hr over 30 Minutes Intravenous  Once 09/21/17 1429 09/21/17 1543      Current Facility-Administered Medications  Medication Dose Route Frequency Provider Last Rate Last Dose  . acetaminophen (TYLENOL) tablet 650 mg  650 mg Oral Q6H PRN Loletha Grayer, MD       Or  . acetaminophen (TYLENOL) suppository 650 mg  650 mg Rectal Q6H PRN Wieting, Richard, MD      . cefTRIAXone (ROCEPHIN) 2 g in sodium chloride 0.9 % 100 mL IVPB  2 g Intravenous Q24H Arta Silence, MD   Stopped at 09/22/17 0509  . Chlorhexidine Gluconate Cloth 2 % PADS 6 each  6 each Topical Q0600 Murlean Iba, MD      . digoxin (LANOXIN) 0.25 MG/ML injection 0.25 mg  0.25 mg Intravenous Once Arta Silence, MD       Followed by  . digoxin (LANOXIN) 0.25 MG/ML injection 0.125 mg  0.125 mg Intravenous Once Arta Silence, MD      . diltiazem (CARDIZEM) 100 mg in dextrose 5 % 100 mL (1 mg/mL) infusion  5-15 mg/hr Intravenous Continuous  Tukov-Yual, Magdalene S, NP 15 mL/hr at 09/22/17 0628 15 mg/hr at 09/22/17 0628  . famotidine (PEPCID) IVPB 20 mg premix  20 mg Intravenous Q24H Tukov-Yual, Magdalene S, NP 100 mL/hr at 09/22/17 0836 20 mg  at 09/22/17 0836  . folic acid (FOLVITE) tablet 1 mg  1 mg Oral Daily Wieting, Richard, MD      . haloperidol lactate (HALDOL) injection 1 mg  1 mg Intravenous Q6H PRN Wieting, Richard, MD      . heparin injection 1,000-6,000 Units  1,000-6,000 Units Intracatheter PRN Tukov-Yual, Magdalene S, NP      . insulin aspart (novoLOG) injection 0-5 Units  0-5 Units Subcutaneous QHS Loletha Grayer, MD   2 Units at 09/21/17 2245  . insulin aspart (novoLOG) injection 0-9 Units  0-9 Units Subcutaneous TID WC Loletha Grayer, MD   5 Units at 09/22/17 0815  . insulin glargine (LANTUS) injection 8 Units  8 Units Subcutaneous QHS Loletha Grayer, MD   8 Units at 09/21/17 2245  . multivitamin with minerals tablet 1 tablet  1 tablet Oral Daily Wieting, Richard, MD      . propranolol (INDERAL) tablet 80 mg  80 mg Oral TID Loletha Grayer, MD   80 mg at 09/21/17 2244  . rosuvastatin (CRESTOR) tablet 5 mg  5 mg Oral q1800 Wieting, Richard, MD      . sodium bicarbonate 150 mEq in sterile water 1,000 mL infusion   Intravenous Continuous Loletha Grayer, MD 125 mL/hr at 09/22/17 0432    . tamsulosin (FLOMAX) capsule 0.4 mg  0.4 mg Oral QPC supper Loletha Grayer, MD   0.4 mg at 09/21/17 2244     Objective: Vital signs in last 24 hours: Temp:  [97.4 F (36.3 C)-98.3 F (36.8 C)] 98.1 F (36.7 C) (08/18 0900) Pulse Rate:  [72-160] 160 (08/18 0800) Resp:  [17-38] 28 (08/18 0800) BP: (83-146)/(54-88) 102/88 (08/18 0844) SpO2:  [76 %-100 %] 76 % (08/18 0700) Weight:  [82.1 kg] 82.1 kg (08/17 1405)  Intake/Output from previous day: 08/17 0701 - 08/18 0700 In: 4421.6 [I.V.:3685.1; IV Piggyback:736.5] Out: 50 [Urine:50] Intake/Output this shift: Total I/O In: 120 [P.O.:120] Out: -    Physical Exam   Constitutional:  WD,WN WM with right hemiplegia.    HENT:  Head: Normocephalic and atraumatic.  Neck: Normal range of motion. Neck supple. No thyromegaly present.  Cardiovascular:  IRR with tachycardia but no murmur.  No edema.   Pulmonary/Chest: Effort normal. No respiratory distress. He has rales.  Abdominal: Soft. Bowel sounds are normal. He exhibits no mass. There is tenderness (bilateral flank tenderness). No hernia.  Genitourinary:  Genitourinary Comments: Normal phallus with adequate meatus.  Foley indwelling. Scrotum, testes and epididymis normal.   Perineum has no lesions. Anus has mild stenosis.   Prostate is some what indistinct with some anterior rectal wall fullness but no obvious mass.  SV's not palpable.     Musculoskeletal: He exhibits deformity (with contractions of the right arm and hand). He exhibits no edema or tenderness.  Lymphadenopathy:    He has no cervical adenopathy.  Neurological: He is alert.  Expressive aphasia and right hemiplegia with spasticity  Skin: Skin is warm and dry.  Vitals reviewed.   Lab Results:  Recent Labs    09/21/17 1342 09/22/17 0415  WBC 32.3* 25.4*  HGB 10.2* 9.5*  HCT 30.8* 27.8*  PLT 538* 412   BMET Recent Labs    09/21/17 2235 09/22/17 0415  NA 137 136  K 5.2* 4.7  CL 108 107  CO2 10* 9*  GLUCOSE 256* 223*  BUN 132* 122*  CREATININE 14.03* 13.72*  CALCIUM 8.1* 8.2*   PT/INR Recent Labs    09/21/17 1553  LABPROT 17.1*  INR 1.41   ABG Recent Labs    09/22/17 0604  PHART 7.28*  HCO3 PENDING    Studies/Results: Ct Abdomen Pelvis Wo Contrast  Result Date: 09/22/2017 CLINICAL DATA:  Acute renal failure. EXAM: CT ABDOMEN AND PELVIS WITHOUT CONTRAST TECHNIQUE: Multidetector CT imaging of the abdomen and pelvis was performed following the standard protocol without IV contrast. COMPARISON:  06/28/2011 FINDINGS: Lower chest: Small bilateral pleural effusions with associated atelectasis. Calcified plaque  over the right coronary artery. Hepatobiliary: Gallbladder is contracted. Liver and biliary tree are normal. Pancreas: Normal. Spleen: Normal. Adrenals/Urinary Tract: Adrenal glands are normal. Kidneys are normal in size with symmetric bilateral mild hydroureteronephrosis. No renal stones. 1.4 cm cyst over the upper pole right kidney. Foley catheter is present within a decompressed bladder as there is increased density to the fluid within the bladder possibly representing hemorrhagic debris in may be partially responsible for the hydroureteronephrosis. Stomach/Bowel: Stomach and small bowel are within normal. Appendix is not visualized. Evidence of patient's previous partial right colectomy. Vascular/Lymphatic: Minimal calcified plaque over the abdominal aorta. No adenopathy. Reproductive: Within normal. Other: Left femoral venous catheter is present with tip over the left external iliac pain near the junction to the common iliac vein. There is intermediate density material over the left pelvis which crosses midline at and just above the level of the seminal vesicles. This likely represents hemorrhagic debris. This encompasses the distal left ureter and is also adjacent the distal right ureter lesser degree and may be partially responsible for the bilateral hydroureteronephrosis. Musculoskeletal: Degenerative change of the spine and hips. IMPRESSION: Bilateral symmetric mild hydroureteronephrosis. Foley catheter present within a collapsed bladder as there is increased density material within the bladder possibly hemorrhagic debris. There is also increased density material over the left pelvis with some crossing midline adjacent both distal ureters left worse than right likely hemorrhagic debris. These factors may be responsible for the hydroureteronephrosis. Small bilateral pleural effusions with associated bibasilar atelectasis. 1.4 cm right renal cyst. Previous partial right colectomy. Left femoral venous catheter  with tip over the external iliac vein near the junction to common iliac vein. Electronically Signed   By: Marin Olp M.D.   On: 09/22/2017 09:58   Dg Chest 1 View  Result Date: 09/21/2017 CLINICAL DATA:  Altered mental status. EXAM: CHEST  1 VIEW COMPARISON:  03/19/2014 FINDINGS: Lungs are adequately inflated and otherwise clear. Cardiomediastinal silhouette and remainder the exam is unchanged. IMPRESSION: No active disease. Electronically Signed   By: Marin Olp M.D.   On: 09/21/2017 14:54   Ct Head Wo Contrast  Result Date: 09/21/2017 CLINICAL DATA:  Altered mental status.  Previous CVA EXAM: CT HEAD WITHOUT CONTRAST TECHNIQUE: Contiguous axial images were obtained from the base of the skull through the vertex without intravenous contrast. COMPARISON:  Jun 10, 2012 FINDINGS: Brain: There is mild diffuse atrophy. There is a degree of ex vacuo phenomenon involving the anterior horn of the left lateral ventricle. There is no intracranial mass, hemorrhage, extra-axial fluid collection, or midline shift. There is evidence of a prior infarct involving portions of the superior, anterior left temporal lobe as well as much of the anterior left basal ganglia. Elsewhere there is mild small vessel disease in the centra semiovale bilaterally. No acute infarct is demonstrated on this study. Vascular: There is no evident hyperdense vessel. There is no appreciable vascular calcification. Skull: The bony calvarium  appears intact. Sinuses/Orbits: There is mucosal thickening in several ethmoid air cells. Other visualized paranasal sinuses are  clear. Orbits appear symmetric bilaterally. Other: Mastoid air cells are clear. IMPRESSION: Mild atrophy. Prior infarct involving much of the left basal ganglia, particularly anteriorly, as well as a portion of the superior left temporal lobe anteriorly. Elsewhere there is mild periventricular small vessel disease. No acute infarct is evident. No mass or hemorrhage. There is  mucosal thickening in several ethmoid air cells. Electronically Signed   By: Lowella Grip III M.D.   On: 09/21/2017 14:17   US Renal  Result Date: 09/21/2017 CLINICAL DATA:  67 y/o M; acute kidney injury. History of colon cancer. EXAM: RENAL / URINARY TRACT ULTRASOUND COMPLETE COMPARISON:  None. FINDINGS: Right Kidney: Length: 12.0. Mildly increased cortical echogenicity. Right kidney upper pole 1.5 cm and mid pole 1.8 cm simple cysts. Moderate hydronephrosis. Left Kidney: Length: 11.9. Mild increased cortical echogenicity. No mass. Moderate hydronephrosis. Bladder: Thickened bladder wall to 1.7 cm. IMPRESSION: 1. Moderate bilateral hydronephrosis. 2. Mild increased echogenicity of the renal cortices compatible with medical renal disease. 3. Bladder wall thickening may reflect cystitis or sequelae of chronic outflow obstruction and underdistention. Electronically Signed   By: Kristine Garbe M.D.   On: 09/21/2017 18:25   Dg Chest Port 1 View  Result Date: 09/22/2017 CLINICAL DATA:  Dyspnea and tachycardia. EXAM: PORTABLE CHEST 1 VIEW COMPARISON:  09/21/2017 FINDINGS: Heart size and pulmonary vascularity are normal. Since previous study, there is interval development of diffuse nodular perihilar infiltration bilaterally with some peribronchial thickening. This likely represents bronchopneumonia although edema or atypical infectious process could also have this appearance. No blunting of costophrenic angles. No pneumothorax. Mediastinal contours appear intact. IMPRESSION: Interval development of diffuse bilateral nodular perihilar infiltrates with peribronchial thickening suggesting bronchopneumonia or edema. Electronically Signed   By: Lucienne Capers M.D.   On: 09/22/2017 04:29   Case discussed with Dr. Jefferson Fuel and Dr. Candiss Norse.   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   There is a possible mass in the  pelvis, left > right and some high densitiy material in the distal ureters.   Pt is a poor candidate for anesthetic and bladder wall findings could impact ability to placed stents.  After a review of his condition with the intensivist, I think he will be best served by bilateral nephrostomy tube placement to provide source control for the sepsis and relief of obstruction which may be contributing to the AKI.  Pelvic mass, left > right.  With his history of Colon Cancer.  I will get a CEA with the CT findings suggestive of a pelvic mass.  Colonoscopy negative in 5/19. I will also order a PSA for completeness.   May need mass biopsy at some point contingent on patient's recovery.   Frequency and urgency.  He has symptoms suggestive of detrusor instability that could be related to his prior stroke or bladder outlet obstruction but his PVR was low on admission.     Last took Xarelto >2 days ago per family.    CC: Dr. Hermelinda Dellen and Dr. Benita Stabile.     Irine Seal 09/22/2017 [phone]

## 2017-09-22 NOTE — Consult Note (Addendum)
Date: 09/22/2017                  Patient Name:  Jordan [name]  MRN: [id number]  DOB: 03/22/1950  Age / Sex: 67 y.o., male         PCP: Albina Billet, MD                 Service Requesting Consult: IM/ Fritzi Mandes, MD                 Reason for Consult: ARF            History of Present Illness: Patient is a 67 y.o. male with medical problems of stroke in 2014 with residual right-sided weakness, history of colon cancer with partial colectomy, Aphasia, Chronic bladder wall thickening who was admitted to [facility] on 09/21/2017 for evaluation of Lethargy and weaknbess.  Patient's family reports that he has not been feeling well for the past 2 weeks.  Last 1 to 2 days he became worse and was not able to walk.  He was found on the floor at home and brought to the emergency room for evaluation.  Evaluation so far shows that patient was severely acidotic with pH of 7.28, PCO2 of 19.  Creatinine was elevated at 14.94, BUN 121.  Potassium elevated at 6.1.  Bicarbonate was less than 7.  Hemoglobin A1c 7.2%.  TSH normal.  Urinalysis shows blood and moderate leukocytes.  Ultrasound and CT of the abdomen shows bilateral hydronephrosis with chronic bladder wall thickening. Patient's baseline creatinine from April 2019 is 1.1 (care everywhere records)  Medications: Outpatient medications: Medications Prior to Admission  Medication Sig Dispense Refill Last Dose  . benazepril (LOTENSIN) 20 MG tablet Take 20 mg by mouth daily.   Past Week  . folic acid (FOLVITE) 1 MG tablet Take 1 mg by mouth daily.   Past Week at Unknown time  . insulin glargine (LANTUS) 100 UNIT/ML injection 12 units in the AM, 10 units at bedtime. (Patient taking differently: Inject 28 Units into the skin daily. ) 10 mL 11 07/24/2017 at Unknown time  . insulin lispro (HUMALOG) 100 UNIT/ML injection      . Multiple Vitamin (MULTIVITAMIN) capsule Take 1 capsule by mouth daily.   Past Week at Unknown time  . propranolol (INDERAL) 80 MG  tablet Take 1 tablet (80 mg total) by mouth 3 (three) times daily. 90 tablet 6 Past Week  . rivaroxaban (XARELTO) 20 MG TABS tablet Take 20 mg by mouth daily with supper.   Past Week at Unknown time  . rosuvastatin (CRESTOR) 5 MG tablet Take 5 mg by mouth daily.   Past Week  . sotalol (BETAPACE) 80 MG tablet Take 40 mg by mouth 2 (two) times daily.    Past Week at Unknown time    Current medications: Current Facility-Administered Medications  Medication Dose Route Frequency Provider Last Rate Last Dose  . acetaminophen (TYLENOL) tablet 650 mg  650 mg Oral Q6H PRN Loletha Grayer, MD       Or  . acetaminophen (TYLENOL) suppository 650 mg  650 mg Rectal Q6H PRN Wieting, Richard, MD      . cefTRIAXone (ROCEPHIN) 2 g in sodium chloride 0.9 % 100 mL IVPB  2 g Intravenous Q24H Arta Silence, MD   Stopped at 09/22/17 0509  . digoxin (LANOXIN) 0.25 MG/ML injection 0.25 mg  0.25 mg Intravenous Once Arta Silence, MD       Followed by  .  digoxin (LANOXIN) 0.25 MG/ML injection 0.125 mg  0.125 mg Intravenous Once Arta Silence, MD      . diltiazem (CARDIZEM) 100 mg in dextrose 5 % 100 mL (1 mg/mL) infusion  5-15 mg/hr Intravenous Continuous Tukov-Yual, Magdalene S, NP 15 mL/hr at 09/22/17 0628 15 mg/hr at 09/22/17 0628  . famotidine (PEPCID) IVPB 20 mg premix  20 mg Intravenous Q24H Tukov-Yual, Magdalene S, NP 100 mL/hr at 09/22/17 0836 20 mg at 09/22/17 0836  . folic acid (FOLVITE) tablet 1 mg  1 mg Oral Daily Wieting, Richard, MD      . haloperidol lactate (HALDOL) injection 1 mg  1 mg Intravenous Q6H PRN Wieting, Richard, MD      . heparin injection 1,000-6,000 Units  1,000-6,000 Units Intracatheter PRN Tukov-Yual, Magdalene S, NP      . insulin aspart (novoLOG) injection 0-5 Units  0-5 Units Subcutaneous QHS Loletha Grayer, MD   2 Units at 09/21/17 2245  . insulin aspart (novoLOG) injection 0-9 Units  0-9 Units Subcutaneous TID WC Loletha Grayer, MD   5 Units at 09/22/17 0815  .  insulin glargine (LANTUS) injection 8 Units  8 Units Subcutaneous QHS Loletha Grayer, MD   8 Units at 09/21/17 2245  . multivitamin with minerals tablet 1 tablet  1 tablet Oral Daily Wieting, Richard, MD      . propranolol (INDERAL) tablet 80 mg  80 mg Oral TID Loletha Grayer, MD   80 mg at 09/21/17 2244  . rosuvastatin (CRESTOR) tablet 5 mg  5 mg Oral q1800 Wieting, Richard, MD      . sodium bicarbonate 150 mEq in sterile water 1,000 mL infusion   Intravenous Continuous Loletha Grayer, MD 125 mL/hr at 09/22/17 0432    . tamsulosin (FLOMAX) capsule 0.4 mg  0.4 mg Oral QPC supper Loletha Grayer, MD   0.4 mg at 09/21/17 2244      Allergies: Allergies  Allergen Reactions  . Amiodarone       Past Medical History: Past Medical History:  Diagnosis Date  . Atrial fibrillation ([place])   . Cancer ([place])    a. 06/2011 b. s/p right hemicolectomy/ colon CA  . Colon polyps   . Dysrhythmia   . Hypertension   . Hyperthyroidism    a. 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 Stroke ([place]) 03/2012   a. residual right sided weakness  . Type 1 diabetes ([place])    a. on insulin b. prior DKA      Past Surgical History: Past Surgical History:  Procedure Laterality Date  . CARDIOVERSION N/A 03/25/2014   Procedure: CARDIOVERSION;  Surgeon: Lelon Perla, MD;  [facility] END[facility]COPY;  Service: Cardiovascular;  Laterality: N/A;  . COLONOSCOPY    . COLONOSCOPY WITH PROPOFOL N/A 07/24/2017   Procedure: COLONOSCOPY WITH PROPOFOL;  Surgeon: Toledo, Benay Pike, MD;  Location: [facility] ENDOSCOPY;  Service: Gastroenterology;  Laterality: N/A;  . HEMICOLECTOMY Right      Family History: Family History  Problem Relation Age of Onset  . Heart attack Mother      Social History: Social History   Socioeconomic History  . Marital status: Single    Spouse name: Not on file  . Number of children: Not on file  . Years of education: Not on file  . Highest education level: Not on file   Occupational History  . Not on file  Social Needs  . Financial resource strain: Not hard at all  .  Food insecurity:    Worry: Never true    Inability: Never true  . Transportation needs:    Medical: Yes    Non-medical: Yes  Tobacco Use  . Smoking status: Former Smoker    Last attempt to quit: 09/21/1973    Years since quitting: 44.0  . Smokeless tobacco: Never Used  Substance and Sexual Activity  . Alcohol use: No  . Drug use: No  . Sexual activity: Not Currently  Lifestyle  . Physical activity:    Days per week: Patient refused    Minutes per session: Patient refused  . Stress: Not on file  Relationships  . Social connections:    Talks on phone: Once a week    Gets together: Once a week    Attends religious service: 1 to 4 times per year    Active member of club or organization: No    Attends meetings of clubs or organizations: Never    Relationship status: Never married  . Intimate partner violence:    Fear of current or ex partner: Patient refused    Emotionally abused: Patient refused    Physically abused: Patient refused    Forced sexual activity: Patient refused  Other Topics Concern  . Not on file  Social History Narrative  . Not on file     Review of Systems: Not available due to patient's aphasia Gen:  HEENT:  CV:  Resp:  GI: GU :  MS:  Derm:   Psych: Heme:  Neuro:  Endocrine  Vital Signs: Blood pressure 102/88, pulse (!) 160, temperature 98.1 F (36.7 C), temperature source Axillary, resp. rate (!) 28, weight 82.1 kg, SpO2 (!) 76 %.   Intake/Output Summary (Last 24 hours) at 09/22/2017 1004 Last data filed at 09/22/2017 2979 Gross per 24 hour  Intake 4541.62 ml  Output 50 ml  Net 4491.62 ml    Weight trends: [employer]   09/21/17 1405  Weight: 82.1 kg    Physical Exam: General:  Chronically ill-appearing, laying in the bed  HEENT  dry oral mucous membranes  Neck:  Supple, no masses   Lungs:  coarse breath sounds Bilaterally   Heart::  Irregular, tachycardic, atrial fibrillation  Abdomen:  Soft, nontender  Extremities:  No peripheral edema  Neurologic:  Alert, able to follow few simple commands  Skin:  No acute rashes, normal turgor  Access:  Left femoral temporary dialysis catheter  Foley:  In place with clear yellow urine       Lab results: Basic Metabolic Panel: Recent Labs  Lab 09/21/17 1342 09/21/17 2235 09/22/17 0415  NA 136 137 136  K 6.1* 5.2* 4.7  CL 102 108 107  CO2 <7* 10* 9*  GLUCOSE 361* 256* 223*  BUN 121* 132* 122*  CREATININE 14.94* 14.03* 13.72*  CALCIUM 8.9 8.1* 8.2*  MG  --   --  2.5*  PHOS  --   --  8.7*    Liver Function Tests: Recent Labs  Lab 09/22/17 0415  AST 12*  ALT 15  ALKPHOS 70  BILITOT 1.7*  PROT 6.1*  ALBUMIN 2.6*   No results for input(s): LIPASE, AMYLASE in the last 168 hours. No results for input(s): AMMONIA in the last 168 hours.  CBC: Recent Labs  Lab 09/21/17 1342 09/22/17 0415  WBC 32.3* 25.4*  NEUTROABS 30.4* 23.0*  HGB 10.2* 9.5*  HCT 30.8* 27.8*  MCV 89.8 87.6  PLT 538* 412    Cardiac Enzymes: Recent Labs  Lab 09/21/17  1342 09/22/17 0415  CKTOTAL 129  --   TROPONINI <0.03 0.06*    BNP: Invalid input(s): POCBNP  CBG: Recent Labs  Lab 09/21/17 1343 09/21/17 1637 09/21/17 2131 09/22/17 0415 09/22/17 0730  GLUCAP 344* 289* 214* 205* 267*    Microbiology: Recent Results (from the past 720 hour(s))  Blood culture (routine x 2)     Status: None (Preliminary result)   Collection Time: 09/21/17  2:50 PM  Result Value Ref Range Status   Specimen Description BLOOD Blood Culture adequate volume  Final   Special Requests   Final    BOTTLES DRAWN AEROBIC AND ANAEROBIC BLOOD RIGHT ARM   Culture   Final    NO GROWTH < 24 HOURS Performed at [facility], [address] [place], [place] [address]    Report Status PENDING  Incomplete  Blood culture (routine x 2)     Status: None (Preliminary result)   Collection  Time: 09/21/17  2:50 PM  Result Value Ref Range Status   Specimen Description BLOOD BLOOD LEFT HAND  Final   Special Requests BOTTLES DRAWN AEROBIC AND ANAEROBIC  Final   Culture   Final    NO GROWTH < 24 HOURS Performed at [facility], [address], [place], [place] [address]    Report Status PENDING  Incomplete  MRSA PCR Screening     Status: None   Collection Time: 09/22/17  7:22 AM  Result Value Ref Range Status   MRSA by PCR NEGATIVE NEGATIVE Final    Comment:        The GeneXpert MRSA Assay (FDA approved for NASAL specimens only), is one component of a comprehensive MRSA colonization surveillance program. It is not intended to diagnose MRSA infection nor to guide or monitor treatment for MRSA infections. Performed at [facility], [address] [place], [place] [address]      Coagulation Studies: Recent Labs    09/21/17 1553  LABPROT 17.1*  INR 1.41    Urinalysis: Recent Labs    09/21/17 1342  COLORURINE YELLOW*  LABSPEC 1.008  PHURINE 5.0  GLUCOSEU >=500*  HGBUR MODERATE*  BILIRUBINUR NEGATIVE  KETONESUR 20*  PROTEINUR NEGATIVE  NITRITE NEGATIVE  LEUKOCYTESUR MODERATE*        Imaging: Ct Abdomen Pelvis Wo Contrast  Result Date: 09/22/2017 CLINICAL DATA:  Acute renal failure. EXAM: CT ABDOMEN AND PELVIS WITHOUT CONTRAST TECHNIQUE: Multidetector CT imaging of the abdomen and pelvis was performed following the standard protocol without IV contrast. COMPARISON:  06/28/2011 FINDINGS: Lower chest: Small bilateral pleural effusions with associated atelectasis. Calcified plaque over the right coronary artery. Hepatobiliary: Gallbladder is contracted. Liver and biliary tree are normal. Pancreas: Normal. Spleen: Normal. Adrenals/Urinary Tract: Adrenal glands are normal. Kidneys are normal in size with symmetric bilateral mild hydroureteronephrosis. No renal stones. 1.4 cm cyst over the upper pole right kidney. Foley catheter is present  within a decompressed bladder as there is increased density to the fluid within the bladder possibly representing hemorrhagic debris in may be partially responsible for the hydroureteronephrosis. Stomach/Bowel: Stomach and small bowel are within normal. Appendix is not visualized. Evidence of patient's previous partial right colectomy. Vascular/Lymphatic: Minimal calcified plaque over the abdominal aorta. No adenopathy. Reproductive: Within normal. Other: Left femoral venous catheter is present with tip over the left external iliac pain near the junction to the common iliac vein. There is intermediate density material over the left pelvis which crosses midline at and just above the level of the seminal  vesicles. This likely represents hemorrhagic debris. This encompasses the distal left ureter and is also adjacent the distal right ureter lesser degree and may be partially responsible for the bilateral hydroureteronephrosis. Musculoskeletal: Degenerative change of the spine and hips. IMPRESSION: Bilateral symmetric mild hydroureteronephrosis. Foley catheter present within a collapsed bladder as there is increased density material within the bladder possibly hemorrhagic debris. There is also increased density material over the left pelvis with some crossing midline adjacent both distal ureters left worse than right likely hemorrhagic debris. These factors may be responsible for the hydroureteronephrosis. Small bilateral pleural effusions with associated bibasilar atelectasis. 1.4 cm right renal cyst. Previous partial right colectomy. Left femoral venous catheter with tip over the external iliac vein near the junction to common iliac vein. Electronically Signed   By: Marin Olp M.D.   On: 09/22/2017 09:58   Dg Chest 1 View  Result Date: 09/21/2017 CLINICAL DATA:  Altered mental status. EXAM: CHEST  1 VIEW COMPARISON:  03/19/2014 FINDINGS: Lungs are adequately inflated and otherwise clear. Cardiomediastinal  silhouette and remainder the exam is unchanged. IMPRESSION: No active disease. Electronically Signed   By: Marin Olp M.D.   On: 09/21/2017 14:54   Ct Head Wo Contrast  Result Date: 09/21/2017 CLINICAL DATA:  Altered mental status.  Previous CVA EXAM: CT HEAD WITHOUT CONTRAST TECHNIQUE: Contiguous axial images were obtained from the base of the skull through the vertex without intravenous contrast. COMPARISON:  Jun 10, 2012 FINDINGS: Brain: There is mild diffuse atrophy. There is a degree of ex vacuo phenomenon involving the anterior horn of the left lateral ventricle. There is no intracranial mass, hemorrhage, extra-axial fluid collection, or midline shift. There is evidence of a prior infarct involving portions of the superior, anterior left temporal lobe as well as much of the anterior left basal ganglia. Elsewhere there is mild small vessel disease in the centra semiovale bilaterally. No acute infarct is demonstrated on this study. Vascular: There is no evident hyperdense vessel. There is no appreciable vascular calcification. Skull: The bony calvarium  appears intact. Sinuses/Orbits: There is mucosal thickening in several ethmoid air cells. Other visualized paranasal sinuses are clear. Orbits appear symmetric bilaterally. Other: Mastoid air cells are clear. IMPRESSION: Mild atrophy. Prior infarct involving much of the left basal ganglia, particularly anteriorly, as well as a portion of the superior left temporal lobe anteriorly. Elsewhere there is mild periventricular small vessel disease. No acute infarct is evident. No mass or hemorrhage. There is mucosal thickening in several ethmoid air cells. Electronically Signed   By: Lowella Grip III M.D.   On: 09/21/2017 14:17   US Renal  Result Date: 09/21/2017 CLINICAL DATA:  67 y/o M; acute kidney injury. History of colon cancer. EXAM: RENAL / URINARY TRACT ULTRASOUND COMPLETE COMPARISON:  None. FINDINGS: Right Kidney: Length: 12.0. Mildly increased  cortical echogenicity. Right kidney upper pole 1.5 cm and mid pole 1.8 cm simple cysts. Moderate hydronephrosis. Left Kidney: Length: 11.9. Mild increased cortical echogenicity. No mass. Moderate hydronephrosis. Bladder: Thickened bladder wall to 1.7 cm. IMPRESSION: 1. Moderate bilateral hydronephrosis. 2. Mild increased echogenicity of the renal cortices compatible with medical renal disease. 3. Bladder wall thickening may reflect cystitis or sequelae of chronic outflow obstruction and underdistention. Electronically Signed   By: Kristine Garbe M.D.   On: 09/21/2017 18:25   Dg Chest Port 1 View  Result Date: 09/22/2017 CLINICAL DATA:  Dyspnea and tachycardia. EXAM: PORTABLE CHEST 1 VIEW COMPARISON:  09/21/2017 FINDINGS: Heart size and pulmonary vascularity are  normal. Since previous study, there is interval development of diffuse nodular perihilar infiltration bilaterally with some peribronchial thickening. This likely represents bronchopneumonia although edema or atypical infectious process could also have this appearance. No blunting of costophrenic angles. No pneumothorax. Mediastinal contours appear intact. IMPRESSION: Interval development of diffuse bilateral nodular perihilar infiltrates with peribronchial thickening suggesting bronchopneumonia or edema. Electronically Signed   By: Lucienne Capers M.D.   On: 09/22/2017 04:29      Assessment & Plan: Pt is a 67 y.o. Caucasian  male with . medical problems of insulin-dependent diabetes, atrial fibrillation, stroke in 2014 with residual right-sided weakness and Aphasia, history of colon cancer with partial colectomy, Chronic bladder wall thickening who was admitted to [facility] on 09/21/2017 for evaluation of Lethargy and weaknbess.   Patient appears to have developed acute kidney injury from bilateral hydronephrosis and also underlying possible sepsis and dehydration.  Outpatient medications included benazepril.  1.  Acute kidney injury.   Baseline creatinine 1.1 from April 2019 Patient is oligo anuric with bilateral hydronephrosis.  Percutaneous nephrostomy tubes are being planned.  BUN and creatinine are critically elevated along with severe acidosis.  Discussed with family and critical care team.  Patient is a candidate for urgent hemodialysis.  Orders have been placed and nurse has been alerted.  Patient has a left femoral temporary dialysis catheter placed by ICU team. Case discussed with patient's sister and other family members who have consented to proceed.  2.  Bilateral hydronephrosis and chronic bladder wall thickening.  Patient has been evaluated by urologist Dr. Idamae Schuller.  Patient is poor candidate for general anesthesia therefore stent cannot be placed at this time.  Percutaneous nephrostomy tubes are being considered  3.  Severe acidosis From renal failure.  Plan for hemodialysis as above Continued on IV bicarbonate supplementation  4.  Hyperkalemia Improving with current treatment      LOS: [place] 8/18/201910:04 AM  [place], [place]  Note: This note was prepared with Dragon dictation. Any transcription errors are unintentional

## 2017-09-22 NOTE — Progress Notes (Signed)
Fredderick Phenix, sister notified pt being transferred to stepdown.

## 2017-09-22 NOTE — Consult Note (Signed)
Jordan [name] is [name] 67 y.o. male  [id number]  Primary Cardiologist: Quinnie Barcelo  Reason for Consultation: atrial fib with RVR  HPI: 67 YOWM came with with altered mental status and CVA with right sided weakeness. He had sinus rhythm on admission but now is in atrial fib with RVR.  Review of Systems: unable to get any history   Past Medical History:  Diagnosis Date  . Atrial fibrillation ([place])   . Cancer ([place])    [name]. 06/2011 b. s/p right hemicolectomy/ colon CA  . Colon polyps   . Dysrhythmia   . Hypertension   . Hyperthyroidism    [name]. 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 Stroke ([place]) 03/2012   [name]. residual right sided weakness  . Type 1 diabetes ([place])    [name]. on insulin b. prior DKA     Medications Prior to Admission  Medication Sig Dispense Refill  . benazepril (LOTENSIN) 20 MG tablet Take 20 mg by mouth daily.    . folic acid (FOLVITE) 1 MG tablet Take 1 mg by mouth daily.    . insulin glargine (LANTUS) 100 UNIT/ML injection 12 units in the AM, 10 units at bedtime. (Patient taking differently: Inject 28 Units into the skin daily. ) 10 mL 11  . insulin lispro (HUMALOG) 100 UNIT/ML injection     . Multiple Vitamin (MULTIVITAMIN) capsule Take 1 capsule by mouth daily.    . propranolol (INDERAL) 80 MG tablet Take 1 tablet (80 mg total) by mouth 3 (three) times daily. 90 tablet 6  . rivaroxaban (XARELTO) 20 MG TABS tablet Take 20 mg by mouth daily with supper.    . rosuvastatin (CRESTOR) 5 MG tablet Take 5 mg by mouth daily.    . sotalol (BETAPACE) 80 MG tablet Take 40 mg by mouth 2 (two) times daily.        . Chlorhexidine Gluconate Cloth  6 each Topical Q0600  . digoxin  0.25 mg Intravenous Once   Followed by  . digoxin  0.125 mg Intravenous Once  . folic acid  1 mg Oral Daily  . insulin aspart  0-5 Units Subcutaneous QHS  . insulin aspart  0-9 Units Subcutaneous TID WC  . insulin glargine  8 Units Subcutaneous QHS  . multivitamin with minerals   1 tablet Oral Daily  . propranolol  80 mg Oral TID  . rosuvastatin  5 mg Oral q1800  . tamsulosin  0.4 mg Oral QPC supper    Infusions: . cefTRIAXone (ROCEPHIN)  IV Stopped (09/22/17 0509)  . diltiazem (CARDIZEM) infusion 15 mg/hr (09/22/17 0628)  . famotidine (PEPCID) IV 20 mg (09/22/17 0836)  .  sodium bicarbonate (isotonic) infusion in sterile water 125 mL/hr at 09/22/17 0432    Allergies  Allergen Reactions  . Amiodarone     Social History   Socioeconomic History  . Marital status: Single    Spouse name: Not on file  . Number of children: Not on file  . Years of education: Not on file  . Highest education level: Not on file  Occupational History  . Not on file  Social Needs  . Financial resource strain: Not hard at all  . Food insecurity:    Worry: Never true    Inability: Never true  . Transportation needs:    Medical: Yes    Non-medical: Yes  Tobacco Use  . Smoking status: Former Smoker    Last attempt to quit:  09/21/1973    Years since quitting: 44.0  . Smokeless tobacco: Never Used  Substance and Sexual Activity  . Alcohol use: No  . Drug use: No  . Sexual activity: Not Currently  Lifestyle  . Physical activity:    Days per week: Patient refused    Minutes per session: Patient refused  . Stress: Not on file  Relationships  . Social connections:    Talks on phone: Once [name] week    Gets together: Once [name] week    Attends religious service: 1 to 4 times per year    Active member of club or organization: No    Attends meetings of clubs or organizations: Never    Relationship status: Never married  . Intimate partner violence:    Fear of current or ex partner: Patient refused    Emotionally abused: Patient refused    Physically abused: Patient refused    Forced sexual activity: Patient refused  Other Topics Concern  . Not on file  Social History Narrative  . Not on file    Family History  Problem Relation Age of Onset  . Heart attack Mother      PHYSICAL EXAM: Vitals:   09/22/17 0844 09/22/17 0900  BP: 102/88   Pulse:    Resp:    Temp:  98.1 F (36.7 C)  SpO2:       Intake/Output Summary (Last 24 hours) at 09/22/2017 1036 Last data filed at 09/22/2017 0842 Gross per 24 hour  Intake 4541.62 ml  Output 50 ml  Net 4491.62 ml    General:  Well appearing. No respiratory difficulty HEENT: normal Neck: supple. no JVD. Carotids 2+ bilat; no bruits. No lymphadenopathy or thryomegaly appreciated. Cor: PMI nondisplaced. Regular rate & rhythm. No rubs, gallops or murmurs. Lungs: clear Abdomen: soft, nontender, nondistended. No hepatosplenomegaly. No bruits or masses. Good bowel sounds. Extremities: no cyanosis, clubbing, rash, edema Neuro: alert & oriented x 3, cranial nerves grossly intact. moves all 4 extremities w/o difficulty. Affect pleasant.  ECG: Atrial fibrillation with RVR  Results for orders placed or performed during the hospital encounter of 09/21/17 (from the past 24 hour(s))  CBC with Differential     Status: Abnormal   Collection Time: 09/21/17  1:42 PM  Result Value Ref Range   WBC 32.3 (H) 3.8 - 10.6 K/uL   RBC 3.43 (L) 4.40 - 5.90 MIL/uL   Hemoglobin 10.2 (L) 13.0 - 18.0 g/dL   HCT 30.8 (L) 40.0 - 52.0 %   MCV 89.8 80.0 - 100.0 fL   MCH 29.7 26.0 - 34.0 pg   MCHC 33.1 32.0 - 36.0 g/dL   RDW 13.0 11.5 - 14.5 %   Platelets 538 (H) 150 - 440 K/uL   Neutrophils Relative % 94 %   Neutro Abs 30.4 (H) 1.4 - 6.5 K/uL   Lymphocytes Relative 2 %   Lymphs Abs 0.7 (L) 1.0 - 3.6 K/uL   Monocytes Relative 4 %   Monocytes Absolute 1.2 (H) 0.2 - 1.0 K/uL   Eosinophils Relative 0 %   Eosinophils Absolute 0.0 0 - 0.7 K/uL   Basophils Relative 0 %   Basophils Absolute 0.0 0 - 0.1 K/uL  Comprehensive metabolic panel     Status: Abnormal   Collection Time: 09/21/17  1:42 PM  Result Value Ref Range   Sodium 136 135 - 145 mmol/L   Potassium 6.1 (H) 3.5 - 5.1 mmol/L   Chloride 102 98 - 111 mmol/L   CO2 <7 (  L) 22  - 32 mmol/L   Glucose, Bld 361 (H) 70 - 99 mg/dL   BUN 121 (H) 8 - 23 mg/dL   Creatinine, Ser 14.94 (H) 0.61 - 1.24 mg/dL   Calcium 8.9 8.9 - 10.3 mg/dL   Total Protein 7.5 6.5 - 8.1 g/dL   Albumin 3.2 (L) 3.5 - 5.0 g/dL   AST 16 15 - 41 U/L   ALT 18 0 - 44 U/L   Alkaline Phosphatase 92 38 - 126 U/L   Total Bilirubin 2.3 (H) 0.3 - 1.2 mg/dL   GFR calc non Af Amer 3 (L) >60 mL/min   GFR calc Af Amer 3 (L) >60 mL/min   Anion gap NOT CALCULATED 5 - 15  Troponin I     Status: None   Collection Time: 09/21/17  1:42 PM  Result Value Ref Range   Troponin I <0.03 <0.03 ng/mL  Urinalysis, Complete w Microscopic     Status: Abnormal   Collection Time: 09/21/17  1:42 PM  Result Value Ref Range   Color, Urine YELLOW ([name]) YELLOW   APPearance HAZY ([name]) CLEAR   Specific Gravity, Urine 1.008 1.005 - 1.030   pH 5.0 5.0 - 8.0   Glucose, UA >=500 ([name]) NEGATIVE mg/dL   Hgb urine dipstick MODERATE ([name]) NEGATIVE   Bilirubin Urine NEGATIVE NEGATIVE   Ketones, ur 20 ([name]) NEGATIVE mg/dL   Protein, ur NEGATIVE NEGATIVE mg/dL   Nitrite NEGATIVE NEGATIVE   Leukocytes, UA MODERATE ([name]) NEGATIVE   RBC / HPF 11-20 0 - 5 RBC/hpf   WBC, UA 6-10 0 - 5 WBC/hpf   Bacteria, UA RARE ([name]) NONE SEEN   Squamous Epithelial / LPF NONE SEEN 0 - 5   Mucus PRESENT   CK     Status: None   Collection Time: 09/21/17  1:42 PM  Result Value Ref Range   Total CK 129 49 - 397 U/L  Glucose, capillary     Status: Abnormal   Collection Time: 09/21/17  1:43 PM  Result Value Ref Range   Glucose-Capillary 344 (H) 70 - 99 mg/dL  Ethanol     Status: None   Collection Time: 09/21/17  1:57 PM  Result Value Ref Range   Alcohol, Ethyl (B) <10 <10 mg/dL  Lactic acid, plasma     Status: None   Collection Time: 09/21/17  1:57 PM  Result Value Ref Range   Lactic Acid, Venous 1.8 0.5 - 1.9 mmol/L  Urine Drug Screen, Qualitative ([facility] only)     Status: Abnormal   Collection Time: 09/21/17  2:15 PM  Result Value Ref Range   Tricyclic, Ur  Screen NONE DETECTED NONE DETECTED   Amphetamines, Ur Screen NONE DETECTED NONE DETECTED   MDMA (Ecstasy)Ur Screen NONE DETECTED NONE DETECTED   Cocaine Metabolite,Ur [place] NONE DETECTED NONE DETECTED   Opiate, Ur Screen NONE DETECTED NONE DETECTED   Phencyclidine (PCP) Ur S NONE DETECTED NONE DETECTED   Cannabinoid 50 Ng, Ur [place] NONE DETECTED NONE DETECTED   Barbiturates, Ur Screen NONE DETECTED NONE DETECTED   Benzodiazepine, Ur Scrn TEST NOT PERFORMED, REAGENT NOT AVAILABLE ([name]) NONE DETECTED   Methadone Scn, Ur NONE DETECTED NONE DETECTED  Blood culture (routine x 2)     Status: None (Preliminary result)   Collection Time: 09/21/17  2:50 PM  Result Value Ref Range   Specimen Description BLOOD Blood Culture adequate volume    Special Requests      BOTTLES DRAWN AEROBIC AND ANAEROBIC BLOOD  RIGHT ARM   Culture      NO GROWTH < 24 HOURS Performed at [facility], [address] [place], [place] [address]    Report Status PENDING   Blood culture (routine x 2)     Status: None (Preliminary result)   Collection Time: 09/21/17  2:50 PM  Result Value Ref Range   Specimen Description BLOOD BLOOD LEFT HAND    Special Requests BOTTLES DRAWN AEROBIC AND ANAEROBIC    Culture      NO GROWTH < 24 HOURS Performed at [facility], [address] [place], [place] [address]    Report Status PENDING   APTT     Status: None   Collection Time: 09/21/17  3:53 PM  Result Value Ref Range   aPTT 31 24 - 36 seconds  Protime-INR     Status: Abnormal   Collection Time: 09/21/17  3:53 PM  Result Value Ref Range   Prothrombin Time 17.1 (H) 11.4 - 15.2 seconds   INR 1.41   Heparin level (unfractionated)     Status: Abnormal   Collection Time: 09/21/17  3:53 PM  Result Value Ref Range   Heparin Unfractionated <0.10 (L) 0.30 - 0.70 IU/mL  Glucose, capillary     Status: Abnormal   Collection Time: 09/21/17  4:37 PM  Result Value Ref Range   Glucose-Capillary 289 (H) 70 - 99 mg/dL    Comment 1 Notify RN   Glucose, capillary     Status: Abnormal   Collection Time: 09/21/17  9:31 PM  Result Value Ref Range   Glucose-Capillary 214 (H) 70 - 99 mg/dL  Hemoglobin A1c     Status: Abnormal   Collection Time: 09/21/17 10:35 PM  Result Value Ref Range   Hgb A1c MFr Bld 7.2 (H) 4.8 - 5.6 %   Mean Plasma Glucose 159.94 mg/dL  Basic metabolic panel     Status: Abnormal   Collection Time: 09/21/17 10:35 PM  Result Value Ref Range   Sodium 137 135 - 145 mmol/L   Potassium 5.2 (H) 3.5 - 5.1 mmol/L   Chloride 108 98 - 111 mmol/L   CO2 10 (L) 22 - 32 mmol/L   Glucose, Bld 256 (H) 70 - 99 mg/dL   BUN 132 (H) 8 - 23 mg/dL   Creatinine, Ser 14.03 (H) 0.61 - 1.24 mg/dL   Calcium 8.1 (L) 8.9 - 10.3 mg/dL   GFR calc non Af Amer 3 (L) >60 mL/min   GFR calc Af Amer 4 (L) >60 mL/min   Anion gap 19 (H) 5 - 15  CBC with Differential/Platelet     Status: Abnormal   Collection Time: 09/22/17  4:15 AM  Result Value Ref Range   WBC 25.4 (H) 3.8 - 10.6 K/uL   RBC 3.17 (L) 4.40 - 5.90 MIL/uL   Hemoglobin 9.5 (L) 13.0 - 18.0 g/dL   HCT 27.8 (L) 40.0 - 52.0 %   MCV 87.6 80.0 - 100.0 fL   MCH 30.1 26.0 - 34.0 pg   MCHC 34.4 32.0 - 36.0 g/dL   RDW 13.2 11.5 - 14.5 %   Platelets 412 150 - 440 K/uL   Neutrophils Relative % 91 %   Neutro Abs 23.0 (H) 1.4 - 6.5 K/uL   Lymphocytes Relative 4 %   Lymphs Abs 1.0 1.0 - 3.6 K/uL   Monocytes Relative 5 %   Monocytes Absolute 1.3 (H) 0.2 - 1.0 K/uL   Eosinophils Relative 0 %   Eosinophils  Absolute 0.0 0 - 0.7 K/uL   Basophils Relative 0 %   Basophils Absolute 0.0 0 - 0.1 K/uL  Lactic acid, plasma     Status: None   Collection Time: 09/22/17  4:15 AM  Result Value Ref Range   Lactic Acid, Venous 1.2 0.5 - 1.9 mmol/L  Troponin I     Status: Abnormal   Collection Time: 09/22/17  4:15 AM  Result Value Ref Range   Troponin I 0.06 (HH) <0.03 ng/mL  Brain natriuretic peptide     Status: Abnormal   Collection Time: 09/22/17  4:15 AM  Result Value  Ref Range   B Natriuretic Peptide 878.0 (H) 0.0 - 100.0 pg/mL  Glucose, capillary     Status: Abnormal   Collection Time: 09/22/17  4:15 AM  Result Value Ref Range   Glucose-Capillary 205 (H) 70 - 99 mg/dL  Comprehensive metabolic panel     Status: Abnormal   Collection Time: 09/22/17  4:15 AM  Result Value Ref Range   Sodium 136 135 - 145 mmol/L   Potassium 4.7 3.5 - 5.1 mmol/L   Chloride 107 98 - 111 mmol/L   CO2 9 (L) 22 - 32 mmol/L   Glucose, Bld 223 (H) 70 - 99 mg/dL   BUN 122 (H) 8 - 23 mg/dL   Creatinine, Ser 13.72 (H) 0.61 - 1.24 mg/dL   Calcium 8.2 (L) 8.9 - 10.3 mg/dL   Total Protein 6.1 (L) 6.5 - 8.1 g/dL   Albumin 2.6 (L) 3.5 - 5.0 g/dL   AST 12 (L) 15 - 41 U/L   ALT 15 0 - 44 U/L   Alkaline Phosphatase 70 38 - 126 U/L   Total Bilirubin 1.7 (H) 0.3 - 1.2 mg/dL   GFR calc non Af Amer 3 (L) >60 mL/min   GFR calc Af Amer 4 (L) >60 mL/min   Anion gap 20 (H) 5 - 15  Magnesium     Status: Abnormal   Collection Time: 09/22/17  4:15 AM  Result Value Ref Range   Magnesium 2.5 (H) 1.7 - 2.4 mg/dL  Phosphorus     Status: Abnormal   Collection Time: 09/22/17  4:15 AM  Result Value Ref Range   Phosphorus 8.7 (H) 2.5 - 4.6 mg/dL  TSH     Status: None   Collection Time: 09/22/17  4:22 AM  Result Value Ref Range   TSH 1.911 0.350 - 4.500 uIU/mL  Blood gas, arterial     Status: Abnormal (Preliminary result)   Collection Time: 09/22/17  6:04 AM  Result Value Ref Range   FIO2 0.28    pH, Arterial 7.28 (L) 7.350 - 7.450   pCO2 arterial 19 (LL) 32.0 - 48.0 mmHg   pO2, Arterial 76 (L) 83.0 - 108.0 mmHg   Bicarbonate PENDING 20.0 - 28.0 mmol/L   O2 Saturation PENDING %   Patient temperature 37.0    Collection site LEFT RADIAL    Sample type ARTERIAL DRAW    Allens test (pass/fail) PASS PASS  MRSA PCR Screening     Status: None   Collection Time: 09/22/17  7:22 AM  Result Value Ref Range   MRSA by PCR NEGATIVE NEGATIVE  Glucose, capillary     Status: Abnormal   Collection  Time: 09/22/17  7:30 AM  Result Value Ref Range   Glucose-Capillary 267 (H) 70 - 99 mg/dL   Ct Abdomen Pelvis Wo Contrast  Result Date: 09/22/2017 CLINICAL DATA:  Acute renal failure. EXAM: CT ABDOMEN  AND PELVIS WITHOUT CONTRAST TECHNIQUE: Multidetector CT imaging of the abdomen and pelvis was performed following the standard protocol without IV contrast. COMPARISON:  06/28/2011 FINDINGS: Lower chest: Small bilateral pleural effusions with associated atelectasis. Calcified plaque over the right coronary artery. Hepatobiliary: Gallbladder is contracted. Liver and biliary tree are normal. Pancreas: Normal. Spleen: Normal. Adrenals/Urinary Tract: Adrenal glands are normal. Kidneys are normal in size with symmetric bilateral mild hydroureteronephrosis. No renal stones. 1.4 cm cyst over the upper pole right kidney. Foley catheter is present within [name] decompressed bladder as there is increased density to the fluid within the bladder possibly representing hemorrhagic debris in may be partially responsible for the hydroureteronephrosis. Stomach/Bowel: Stomach and small bowel are within normal. Appendix is not visualized. Evidence of patient's previous partial right colectomy. Vascular/Lymphatic: Minimal calcified plaque over the abdominal aorta. No adenopathy. Reproductive: Within normal. Other: Left femoral venous catheter is present with tip over the left external iliac pain near the junction to the common iliac vein. There is intermediate density material over the left pelvis which crosses midline at and just above the level of the seminal vesicles. This likely represents hemorrhagic debris. This encompasses the distal left ureter and is also adjacent the distal right ureter lesser degree and may be partially responsible for the bilateral hydroureteronephrosis. Musculoskeletal: Degenerative change of the spine and hips. IMPRESSION: Bilateral symmetric mild hydroureteronephrosis. Foley catheter present within [name]  collapsed bladder as there is increased density material within the bladder possibly hemorrhagic debris. There is also increased density material over the left pelvis with some crossing midline adjacent both distal ureters left worse than right likely hemorrhagic debris. These factors may be responsible for the hydroureteronephrosis. Small bilateral pleural effusions with associated bibasilar atelectasis. 1.4 cm right renal cyst. Previous partial right colectomy. Left femoral venous catheter with tip over the external iliac vein near the junction to common iliac vein. Electronically Signed   By: Marin Olp M.D.   On: 09/22/2017 09:58   Dg Chest 1 View  Result Date: 09/21/2017 CLINICAL DATA:  Altered mental status. EXAM: CHEST  1 VIEW COMPARISON:  03/19/2014 FINDINGS: Lungs are adequately inflated and otherwise clear. Cardiomediastinal silhouette and remainder the exam is unchanged. IMPRESSION: No active disease. Electronically Signed   By: Marin Olp M.D.   On: 09/21/2017 14:54   Ct Head Wo Contrast  Result Date: 09/21/2017 CLINICAL DATA:  Altered mental status.  Previous CVA EXAM: CT HEAD WITHOUT CONTRAST TECHNIQUE: Contiguous axial images were obtained from the base of the skull through the vertex without intravenous contrast. COMPARISON:  Jun 10, 2012 FINDINGS: Brain: There is mild diffuse atrophy. There is [name] degree of ex vacuo phenomenon involving the anterior horn of the left lateral ventricle. There is no intracranial mass, hemorrhage, extra-axial fluid collection, or midline shift. There is evidence of [name] prior infarct involving portions of the superior, anterior left temporal lobe as well as much of the anterior left basal ganglia. Elsewhere there is mild small vessel disease in the centra semiovale bilaterally. No acute infarct is demonstrated on this study. Vascular: There is no evident hyperdense vessel. There is no appreciable vascular calcification. Skull: The bony calvarium  appears intact.  Sinuses/Orbits: There is mucosal thickening in several ethmoid air cells. Other visualized paranasal sinuses are clear. Orbits appear symmetric bilaterally. Other: Mastoid air cells are clear. IMPRESSION: Mild atrophy. Prior infarct involving much of the left basal ganglia, particularly anteriorly, as well as [name] portion of the superior left temporal lobe anteriorly. Elsewhere there is  mild periventricular small vessel disease. No acute infarct is evident. No mass or hemorrhage. There is mucosal thickening in several ethmoid air cells. Electronically Signed   By: Lowella Grip III M.D.   On: 09/21/2017 14:17   US Renal  Result Date: 09/21/2017 CLINICAL DATA:  67 y/o M; acute kidney injury. History of colon cancer. EXAM: RENAL / URINARY TRACT ULTRASOUND COMPLETE COMPARISON:  None. FINDINGS: Right Kidney: Length: 12.0. Mildly increased cortical echogenicity. Right kidney upper pole 1.5 cm and mid pole 1.8 cm simple cysts. Moderate hydronephrosis. Left Kidney: Length: 11.9. Mild increased cortical echogenicity. No mass. Moderate hydronephrosis. Bladder: Thickened bladder wall to 1.7 cm. IMPRESSION: 1. Moderate bilateral hydronephrosis. 2. Mild increased echogenicity of the renal cortices compatible with medical renal disease. 3. Bladder wall thickening may reflect cystitis or sequelae of chronic outflow obstruction and underdistention. Electronically Signed   By: Kristine Garbe M.D.   On: 09/21/2017 18:25   Dg Chest Port 1 View  Result Date: 09/22/2017 CLINICAL DATA:  Dyspnea and tachycardia. EXAM: PORTABLE CHEST 1 VIEW COMPARISON:  09/21/2017 FINDINGS: Heart size and pulmonary vascularity are normal. Since previous study, there is interval development of diffuse nodular perihilar infiltration bilaterally with some peribronchial thickening. This likely represents bronchopneumonia although edema or atypical infectious process could also have this appearance. No blunting of costophrenic angles. No  pneumothorax. Mediastinal contours appear intact. IMPRESSION: Interval development of diffuse bilateral nodular perihilar infiltrates with peribronchial thickening suggesting bronchopneumonia or edema. Electronically Signed   By: Lucienne Capers M.D.   On: 09/22/2017 04:29     ASSESSMENT AND PLAN: Atrial fibrillation with RVR, on IV cardizem. Has renal failure and may consider IV amiodrone to convert to NSR. Echo has moderate to severe LV  Systolic dysfunction. No thrombii seen.  Jordan [name]

## 2017-09-22 NOTE — Progress Notes (Signed)
Rapid response called.

## 2017-09-22 NOTE — Progress Notes (Signed)
Prime doc paged regaeding hr 150-160, bp 100/68, 20cc's urine output, orders given.

## 2017-09-22 NOTE — Procedures (Signed)
Hemodialysis Catheter Insertion Procedure Note Jordan [name] [id number] 1950-04-14  Procedure: Insertion of Central Venous Catheter Indications: hemodialysis  Procedure Details Consent: Risks of procedure as well as the alternatives and risks of each were explained to the (patient/caregiver).  Consent for procedure obtained. Time Out: Verified patient identification, verified procedure, site/side was marked, verified correct patient position, special equipment/implants available, medications/allergies/relevent history reviewed, required imaging and test results available.  Performed  Maximum sterile technique was used including antiseptics, cap, gloves, gown, hand hygiene, mask and sheet. Skin prep: Chlorhexidine; local anesthetic administered A antimicrobial bonded/coated triple lumen catheter was placed in the left femoral vein due to patient being a dialysis patient using the Seldinger technique.  Evaluation Blood flow good Complications: No apparent complications Patient did tolerate procedure well. Chest X-ray ordered to verify placement.  CXR: not indicated in femoral site.  Procedure performed under direct supervision of Dr.Conforti. Ultrasound utilized for realtime vessel cannulation  Jordan [name] S. [facility] ANP-BC Pulmonary and Critical Care Medicine [facility] Pager [phone] or [phone]  NB: This document was prepared using Dragon voice recognition software and may include unintentional dictation errors.   09/22/2017, 5:52 AM

## 2017-09-23 ENCOUNTER — Other Ambulatory Visit: Payer: Medicare Other

## 2017-09-23 ENCOUNTER — Inpatient Hospital Stay: Payer: Medicare Other

## 2017-09-23 DIAGNOSIS — N136 Pyonephrosis: Secondary | ICD-10-CM

## 2017-09-23 DIAGNOSIS — N1330 Unspecified hydronephrosis: Secondary | ICD-10-CM

## 2017-09-23 LAB — COMPREHENSIVE METABOLIC PANEL
ALT: 12 U/L (ref 0–44)
ANION GAP: 20 — AB (ref 5–15)
AST: 12 U/L — ABNORMAL LOW (ref 15–41)
Albumin: 2.3 g/dL — ABNORMAL LOW (ref 3.5–5.0)
Alkaline Phosphatase: 80 U/L (ref 38–126)
BUN: 80 mg/dL — ABNORMAL HIGH (ref 8–23)
CO2: 25 mmol/L (ref 22–32)
Calcium: 8.2 mg/dL — ABNORMAL LOW (ref 8.9–10.3)
Chloride: 101 mmol/L (ref 98–111)
Creatinine, Ser: 7.89 mg/dL — ABNORMAL HIGH (ref 0.61–1.24)
GFR calc non Af Amer: 6 mL/min — ABNORMAL LOW (ref >60)
GFR, EST AFRICAN AMERICAN: 7 mL/min — AB (ref >60)
Glucose, Bld: 227 mg/dL — ABNORMAL HIGH (ref 70–99)
POTASSIUM: 3.3 mmol/L — AB (ref 3.5–5.1)
Sodium: 146 mmol/L — ABNORMAL HIGH (ref 135–145)
Total Bilirubin: 1.7 mg/dL — ABNORMAL HIGH (ref 0.3–1.2)
Total Protein: 6 g/dL — ABNORMAL LOW (ref 6.5–8.1)

## 2017-09-23 LAB — GLUCOSE, CAPILLARY
GLUCOSE-CAPILLARY: 215 mg/dL — AB (ref 70–99)
GLUCOSE-CAPILLARY: 368 mg/dL — AB (ref 70–99)
Glucose-Capillary: 273 mg/dL — ABNORMAL HIGH (ref 70–99)
Glucose-Capillary: 281 mg/dL — ABNORMAL HIGH (ref 70–99)
Glucose-Capillary: 319 mg/dL — ABNORMAL HIGH (ref 70–99)

## 2017-09-23 LAB — BLOOD GAS, ARTERIAL
Acid-Base Excess: 3 mmol/L — ABNORMAL HIGH (ref 0.0–2.0)
Bicarbonate: 24.8 mmol/L (ref 20.0–28.0)
FIO2: 21
O2 Saturation: 95.6 %
Patient temperature: 37
pCO2 arterial: 29 mmHg — ABNORMAL LOW (ref 32.0–48.0)
pH, Arterial: 7.54 — ABNORMAL HIGH (ref 7.350–7.450)
pO2, Arterial: 69 mmHg — ABNORMAL LOW (ref 83.0–108.0)

## 2017-09-23 LAB — CBC
HCT: 27 % — ABNORMAL LOW (ref 40.0–52.0)
HEMOGLOBIN: 9.3 g/dL — AB (ref 13.0–18.0)
MCH: 29.5 pg (ref 26.0–34.0)
MCHC: 34.4 g/dL (ref 32.0–36.0)
MCV: 85.9 fL (ref 80.0–100.0)
Platelets: 363 10*3/uL (ref 150–440)
RBC: 3.15 MIL/uL — AB (ref 4.40–5.90)
RDW: 12.9 % (ref 11.5–14.5)
WBC: 13.4 10*3/uL — AB (ref 3.8–10.6)

## 2017-09-23 LAB — PROTIME-INR
INR: 1.25
PROTHROMBIN TIME: 15.6 s — AB (ref 11.4–15.2)

## 2017-09-23 LAB — HEPARIN LEVEL (UNFRACTIONATED)
Heparin Unfractionated: <0.1 IU/mL — ABNORMAL LOW (ref 0.30–0.70)
Heparin Unfractionated: <0.1 IU/mL — ABNORMAL LOW (ref 0.30–0.70)

## 2017-09-23 LAB — APTT: aPTT: 30 seconds (ref 24–36)

## 2017-09-23 MED ORDER — DILTIAZEM HCL 60 MG PO TABS
60.0000 mg | ORAL_TABLET | Freq: Three times a day (TID) | ORAL | Status: DC
  Administered 2017-09-23 – 2017-09-25 (×6): 60 mg via ORAL
  Filled 2017-09-23 (×2): qty 1
  Filled 2017-09-23: qty 2
  Filled 2017-09-23: qty 1
  Filled 2017-09-23: qty 2
  Filled 2017-09-23: qty 1

## 2017-09-23 MED ORDER — INSULIN GLARGINE 100 UNIT/ML ~~LOC~~ SOLN
20.0000 [IU] | Freq: Every day | SUBCUTANEOUS | Status: DC
  Administered 2017-09-23: 20 [IU] via SUBCUTANEOUS
  Filled 2017-09-23 (×3): qty 0.2

## 2017-09-23 MED ORDER — HEPARIN SODIUM (PORCINE) 5000 UNIT/ML IJ SOLN
5000.0000 [IU] | Freq: Three times a day (TID) | INTRAMUSCULAR | Status: DC
  Administered 2017-09-23 – 2017-10-01 (×21): 5000 [IU] via SUBCUTANEOUS
  Filled 2017-09-23 (×21): qty 1

## 2017-09-23 MED ORDER — HEPARIN (PORCINE) IN NACL 100-0.45 UNIT/ML-% IJ SOLN
1200.0000 [IU]/h | INTRAMUSCULAR | Status: DC
  Administered 2017-09-23: 1200 [IU]/h via INTRAVENOUS
  Filled 2017-09-23: qty 250

## 2017-09-23 MED ORDER — SODIUM CHLORIDE 0.9 % IV SOLN
INTRAVENOUS | Status: DC
  Administered 2017-09-23 (×2): via INTRAVENOUS

## 2017-09-23 MED ORDER — POTASSIUM CHLORIDE 10 MEQ/50ML IV SOLN
10.0000 meq | INTRAVENOUS | Status: AC
  Administered 2017-09-23 (×4): 10 meq via INTRAVENOUS
  Filled 2017-09-23 (×4): qty 50

## 2017-09-23 MED ORDER — HEPARIN BOLUS VIA INFUSION
4000.0000 [IU] | Freq: Once | INTRAVENOUS | Status: AC
  Administered 2017-09-23: 4000 [IU] via INTRAVENOUS
  Filled 2017-09-23: qty 4000

## 2017-09-23 NOTE — Progress Notes (Signed)
Inpatient Diabetes Program Recommendations  AACE/ADA: New Consensus Statement on Inpatient Glycemic Control (2019)  Target Ranges:  Prepandial:   less than 140 mg/dL      Peak postprandial:   less than 180 mg/dL (1-2 hours)      Critically ill patients:  140 - 180 mg/dL  Results for Jordan [name], Jordan [name] (MRN [id number]) as of 09/23/2017 08:38  Ref. Range 09/22/2017 07:30 09/22/2017 13:28 09/22/2017 17:12 09/22/2017 18:26 09/22/2017 21:47 09/22/2017 23:50 09/23/2017 04:18  Glucose-Capillary Latest Ref Range: 70 - 99 mg/dL 267 (H) 235 (H) 286 (H) 305 (H) 263 (H) 273 (H) 215 (H)   Results for Jordan [name], Jordan [name] (MRN [id number]) as of 09/23/2017 08:38  Ref. Range 09/21/2017 22:35  Hemoglobin A1C Latest Ref Range: 4.8 - 5.6 % 7.2 (H)   Review of Glycemic Control  Diabetes history: DM1 (makes no insulin; requires basal, meal coverage, and correction insulin) Outpatient Diabetes medications: Lantus 28 units daily, Humalog for correction and meal coverage (per office note by Dr. Gabriel Carina on 06/05/17 should be taking Humalog 10 units with breakfast, 8 units with lunch, 12 units with supper, 4 units with HS snack, and 1 unit drops glucose 50 mg/dl) Current orders for Inpatient glycemic control: Lantus 8 units QHS, Novolog 0-15 units Q6H  Inpatient Diabetes Program Recommendations:  Insulin - Basal: Please consider increasing Lantus to 20 units QHS. Correction (SSI): Please consider increasing frequency of CBGs and Novolog to Q4H if patient will remain NPO. HgbA1C: A1C 7.2% on 09/21/17 indicating an average glucose of 160 mg/dl over the past 2-3 months.  NOTE: Patient has DM1 (makes no insulin) and is followed by Dr. Gabriel Carina ([place]) and was last seen on 06/05/2017. Per office visit note on 06/05/17 by Dr. Gabriel Carina patient was instructed to "Adjust the Lantus to 28 units each morning. Adjust the Humalog to 10 units before breakfast, 8 units before lunch, and 12 units before supper. Adjust bedtime snack dose to 4 units. Add 1  unit per BG of 50 over 150, as directed."  Thanks, Barnie Alderman, RN, MSN, CDE Diabetes Coordinator Inpatient Diabetes Program [phone] (Team Pager from 8am to 5pm)

## 2017-09-23 NOTE — Progress Notes (Signed)
[facility]  ROUNDING NOTE   Subjective:  Patient seen at bedside in the critical care unit. Bilateral nephrostomies placed. Good urine output noted. Patient had urine output of 6 L over the preceding 24 hours. Creatinine has gone from 9.18 down to 7.89 today. Sister at bedside.   Objective:  Vital signs in last 24 hours:  Temp:  [96.9 F (36.1 C)-98.1 F (36.7 C)] 98 F (36.7 C) (08/19 0819) Pulse Rate:  [55-129] 57 (08/19 1000) Resp:  [15-31] 16 (08/19 1000) BP: (92-131)/(49-86) 112/49 (08/19 1000) SpO2:  [89 %-100 %] 96 % (08/19 1000)  Weight change:  Filed Weights   09/21/17 1405  Weight: 82.1 kg    Intake/Output: I/O last 3 completed shifts: In: 4765.2 [P.O.:220; I.V.:3650.2; Other:325; IV Piggyback:570] Out: 6125 [IHKVQ:2595]   Intake/Output this shift:  Total I/O In: 0  Out: 1225 [Urine:1225]  Physical Exam: General: No acute distress, laying in bed  Head: Normocephalic, atraumatic. Moist oral mucosal membranes  Eyes: Anicteric  Neck: Supple, trachea midline  Lungs:  Clear to auscultation, normal effort  Heart: S1S2 no rubs  Abdomen:  Soft, nontender, bowel sounds present  Extremities: No peripheral edema.  Neurologic: Lethargic but arousable  Skin: No lesions       Basic Metabolic Panel: Recent Labs  Lab 09/21/17 1342 09/21/17 2235 09/22/17 0415 09/22/17 2112 09/23/17 0425  NA 136 137 136 140 146*  K 6.1* 5.2* 4.7 3.4* 3.3*  CL 102 108 107 98 101  CO2 <7* 10* 9* 18* 25  GLUCOSE 361* 256* 223* 279* 227*  BUN 121* 132* 122* 94* 80*  CREATININE 14.94* 14.03* 13.72* 9.18* 7.89*  CALCIUM 8.9 8.1* 8.2* 8.1* 8.2*  MG  --   --  2.5* 2.2  --   PHOS  --   --  8.7* 6.7*  --     Liver Function Tests: Recent Labs  Lab 09/21/17 1342 09/22/17 0415 09/23/17 0425  AST 16 12* 12*  ALT 18 15 12   ALKPHOS 92 70 80  BILITOT 2.3* 1.7* 1.7*  PROT 7.5 6.1* 6.0*  ALBUMIN 3.2* 2.6* 2.3*   No results for input(s): LIPASE, AMYLASE in the  last 168 hours. No results for input(s): AMMONIA in the last 168 hours.  CBC: Recent Labs  Lab 09/21/17 1342 09/22/17 0415 09/23/17 0425  WBC 32.3* 25.4* 13.4*  NEUTROABS 30.4* 23.0*  --   HGB 10.2* 9.5* 9.3*  HCT 30.8* 27.8* 27.0*  MCV 89.8 87.6 85.9  PLT 538* 412 363    Cardiac Enzymes: Recent Labs  Lab 09/21/17 1342 09/22/17 0415 09/22/17 2112  CKTOTAL 129  --   --   TROPONINI <0.03 0.06* 0.09*    BNP: Invalid input(s): POCBNP  CBG: Recent Labs  Lab 09/22/17 1712 09/22/17 1826 09/22/17 2147 09/22/17 2350 09/23/17 0418  GLUCAP 286* 305* 263* 273* 215*    Microbiology: Results for orders placed or performed during the hospital encounter of 09/21/17  Urine culture     Status: None   Collection Time: 09/21/17  2:31 PM  Result Value Ref Range Status   Specimen Description   Final    URINE, RANDOM Performed at [facility], [address] [place], [place] [address]    Special Requests   Final    NONE Performed at [facility], [address], [place], [place] [address]    Culture   Final    NO GROWTH Performed at [facility], [place] [address] [place], [place]  [address]    Report Status 09/22/2017 FINAL  Final  Blood culture (routine x 2)     Status: None (Preliminary result)   Collection Time: 09/21/17  2:50 PM  Result Value Ref Range Status   Specimen Description BLOOD Blood Culture adequate volume  Final   Special Requests   Final    BOTTLES DRAWN AEROBIC AND ANAEROBIC BLOOD RIGHT ARM   Culture   Final    NO GROWTH 2 DAYS Performed at [facility], [address] [place], [place] [address]    Report Status PENDING  Incomplete  Blood culture (routine x 2)     Status: None (Preliminary result)   Collection Time: 09/21/17  2:50 PM  Result Value Ref Range Status   Specimen Description BLOOD BLOOD LEFT HAND  Final   Special Requests BOTTLES DRAWN AEROBIC AND ANAEROBIC  Final   Culture   Final    NO GROWTH 2  DAYS Performed at [facility], [address], [place], [place] [address]    Report Status PENDING  Incomplete  MRSA PCR Screening     Status: None   Collection Time: 09/22/17  7:22 AM  Result Value Ref Range Status   MRSA by PCR NEGATIVE NEGATIVE Final    Comment:        The GeneXpert MRSA Assay (FDA approved for NASAL specimens only), is one component of a comprehensive MRSA colonization surveillance program. It is not intended to diagnose MRSA infection nor to guide or monitor treatment for MRSA infections. Performed at [facility], [address] [place], [place] [address]     Coagulation Studies: Recent Labs    09/21/17 1553 09/22/17 1224  LABPROT 17.1* 15.1  INR 1.41 1.20    Urinalysis: Recent Labs    09/21/17 1342  COLORURINE YELLOW*  LABSPEC 1.008  PHURINE 5.0  GLUCOSEU >=500*  HGBUR MODERATE*  BILIRUBINUR NEGATIVE  KETONESUR 20*  PROTEINUR NEGATIVE  NITRITE NEGATIVE  LEUKOCYTESUR MODERATE*      Imaging: Ct Abdomen Pelvis Wo Contrast  Result Date: 09/22/2017 CLINICAL DATA:  Acute renal failure. EXAM: CT ABDOMEN AND PELVIS WITHOUT CONTRAST TECHNIQUE: Multidetector CT imaging of the abdomen and pelvis was performed following the standard protocol without IV contrast. COMPARISON:  06/28/2011 FINDINGS: Lower chest: Small bilateral pleural effusions with associated atelectasis. Calcified plaque over the right coronary artery. Hepatobiliary: Gallbladder is contracted. Liver and biliary tree are normal. Pancreas: Normal. Spleen: Normal. Adrenals/Urinary Tract: Adrenal glands are normal. Kidneys are normal in size with symmetric bilateral mild hydroureteronephrosis. No renal stones. 1.4 cm cyst over the upper pole right kidney. Foley catheter is present within a decompressed bladder as there is increased density to the fluid within the bladder possibly representing hemorrhagic debris in may be partially responsible for the  hydroureteronephrosis. Stomach/Bowel: Stomach and small bowel are within normal. Appendix is not visualized. Evidence of patient's previous partial right colectomy. Vascular/Lymphatic: Minimal calcified plaque over the abdominal aorta. No adenopathy. Reproductive: Within normal. Other: Left femoral venous catheter is present with tip over the left external iliac pain near the junction to the common iliac vein. There is intermediate density material over the left pelvis which crosses midline at and just above the level of the seminal vesicles. This likely represents hemorrhagic debris. This encompasses the distal left ureter and is also adjacent the distal right ureter lesser degree and may be partially responsible for the bilateral hydroureteronephrosis. Musculoskeletal: Degenerative change of the spine and hips. IMPRESSION: Bilateral symmetric mild hydroureteronephrosis. Foley  catheter present within a collapsed bladder as there is increased density material within the bladder possibly hemorrhagic debris. There is also increased density material over the left pelvis with some crossing midline adjacent both distal ureters left worse than right likely hemorrhagic debris. These factors may be responsible for the hydroureteronephrosis. Small bilateral pleural effusions with associated bibasilar atelectasis. 1.4 cm right renal cyst. Previous partial right colectomy. Left femoral venous catheter with tip over the external iliac vein near the junction to common iliac vein. Electronically Signed   By: Marin Olp M.D.   On: 09/22/2017 09:58   Dg Chest 1 View  Result Date: 09/21/2017 CLINICAL DATA:  Altered mental status. EXAM: CHEST  1 VIEW COMPARISON:  03/19/2014 FINDINGS: Lungs are adequately inflated and otherwise clear. Cardiomediastinal silhouette and remainder the exam is unchanged. IMPRESSION: No active disease. Electronically Signed   By: Marin Olp M.D.   On: 09/21/2017 14:54   Ct Head Wo  Contrast  Result Date: 09/21/2017 CLINICAL DATA:  Altered mental status.  Previous CVA EXAM: CT HEAD WITHOUT CONTRAST TECHNIQUE: Contiguous axial images were obtained from the base of the skull through the vertex without intravenous contrast. COMPARISON:  Jun 10, 2012 FINDINGS: Brain: There is mild diffuse atrophy. There is a degree of ex vacuo phenomenon involving the anterior horn of the left lateral ventricle. There is no intracranial mass, hemorrhage, extra-axial fluid collection, or midline shift. There is evidence of a prior infarct involving portions of the superior, anterior left temporal lobe as well as much of the anterior left basal ganglia. Elsewhere there is mild small vessel disease in the centra semiovale bilaterally. No acute infarct is demonstrated on this study. Vascular: There is no evident hyperdense vessel. There is no appreciable vascular calcification. Skull: The bony calvarium  appears intact. Sinuses/Orbits: There is mucosal thickening in several ethmoid air cells. Other visualized paranasal sinuses are clear. Orbits appear symmetric bilaterally. Other: Mastoid air cells are clear. IMPRESSION: Mild atrophy. Prior infarct involving much of the left basal ganglia, particularly anteriorly, as well as a portion of the superior left temporal lobe anteriorly. Elsewhere there is mild periventricular small vessel disease. No acute infarct is evident. No mass or hemorrhage. There is mucosal thickening in several ethmoid air cells. Electronically Signed   By: Lowella Grip III M.D.   On: 09/21/2017 14:17   US Renal  Result Date: 09/21/2017 CLINICAL DATA:  67 y/o M; acute kidney injury. History of colon cancer. EXAM: RENAL / URINARY TRACT ULTRASOUND COMPLETE COMPARISON:  None. FINDINGS: Right Kidney: Length: 12.0. Mildly increased cortical echogenicity. Right kidney upper pole 1.5 cm and mid pole 1.8 cm simple cysts. Moderate hydronephrosis. Left Kidney: Length: 11.9. Mild increased cortical  echogenicity. No mass. Moderate hydronephrosis. Bladder: Thickened bladder wall to 1.7 cm. IMPRESSION: 1. Moderate bilateral hydronephrosis. 2. Mild increased echogenicity of the renal cortices compatible with medical renal disease. 3. Bladder wall thickening may reflect cystitis or sequelae of chronic outflow obstruction and underdistention. Electronically Signed   By: Kristine Garbe M.D.   On: 09/21/2017 18:25   Dg Chest Port 1 View  Result Date: 09/23/2017 CLINICAL DATA:  Acute respiratory failure EXAM: PORTABLE CHEST 1 VIEW COMPARISON:  09/22/2017 FINDINGS: Cardiac shadow is stable. The lungs are mildly hyperinflated. Previously seen nodular pattern is again identified but significantly improved when compare with the prior exam. Continued follow-up is recommended. Degenerative changes of the thoracolumbar spine are noted. IMPRESSION: Persistent but improved nodular pattern when compared with the prior exam. Electronically Signed  By: Inez Catalina M.D.   On: 09/23/2017 08:27   Dg Chest Port 1 View  Result Date: 09/22/2017 CLINICAL DATA:  Dyspnea and tachycardia. EXAM: PORTABLE CHEST 1 VIEW COMPARISON:  09/21/2017 FINDINGS: Heart size and pulmonary vascularity are normal. Since previous study, there is interval development of diffuse nodular perihilar infiltration bilaterally with some peribronchial thickening. This likely represents bronchopneumonia although edema or atypical infectious process could also have this appearance. No blunting of costophrenic angles. No pneumothorax. Mediastinal contours appear intact. IMPRESSION: Interval development of diffuse bilateral nodular perihilar infiltrates with peribronchial thickening suggesting bronchopneumonia or edema. Electronically Signed   By: Lucienne Capers M.D.   On: 09/22/2017 04:29   Ir Nephrostomy Placement Left  Result Date: 09/22/2017 INDICATION: 73 year old with acute kidney injury, sepsis and bilateral hydronephrosis. Patient needs  decompression of both kidneys. EXAM: PLACEMENT OF BILATERAL NEPHROSTOMY TUBES WITH ULTRASOUND AND FLUOROSCOPIC GUIDANCE COMPARISON:  None. MEDICATIONS: Ciprofloxacin 400 mg; The antibiotic was administered in an appropriate time frame prior to skin puncture. ANESTHESIA/SEDATION: Fentanyl 50 mcg IV; Versed 2 mg IV Moderate Sedation Time:  40 minutes The patient was continuously monitored during the procedure by the interventional radiology nurse under my direct supervision. CONTRAST:  10 mL-administered into the collecting system(s) FLUOROSCOPY TIME:  Fluoroscopy Time: 3 minutes and 30 seconds, 25 mGy COMPLICATIONS: None immediate. PROCEDURE: Informed written consent was obtained from the patient's sister after a thorough discussion of the procedural risks, benefits and alternatives. All questions were addressed. Maximal Sterile Barrier Technique was utilized including caps, mask, sterile gowns, sterile gloves, sterile drape, hand hygiene and skin antiseptic. A timeout was performed prior to the initiation of the procedure. Patient was placed prone. Both flanks were prepped and draped in sterile fashion. Procedure was technically difficult due to tachypnea and patient restlessness. Left kidney was identified with ultrasound. The left flank was anesthetized with 1% lidocaine. 21 gauge needle directed into a lower pole calyx with ultrasound guidance. Small amount of contrast was injected to confirm placement in the renal collecting system. 0.018 wire was advanced into the renal collecting system and Accustick dilator set was placed. J wire was advanced down the ureter and the tract was dilated to accommodate a 10 [place] multipurpose drain. 10 French drain was reconstituted in the renal pelvis. Catheter was sutured to skin and attached to gravity bag. Attention was directed to the right kidney. Right kidney is more difficult to evaluate with ultrasound. Right flank was anesthetized with 1% lidocaine. 21 gauge needle was  directed into a dilated midpole calyx with ultrasound guidance. Needle position confirmed within the collecting system with ultrasound. A 0.018 wire advanced easily down the ureter. An Accustick dilator set was placed. J wire was advanced down the ureter and the Accustick dilator set was replaced with a 10 [place] dilator. Subsequently, a 36 French drain was advanced over the wire and reconstituted in the right renal pelvis. It is difficult to reconstitute the pigtail drain in the renal pelvis to the small size of the collecting system. Catheter was attached to a gravity bag and sutured to the skin. Fluoroscopic and ultrasound images were taken and saved for documentation. FINDINGS: Mild to moderate bilateral hydronephrosis. Nephrostomy tubes are positioned in the renal pelvis bilaterally. IMPRESSION: Successful placement of bilateral percutaneous nephrostomy tubes with ultrasound and fluoroscopic guidance. Electronically Signed   By: Markus Daft M.D.   On: 09/22/2017 15:43   Ir Nephrostomy Placement Right  Result Date: 09/22/2017 INDICATION: 66 year old with acute kidney injury, sepsis and bilateral  hydronephrosis. Patient needs decompression of both kidneys. EXAM: PLACEMENT OF BILATERAL NEPHROSTOMY TUBES WITH ULTRASOUND AND FLUOROSCOPIC GUIDANCE COMPARISON:  None. MEDICATIONS: Ciprofloxacin 400 mg; The antibiotic was administered in an appropriate time frame prior to skin puncture. ANESTHESIA/SEDATION: Fentanyl 50 mcg IV; Versed 2 mg IV Moderate Sedation Time:  40 minutes The patient was continuously monitored during the procedure by the interventional radiology nurse under my direct supervision. CONTRAST:  10 mL-administered into the collecting system(s) FLUOROSCOPY TIME:  Fluoroscopy Time: 3 minutes and 30 seconds, 25 mGy COMPLICATIONS: None immediate. PROCEDURE: Informed written consent was obtained from the patient's sister after a thorough discussion of the procedural risks, benefits and alternatives. All  questions were addressed. Maximal Sterile Barrier Technique was utilized including caps, mask, sterile gowns, sterile gloves, sterile drape, hand hygiene and skin antiseptic. A timeout was performed prior to the initiation of the procedure. Patient was placed prone. Both flanks were prepped and draped in sterile fashion. Procedure was technically difficult due to tachypnea and patient restlessness. Left kidney was identified with ultrasound. The left flank was anesthetized with 1% lidocaine. 21 gauge needle directed into a lower pole calyx with ultrasound guidance. Small amount of contrast was injected to confirm placement in the renal collecting system. 0.018 wire was advanced into the renal collecting system and Accustick dilator set was placed. J wire was advanced down the ureter and the tract was dilated to accommodate a 10 [place] multipurpose drain. 10 French drain was reconstituted in the renal pelvis. Catheter was sutured to skin and attached to gravity bag. Attention was directed to the right kidney. Right kidney is more difficult to evaluate with ultrasound. Right flank was anesthetized with 1% lidocaine. 21 gauge needle was directed into a dilated midpole calyx with ultrasound guidance. Needle position confirmed within the collecting system with ultrasound. A 0.018 wire advanced easily down the ureter. An Accustick dilator set was placed. J wire was advanced down the ureter and the Accustick dilator set was replaced with a 10 [place] dilator. Subsequently, a 2 French drain was advanced over the wire and reconstituted in the right renal pelvis. It is difficult to reconstitute the pigtail drain in the renal pelvis to the small size of the collecting system. Catheter was attached to a gravity bag and sutured to the skin. Fluoroscopic and ultrasound images were taken and saved for documentation. FINDINGS: Mild to moderate bilateral hydronephrosis. Nephrostomy tubes are positioned in the renal pelvis  bilaterally. IMPRESSION: Successful placement of bilateral percutaneous nephrostomy tubes with ultrasound and fluoroscopic guidance. Electronically Signed   By: Markus Daft M.D.   On: 09/22/2017 15:43     Medications:   . cefTRIAXone (ROCEPHIN)  IV Stopped (09/23/17 0446)  . diltiazem (CARDIZEM) infusion 5 mg/hr (09/23/17 0959)  . famotidine (PEPCID) IV Stopped (09/23/17 1013)  . potassium chloride Stopped (09/23/17 0905)  .  sodium bicarbonate (isotonic) infusion in sterile water 125 mL/hr at 09/23/17 0850   . Chlorhexidine Gluconate Cloth  6 each Topical Q0600  . folic acid  1 mg Oral Daily  . insulin aspart  0-15 Units Subcutaneous Q6H  . insulin glargine  8 Units Subcutaneous QHS  . multivitamin with minerals  1 tablet Oral Daily  . propranolol  80 mg Oral TID  . rosuvastatin  5 mg Oral q1800  . tamsulosin  0.4 mg Oral QPC supper   acetaminophen **OR** acetaminophen, haloperidol lactate, heparin  Assessment/ Plan:  67 y.o. male with medical problems of insulin-dependent diabetes, atrial fibrillation, stroke in 2014 with  residual right-sided weakness and Aphasia, history of colon cancer with partial colectomy, Chronic bladder wall thickening who was admitted to [facility] on 09/21/2017 for evaluation of Lethargy and weaknbess.   Patient appears to have developed acute kidney injury from bilateral hydronephrosis and also underlying possible sepsis and dehydration.  Outpatient medications included benazepril.  1.  Acute renal failure.  Baseline creatinine 1.1 from April 2019 -Patient did successfully undergo bilateral nephrostomy placement yesterday.  Urine output of 6 L thereafter.  Therefore no further indication for dialysis at the moment.  Creatinine is come down to 7.  Continue to monitor renal function trend daily.  2.  Bilateral hydronephrosis and chronic bladder wall thickening.  Bilateral nephrostomy tubes have been placed.  Patient making good urine.  Continue to monitor  creatinine trend as above.  3.  Severe acidosis Improved significantly.  Stop sodium bicarbonate drip and transition the patient did 0.9 normal saline.  4.    Hypokalemia.  Serum potassium was high initially but now down to 3.3.  Potassium repletion per ICU protocol.   LOS: 2 Bearett Porcaro 8/19/201910:27 AM

## 2017-09-23 NOTE — Progress Notes (Signed)
Pt with a large amount of drainage on gown and bed pad. Right nephrostomy tube was undone at the top. Reconnected tube and pt cleaned up. Will continue to monitor output and pt.

## 2017-09-23 NOTE — Consult Note (Signed)
ANTICOAGULATION CONSULT NOTE - Initial Consult  Pharmacy Consult for Heparin  Indication: PMH of  atrial fibrillation (Xarelto PTA)   Labs: Recent Labs    09/21/17 1342 09/21/17 1553  09/22/17 0415 09/22/17 1224 09/22/17 2112 09/23/17 0425  HGB 10.2*  --   --  9.5*  --   --  9.3*  HCT 30.8*  --   --  27.8*  --   --  27.0*  PLT 538*  --   --  412  --   --  363  APTT  --  31  --   --   --   --   --   LABPROT  --  17.1*  --   --  15.1  --   --   INR  --  1.41  --   --  1.20  --   --   HEPARINUNFRC  --  <0.10*  --   --   --   --   --   CREATININE 14.94*  --    < > 13.72*  --  9.18* 7.89*  CKTOTAL 129  --   --   --   --   --   --   TROPONINI <0.03  --   --  0.06*  --  0.09*  --    < > = values in this interval not displayed.    Estimated Creatinine Clearance: 9.2 mL/min (A) (by C-G formula based on SCr of 7.89 mg/dL (H)).   Medical History: Past Medical History:  Diagnosis Date  . Atrial fibrillation ([place])   . Cancer ([place])    a. 06/2011 b. s/p right hemicolectomy/ colon CA  . Colon polyps   . Dysrhythmia   . Hypertension   . Hyperthyroidism    a. 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 Stroke ([place]) 03/2012   a. residual right sided weakness  . Type 1 diabetes ([facility])    a. on insulin b. prior DKA     Assessment: Pharmacy consulted for heparin drip dosing and monitoring in 67 yo male patient admitted with acute kidney injury. Patient was taking rivaroxaban 20mg  with supper PTA. Last dose believed to be 8/16 @ around 1800 per family.   Baseline Heparin level is <0.10  Goal of Therapy:  Heparin level 0.3-0.7 units/ml aPTT 66-102 seconds Monitor platelets by anticoagulation protocol: Yes   Plan:  Heparin 4000 units bolus x 1  Start heparin infusion at 1200 units/hr  Will order anti-Xa level in 6 hours, and anti-Xa level at least daily while on heparin Continue to monitor H&H and platelets  Pernell Dupre, PharmD, BCPS Clinical  Pharmacist 09/23/2017 12:50 PM

## 2017-09-23 NOTE — Progress Notes (Signed)
[facility] [place] NAME: Jordan [name]    MR#:  [id number]  DATE OF BIRTH:  [birth year]  SUBJECTIVE:   Patient very lethargic sleepy. Currently getting hemodialysis. No Family in the ICU Pt on heparin gtt REVIEW OF SYSTEMS:   Review of Systems  Unable to perform ROS: Mental status change   DRUG ALLERGIES:   Allergies  Allergen Reactions  . Amiodarone     VITALS:  Blood pressure (!) 134/58, pulse 63, temperature 98.4 F (36.9 C), temperature source Oral, resp. rate 18, weight 79.6 kg, SpO2 95 %.  PHYSICAL EXAMINATION:   Physical Exam  GENERAL:  67 y.o.-year-old patient lying in the bed with no acute distress. Critically ill EYES: Pupils equal, round, reactive to light and accommodation. No scleral icterus. Extraocular muscles intact.  HEENT: Head atraumatic, normocephalic. Oropharynx and nasopharynx clear.  NECK:  Supple, no jugular venous distention. No thyroid enlargement, no tenderness.  LUNGS: Normal breath sounds bilaterally, no wheezing, rales, rhonchi. No use of accessory muscles of respiration.  CARDIOVASCULAR: S1, S2 normal. No murmurs, rubs, or gallops.  ABDOMEN: Soft, nontender, nondistended. Bowel sounds present. No organomegaly or mass.  EXTREMITIES: No cyanosis, clubbing or edema b/l.   Right groin HD access NEUROLOGIC: unable to assess due to mentation. Moves all extremities well   PSYCHIATRIC:  patient is lethargic and very sleepy SKIN: No obvious rash, lesion, or ulcer.   LABORATORY PANEL:  CBC Recent Labs  Lab 09/23/17 0425  WBC 13.4*  HGB 9.3*  HCT 27.0*  PLT 363    Chemistries  Recent Labs  Lab 09/22/17 2112 09/23/17 0425  NA 140 146*  K 3.4* 3.3*  CL 98 101  CO2 18* 25  GLUCOSE 279* 227*  BUN 94* 80*  CREATININE 9.18* 7.89*  CALCIUM 8.1* 8.2*  MG 2.2  --   AST  --  12*  ALT  --  12  ALKPHOS  --  80  BILITOT  --  1.7*   Cardiac Enzymes Recent Labs  Lab 09/22/17 2112   TROPONINI 0.09*   RADIOLOGY:  Ct Abdomen Pelvis Wo Contrast  Result Date: 09/22/2017 CLINICAL DATA:  Acute renal failure. EXAM: CT ABDOMEN AND PELVIS WITHOUT CONTRAST TECHNIQUE: Multidetector CT imaging of the abdomen and pelvis was performed following the standard protocol without IV contrast. COMPARISON:  06/28/2011 FINDINGS: Lower chest: Small bilateral pleural effusions with associated atelectasis. Calcified plaque over the right coronary artery. Hepatobiliary: Gallbladder is contracted. Liver and biliary tree are normal. Pancreas: Normal. Spleen: Normal. Adrenals/Urinary Tract: Adrenal glands are normal. Kidneys are normal in size with symmetric bilateral mild hydroureteronephrosis. No renal stones. 1.4 cm cyst over the upper pole right kidney. Foley catheter is present within a decompressed bladder as there is increased density to the fluid within the bladder possibly representing hemorrhagic debris in may be partially responsible for the hydroureteronephrosis. Stomach/Bowel: Stomach and small bowel are within normal. Appendix is not visualized. Evidence of patient's previous partial right colectomy. Vascular/Lymphatic: Minimal calcified plaque over the abdominal aorta. No adenopathy. Reproductive: Within normal. Other: Left femoral venous catheter is present with tip over the left external iliac pain near the junction to the common iliac vein. There is intermediate density material over the left pelvis which crosses midline at and just above the level of the seminal vesicles. This likely represents hemorrhagic debris. This encompasses the distal left ureter and is also adjacent the distal right ureter lesser degree and may be partially responsible for  the bilateral hydroureteronephrosis. Musculoskeletal: Degenerative change of the spine and hips. IMPRESSION: Bilateral symmetric mild hydroureteronephrosis. Foley catheter present within a collapsed bladder as there is increased density material within  the bladder possibly hemorrhagic debris. There is also increased density material over the left pelvis with some crossing midline adjacent both distal ureters left worse than right likely hemorrhagic debris. These factors may be responsible for the hydroureteronephrosis. Small bilateral pleural effusions with associated bibasilar atelectasis. 1.4 cm right renal cyst. Previous partial right colectomy. Left femoral venous catheter with tip over the external iliac vein near the junction to common iliac vein. Electronically Signed   By: Marin Olp M.D.   On: 09/22/2017 09:58   US Renal  Result Date: 09/21/2017 CLINICAL DATA:  67 y/o M; acute kidney injury. History of colon cancer. EXAM: RENAL / URINARY TRACT ULTRASOUND COMPLETE COMPARISON:  None. FINDINGS: Right Kidney: Length: 12.0. Mildly increased cortical echogenicity. Right kidney upper pole 1.5 cm and mid pole 1.8 cm simple cysts. Moderate hydronephrosis. Left Kidney: Length: 11.9. Mild increased cortical echogenicity. No mass. Moderate hydronephrosis. Bladder: Thickened bladder wall to 1.7 cm. IMPRESSION: 1. Moderate bilateral hydronephrosis. 2. Mild increased echogenicity of the renal cortices compatible with medical renal disease. 3. Bladder wall thickening may reflect cystitis or sequelae of chronic outflow obstruction and underdistention. Electronically Signed   By: Kristine Garbe M.D.   On: 09/21/2017 18:25   Dg Chest Port 1 View  Result Date: 09/23/2017 CLINICAL DATA:  Acute respiratory failure EXAM: PORTABLE CHEST 1 VIEW COMPARISON:  09/22/2017 FINDINGS: Cardiac shadow is stable. The lungs are mildly hyperinflated. Previously seen nodular pattern is again identified but significantly improved when compare with the prior exam. Continued follow-up is recommended. Degenerative changes of the thoracolumbar spine are noted. IMPRESSION: Persistent but improved nodular pattern when compared with the prior exam. Electronically Signed   By:  Inez Catalina M.D.   On: 09/23/2017 08:27   Dg Chest Port 1 View  Result Date: 09/22/2017 CLINICAL DATA:  Dyspnea and tachycardia. EXAM: PORTABLE CHEST 1 VIEW COMPARISON:  09/21/2017 FINDINGS: Heart size and pulmonary vascularity are normal. Since previous study, there is interval development of diffuse nodular perihilar infiltration bilaterally with some peribronchial thickening. This likely represents bronchopneumonia although edema or atypical infectious process could also have this appearance. No blunting of costophrenic angles. No pneumothorax. Mediastinal contours appear intact. IMPRESSION: Interval development of diffuse bilateral nodular perihilar infiltrates with peribronchial thickening suggesting bronchopneumonia or edema. Electronically Signed   By: Lucienne Capers M.D.   On: 09/22/2017 04:29   Ir Nephrostomy Placement Left  Result Date: 09/22/2017 INDICATION: 26 year old with acute kidney injury, sepsis and bilateral hydronephrosis. Patient needs decompression of both kidneys. EXAM: PLACEMENT OF BILATERAL NEPHROSTOMY TUBES WITH ULTRASOUND AND FLUOROSCOPIC GUIDANCE COMPARISON:  None. MEDICATIONS: Ciprofloxacin 400 mg; The antibiotic was administered in an appropriate time frame prior to skin puncture. ANESTHESIA/SEDATION: Fentanyl 50 mcg IV; Versed 2 mg IV Moderate Sedation Time:  40 minutes The patient was continuously monitored during the procedure by the interventional radiology nurse under my direct supervision. CONTRAST:  10 mL-administered into the collecting system(s) FLUOROSCOPY TIME:  Fluoroscopy Time: 3 minutes and 30 seconds, 25 mGy COMPLICATIONS: None immediate. PROCEDURE: Informed written consent was obtained from the patient's sister after a thorough discussion of the procedural risks, benefits and alternatives. All questions were addressed. Maximal Sterile Barrier Technique was utilized including caps, mask, sterile gowns, sterile gloves, sterile drape, hand hygiene and skin  antiseptic. A timeout was performed prior to the  initiation of the procedure. Patient was placed prone. Both flanks were prepped and draped in sterile fashion. Procedure was technically difficult due to tachypnea and patient restlessness. Left kidney was identified with ultrasound. The left flank was anesthetized with 1% lidocaine. 21 gauge needle directed into a lower pole calyx with ultrasound guidance. Small amount of contrast was injected to confirm placement in the renal collecting system. 0.018 wire was advanced into the renal collecting system and Accustick dilator set was placed. J wire was advanced down the ureter and the tract was dilated to accommodate a 10 [place] multipurpose drain. 10 French drain was reconstituted in the renal pelvis. Catheter was sutured to skin and attached to gravity bag. Attention was directed to the right kidney. Right kidney is more difficult to evaluate with ultrasound. Right flank was anesthetized with 1% lidocaine. 21 gauge needle was directed into a dilated midpole calyx with ultrasound guidance. Needle position confirmed within the collecting system with ultrasound. A 0.018 wire advanced easily down the ureter. An Accustick dilator set was placed. J wire was advanced down the ureter and the Accustick dilator set was replaced with a 10 [place] dilator. Subsequently, a 77 French drain was advanced over the wire and reconstituted in the right renal pelvis. It is difficult to reconstitute the pigtail drain in the renal pelvis to the small size of the collecting system. Catheter was attached to a gravity bag and sutured to the skin. Fluoroscopic and ultrasound images were taken and saved for documentation. FINDINGS: Mild to moderate bilateral hydronephrosis. Nephrostomy tubes are positioned in the renal pelvis bilaterally. IMPRESSION: Successful placement of bilateral percutaneous nephrostomy tubes with ultrasound and fluoroscopic guidance. Electronically Signed   By: Markus Daft  M.D.   On: 09/22/2017 15:43   Ir Nephrostomy Placement Right  Result Date: 09/22/2017 INDICATION: 54 year old with acute kidney injury, sepsis and bilateral hydronephrosis. Patient needs decompression of both kidneys. EXAM: PLACEMENT OF BILATERAL NEPHROSTOMY TUBES WITH ULTRASOUND AND FLUOROSCOPIC GUIDANCE COMPARISON:  None. MEDICATIONS: Ciprofloxacin 400 mg; The antibiotic was administered in an appropriate time frame prior to skin puncture. ANESTHESIA/SEDATION: Fentanyl 50 mcg IV; Versed 2 mg IV Moderate Sedation Time:  40 minutes The patient was continuously monitored during the procedure by the interventional radiology nurse under my direct supervision. CONTRAST:  10 mL-administered into the collecting system(s) FLUOROSCOPY TIME:  Fluoroscopy Time: 3 minutes and 30 seconds, 25 mGy COMPLICATIONS: None immediate. PROCEDURE: Informed written consent was obtained from the patient's sister after a thorough discussion of the procedural risks, benefits and alternatives. All questions were addressed. Maximal Sterile Barrier Technique was utilized including caps, mask, sterile gowns, sterile gloves, sterile drape, hand hygiene and skin antiseptic. A timeout was performed prior to the initiation of the procedure. Patient was placed prone. Both flanks were prepped and draped in sterile fashion. Procedure was technically difficult due to tachypnea and patient restlessness. Left kidney was identified with ultrasound. The left flank was anesthetized with 1% lidocaine. 21 gauge needle directed into a lower pole calyx with ultrasound guidance. Small amount of contrast was injected to confirm placement in the renal collecting system. 0.018 wire was advanced into the renal collecting system and Accustick dilator set was placed. J wire was advanced down the ureter and the tract was dilated to accommodate a 10 [place] multipurpose drain. 10 French drain was reconstituted in the renal pelvis. Catheter was sutured to skin and  attached to gravity bag. Attention was directed to the right kidney. Right kidney is more difficult to evaluate with  ultrasound. Right flank was anesthetized with 1% lidocaine. 21 gauge needle was directed into a dilated midpole calyx with ultrasound guidance. Needle position confirmed within the collecting system with ultrasound. A 0.018 wire advanced easily down the ureter. An Accustick dilator set was placed. J wire was advanced down the ureter and the Accustick dilator set was replaced with a 10 [place] dilator. Subsequently, a 26 French drain was advanced over the wire and reconstituted in the right renal pelvis. It is difficult to reconstitute the pigtail drain in the renal pelvis to the small size of the collecting system. Catheter was attached to a gravity bag and sutured to the skin. Fluoroscopic and ultrasound images were taken and saved for documentation. FINDINGS: Mild to moderate bilateral hydronephrosis. Nephrostomy tubes are positioned in the renal pelvis bilaterally. IMPRESSION: Successful placement of bilateral percutaneous nephrostomy tubes with ultrasound and fluoroscopic guidance. Electronically Signed   By: Markus Daft M.D.   On: 09/22/2017 15:43   ASSESSMENT AND PLAN:  Coltrane Tugwell  is a 67 y.o. male brought in by family secondary to altered mental status.  The patient has a history of stroke and has trouble speaking his words and has baseline right-sided weakness.  He states his appetite has been poor for the past week.  In the ER, his creatinine was found to be 14.94 with a potassium of 6.1.   1.  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  bladder scan showing only 80 cc in the bladder.  IV fluid hydration   Likely acute kidney injury from poor appetite and obstructive uropathy.  Hold nephrotoxic medications. -Patient is started on urgent hemodialysis through right femoral HD Access -Seen by urology. Patient is s/p  bilateral nephrectomy tube  secondary to hydronephrosis with possible obstructive mass in the bladder as opposed to hemorrhagic debris -creat 14.0--9.18--7.89 -UC no growth  2.  Hyperkalemia.  Likely from acute kidney injury.  IV fluid hydration.  Monitor on telemetry.  pt got a dose of lokelma and IV calcium. -improved  3.  Type 1 diabetes mellitus.  Will need a lower dose glargine insulin plus sliding scale.  Watch closely for hypoglycemia with the acute kidney injury.  4.  History of atrial fibrillation.  With his creatinine clearance being where it is the sotalol is contraindicated.    -recievedIV heparin drip and continue his propranolol. -currently in SR -defer long term anticoagulation to Dr khan--Ok to d/c heparin gtt and monitor  5.  History of stroke with right-sided weakness.  Physical therapy consultation.  6.  Essential hypertension on propranolol.  Hold benazepril.  7.  Hyperlipidemia unspecified.  On low-dose Crestor.  CPK normal range  8.  Falls.  Physical therapy evaluation    Overall pt appears quiet sick  Case discussed with Care Management/Social Worker. Management plans discussed with the patient, family and they are in agreement.  CODE STATUS: full  DVT Prophylaxis: heparin  TOTAL TIME TAKING CARE OF THIS PATIENT: 30* minutes.  >50% time spent on counselling and coordination of care  POSSIBLE D/C IN *few* DAYS, DEPENDING ON CLINICAL CONDITION.  Note: This dictation was prepared with Dragon dictation along with smaller phrase technology. Any transcriptional errors that result from this process are unintentional.  Fritzi Mandes M.D on 09/23/2017 at 4:27 PM  Between 7am to 6pm - Pager - [phone]  After 6pm go to [email or web address] - password [email or web address] [facility]  Office  [phone]  CC: Primary care physician; Albina Billet,  MDPatient ID: Jordan [name], male   DOB: 01-Aug-1950, 67 y.o.   MRN: [id number]

## 2017-09-23 NOTE — Progress Notes (Signed)
Follow up - Critical Care Medicine Note  Patient Details:    Jordan [name] is an 67 y.o. male. with a medical history of type 1 diabetes, previous CVA, atrial fibrillation, colon cancer status post resection, and hyperthyroidism who was admitted with acute renal failure, acute encephalopathy, and hyperkalemia.   Lines, Airways, Drains: Nephrostomy Left 10.2 Fr. (Active)  Dressing Status Clean;Dry;Intact 09/23/2017  7:35 AM  Dressing Type Tegaderm 09/23/2017  7:35 AM  Tube Status To gravity 09/23/2017  7:35 AM  Collection Container Leg bag 09/23/2017  7:35 AM  Input (mL) 150 mL 09/22/2017 10:57 PM  Output (mL) 200 mL 09/23/2017  8:22 AM     Nephrostomy Right 10.2 Fr. (Active)  Dressing Status Clean;Dry;Intact 09/23/2017  7:35 AM  Dressing Type Tegaderm 09/23/2017  7:35 AM  Tube Status To gravity 09/23/2017  7:35 AM  Collection Container Leg bag 09/23/2017  7:35 AM  Input (mL) 175 mL 09/22/2017 10:57 PM  Output (mL) 200 mL 09/23/2017  8:22 AM     Urethral Catheter  (Active)  Indication for Insertion or Continuance of Catheter Acute urinary retention;Unstable critical patients (first 24-48 hours) 09/23/2017  7:35 AM  Site Assessment Clean;Intact;Dry 09/23/2017  7:35 AM  Catheter Maintenance Bag below level of bladder;Catheter secured;Drainage bag/tubing not touching floor;Insertion date on drainage bag;No dependent loops;Seal intact 09/23/2017  7:35 AM  Collection Container Standard drainage bag 09/23/2017  7:35 AM  Securement Method Securing device (Describe) 09/23/2017  7:35 AM  Urinary Catheter Interventions Unclamped 09/23/2017  2:00 AM  Output (mL) 35 mL 09/23/2017  5:00 AM    Anti-infectives:  Anti-infectives (From admission, onward)   Start     Dose/Rate Route Frequency Ordered Stop   09/22/17 1430  ciprofloxacin (CIPRO) IVPB 400 mg     400 mg 200 mL/hr over 60 Minutes Intravenous  Once 09/22/17 1421 09/22/17 1721   09/22/17 1415  ciprofloxacin (CIPRO) IVPB 400 mg  Status:  Discontinued      400 mg 200 mL/hr over 60 Minutes Intravenous Every 12 hours 09/22/17 1414 09/22/17 1421   09/22/17 0600  cefTRIAXone (ROCEPHIN) 1 g in sodium chloride 0.9 % 100 mL IVPB  Status:  Discontinued     1 g 200 mL/hr over 30 Minutes Intravenous Every 24 hours 09/21/17 1549 09/22/17 0354   09/22/17 0354  cefTRIAXone (ROCEPHIN) 2 g in sodium chloride 0.9 % 100 mL IVPB     2 g 200 mL/hr over 30 Minutes Intravenous Every 24 hours 09/22/17 0354     09/21/17 1430  vancomycin (VANCOCIN) IVPB 1000 mg/200 mL premix     1,000 mg 200 mL/hr over 60 Minutes Intravenous  Once 09/21/17 1429 09/21/17 1644   09/21/17 1430  piperacillin-tazobactam (ZOSYN) IVPB 3.375 g     3.375 g 100 mL/hr over 30 Minutes Intravenous  Once 09/21/17 1429 09/21/17 1543      Microbiology: Results for orders placed or performed during the hospital encounter of 09/21/17  Urine culture     Status: None   Collection Time: 09/21/17  2:31 PM  Result Value Ref Range Status   Specimen Description   Final    URINE, RANDOM Performed at [facility], [address], [place], [place] [address]    Special Requests   Final    NONE Performed at [facility], [address] [place], [place] [address]    Culture   Final    NO GROWTH Performed at [facility], [place] [address], [place], [place]  [address]    Report Status 09/22/2017 FINAL  Final  Blood culture (routine x 2)     Status: None (Preliminary result)   Collection Time: 09/21/17  2:50 PM  Result Value Ref Range Status   Specimen Description BLOOD Blood Culture adequate volume  Final   Special Requests   Final    BOTTLES DRAWN AEROBIC AND ANAEROBIC BLOOD RIGHT ARM   Culture   Final    NO GROWTH < 24 HOURS Performed at [facility], [address] [place], [place] [address]    Report Status PENDING  Incomplete  Blood culture (routine x 2)     Status: None (Preliminary result)   Collection Time: 09/21/17  2:50 PM  Result Value Ref Range  Status   Specimen Description BLOOD BLOOD LEFT HAND  Final   Special Requests BOTTLES DRAWN AEROBIC AND ANAEROBIC  Final   Culture   Final    NO GROWTH < 24 HOURS Performed at [facility], [address], [place], [place] [address]    Report Status PENDING  Incomplete  MRSA PCR Screening     Status: None   Collection Time: 09/22/17  7:22 AM  Result Value Ref Range Status   MRSA by PCR NEGATIVE NEGATIVE Final    Comment:        The GeneXpert MRSA Assay (FDA approved for NASAL specimens only), is one component of a comprehensive MRSA colonization surveillance program. It is not intended to diagnose MRSA infection nor to guide or monitor treatment for MRSA infections. Performed at [facility], [address] [place], [place] [address]    Studies: Ct Abdomen Pelvis Wo Contrast  Result Date: 09/22/2017 CLINICAL DATA:  Acute renal failure. EXAM: CT ABDOMEN AND PELVIS WITHOUT CONTRAST TECHNIQUE: Multidetector CT imaging of the abdomen and pelvis was performed following the standard protocol without IV contrast. COMPARISON:  06/28/2011 FINDINGS: Lower chest: Small bilateral pleural effusions with associated atelectasis. Calcified plaque over the right coronary artery. Hepatobiliary: Gallbladder is contracted. Liver and biliary tree are normal. Pancreas: Normal. Spleen: Normal. Adrenals/Urinary Tract: Adrenal glands are normal. Kidneys are normal in size with symmetric bilateral mild hydroureteronephrosis. No renal stones. 1.4 cm cyst over the upper pole right kidney. Foley catheter is present within a decompressed bladder as there is increased density to the fluid within the bladder possibly representing hemorrhagic debris in may be partially responsible for the hydroureteronephrosis. Stomach/Bowel: Stomach and small bowel are within normal. Appendix is not visualized. Evidence of patient's previous partial right colectomy. Vascular/Lymphatic: Minimal calcified plaque over  the abdominal aorta. No adenopathy. Reproductive: Within normal. Other: Left femoral venous catheter is present with tip over the left external iliac pain near the junction to the common iliac vein. There is intermediate density material over the left pelvis which crosses midline at and just above the level of the seminal vesicles. This likely represents hemorrhagic debris. This encompasses the distal left ureter and is also adjacent the distal right ureter lesser degree and may be partially responsible for the bilateral hydroureteronephrosis. Musculoskeletal: Degenerative change of the spine and hips. IMPRESSION: Bilateral symmetric mild hydroureteronephrosis. Foley catheter present within a collapsed bladder as there is increased density material within the bladder possibly hemorrhagic debris. There is also increased density material over the left pelvis with some crossing midline adjacent both distal ureters left worse than right likely hemorrhagic debris. These factors may be responsible for the hydroureteronephrosis. Small bilateral pleural effusions with associated bibasilar atelectasis. 1.4 cm right renal cyst.  Previous partial right colectomy. Left femoral venous catheter with tip over the external iliac vein near the junction to common iliac vein. Electronically Signed   By: Marin Olp M.D.   On: 09/22/2017 09:58   Dg Chest 1 View  Result Date: 09/21/2017 CLINICAL DATA:  Altered mental status. EXAM: CHEST  1 VIEW COMPARISON:  03/19/2014 FINDINGS: Lungs are adequately inflated and otherwise clear. Cardiomediastinal silhouette and remainder the exam is unchanged. IMPRESSION: No active disease. Electronically Signed   By: Marin Olp M.D.   On: 09/21/2017 14:54   Ct Head Wo Contrast  Result Date: 09/21/2017 CLINICAL DATA:  Altered mental status.  Previous CVA EXAM: CT HEAD WITHOUT CONTRAST TECHNIQUE: Contiguous axial images were obtained from the base of the skull through the vertex without  intravenous contrast. COMPARISON:  Jun 10, 2012 FINDINGS: Brain: There is mild diffuse atrophy. There is a degree of ex vacuo phenomenon involving the anterior horn of the left lateral ventricle. There is no intracranial mass, hemorrhage, extra-axial fluid collection, or midline shift. There is evidence of a prior infarct involving portions of the superior, anterior left temporal lobe as well as much of the anterior left basal ganglia. Elsewhere there is mild small vessel disease in the centra semiovale bilaterally. No acute infarct is demonstrated on this study. Vascular: There is no evident hyperdense vessel. There is no appreciable vascular calcification. Skull: The bony calvarium  appears intact. Sinuses/Orbits: There is mucosal thickening in several ethmoid air cells. Other visualized paranasal sinuses are clear. Orbits appear symmetric bilaterally. Other: Mastoid air cells are clear. IMPRESSION: Mild atrophy. Prior infarct involving much of the left basal ganglia, particularly anteriorly, as well as a portion of the superior left temporal lobe anteriorly. Elsewhere there is mild periventricular small vessel disease. No acute infarct is evident. No mass or hemorrhage. There is mucosal thickening in several ethmoid air cells. Electronically Signed   By: Lowella Grip III M.D.   On: 09/21/2017 14:17   US Renal  Result Date: 09/21/2017 CLINICAL DATA:  67 y/o M; acute kidney injury. History of colon cancer. EXAM: RENAL / URINARY TRACT ULTRASOUND COMPLETE COMPARISON:  None. FINDINGS: Right Kidney: Length: 12.0. Mildly increased cortical echogenicity. Right kidney upper pole 1.5 cm and mid pole 1.8 cm simple cysts. Moderate hydronephrosis. Left Kidney: Length: 11.9. Mild increased cortical echogenicity. No mass. Moderate hydronephrosis. Bladder: Thickened bladder wall to 1.7 cm. IMPRESSION: 1. Moderate bilateral hydronephrosis. 2. Mild increased echogenicity of the renal cortices compatible with medical renal  disease. 3. Bladder wall thickening may reflect cystitis or sequelae of chronic outflow obstruction and underdistention. Electronically Signed   By: Kristine Garbe M.D.   On: 09/21/2017 18:25   Dg Chest Port 1 View  Result Date: 09/23/2017 CLINICAL DATA:  Acute respiratory failure EXAM: PORTABLE CHEST 1 VIEW COMPARISON:  09/22/2017 FINDINGS: Cardiac shadow is stable. The lungs are mildly hyperinflated. Previously seen nodular pattern is again identified but significantly improved when compare with the prior exam. Continued follow-up is recommended. Degenerative changes of the thoracolumbar spine are noted. IMPRESSION: Persistent but improved nodular pattern when compared with the prior exam. Electronically Signed   By: Inez Catalina M.D.   On: 09/23/2017 08:27   Dg Chest Port 1 View  Result Date: 09/22/2017 CLINICAL DATA:  Dyspnea and tachycardia. EXAM: PORTABLE CHEST 1 VIEW COMPARISON:  09/21/2017 FINDINGS: Heart size and pulmonary vascularity are normal. Since previous study, there is interval development of diffuse nodular perihilar infiltration bilaterally with some peribronchial thickening. This likely  represents bronchopneumonia although edema or atypical infectious process could also have this appearance. No blunting of costophrenic angles. No pneumothorax. Mediastinal contours appear intact. IMPRESSION: Interval development of diffuse bilateral nodular perihilar infiltrates with peribronchial thickening suggesting bronchopneumonia or edema. Electronically Signed   By: Lucienne Capers M.D.   On: 09/22/2017 04:29   Ir Nephrostomy Placement Left  Result Date: 09/22/2017 INDICATION: 89 year old with acute kidney injury, sepsis and bilateral hydronephrosis. Patient needs decompression of both kidneys. EXAM: PLACEMENT OF BILATERAL NEPHROSTOMY TUBES WITH ULTRASOUND AND FLUOROSCOPIC GUIDANCE COMPARISON:  None. MEDICATIONS: Ciprofloxacin 400 mg; The antibiotic was administered in an appropriate  time frame prior to skin puncture. ANESTHESIA/SEDATION: Fentanyl 50 mcg IV; Versed 2 mg IV Moderate Sedation Time:  40 minutes The patient was continuously monitored during the procedure by the interventional radiology nurse under my direct supervision. CONTRAST:  10 mL-administered into the collecting system(s) FLUOROSCOPY TIME:  Fluoroscopy Time: 3 minutes and 30 seconds, 25 mGy COMPLICATIONS: None immediate. PROCEDURE: Informed written consent was obtained from the patient's sister after a thorough discussion of the procedural risks, benefits and alternatives. All questions were addressed. Maximal Sterile Barrier Technique was utilized including caps, mask, sterile gowns, sterile gloves, sterile drape, hand hygiene and skin antiseptic. A timeout was performed prior to the initiation of the procedure. Patient was placed prone. Both flanks were prepped and draped in sterile fashion. Procedure was technically difficult due to tachypnea and patient restlessness. Left kidney was identified with ultrasound. The left flank was anesthetized with 1% lidocaine. 21 gauge needle directed into a lower pole calyx with ultrasound guidance. Small amount of contrast was injected to confirm placement in the renal collecting system. 0.018 wire was advanced into the renal collecting system and Accustick dilator set was placed. J wire was advanced down the ureter and the tract was dilated to accommodate a 10 [place] multipurpose drain. 10 French drain was reconstituted in the renal pelvis. Catheter was sutured to skin and attached to gravity bag. Attention was directed to the right kidney. Right kidney is more difficult to evaluate with ultrasound. Right flank was anesthetized with 1% lidocaine. 21 gauge needle was directed into a dilated midpole calyx with ultrasound guidance. Needle position confirmed within the collecting system with ultrasound. A 0.018 wire advanced easily down the ureter. An Accustick dilator set was placed. J  wire was advanced down the ureter and the Accustick dilator set was replaced with a 10 [place] dilator. Subsequently, a 20 French drain was advanced over the wire and reconstituted in the right renal pelvis. It is difficult to reconstitute the pigtail drain in the renal pelvis to the small size of the collecting system. Catheter was attached to a gravity bag and sutured to the skin. Fluoroscopic and ultrasound images were taken and saved for documentation. FINDINGS: Mild to moderate bilateral hydronephrosis. Nephrostomy tubes are positioned in the renal pelvis bilaterally. IMPRESSION: Successful placement of bilateral percutaneous nephrostomy tubes with ultrasound and fluoroscopic guidance. Electronically Signed   By: Markus Daft M.D.   On: 09/22/2017 15:43   Ir Nephrostomy Placement Right  Result Date: 09/22/2017 INDICATION: 52 year old with acute kidney injury, sepsis and bilateral hydronephrosis. Patient needs decompression of both kidneys. EXAM: PLACEMENT OF BILATERAL NEPHROSTOMY TUBES WITH ULTRASOUND AND FLUOROSCOPIC GUIDANCE COMPARISON:  None. MEDICATIONS: Ciprofloxacin 400 mg; The antibiotic was administered in an appropriate time frame prior to skin puncture. ANESTHESIA/SEDATION: Fentanyl 50 mcg IV; Versed 2 mg IV Moderate Sedation Time:  40 minutes The patient was continuously monitored during the procedure by  the interventional radiology nurse under my direct supervision. CONTRAST:  10 mL-administered into the collecting system(s) FLUOROSCOPY TIME:  Fluoroscopy Time: 3 minutes and 30 seconds, 25 mGy COMPLICATIONS: None immediate. PROCEDURE: Informed written consent was obtained from the patient's sister after a thorough discussion of the procedural risks, benefits and alternatives. All questions were addressed. Maximal Sterile Barrier Technique was utilized including caps, mask, sterile gowns, sterile gloves, sterile drape, hand hygiene and skin antiseptic. A timeout was performed prior to the initiation  of the procedure. Patient was placed prone. Both flanks were prepped and draped in sterile fashion. Procedure was technically difficult due to tachypnea and patient restlessness. Left kidney was identified with ultrasound. The left flank was anesthetized with 1% lidocaine. 21 gauge needle directed into a lower pole calyx with ultrasound guidance. Small amount of contrast was injected to confirm placement in the renal collecting system. 0.018 wire was advanced into the renal collecting system and Accustick dilator set was placed. J wire was advanced down the ureter and the tract was dilated to accommodate a 10 [place] multipurpose drain. 10 French drain was reconstituted in the renal pelvis. Catheter was sutured to skin and attached to gravity bag. Attention was directed to the right kidney. Right kidney is more difficult to evaluate with ultrasound. Right flank was anesthetized with 1% lidocaine. 21 gauge needle was directed into a dilated midpole calyx with ultrasound guidance. Needle position confirmed within the collecting system with ultrasound. A 0.018 wire advanced easily down the ureter. An Accustick dilator set was placed. J wire was advanced down the ureter and the Accustick dilator set was replaced with a 10 [place] dilator. Subsequently, a 17 French drain was advanced over the wire and reconstituted in the right renal pelvis. It is difficult to reconstitute the pigtail drain in the renal pelvis to the small size of the collecting system. Catheter was attached to a gravity bag and sutured to the skin. Fluoroscopic and ultrasound images were taken and saved for documentation. FINDINGS: Mild to moderate bilateral hydronephrosis. Nephrostomy tubes are positioned in the renal pelvis bilaterally. IMPRESSION: Successful placement of bilateral percutaneous nephrostomy tubes with ultrasound and fluoroscopic guidance. Electronically Signed   By: Markus Daft M.D.   On: 09/22/2017 15:43    Consults: Treatment Team:   Hermelinda Dellen, DO Murlean Iba, MD Irine Seal, MD Dionisio David, MD   Subjective:    Overnight Issues: patient is now status post percutaneous decompression of bilateral hydronephrosis and received hemodialysis. His resting comfortably  Objective:  Vital signs for last 24 hours: Temp:  [96.9 F (36.1 C)-98.1 F (36.7 C)] 98 F (36.7 C) (08/19 0819) Pulse Rate:  [55-129] 55 (08/19 0819) Resp:  [16-31] 17 (08/19 0819) BP: (92-131)/(54-86) 109/69 (08/19 0700) SpO2:  [89 %-100 %] 100 % (08/19 0700)  Hemodynamic parameters for last 24 hours:    Intake/Output from previous day: 08/18 0701 - 08/19 0700 In: 4373.5 [P.O.:220; I.V.:3258.5; IV Piggyback:570] Out: 6075 [Urine:6075]  Intake/Output this shift: Total I/O In: 0  Out: 875 [Urine:875]  Vent settings for last 24 hours:    Physical Exam:  Vital signs: Please see the above listed vital signs HEENT: Trachea is midline, no thyromegaly appreciated, no oral lesions noted Cardiovascular: Tachycardia with atrial fibrillation with ventricular response of 120 Pulmonary: Coarse rhonchi appreciated Abdominal: Positive bowel sounds, soft exam Extremities: No clubbing cyanosis or edema noted  Assessment/Plan:   Renal failure. Status post hemodialysis and percutaneous decompression. This morning BUN is 80, creatinine is 7.89, potassium  is 3.3. Patient is on a bicarbonate drip. Will defer to nephrology 1 to stop bicarbonate infusion  Leukocytosis of 13.4. Patient is empirically on Rocephin  Anemia. 9.3. No evidence of active bleeding  Hyperglycemia. On sliding scale coverage  Fibrillation. Patient has adverse reaction to amiodarone, is on a Cardizem infusion  Critical care time 40 minutes  Jordan [name] 09/23/2017  *Care during the described time interval was provided by me and/or other providers on the critical care team.  I have reviewed this patient's available data, including medical history, events of note,  physical examination and test results as part of my evaluation.

## 2017-09-23 NOTE — Evaluation (Addendum)
Clinical/Bedside Swallow Evaluation Patient Details  Name: Jordan [name] MRN: [id number] Date of Birth: 1950-08-31  Today's Date: 09/23/2017 Time: SLP Start Time (ACUTE ONLY): 1045 SLP Stop Time (ACUTE ONLY): 1145 SLP Time Calculation (min) (ACUTE ONLY): 60 min  Past Medical History:  Past Medical History:  Diagnosis Date  . Atrial fibrillation ([place])   . Cancer ([place])    a. 06/2011 b. s/p right hemicolectomy/ colon CA  . Colon polyps   . Dysrhythmia   . Hypertension   . Hyperthyroidism    a. 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 Stroke ([place]) 03/2012   a. residual right sided weakness  . Type 1 diabetes ([place])    a. on insulin b. prior DKA    Past Surgical History:  Past Surgical History:  Procedure Laterality Date  . CARDIOVERSION N/A 03/25/2014   Procedure: CARDIOVERSION;  Surgeon: Lelon Perla, MD;  Location: [facility] ENDOSCOPY;  Service: Cardiovascular;  Laterality: N/A;  . COLONOSCOPY    . COLONOSCOPY WITH PROPOFOL N/A 07/24/2017   Procedure: COLONOSCOPY WITH PROPOFOL;  Surgeon: Toledo, Benay Pike, MD;  Location: [facility] ENDOSCOPY;  Service: Gastroenterology;  Laterality: N/A;  . HEMICOLECTOMY Right   . IR NEPHROSTOMY PLACEMENT LEFT  09/22/2017  . IR NEPHROSTOMY PLACEMENT RIGHT  09/22/2017   HPI:   Pt  is a 67 y.o. male w/ PMH including colon Ca, HTN, DM, afib brought in by family secondary to altered mental status.  The patient has a history of stroke and has trouble speaking his words and has baseline right-sided weakness.  He states his appetite has been poor for the past week.  His brother who throws out his trash noticed that there was not much food in the garbage can this past week.  He has had some bruises behind the right shoulder.  The patient has not been able to tell me if he has been falling down.  The patient lives alone.  He does not use any devices with walking around.  In the ER, his creatinine was found to be 14.94 with a potassium of 6.1.  White  blood cell count also elevated at 32.3.  Assessment / Plan / Recommendation Clinical Impression  Pt appears to present w/ mild-moderate declined Cognitive status significantly impacting his overall awareness and orientation to the task of oral intake - this can certainly impact his overall oral intake and increase oropharyngeal phase swallowing issues which can increase risk for aspiration and decline in Pulmonary status. Pt appeared to tolerate trials of Nectar consistency liquids via TSP then straw and purees fed to him w/ no immediate, overt s/s of coughing/throat clearing noted; no decline in respiratory status occurred during/post trials. Noted multiple (2) swallowing ongoing but this seemed to be his baseline for full (oral) clearing as it was consistently noted w/ all trials. (Unable to assess vocal quality post trials d/t nonverbal status.) When given trials, pt exhibited multiple swallows in a consistent pattern for fully clearing bolus residue. He seemed to become less agreeable to po trials as they continued then he turned his head and said "no". Oral phase was c/b min+ prolonged oral phase timing as he dealt w/ bolus trials given; min increased time for bolus management, A-P transfer, and oral clearing. Pt required mod+ visual/verbal cues for oral awareness to accept boluses and attend to the tasks. Pt was nonverbal w/ mumbled/muttered speech; did not follow commands consistently. Recommend a dysphagia level 1(Puree) diet w/ NECTAR consistency  liquids via cup/straw; strict aspiration precautions and feeding support at meals. Pt's declined Cognitive status could impact his ability to safely maintain oral intake for nutrition/hydration needs. Recommend Dietician f/u. Recommend Pills CRUSHED.   SLP Visit Diagnosis: Dysphagia, oropharyngeal phase (R13.12)    Aspiration Risk  Mild aspiration risk;Moderate aspiration risk    Diet Recommendation  Dysphagia level 1 (PUREE) w/ NECTAR consistency  liquids; aspiration precautions; feeding support and monitoring of status during meals  Medication Administration: Crushed with puree(as able)    Other  Recommendations Recommended Consults: (Dietician f/u) Oral Care Recommendations: Oral care BID;Staff/trained caregiver to provide oral care Other Recommendations: Order thickener from pharmacy;Prohibited food (jello, ice cream, thin soups);Remove water pitcher;Have oral suction available   Follow up Recommendations Skilled Nursing facility      Frequency and Duration min 3x week  2 weeks       Prognosis Prognosis for Safe Diet Advancement: Fair Barriers to Reach Goals: Cognitive deficits;Severity of deficits      Swallow Study   General Date of Onset: 09/21/17 Type of Study: Bedside Swallow Evaluation Previous Swallow Assessment: none reported Diet Prior to this Study: NPO(regular diet at home reported) Temperature Spikes Noted: No(wbc 13.4 down from 32) Respiratory Status: Room air History of Recent Intubation: No Behavior/Cognition: Cooperative;Confused;Distractible;Doesn't follow directions(eyes closed during session; awake) Oral Cavity Assessment: Within Functional Limits(but limited assessment d/t cognitive status/cooperation) Oral Care Completed by SLP: Recent completion by staff Oral Cavity - Dentition: Adequate natural dentition(quickly assessed d/t cognitive status) Vision: (n/a) Self-Feeding Abilities: Total assist Patient Positioning: Upright in bed Baseline Vocal Quality: (few phonations WFL for volume) Volitional Cough: Cognitively unable to elicit Volitional Swallow: Unable to elicit    Oral/Motor/Sensory Function Overall Oral Motor/Sensory Function: (appeared WFL during OM movements to orally clear)   Ice Chips Ice chips: Not tested   Thin Liquid Thin Liquid: Not tested    Nectar Thick Nectar Thick Liquid: Within functional limits(min confusion using straw initially) Presentation: Spoon;Straw(fed; ~3 ozs  total) Other Comments: verbal/tactile cues   Honey Thick Honey Thick Liquid: Not tested   Puree Puree: Within functional limits(verbal/tactile cues accepting trials) Presentation: Spoon(fed; ~2-3 ozs)   Solid     Solid: Not tested       Orinda Kenner, MS, CCC-SLP Watson,Katherine 09/23/2017,12:19 PM

## 2017-09-23 NOTE — Progress Notes (Signed)
SUBJECTIVE: patient is SOB   Vitals:   09/23/17 0500 09/23/17 0600 09/23/17 0700 09/23/17 0819  BP: 109/60 (!) 104/58 109/69   Pulse: (!) 123 (!) 129 (!) 125 (!) 55  Resp: 16 17 17 17   Temp:    98 F (36.7 C)  TempSrc:    Oral  SpO2: 100% 100% 100%   Weight:        Intake/Output Summary (Last 24 hours) at 09/23/2017 0844 Last data filed at 09/23/2017 8588 Gross per 24 hour  Intake 4253.51 ml  Output 6950 ml  Net -2696.49 ml    LABS: Basic Metabolic Panel: Recent Labs    09/22/17 0415 09/22/17 2112 09/23/17 0425  NA 136 140 146*  K 4.7 3.4* 3.3*  CL 107 98 101  CO2 9* 18* 25  GLUCOSE 223* 279* 227*  BUN 122* 94* 80*  CREATININE 13.72* 9.18* 7.89*  CALCIUM 8.2* 8.1* 8.2*  MG 2.5* 2.2  --   PHOS 8.7* 6.7*  --    Liver Function Tests: Recent Labs    09/22/17 0415 09/23/17 0425  AST 12* 12*  ALT 15 12  ALKPHOS 70 80  BILITOT 1.7* 1.7*  PROT 6.1* 6.0*  ALBUMIN 2.6* 2.3*   No results for input(s): LIPASE, AMYLASE in the last 72 hours. CBC: Recent Labs    09/21/17 1342 09/22/17 0415 09/23/17 0425  WBC 32.3* 25.4* 13.4*  NEUTROABS 30.4* 23.0*  --   HGB 10.2* 9.5* 9.3*  HCT 30.8* 27.8* 27.0*  MCV 89.8 87.6 85.9  PLT 538* 412 363   Cardiac Enzymes: Recent Labs    09/21/17 1342 09/22/17 0415 09/22/17 2112  CKTOTAL 129  --   --   TROPONINI <0.03 0.06* 0.09*   BNP: Invalid input(s): POCBNP D-Dimer: No results for input(s): DDIMER in the last 72 hours. Hemoglobin A1C: Recent Labs    09/21/17 2235  HGBA1C 7.2*   Fasting Lipid Panel: No results for input(s): CHOL, HDL, LDLCALC, TRIG, CHOLHDL, LDLDIRECT in the last 72 hours. Thyroid Function Tests: Recent Labs    09/22/17 0422  TSH 1.911   Anemia Panel: No results for input(s): VITAMINB12, FOLATE, FERRITIN, TIBC, IRON, RETICCTPCT in the last 72 hours.   PHYSICAL EXAM General: Well developed, well nourished, in no acute distress HEENT:  Normocephalic and atramatic Neck:  No JVD.  Lungs:  Clear bilaterally to auscultation and percussion. Heart: HRRR . Normal S1 and S2 without gallops or murmurs.  Abdomen: Bowel sounds are positive, abdomen soft and non-tender  Msk:  Back normal, normal gait. Normal strength and tone for age. Extremities: No clubbing, cyanosis or edema.   Neuro: Alert and oriented X 3. Psych:  Good affect, responds appropriately  TELEMETRY: NSR  ASSESSMENT AND PLAN: Afib with RVR, unable tostart amiodrone as allergic. May give metoprolol 25 po bid.  Active Problems:   Acute kidney injury (HCC)    Jordan David, MD, [facility] 09/23/2017 8:44 AM

## 2017-09-23 NOTE — Progress Notes (Signed)
Urology Consult Follow Up  Subjective: Patient is resting comfortably.  He had a prior stroke in 2014 and has right hemiplegia and expressive aphasia and therefore cannot clearly answer questions.  His serum creatinine is 7.89 down from 14.03 at admission.  His WBC count is down to 13.4 from 32.3 from the time of admission.  Urine and blood cultures are negative.  Hemoglobin at 9.3.  PSA is 3.96.  And CEA is still pending.  UOP is good with most urine output from nephrostomy tubes.  Urine is yellow clear.    Anti-infectives: Anti-infectives (From admission, onward)   Start     Dose/Rate Route Frequency Ordered Stop   09/22/17 1430  ciprofloxacin (CIPRO) IVPB 400 mg     400 mg 200 mL/hr over 60 Minutes Intravenous  Once 09/22/17 1421 09/22/17 1721   09/22/17 1415  ciprofloxacin (CIPRO) IVPB 400 mg  Status:  Discontinued     400 mg 200 mL/hr over 60 Minutes Intravenous Every 12 hours 09/22/17 1414 09/22/17 1421   09/22/17 0600  cefTRIAXone (ROCEPHIN) 1 g in sodium chloride 0.9 % 100 mL IVPB  Status:  Discontinued     1 g 200 mL/hr over 30 Minutes Intravenous Every 24 hours 09/21/17 1549 09/22/17 0354   09/22/17 0354  cefTRIAXone (ROCEPHIN) 2 g in sodium chloride 0.9 % 100 mL IVPB     2 g 200 mL/hr over 30 Minutes Intravenous Every 24 hours 09/22/17 0354     09/21/17 1430  vancomycin (VANCOCIN) IVPB 1000 mg/200 mL premix     1,000 mg 200 mL/hr over 60 Minutes Intravenous  Once 09/21/17 1429 09/21/17 1644   09/21/17 1430  piperacillin-tazobactam (ZOSYN) IVPB 3.375 g     3.375 g 100 mL/hr over 30 Minutes Intravenous  Once 09/21/17 1429 09/21/17 1543      Current Facility-Administered Medications  Medication Dose Route Frequency Provider Last Rate Last Dose  . acetaminophen (TYLENOL) tablet 650 mg  650 mg Oral Q6H PRN Loletha Grayer, MD       Or  . acetaminophen (TYLENOL) suppository 650 mg  650 mg Rectal Q6H PRN Wieting, Richard, MD      . cefTRIAXone (ROCEPHIN) 2 g in sodium chloride  0.9 % 100 mL IVPB  2 g Intravenous Q24H Arta Silence, MD   Stopped at 09/23/17 0446  . Chlorhexidine Gluconate Cloth 2 % PADS 6 each  6 each Topical Q0600 Murlean Iba, MD   6 each at 09/23/17 0511  . diltiazem (CARDIZEM) 100 mg in dextrose 5 % 100 mL (1 mg/mL) infusion  5-15 mg/hr Intravenous Continuous Tukov-Yual, Magdalene S, NP 15 mL/hr at 09/23/17 0600 15 mg/hr at 09/23/17 0600  . famotidine (PEPCID) IVPB 20 mg premix  20 mg Intravenous Q24H Tukov-Yual, Magdalene S, NP   Stopped at 09/22/17 1050  . folic acid (FOLVITE) tablet 1 mg  1 mg Oral Daily Wieting, Richard, MD      . haloperidol lactate (HALDOL) injection 1 mg  1 mg Intravenous Q6H PRN Wieting, Richard, MD      . heparin injection 1,000-6,000 Units  1,000-6,000 Units Intracatheter PRN Tukov-Yual, Magdalene S, NP      . insulin aspart (novoLOG) injection 0-15 Units  0-15 Units Subcutaneous Q6H Tukov-Yual, Magdalene S, NP   5 Units at 09/23/17 0511  . insulin glargine (LANTUS) injection 8 Units  8 Units Subcutaneous QHS Loletha Grayer, MD   8 Units at 09/22/17 2153  . multivitamin with minerals tablet 1 tablet  1 tablet Oral  Daily Wieting, Richard, MD      . potassium chloride 10 mEq in 50 mL *CENTRAL LINE* IVPB  10 mEq Intravenous Q1 Hr x 4 Tukov-Yual, Magdalene S, NP      . propranolol (INDERAL) tablet 80 mg  80 mg Oral TID Loletha Grayer, MD   80 mg at 09/22/17 1636  . rosuvastatin (CRESTOR) tablet 5 mg  5 mg Oral q1800 Wieting, Richard, MD      . sodium bicarbonate 150 mEq in sterile water 1,000 mL infusion   Intravenous Continuous Loletha Grayer, MD 125 mL/hr at 09/23/17 0600    . tamsulosin (FLOMAX) capsule 0.4 mg  0.4 mg Oral QPC supper Loletha Grayer, MD   0.4 mg at 09/21/17 2244     Objective: Vital signs in last 24 hours: Temp:  [96.9 F (36.1 C)-98.1 F (36.7 C)] 98 F (36.7 C) (08/19 0400) Pulse Rate:  [95-160] 125 (08/19 0700) Resp:  [16-31] 17 (08/19 0700) BP: (83-131)/(54-88) 109/69 (08/19  0700) SpO2:  [89 %-100 %] 100 % (08/19 0700)  Intake/Output from previous day: 08/18 0701 - 08/19 0700 In: 4373.5 [P.O.:220; I.V.:3258.5; IV Piggyback:570] Out: 6075 [Urine:6075] Intake/Output this shift: No intake/output data recorded.   Physical Exam Constitutional: Well nourished. Alert and oriented, No acute distress. HEENT: [place] AT, moist mucus membranes. Trachea midline, no masses. Cardiovascular: No clubbing, cyanosis, or edema. Respiratory: Normal respiratory effort, no increased work of breathing. GI: Abdomen is soft, non tender, non distended, no abdominal masses.  GU: No CVA tenderness.  No bladder fullness or masses.  Foley in place with clear yellow urine.  Skin: No rashes, bruises or suspicious lesions. Lymph: No cervical or inguinal adenopathy. Neurologic: Grossly intact, no focal deficits, moving all 4 extremities. Psychiatric: Normal mood and affect.  Lab Results:  Recent Labs    09/22/17 0415 09/23/17 0425  WBC 25.4* 13.4*  HGB 9.5* 9.3*  HCT 27.8* 27.0*  PLT 412 363   BMET Recent Labs    09/22/17 2112 09/23/17 0425  NA 140 146*  K 3.4* 3.3*  CL 98 101  CO2 18* 25  GLUCOSE 279* 227*  BUN 94* 80*  CREATININE 9.18* 7.89*  CALCIUM 8.1* 8.2*   PT/INR Recent Labs    09/21/17 1553 09/22/17 1224  LABPROT 17.1* 15.1  INR 1.41 1.20   ABG Recent Labs    09/22/17 2228 09/23/17 0420  PHART 7.52* 7.54*  HCO3 18.0* 24.8    Studies/Results: Ct Abdomen Pelvis Wo Contrast  Result Date: 09/22/2017 CLINICAL DATA:  Acute renal failure. EXAM: CT ABDOMEN AND PELVIS WITHOUT CONTRAST TECHNIQUE: Multidetector CT imaging of the abdomen and pelvis was performed following the standard protocol without IV contrast. COMPARISON:  06/28/2011 FINDINGS: Lower chest: Small bilateral pleural effusions with associated atelectasis. Calcified plaque over the right coronary artery. Hepatobiliary: Gallbladder is contracted. Liver and biliary tree are normal. Pancreas:  Normal. Spleen: Normal. Adrenals/Urinary Tract: Adrenal glands are normal. Kidneys are normal in size with symmetric bilateral mild hydroureteronephrosis. No renal stones. 1.4 cm cyst over the upper pole right kidney. Foley catheter is present within a decompressed bladder as there is increased density to the fluid within the bladder possibly representing hemorrhagic debris in may be partially responsible for the hydroureteronephrosis. Stomach/Bowel: Stomach and small bowel are within normal. Appendix is not visualized. Evidence of patient's previous partial right colectomy. Vascular/Lymphatic: Minimal calcified plaque over the abdominal aorta. No adenopathy. Reproductive: Within normal. Other: Left femoral venous catheter is present with tip over the left external iliac  pain near the junction to the common iliac vein. There is intermediate density material over the left pelvis which crosses midline at and just above the level of the seminal vesicles. This likely represents hemorrhagic debris. This encompasses the distal left ureter and is also adjacent the distal right ureter lesser degree and may be partially responsible for the bilateral hydroureteronephrosis. Musculoskeletal: Degenerative change of the spine and hips. IMPRESSION: Bilateral symmetric mild hydroureteronephrosis. Foley catheter present within a collapsed bladder as there is increased density material within the bladder possibly hemorrhagic debris. There is also increased density material over the left pelvis with some crossing midline adjacent both distal ureters left worse than right likely hemorrhagic debris. These factors may be responsible for the hydroureteronephrosis. Small bilateral pleural effusions with associated bibasilar atelectasis. 1.4 cm right renal cyst. Previous partial right colectomy. Left femoral venous catheter with tip over the external iliac vein near the junction to common iliac vein. Electronically Signed   By: Marin Olp M.D.   On: 09/22/2017 09:58   Dg Chest 1 View  Result Date: 09/21/2017 CLINICAL DATA:  Altered mental status. EXAM: CHEST  1 VIEW COMPARISON:  03/19/2014 FINDINGS: Lungs are adequately inflated and otherwise clear. Cardiomediastinal silhouette and remainder the exam is unchanged. IMPRESSION: No active disease. Electronically Signed   By: Marin Olp M.D.   On: 09/21/2017 14:54   Ct Head Wo Contrast  Result Date: 09/21/2017 CLINICAL DATA:  Altered mental status.  Previous CVA EXAM: CT HEAD WITHOUT CONTRAST TECHNIQUE: Contiguous axial images were obtained from the base of the skull through the vertex without intravenous contrast. COMPARISON:  Jun 10, 2012 FINDINGS: Brain: There is mild diffuse atrophy. There is a degree of ex vacuo phenomenon involving the anterior horn of the left lateral ventricle. There is no intracranial mass, hemorrhage, extra-axial fluid collection, or midline shift. There is evidence of a prior infarct involving portions of the superior, anterior left temporal lobe as well as much of the anterior left basal ganglia. Elsewhere there is mild small vessel disease in the centra semiovale bilaterally. No acute infarct is demonstrated on this study. Vascular: There is no evident hyperdense vessel. There is no appreciable vascular calcification. Skull: The bony calvarium  appears intact. Sinuses/Orbits: There is mucosal thickening in several ethmoid air cells. Other visualized paranasal sinuses are clear. Orbits appear symmetric bilaterally. Other: Mastoid air cells are clear. IMPRESSION: Mild atrophy. Prior infarct involving much of the left basal ganglia, particularly anteriorly, as well as a portion of the superior left temporal lobe anteriorly. Elsewhere there is mild periventricular small vessel disease. No acute infarct is evident. No mass or hemorrhage. There is mucosal thickening in several ethmoid air cells. Electronically Signed   By: Lowella Grip III M.D.   On:  09/21/2017 14:17   US Renal  Result Date: 09/21/2017 CLINICAL DATA:  67 y/o M; acute kidney injury. History of colon cancer. EXAM: RENAL / URINARY TRACT ULTRASOUND COMPLETE COMPARISON:  None. FINDINGS: Right Kidney: Length: 12.0. Mildly increased cortical echogenicity. Right kidney upper pole 1.5 cm and mid pole 1.8 cm simple cysts. Moderate hydronephrosis. Left Kidney: Length: 11.9. Mild increased cortical echogenicity. No mass. Moderate hydronephrosis. Bladder: Thickened bladder wall to 1.7 cm. IMPRESSION: 1. Moderate bilateral hydronephrosis. 2. Mild increased echogenicity of the renal cortices compatible with medical renal disease. 3. Bladder wall thickening may reflect cystitis or sequelae of chronic outflow obstruction and underdistention. Electronically Signed   By: Kristine Garbe M.D.   On: 09/21/2017 18:25  Dg Chest Port 1 View  Result Date: 09/22/2017 CLINICAL DATA:  Dyspnea and tachycardia. EXAM: PORTABLE CHEST 1 VIEW COMPARISON:  09/21/2017 FINDINGS: Heart size and pulmonary vascularity are normal. Since previous study, there is interval development of diffuse nodular perihilar infiltration bilaterally with some peribronchial thickening. This likely represents bronchopneumonia although edema or atypical infectious process could also have this appearance. No blunting of costophrenic angles. No pneumothorax. Mediastinal contours appear intact. IMPRESSION: Interval development of diffuse bilateral nodular perihilar infiltrates with peribronchial thickening suggesting bronchopneumonia or edema. Electronically Signed   By: Lucienne Capers M.D.   On: 09/22/2017 04:29   Ir Nephrostomy Placement Left  Result Date: 09/22/2017 INDICATION: 49 year old with acute kidney injury, sepsis and bilateral hydronephrosis. Patient needs decompression of both kidneys. EXAM: PLACEMENT OF BILATERAL NEPHROSTOMY TUBES WITH ULTRASOUND AND FLUOROSCOPIC GUIDANCE COMPARISON:  None. MEDICATIONS: Ciprofloxacin  400 mg; The antibiotic was administered in an appropriate time frame prior to skin puncture. ANESTHESIA/SEDATION: Fentanyl 50 mcg IV; Versed 2 mg IV Moderate Sedation Time:  40 minutes The patient was continuously monitored during the procedure by the interventional radiology nurse under my direct supervision. CONTRAST:  10 mL-administered into the collecting system(s) FLUOROSCOPY TIME:  Fluoroscopy Time: 3 minutes and 30 seconds, 25 mGy COMPLICATIONS: None immediate. PROCEDURE: Informed written consent was obtained from the patient's sister after a thorough discussion of the procedural risks, benefits and alternatives. All questions were addressed. Maximal Sterile Barrier Technique was utilized including caps, mask, sterile gowns, sterile gloves, sterile drape, hand hygiene and skin antiseptic. A timeout was performed prior to the initiation of the procedure. Patient was placed prone. Both flanks were prepped and draped in sterile fashion. Procedure was technically difficult due to tachypnea and patient restlessness. Left kidney was identified with ultrasound. The left flank was anesthetized with 1% lidocaine. 21 gauge needle directed into a lower pole calyx with ultrasound guidance. Small amount of contrast was injected to confirm placement in the renal collecting system. 0.018 wire was advanced into the renal collecting system and Accustick dilator set was placed. J wire was advanced down the ureter and the tract was dilated to accommodate a 10 [place] multipurpose drain. 10 French drain was reconstituted in the renal pelvis. Catheter was sutured to skin and attached to gravity bag. Attention was directed to the right kidney. Right kidney is more difficult to evaluate with ultrasound. Right flank was anesthetized with 1% lidocaine. 21 gauge needle was directed into a dilated midpole calyx with ultrasound guidance. Needle position confirmed within the collecting system with ultrasound. A 0.018 wire advanced easily  down the ureter. An Accustick dilator set was placed. J wire was advanced down the ureter and the Accustick dilator set was replaced with a 10 [place] dilator. Subsequently, a 25 French drain was advanced over the wire and reconstituted in the right renal pelvis. It is difficult to reconstitute the pigtail drain in the renal pelvis to the small size of the collecting system. Catheter was attached to a gravity bag and sutured to the skin. Fluoroscopic and ultrasound images were taken and saved for documentation. FINDINGS: Mild to moderate bilateral hydronephrosis. Nephrostomy tubes are positioned in the renal pelvis bilaterally. IMPRESSION: Successful placement of bilateral percutaneous nephrostomy tubes with ultrasound and fluoroscopic guidance. Electronically Signed   By: Markus Daft M.D.   On: 09/22/2017 15:43   Ir Nephrostomy Placement Right  Result Date: 09/22/2017 INDICATION: 18 year old with acute kidney injury, sepsis and bilateral hydronephrosis. Patient needs decompression of both kidneys. EXAM: PLACEMENT OF BILATERAL NEPHROSTOMY  TUBES WITH ULTRASOUND AND FLUOROSCOPIC GUIDANCE COMPARISON:  None. MEDICATIONS: Ciprofloxacin 400 mg; The antibiotic was administered in an appropriate time frame prior to skin puncture. ANESTHESIA/SEDATION: Fentanyl 50 mcg IV; Versed 2 mg IV Moderate Sedation Time:  40 minutes The patient was continuously monitored during the procedure by the interventional radiology nurse under my direct supervision. CONTRAST:  10 mL-administered into the collecting system(s) FLUOROSCOPY TIME:  Fluoroscopy Time: 3 minutes and 30 seconds, 25 mGy COMPLICATIONS: None immediate. PROCEDURE: Informed written consent was obtained from the patient's sister after a thorough discussion of the procedural risks, benefits and alternatives. All questions were addressed. Maximal Sterile Barrier Technique was utilized including caps, mask, sterile gowns, sterile gloves, sterile drape, hand hygiene and skin  antiseptic. A timeout was performed prior to the initiation of the procedure. Patient was placed prone. Both flanks were prepped and draped in sterile fashion. Procedure was technically difficult due to tachypnea and patient restlessness. Left kidney was identified with ultrasound. The left flank was anesthetized with 1% lidocaine. 21 gauge needle directed into a lower pole calyx with ultrasound guidance. Small amount of contrast was injected to confirm placement in the renal collecting system. 0.018 wire was advanced into the renal collecting system and Accustick dilator set was placed. J wire was advanced down the ureter and the tract was dilated to accommodate a 10 [place] multipurpose drain. 10 French drain was reconstituted in the renal pelvis. Catheter was sutured to skin and attached to gravity bag. Attention was directed to the right kidney. Right kidney is more difficult to evaluate with ultrasound. Right flank was anesthetized with 1% lidocaine. 21 gauge needle was directed into a dilated midpole calyx with ultrasound guidance. Needle position confirmed within the collecting system with ultrasound. A 0.018 wire advanced easily down the ureter. An Accustick dilator set was placed. J wire was advanced down the ureter and the Accustick dilator set was replaced with a 10 [place] dilator. Subsequently, a 77 French drain was advanced over the wire and reconstituted in the right renal pelvis. It is difficult to reconstitute the pigtail drain in the renal pelvis to the small size of the collecting system. Catheter was attached to a gravity bag and sutured to the skin. Fluoroscopic and ultrasound images were taken and saved for documentation. FINDINGS: Mild to moderate bilateral hydronephrosis. Nephrostomy tubes are positioned in the renal pelvis bilaterally. IMPRESSION: Successful placement of bilateral percutaneous nephrostomy tubes with ultrasound and fluoroscopic guidance. Electronically Signed   By: Markus Daft  M.D.   On: 09/22/2017 15:43     Assessment and Plan 1.  Sepsis with AKI and bilateral hydronephrosis Bilateral nephrostomy tubes are in place with good urinary output of clear yellow urine WBC count and creatinine are improving -continue to trend    2.  Pelvic mass, left greater than right PSA 3.96 CEA pending May need mass biopsy at some point contingent on patient's recovery.   LOS: 2 days    [facility] 09/23/2017

## 2017-09-24 LAB — BASIC METABOLIC PANEL
ANION GAP: 14 (ref 5–15)
Anion gap: 14 (ref 5–15)
BUN: 47 mg/dL — AB (ref 8–23)
BUN: 37 mg/dL — ABNORMAL HIGH (ref 8–23)
CALCIUM: 9.1 mg/dL (ref 8.9–10.3)
CHLORIDE: 112 mmol/L — AB (ref 98–111)
CO2: 25 mmol/L (ref 22–32)
CO2: 24 mmol/L (ref 22–32)
CREATININE: 3.39 mg/dL — AB (ref 0.61–1.24)
Calcium: 8.3 mg/dL — ABNORMAL LOW (ref 8.9–10.3)
Chloride: 113 mmol/L — ABNORMAL HIGH (ref 98–111)
Creatinine, Ser: 4.18 mg/dL — ABNORMAL HIGH (ref 0.61–1.24)
GFR calc Af Amer: 16 mL/min — ABNORMAL LOW (ref >60)
GFR calc Af Amer: 20 mL/min — ABNORMAL LOW (ref >60)
GFR calc non Af Amer: 14 mL/min — ABNORMAL LOW (ref >60)
GFR calc non Af Amer: 17 mL/min — ABNORMAL LOW (ref >60)
GLUCOSE: 338 mg/dL — AB (ref 70–99)
Glucose, Bld: 246 mg/dL — ABNORMAL HIGH (ref 70–99)
POTASSIUM: 3.1 mmol/L — AB (ref 3.5–5.1)
Potassium: 3.4 mmol/L — ABNORMAL LOW (ref 3.5–5.1)
SODIUM: 151 mmol/L — AB (ref 135–145)
Sodium: 151 mmol/L — ABNORMAL HIGH (ref 135–145)

## 2017-09-24 LAB — CBC
HEMATOCRIT: 28.1 % — AB (ref 40.0–52.0)
HEMOGLOBIN: 9.9 g/dL — AB (ref 13.0–18.0)
MCH: 31 pg (ref 26.0–34.0)
MCHC: 35.4 g/dL (ref 32.0–36.0)
MCV: 87.6 fL (ref 80.0–100.0)
Platelets: 322 10*3/uL (ref 150–440)
RBC: 3.21 MIL/uL — AB (ref 4.40–5.90)
RDW: 13 % (ref 11.5–14.5)
WBC: 10.7 10*3/uL — ABNORMAL HIGH (ref 3.8–10.6)

## 2017-09-24 LAB — GLUCOSE, CAPILLARY
GLUCOSE-CAPILLARY: 216 mg/dL — AB (ref 70–99)
GLUCOSE-CAPILLARY: 268 mg/dL — AB (ref 70–99)
GLUCOSE-CAPILLARY: 301 mg/dL — AB (ref 70–99)
GLUCOSE-CAPILLARY: 341 mg/dL — AB (ref 70–99)
Glucose-Capillary: 172 mg/dL — ABNORMAL HIGH (ref 70–99)
Glucose-Capillary: 278 mg/dL — ABNORMAL HIGH (ref 70–99)

## 2017-09-24 LAB — HEPATITIS B CORE ANTIBODY, TOTAL: Hep B Core Total Ab: NEGATIVE

## 2017-09-24 LAB — HEPATITIS C ANTIBODY: HCV Ab: <0.1 s/co ratio (ref 0.0–0.9)

## 2017-09-24 LAB — HEPATITIS B SURFACE ANTIGEN: Hepatitis B Surface Ag: NEGATIVE

## 2017-09-24 LAB — CEA: CEA: 2.8 ng/mL (ref 0.0–4.7)

## 2017-09-24 LAB — PHOSPHORUS: Phosphorus: 2.8 mg/dL (ref 2.5–4.6)

## 2017-09-24 LAB — HIV ANTIBODY (ROUTINE TESTING W REFLEX): HIV Screen 4th Generation wRfx: NONREACTIVE

## 2017-09-24 LAB — MAGNESIUM: Magnesium: 1.8 mg/dL (ref 1.7–2.4)

## 2017-09-24 LAB — HEPATITIS B SURFACE ANTIBODY,QUALITATIVE: Hep B S Ab: NONREACTIVE

## 2017-09-24 MED ORDER — DIGOXIN 0.25 MG/ML IJ SOLN
0.2500 mg | Freq: Once | INTRAMUSCULAR | Status: AC
  Administered 2017-09-24: 0.25 mg via INTRAVENOUS
  Filled 2017-09-24: qty 2

## 2017-09-24 MED ORDER — AMIODARONE HCL IN DEXTROSE 360-4.14 MG/200ML-% IV SOLN: 60.0000 mg/h | INTRAVENOUS | Status: DC

## 2017-09-24 MED ORDER — DIGOXIN 125 MCG PO TABS
0.0625 mg | ORAL_TABLET | Freq: Every day | ORAL | Status: DC
  Administered 2017-09-25 – 2017-09-30 (×4): 0.0625 mg via ORAL
  Filled 2017-09-24 (×7): qty 0.5

## 2017-09-24 MED ORDER — POTASSIUM PHOSPHATES 15 MMOLE/5ML IV SOLN
40.0000 meq | Freq: Once | INTRAVENOUS | Status: AC
  Administered 2017-09-25: 40 meq via INTRAVENOUS
  Filled 2017-09-24: qty 9.09

## 2017-09-24 MED ORDER — AMIODARONE HCL IN DEXTROSE 360-4.14 MG/200ML-% IV SOLN: 30.0000 mg/h | INTRAVENOUS | Status: DC

## 2017-09-24 MED ORDER — DILTIAZEM HCL-DEXTROSE 100-5 MG/100ML-% IV SOLN (PREMIX)
5.0000 mg/h | INTRAVENOUS | Status: DC
  Administered 2017-09-24: 5 mg/h via INTRAVENOUS
  Filled 2017-09-24: qty 100

## 2017-09-24 MED ORDER — SODIUM CHLORIDE 0.45 % IV SOLN
INTRAVENOUS | Status: DC
  Administered 2017-09-24 – 2017-09-25 (×3): via INTRAVENOUS

## 2017-09-24 MED ORDER — DILTIAZEM HCL 100 MG IV SOLR
5.0000 mg/h | INTRAVENOUS | Status: DC
  Administered 2017-09-24: 5 mg/h via INTRAVENOUS
  Filled 2017-09-24: qty 100

## 2017-09-24 MED ORDER — METOPROLOL TARTRATE 5 MG/5ML IV SOLN
2.5000 mg | Freq: Once | INTRAVENOUS | Status: AC
  Administered 2017-09-24: 2.5 mg via INTRAVENOUS
  Filled 2017-09-24: qty 5

## 2017-09-24 MED ORDER — POTASSIUM CHLORIDE 10 MEQ/100ML IV SOLN
10.0000 meq | INTRAVENOUS | Status: AC
  Administered 2017-09-24 (×4): 10 meq via INTRAVENOUS
  Filled 2017-09-24 (×4): qty 100

## 2017-09-24 MED ORDER — DIGOXIN 0.25 MG/ML IJ SOLN
0.2500 mg | INTRAMUSCULAR | Status: AC
  Administered 2017-09-24: 0.25 mg via INTRAVENOUS
  Filled 2017-09-24: qty 2

## 2017-09-24 MED ORDER — AMIODARONE LOAD VIA INFUSION: 150.0000 mg | Freq: Once | INTRAVENOUS | Status: DC

## 2017-09-24 MED ORDER — DIGOXIN 0.25 MG/ML IJ SOLN
0.1250 mg | Freq: Once | INTRAMUSCULAR | Status: AC
  Administered 2017-09-24: 0.125 mg via INTRAVENOUS
  Filled 2017-09-24: qty 2

## 2017-09-24 MED ORDER — MAGNESIUM SULFATE 2 GM/50ML IV SOLN
2.0000 g | Freq: Once | INTRAVENOUS | Status: AC
  Administered 2017-09-25: 2 g via INTRAVENOUS
  Filled 2017-09-24: qty 50

## 2017-09-24 MED ORDER — INSULIN GLARGINE 100 UNIT/ML ~~LOC~~ SOLN
25.0000 [IU] | Freq: Every day | SUBCUTANEOUS | Status: DC
  Administered 2017-09-24: 25 [IU] via SUBCUTANEOUS
  Filled 2017-09-24 (×2): qty 0.25

## 2017-09-24 NOTE — Progress Notes (Signed)
SUBJECTIVE: patient is feeling comfortable   Vitals:   09/24/17 0545 09/24/17 0610 09/24/17 0700 09/24/17 0800  BP: 116/64 124/61 134/65 (!) 146/64  Pulse: (!) 130   65  Resp:  17 15 19   Temp:  98.4 F (36.9 C)  98.6 F (37 C)  TempSrc:  Oral  Oral  SpO2:    98%  Weight:  77 kg      Intake/Output Summary (Last 24 hours) at 09/24/2017 0907 Last data filed at 09/24/2017 0805 Gross per 24 hour  Intake 1407.61 ml  Output 5175 ml  Net -3767.39 ml    LABS: Basic Metabolic Panel: Recent Labs    09/22/17 0415 09/22/17 2112 09/23/17 0425 09/24/17 0448  NA 136 140 146* 151*  K 4.7 3.4* 3.3* 3.1*  CL 107 98 101 112*  CO2 9* 18* 25 25  GLUCOSE 223* 279* 227* 246*  BUN 122* 94* 80* 47*  CREATININE 13.72* 9.18* 7.89* 4.18*  CALCIUM 8.2* 8.1* 8.2* 8.3*  MG 2.5* 2.2  --   --   PHOS 8.7* 6.7*  --   --    Liver Function Tests: Recent Labs    09/22/17 0415 09/23/17 0425  AST 12* 12*  ALT 15 12  ALKPHOS 70 80  BILITOT 1.7* 1.7*  PROT 6.1* 6.0*  ALBUMIN 2.6* 2.3*   No results for input(s): LIPASE, AMYLASE in the last 72 hours. CBC: Recent Labs    09/21/17 1342 09/22/17 0415 09/23/17 0425 09/24/17 0448  WBC 32.3* 25.4* 13.4* 10.7*  NEUTROABS 30.4* 23.0*  --   --   HGB 10.2* 9.5* 9.3* 9.9*  HCT 30.8* 27.8* 27.0* 28.1*  MCV 89.8 87.6 85.9 87.6  PLT 538* 412 363 322   Cardiac Enzymes: Recent Labs    09/21/17 1342 09/22/17 0415 09/22/17 2112  CKTOTAL 129  --   --   TROPONINI <0.03 0.06* 0.09*   BNP: Invalid input(s): POCBNP D-Dimer: No results for input(s): DDIMER in the last 72 hours. Hemoglobin A1C: Recent Labs    09/21/17 2235  HGBA1C 7.2*   Fasting Lipid Panel: No results for input(s): CHOL, HDL, LDLCALC, TRIG, CHOLHDL, LDLDIRECT in the last 72 hours. Thyroid Function Tests: Recent Labs    09/22/17 0422  TSH 1.911   Anemia Panel: No results for input(s): VITAMINB12, FOLATE, FERRITIN, TIBC, IRON, RETICCTPCT in the last 72 hours.   PHYSICAL  EXAM General: Well developed, well nourished, in no acute distress HEENT:  Normocephalic and atramatic Neck:  No JVD.  Lungs: Clear bilaterally to auscultation and percussion. Heart: HRRR . Normal S1 and S2 without gallops or murmurs.  Abdomen: Bowel sounds are positive, abdomen soft and non-tender  Msk:  Back normal, normal gait. Normal strength and tone for age. Extremities: No clubbing, cyanosis or edema.   Neuro: Alert and oriented X 3. Psych:  Good affect, responds appropriately  TELEMETRY: sinus rhythm about 75 bpm  ASSESSMENT AND PLAN: status post CVA with atrial fibrillation and rapid ventricular response rate and renal failure. Currently patient is in sinus rhythm and continue current medications. Patient is feeling much better and can be moved to the floor.  Active Problems:   Acute kidney injury (HCC)    Lorenzo Arscott A, MD, [facility] 09/24/2017 9:07 AM

## 2017-09-24 NOTE — Progress Notes (Signed)
[facility] [place] NAME: Jordan [name]    MR#:  [id number]  DATE OF BIRTH:  06-21-1950  SUBJECTIVE:   Patient very lethargic sleepy. No Family in the ICU  REVIEW OF SYSTEMS:   Review of Systems  Unable to perform ROS: Mental status change   DRUG ALLERGIES:   Allergies  Allergen Reactions  . Amiodarone     VITALS:  Blood pressure (!) 167/76, pulse 78, temperature 98.5 F (36.9 C), temperature source Oral, resp. rate 20, weight 77 kg, SpO2 96 %.  PHYSICAL EXAMINATION:   Physical Exam  GENERAL:  67 y.o.-year-old patient lying in the bed with no acute distress. Critically ill EYES: Pupils equal, round, reactive to light and accommodation. No scleral icterus. Extraocular muscles intact.  HEENT: Head atraumatic, normocephalic. Oropharynx and nasopharynx clear.  NECK:  Supple, no jugular venous distention. No thyroid enlargement, no tenderness.  LUNGS: Normal breath sounds bilaterally, no wheezing, rales, rhonchi. No use of accessory muscles of respiration.  CARDIOVASCULAR: S1, S2 normal. No murmurs, rubs, or gallops.  ABDOMEN: Soft, nontender, nondistended. Bowel sounds present. No organomegaly or mass.  EXTREMITIES: No cyanosis, clubbing or edema b/l.   Right groin HD access NEUROLOGIC: unable to assess due to mentation. Moves all extremities well   PSYCHIATRIC:  patient is lethargic and very sleepy SKIN: No obvious rash, lesion, or ulcer.   LABORATORY PANEL:  CBC Recent Labs  Lab 09/24/17 0448  WBC 10.7*  HGB 9.9*  HCT 28.1*  PLT 322    Chemistries  Recent Labs  Lab 09/22/17 2112 09/23/17 0425 09/24/17 0448  NA 140 146* 151*  K 3.4* 3.3* 3.1*  CL 98 101 112*  CO2 18* 25 25  GLUCOSE 279* 227* 246*  BUN 94* 80* 47*  CREATININE 9.18* 7.89* 4.18*  CALCIUM 8.1* 8.2* 8.3*  MG 2.2  --   --   AST  --  12*  --   ALT  --  12  --   ALKPHOS  --  80  --   BILITOT  --  1.7*  --    Cardiac Enzymes Recent Labs   Lab 09/22/17 2112  TROPONINI 0.09*   RADIOLOGY:  Dg Chest Port 1 View  Result Date: 09/23/2017 CLINICAL DATA:  Acute respiratory failure EXAM: PORTABLE CHEST 1 VIEW COMPARISON:  09/22/2017 FINDINGS: Cardiac shadow is stable. The lungs are mildly hyperinflated. Previously seen nodular pattern is again identified but significantly improved when compare with the prior exam. Continued follow-up is recommended. Degenerative changes of the thoracolumbar spine are noted. IMPRESSION: Persistent but improved nodular pattern when compared with the prior exam. Electronically Signed   By: Inez Catalina M.D.   On: 09/23/2017 08:27   Ir Nephrostomy Placement Left  Result Date: 09/22/2017 INDICATION: 74 year old with acute kidney injury, sepsis and bilateral hydronephrosis. Patient needs decompression of both kidneys. EXAM: PLACEMENT OF BILATERAL NEPHROSTOMY TUBES WITH ULTRASOUND AND FLUOROSCOPIC GUIDANCE COMPARISON:  None. MEDICATIONS: Ciprofloxacin 400 mg; The antibiotic was administered in an appropriate time frame prior to skin puncture. ANESTHESIA/SEDATION: Fentanyl 50 mcg IV; Versed 2 mg IV Moderate Sedation Time:  40 minutes The patient was continuously monitored during the procedure by the interventional radiology nurse under my direct supervision. CONTRAST:  10 mL-administered into the collecting system(s) FLUOROSCOPY TIME:  Fluoroscopy Time: 3 minutes and 30 seconds, 25 mGy COMPLICATIONS: None immediate. PROCEDURE: Informed written consent was obtained from the patient's sister after a thorough discussion of the procedural risks, benefits  and alternatives. All questions were addressed. Maximal Sterile Barrier Technique was utilized including caps, mask, sterile gowns, sterile gloves, sterile drape, hand hygiene and skin antiseptic. A timeout was performed prior to the initiation of the procedure. Patient was placed prone. Both flanks were prepped and draped in sterile fashion. Procedure was technically  difficult due to tachypnea and patient restlessness. Left kidney was identified with ultrasound. The left flank was anesthetized with 1% lidocaine. 21 gauge needle directed into a lower pole calyx with ultrasound guidance. Small amount of contrast was injected to confirm placement in the renal collecting system. 0.018 wire was advanced into the renal collecting system and Accustick dilator set was placed. J wire was advanced down the ureter and the tract was dilated to accommodate a 10 [place] multipurpose drain. 10 French drain was reconstituted in the renal pelvis. Catheter was sutured to skin and attached to gravity bag. Attention was directed to the right kidney. Right kidney is more difficult to evaluate with ultrasound. Right flank was anesthetized with 1% lidocaine. 21 gauge needle was directed into a dilated midpole calyx with ultrasound guidance. Needle position confirmed within the collecting system with ultrasound. A 0.018 wire advanced easily down the ureter. An Accustick dilator set was placed. J wire was advanced down the ureter and the Accustick dilator set was replaced with a 10 [place] dilator. Subsequently, a 81 French drain was advanced over the wire and reconstituted in the right renal pelvis. It is difficult to reconstitute the pigtail drain in the renal pelvis to the small size of the collecting system. Catheter was attached to a gravity bag and sutured to the skin. Fluoroscopic and ultrasound images were taken and saved for documentation. FINDINGS: Mild to moderate bilateral hydronephrosis. Nephrostomy tubes are positioned in the renal pelvis bilaterally. IMPRESSION: Successful placement of bilateral percutaneous nephrostomy tubes with ultrasound and fluoroscopic guidance. Electronically Signed   By: Markus Daft M.D.   On: 09/22/2017 15:43   ASSESSMENT AND PLAN:  [name]  is a 67 y.o. male brought in by family secondary to altered mental status.  The patient has a history of stroke and  has trouble speaking his words and has baseline right-sided weakness.  He states his appetite has been poor for the past week.  In the ER, his creatinine was found to be 14.94 with a potassium of 6.1.   1.  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  bladder scan showing only 80 cc in the bladder.  IV fluid hydration   Likely acute kidney injury from poor appetite and obstructive uropathy.  Hold nephrotoxic medications. -Patient irecieved urgent hemodialysis through right femoral HD Access--Now on hold -Seen by urology. Patient is s/p  bilateral nephrectomy tube secondary to hydronephrosis with possible obstructive mass in the bladder as opposed to hemorrhagic debris--will need cystoscopy with bladder mass biopsy per Dr Erlene Quan -creat 14.0--9.18--7.89--4.14 -UC no growth  2.  Hyperkalemia.  Likely from acute kidney injury.  IV fluid hydration.  Monitor on telemetry.  pt got a dose of lokelma and IV calcium. -improved  3.  Type 1 diabetes mellitus.  Will need a lower dose glargine insulin plus sliding scale.  Watch closely for hypoglycemia with the acute kidney injury.  4.  History of atrial fibrillation.  With his creatinine clearance being where it is the sotalol is contraindicated.    -recievedIV heparin drip and continue his propranolol. -currently in SR -defer long term anticoagulation to Dr khan--Ok to d/c heparin gtt and  monitor  5.  History of stroke with right-sided weakness.  Physical therapy consultation.  6.  Essential hypertension on propranolol.  Hold benazepril.  7.  Hyperlipidemia unspecified.  On low-dose Crestor.  CPK normal range  8.  Falls.  Physical therapy evaluation    Overall pt improving slowly  Case discussed with Care Management/Social Worker. Management plans discussed with the patient, family and they are in agreement.  CODE STATUS: full  DVT Prophylaxis: heparin  TOTAL TIME TAKING CARE OF THIS PATIENT: 30*  minutes.  >50% time spent on counselling and coordination of care  POSSIBLE D/C IN *few* DAYS, DEPENDING ON CLINICAL CONDITION.  Note: This dictation was prepared with Dragon dictation along with smaller phrase technology. Any transcriptional errors that result from this process are unintentional.  Fritzi Mandes M.D on 09/24/2017 at 3:05 PM  Between 7am to 6pm - Pager - [phone]  After 6pm go to [email or web address] - password [email or web address] [facility]  Office  [phone]  CC: Primary care physician; Albina Billet, MDPatient ID: Donalee Citrin Whistler, male   DOB: 08-17-1950, 67 y.o.   MRN: [id number]

## 2017-09-24 NOTE — Progress Notes (Addendum)
Inpatient Diabetes Program Recommendations  AACE/ADA: New Consensus Statement on Inpatient Glycemic Control (2019)  Target Ranges:  Prepandial:   less than 140 mg/dL      Peak postprandial:   less than 180 mg/dL (1-2 hours)      Critically ill patients:  140 - 180 mg/dL   Results for [name], [name] (MRN [id number]) as of 09/24/2017 08:09  Ref. Range 09/23/2017 04:18 09/23/2017 11:48 09/23/2017 16:50 09/23/2017 21:17 09/24/2017 00:16 09/24/2017 04:16 09/24/2017 07:53  Glucose-Capillary Latest Ref Range: 70 - 99 mg/dL 215 (H)  Novolog 5 units 368 (H)  Novolog 15 units 281 (H)  Novolog 8 units 319 (H)  Lantus 20 units 301 (H)  Novolog 11 units 216 (H)  Novolog 5 units 172 (H)  Results for [name], [name] (MRN [id number]) as of 09/24/2017 08:09  Ref. Range 09/21/2017 22:35  Hemoglobin A1C Latest Ref Range: 4.8 - 5.6 % 7.2 (H)   Review of Glycemic Control  Diabetes history: DM1 (makes no insulin; requires basal, meal coverage, and correction insulin) Outpatient Diabetes medications: Lantus 28 units daily, Humalog for correction and meal coverage (per office note by Dr. Gabriel Carina on 06/05/17 should be taking Humalog 10 units with breakfast, 8 units with lunch, 12 units with supper, 4 units with HS snack, and 1 unit drops glucose 50 mg/dl) Current orders for Inpatient glycemic control: Lantus 20 units QHS, Novolog 0-15 units Q6H  Inpatient Diabetes Program Recommendations:  Insulin - Basal: Please consider increasing Lantus to 25 units QHS. Insulin-Correction: If patient is eating, may want to consider changing CBGs and Novolog correction to ACHS. Insulin-Meal Coverage: Please consider ordering Novolog 6 units TID with meals if patient eats at least 50% of meals. HgbA1C: A1C 7.2% on 09/21/17 indicating an average glucose of 160 mg/dl over the past 2-3 months.  Thanks, Barnie Alderman, RN, MSN, CDE Diabetes Coordinator Inpatient Diabetes Program [phone] (Team Pager from 8am to 5pm)

## 2017-09-24 NOTE — Progress Notes (Signed)
Transfer from 260 pt went into A-fib w/ RVR (HR 130's-160's) per Rn reporting. Pt requiring transfer back to Stepdown unit on Cardizem gtt.  Upon arrival to ICU, pt converted to NSR, Pt with hx of  CVA, atrial fibrillation, colon cancer status post resection and acute renal failure. Pt has two nephro tube to bilateral lower abd to gravity drainage. Foley to gravity free of kinks. VSS. Pt speech is garbled and mumbling words, yes to all questions. arouse to speech, alert only to person.  Pupils PERRLA 3 mm brisk bilaterally.  Pt will not follow Commands.  Pt oriented to room and call light.

## 2017-09-24 NOTE — Progress Notes (Signed)
   09/24/17 2336  Vitals  Pulse Rate (!) 45  ECG Heart Rate (!) 45  Cardiac Rhythm NSR  Resp 16  Oxygen Therapy  SpO2 96 %   Pt bradycardic while in NSR.  Pt rhythm back in form between brady in NSR and A-fib with RVR.  Paused cardizem.

## 2017-09-24 NOTE — Progress Notes (Signed)
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

## 2017-09-24 NOTE — Progress Notes (Signed)
Spoke with Dr Humphrey Rolls concerning continued a-fib with RVR.  Stated to administer 0.25mg  Digoxin IV x1.  Increase cardizem gtt to 20mg /hr.  May increase "beyond" the 20mg /hr.

## 2017-09-24 NOTE — Progress Notes (Signed)
Pt will be started on Cardizem gtt. Bed has been requested for pt to be transferred to step-down unit due to staffing issues.

## 2017-09-24 NOTE — Progress Notes (Signed)
SLP Cancellation Note  Patient Details Name: Jordan [name] MRN: [id number] DOB: Apr 04, 1950   Cancelled treatment:       Reason Eval/Treat Not Completed: Fatigue/lethargy limiting ability to participate(chart reviewed; consulted NSG re: pt's status). NSG reported pt tolerated po's (of the dysphagia diet recommended) at breakfast this morning w/out difficulty. She reported pt continues to be drowsy and would benefit from continuing w/ current recommended diet at this time for safe oral intake. ST services will f/u tomorrow w/ ongoing assessment. NSG agreed.    Orinda Kenner, MS, CCC-SLP Watson,Katherine 09/24/2017, 4:44 PM

## 2017-09-24 NOTE — Progress Notes (Signed)
eLink Physician-Brief Progress Note Patient Name: Jordan [name] DOB: 01-Jun-1950 MRN: [id number]   Date of Service  09/24/2017  HPI/Events of Note  Mag, Phosh and K are low in association with arrythmia  eICU Interventions  Will replace lytes  Advised nurse to titrate cardizem accordingly Will need mor input from cardiology as patient is allergic to amiodarone as per nurse        Intervention Category Major Interventions: Arrhythmia - evaluation and management  Oswin Johal 09/24/2017, 11:57 PM

## 2017-09-24 NOTE — Progress Notes (Signed)
   09/24/17 2331  Vitals  BP 126/78  MAP (mmHg) 93  Pulse Rate (!) 49  ECG Heart Rate (!) 39  Resp 19  Oxygen Therapy  SpO2 95 %   Pt noted have an episode of bracdycardia.  Decreased gtt.

## 2017-09-24 NOTE — Progress Notes (Addendum)
Follow up - Critical Care Medicine Note  Patient Details:    Jordan [name] is an 67 y.o. male. with a medical history of type 1 diabetes, previous CVA, atrial fibrillation, colon cancer status post resection, and hyperthyroidism who was admitted with acute renal failure, acute encephalopathy, and hyperkalemia.   Lines, Airways, Drains: Nephrostomy Left 10.2 Fr. (Active)  Dressing Status Clean;Dry;Intact 09/23/2017  7:35 AM  Dressing Type Tegaderm 09/23/2017  7:35 AM  Tube Status To gravity 09/23/2017  7:35 AM  Collection Container Leg bag 09/23/2017  7:35 AM  Input (mL) 150 mL 09/22/2017 10:57 PM  Output (mL) 200 mL 09/23/2017  8:22 AM     Nephrostomy Right 10.2 Fr. (Active)  Dressing Status Clean;Dry;Intact 09/23/2017  7:35 AM  Dressing Type Tegaderm 09/23/2017  7:35 AM  Tube Status To gravity 09/23/2017  7:35 AM  Collection Container Leg bag 09/23/2017  7:35 AM  Input (mL) 175 mL 09/22/2017 10:57 PM  Output (mL) 200 mL 09/23/2017  8:22 AM     Urethral Catheter  (Active)  Indication for Insertion or Continuance of Catheter Acute urinary retention;Unstable critical patients (first 24-48 hours) 09/23/2017  7:35 AM  Site Assessment Clean;Intact;Dry 09/23/2017  7:35 AM  Catheter Maintenance Bag below level of bladder;Catheter secured;Drainage bag/tubing not touching floor;Insertion date on drainage bag;No dependent loops;Seal intact 09/23/2017  7:35 AM  Collection Container Standard drainage bag 09/23/2017  7:35 AM  Securement Method Securing device (Describe) 09/23/2017  7:35 AM  Urinary Catheter Interventions Unclamped 09/23/2017  2:00 AM  Output (mL) 35 mL 09/23/2017  5:00 AM    Anti-infectives:  Anti-infectives (From admission, onward)   Start     Dose/Rate Route Frequency Ordered Stop   09/22/17 1430  ciprofloxacin (CIPRO) IVPB 400 mg     400 mg 200 mL/hr over 60 Minutes Intravenous  Once 09/22/17 1421 09/22/17 1721   09/22/17 1415  ciprofloxacin (CIPRO) IVPB 400 mg  Status:  Discontinued      400 mg 200 mL/hr over 60 Minutes Intravenous Every 12 hours 09/22/17 1414 09/22/17 1421   09/22/17 0600  cefTRIAXone (ROCEPHIN) 1 g in sodium chloride 0.9 % 100 mL IVPB  Status:  Discontinued     1 g 200 mL/hr over 30 Minutes Intravenous Every 24 hours 09/21/17 1549 09/22/17 0354   09/22/17 0354  cefTRIAXone (ROCEPHIN) 2 g in sodium chloride 0.9 % 100 mL IVPB  Status:  Discontinued     2 g 200 mL/hr over 30 Minutes Intravenous Every 24 hours 09/22/17 0354 09/23/17 1117   09/21/17 1430  vancomycin (VANCOCIN) IVPB 1000 mg/200 mL premix     1,000 mg 200 mL/hr over 60 Minutes Intravenous  Once 09/21/17 1429 09/21/17 1644   09/21/17 1430  piperacillin-tazobactam (ZOSYN) IVPB 3.375 g     3.375 g 100 mL/hr over 30 Minutes Intravenous  Once 09/21/17 1429 09/21/17 1543      Microbiology: Results for orders placed or performed during the hospital encounter of 09/21/17  Urine culture     Status: None   Collection Time: 09/21/17  2:31 PM  Result Value Ref Range Status   Specimen Description   Final    URINE, RANDOM Performed at [facility], [address], [place], [place] [address]    Special Requests   Final    NONE Performed at [facility], [address] [place], [place] [address]    Culture   Final    NO GROWTH Performed at [facility], [place]  [address] [place], [place] [address]    Report Status 09/22/2017 FINAL  Final  Blood culture (routine x 2)     Status: None (Preliminary result)   Collection Time: 09/21/17  2:50 PM  Result Value Ref Range Status   Specimen Description BLOOD Blood Culture adequate volume  Final   Special Requests   Final    BOTTLES DRAWN AEROBIC AND ANAEROBIC BLOOD RIGHT ARM   Culture   Final    NO GROWTH 2 DAYS Performed at [facility], [address] [place], [place] [address]    Report Status PENDING  Incomplete  Blood culture (routine x 2)     Status: None (Preliminary result)   Collection Time: 09/21/17   2:50 PM  Result Value Ref Range Status   Specimen Description BLOOD BLOOD LEFT HAND  Final   Special Requests BOTTLES DRAWN AEROBIC AND ANAEROBIC  Final   Culture   Final    NO GROWTH 2 DAYS Performed at [facility], [address], [place], [place] [address]    Report Status PENDING  Incomplete  MRSA PCR Screening     Status: None   Collection Time: 09/22/17  7:22 AM  Result Value Ref Range Status   MRSA by PCR NEGATIVE NEGATIVE Final    Comment:        The GeneXpert MRSA Assay (FDA approved for NASAL specimens only), is one component of a comprehensive MRSA colonization surveillance program. It is not intended to diagnose MRSA infection nor to guide or monitor treatment for MRSA infections. Performed at [facility], [address] [place], [place] [address]    Studies: Ct Abdomen Pelvis Wo Contrast  Result Date: 09/22/2017 CLINICAL DATA:  Acute renal failure. EXAM: CT ABDOMEN AND PELVIS WITHOUT CONTRAST TECHNIQUE: Multidetector CT imaging of the abdomen and pelvis was performed following the standard protocol without IV contrast. COMPARISON:  06/28/2011 FINDINGS: Lower chest: Small bilateral pleural effusions with associated atelectasis. Calcified plaque over the right coronary artery. Hepatobiliary: Gallbladder is contracted. Liver and biliary tree are normal. Pancreas: Normal. Spleen: Normal. Adrenals/Urinary Tract: Adrenal glands are normal. Kidneys are normal in size with symmetric bilateral mild hydroureteronephrosis. No renal stones. 1.4 cm cyst over the upper pole right kidney. Foley catheter is present within a decompressed bladder as there is increased density to the fluid within the bladder possibly representing hemorrhagic debris in may be partially responsible for the hydroureteronephrosis. Stomach/Bowel: Stomach and small bowel are within normal. Appendix is not visualized. Evidence of patient's previous partial right colectomy. Vascular/Lymphatic:  Minimal calcified plaque over the abdominal aorta. No adenopathy. Reproductive: Within normal. Other: Left femoral venous catheter is present with tip over the left external iliac pain near the junction to the common iliac vein. There is intermediate density material over the left pelvis which crosses midline at and just above the level of the seminal vesicles. This likely represents hemorrhagic debris. This encompasses the distal left ureter and is also adjacent the distal right ureter lesser degree and may be partially responsible for the bilateral hydroureteronephrosis. Musculoskeletal: Degenerative change of the spine and hips. IMPRESSION: Bilateral symmetric mild hydroureteronephrosis. Foley catheter present within a collapsed bladder as there is increased density material within the bladder possibly hemorrhagic debris. There is also increased density material over the left pelvis with some crossing midline adjacent both distal ureters left worse than right likely hemorrhagic debris. These factors may be responsible for the hydroureteronephrosis. Small bilateral pleural effusions with associated bibasilar atelectasis. 1.4 cm right  renal cyst. Previous partial right colectomy. Left femoral venous catheter with tip over the external iliac vein near the junction to common iliac vein. Electronically Signed   By: Marin Olp M.D.   On: 09/22/2017 09:58   Dg Chest 1 View  Result Date: 09/21/2017 CLINICAL DATA:  Altered mental status. EXAM: CHEST  1 VIEW COMPARISON:  03/19/2014 FINDINGS: Lungs are adequately inflated and otherwise clear. Cardiomediastinal silhouette and remainder the exam is unchanged. IMPRESSION: No active disease. Electronically Signed   By: Marin Olp M.D.   On: 09/21/2017 14:54   Ct Head Wo Contrast  Result Date: 09/21/2017 CLINICAL DATA:  Altered mental status.  Previous CVA EXAM: CT HEAD WITHOUT CONTRAST TECHNIQUE: Contiguous axial images were obtained from the base of the skull  through the vertex without intravenous contrast. COMPARISON:  Jun 10, 2012 FINDINGS: Brain: There is mild diffuse atrophy. There is a degree of ex vacuo phenomenon involving the anterior horn of the left lateral ventricle. There is no intracranial mass, hemorrhage, extra-axial fluid collection, or midline shift. There is evidence of a prior infarct involving portions of the superior, anterior left temporal lobe as well as much of the anterior left basal ganglia. Elsewhere there is mild small vessel disease in the centra semiovale bilaterally. No acute infarct is demonstrated on this study. Vascular: There is no evident hyperdense vessel. There is no appreciable vascular calcification. Skull: The bony calvarium  appears intact. Sinuses/Orbits: There is mucosal thickening in several ethmoid air cells. Other visualized paranasal sinuses are clear. Orbits appear symmetric bilaterally. Other: Mastoid air cells are clear. IMPRESSION: Mild atrophy. Prior infarct involving much of the left basal ganglia, particularly anteriorly, as well as a portion of the superior left temporal lobe anteriorly. Elsewhere there is mild periventricular small vessel disease. No acute infarct is evident. No mass or hemorrhage. There is mucosal thickening in several ethmoid air cells. Electronically Signed   By: Lowella Grip III M.D.   On: 09/21/2017 14:17   US Renal  Result Date: 09/21/2017 CLINICAL DATA:  67 y/o M; acute kidney injury. History of colon cancer. EXAM: RENAL / URINARY TRACT ULTRASOUND COMPLETE COMPARISON:  None. FINDINGS: Right Kidney: Length: 12.0. Mildly increased cortical echogenicity. Right kidney upper pole 1.5 cm and mid pole 1.8 cm simple cysts. Moderate hydronephrosis. Left Kidney: Length: 11.9. Mild increased cortical echogenicity. No mass. Moderate hydronephrosis. Bladder: Thickened bladder wall to 1.7 cm. IMPRESSION: 1. Moderate bilateral hydronephrosis. 2. Mild increased echogenicity of the renal cortices  compatible with medical renal disease. 3. Bladder wall thickening may reflect cystitis or sequelae of chronic outflow obstruction and underdistention. Electronically Signed   By: Kristine Garbe M.D.   On: 09/21/2017 18:25   Dg Chest Port 1 View  Result Date: 09/23/2017 CLINICAL DATA:  Acute respiratory failure EXAM: PORTABLE CHEST 1 VIEW COMPARISON:  09/22/2017 FINDINGS: Cardiac shadow is stable. The lungs are mildly hyperinflated. Previously seen nodular pattern is again identified but significantly improved when compare with the prior exam. Continued follow-up is recommended. Degenerative changes of the thoracolumbar spine are noted. IMPRESSION: Persistent but improved nodular pattern when compared with the prior exam. Electronically Signed   By: Inez Catalina M.D.   On: 09/23/2017 08:27   Dg Chest Port 1 View  Result Date: 09/22/2017 CLINICAL DATA:  Dyspnea and tachycardia. EXAM: PORTABLE CHEST 1 VIEW COMPARISON:  09/21/2017 FINDINGS: Heart size and pulmonary vascularity are normal. Since previous study, there is interval development of diffuse nodular perihilar infiltration bilaterally with some peribronchial thickening.  This likely represents bronchopneumonia although edema or atypical infectious process could also have this appearance. No blunting of costophrenic angles. No pneumothorax. Mediastinal contours appear intact. IMPRESSION: Interval development of diffuse bilateral nodular perihilar infiltrates with peribronchial thickening suggesting bronchopneumonia or edema. Electronically Signed   By: Lucienne Capers M.D.   On: 09/22/2017 04:29   Ir Nephrostomy Placement Left  Result Date: 09/22/2017 INDICATION: 77 year old with acute kidney injury, sepsis and bilateral hydronephrosis. Patient needs decompression of both kidneys. EXAM: PLACEMENT OF BILATERAL NEPHROSTOMY TUBES WITH ULTRASOUND AND FLUOROSCOPIC GUIDANCE COMPARISON:  None. MEDICATIONS: Ciprofloxacin 400 mg; The antibiotic was  administered in an appropriate time frame prior to skin puncture. ANESTHESIA/SEDATION: Fentanyl 50 mcg IV; Versed 2 mg IV Moderate Sedation Time:  40 minutes The patient was continuously monitored during the procedure by the interventional radiology nurse under my direct supervision. CONTRAST:  10 mL-administered into the collecting system(s) FLUOROSCOPY TIME:  Fluoroscopy Time: 3 minutes and 30 seconds, 25 mGy COMPLICATIONS: None immediate. PROCEDURE: Informed written consent was obtained from the patient's sister after a thorough discussion of the procedural risks, benefits and alternatives. All questions were addressed. Maximal Sterile Barrier Technique was utilized including caps, mask, sterile gowns, sterile gloves, sterile drape, hand hygiene and skin antiseptic. A timeout was performed prior to the initiation of the procedure. Patient was placed prone. Both flanks were prepped and draped in sterile fashion. Procedure was technically difficult due to tachypnea and patient restlessness. Left kidney was identified with ultrasound. The left flank was anesthetized with 1% lidocaine. 21 gauge needle directed into a lower pole calyx with ultrasound guidance. Small amount of contrast was injected to confirm placement in the renal collecting system. 0.018 wire was advanced into the renal collecting system and Accustick dilator set was placed. J wire was advanced down the ureter and the tract was dilated to accommodate a 10 [place] multipurpose drain. 10 French drain was reconstituted in the renal pelvis. Catheter was sutured to skin and attached to gravity bag. Attention was directed to the right kidney. Right kidney is more difficult to evaluate with ultrasound. Right flank was anesthetized with 1% lidocaine. 21 gauge needle was directed into a dilated midpole calyx with ultrasound guidance. Needle position confirmed within the collecting system with ultrasound. A 0.018 wire advanced easily down the ureter. An  Accustick dilator set was placed. J wire was advanced down the ureter and the Accustick dilator set was replaced with a 10 [place] dilator. Subsequently, a 86 French drain was advanced over the wire and reconstituted in the right renal pelvis. It is difficult to reconstitute the pigtail drain in the renal pelvis to the small size of the collecting system. Catheter was attached to a gravity bag and sutured to the skin. Fluoroscopic and ultrasound images were taken and saved for documentation. FINDINGS: Mild to moderate bilateral hydronephrosis. Nephrostomy tubes are positioned in the renal pelvis bilaterally. IMPRESSION: Successful placement of bilateral percutaneous nephrostomy tubes with ultrasound and fluoroscopic guidance. Electronically Signed   By: Markus Daft M.D.   On: 09/22/2017 15:43   Ir Nephrostomy Placement Right  Result Date: 09/22/2017 INDICATION: 89 year old with acute kidney injury, sepsis and bilateral hydronephrosis. Patient needs decompression of both kidneys. EXAM: PLACEMENT OF BILATERAL NEPHROSTOMY TUBES WITH ULTRASOUND AND FLUOROSCOPIC GUIDANCE COMPARISON:  None. MEDICATIONS: Ciprofloxacin 400 mg; The antibiotic was administered in an appropriate time frame prior to skin puncture. ANESTHESIA/SEDATION: Fentanyl 50 mcg IV; Versed 2 mg IV Moderate Sedation Time:  40 minutes The patient was continuously monitored during the  procedure by the interventional radiology nurse under my direct supervision. CONTRAST:  10 mL-administered into the collecting system(s) FLUOROSCOPY TIME:  Fluoroscopy Time: 3 minutes and 30 seconds, 25 mGy COMPLICATIONS: None immediate. PROCEDURE: Informed written consent was obtained from the patient's sister after a thorough discussion of the procedural risks, benefits and alternatives. All questions were addressed. Maximal Sterile Barrier Technique was utilized including caps, mask, sterile gowns, sterile gloves, sterile drape, hand hygiene and skin antiseptic. A timeout  was performed prior to the initiation of the procedure. Patient was placed prone. Both flanks were prepped and draped in sterile fashion. Procedure was technically difficult due to tachypnea and patient restlessness. Left kidney was identified with ultrasound. The left flank was anesthetized with 1% lidocaine. 21 gauge needle directed into a lower pole calyx with ultrasound guidance. Small amount of contrast was injected to confirm placement in the renal collecting system. 0.018 wire was advanced into the renal collecting system and Accustick dilator set was placed. J wire was advanced down the ureter and the tract was dilated to accommodate a 10 [place] multipurpose drain. 10 French drain was reconstituted in the renal pelvis. Catheter was sutured to skin and attached to gravity bag. Attention was directed to the right kidney. Right kidney is more difficult to evaluate with ultrasound. Right flank was anesthetized with 1% lidocaine. 21 gauge needle was directed into a dilated midpole calyx with ultrasound guidance. Needle position confirmed within the collecting system with ultrasound. A 0.018 wire advanced easily down the ureter. An Accustick dilator set was placed. J wire was advanced down the ureter and the Accustick dilator set was replaced with a 10 [place] dilator. Subsequently, a 45 French drain was advanced over the wire and reconstituted in the right renal pelvis. It is difficult to reconstitute the pigtail drain in the renal pelvis to the small size of the collecting system. Catheter was attached to a gravity bag and sutured to the skin. Fluoroscopic and ultrasound images were taken and saved for documentation. FINDINGS: Mild to moderate bilateral hydronephrosis. Nephrostomy tubes are positioned in the renal pelvis bilaterally. IMPRESSION: Successful placement of bilateral percutaneous nephrostomy tubes with ultrasound and fluoroscopic guidance. Electronically Signed   By: Markus Daft M.D.   On: 09/22/2017  15:43    Consults: Treatment Team:  Murlean Iba, MD Irine Seal, MD Dionisio David, MD   Subjective:    Overnight Issues: Pt went into A-fib w/ RVR (HR 130's-150's) requiring transfer back to [facility] unit for initiation of Cardizem gtt.  Upon arrival to ICU, pt back in NSR. Denies chest pain, palpitations, or Shortness of Breath  Objective:  Vital signs for last 24 hours: Temp:  [97.5 F (36.4 C)-98.6 F (37 C)] 98.2 F (36.8 C) (08/20 0350) Pulse Rate:  [55-139] 106 (08/20 0418) Resp:  [15-18] 18 (08/20 0314) BP: (100-138)/(45-72) 116/60 (08/20 0418) SpO2:  [93 %-100 %] 94 % (08/20 0418) Weight:  [79.6 kg] 79.6 kg (08/19 1454)  Hemodynamic parameters for last 24 hours:    Intake/Output from previous day: 08/19 0701 - 08/20 0700 In: 1298.7 [I.V.:661.2; IV Piggyback:637.5] Out: 4782 [Urine:5130]  Intake/Output this shift: Total I/O In: -  Out: 2330 [Urine:2330]  Vent settings for last 24 hours:    Physical Exam:  Vital signs: Please see the above listed vital signs HEENT: Atraumatic, normocephalic, neck supple, no JVD Cardiovascular:  RRR, s1s2, no M/R/G, 2+ pulses throughout  Pulmonary: Clear bilaterally, even, nonlabored, normal effort Abdominal: Soft, nontender, nondistended, BS+ x3 Extremities: No edema,  no clubbing Skin:                Warm/dry.  No obvious rashes, lesions, or ulcerations. Neuro:             Awake, alert only to person.  Pupils PERRLA 3 mm brisk bilaterally.  Pt will not follow                            Commands, only mumbles yes (not a change from previous report)  Assessment/Plan:   Renal failure>>improving Hypokalemia Hypernatremia  -Nephrology & Urology following, appreciate input -HD per Nephrology  -S/p percutaneous decompression -Monitor I&O's / urinary output -Follow BMP -Ensure adequate renal perfusion -Avoid nephrotoxic agents as able -Replace electrolytes as indicated -Encourage PO water intake -Change NS to 1/2NS  @ 100 ml/hr -Will give 40 mEq IV potassium replacement 09/24/17   Leukocytosis>>trending down -Monitor fever curve -Trend WBC -Follow cultures -Rocephin completed 8/19 (Received Zosyn 8/17>8/17, Vancomycin 8/17>8/17, Rocephin 8/18>8/19)  Anemia.  No evidence of active bleeding  -Monitor for s/sx of bleeding -Trend CBC -Heparin SQ for VTE prophylaxis -Transfuse for Hgb<7  Hyperglycemia. -CBG's -SSI & Lantus  A-Fib w/ RVR  -Cardizem infusion (Has adverse reaction to amiodarone) -Metoprolol 2.5 mg IV given on 2A -Digoxin given on 2A -Cardiology consulted, appreciate input    Darel Hong, [facility] Medicine Pager: [phone]  09/24/2017

## 2017-09-24 NOTE — Progress Notes (Signed)
Report given to Emma

## 2017-09-24 NOTE — Progress Notes (Signed)
eLink Physician-Brief Progress Note Patient Name: Jordan [name] DOB: 05/27/1950 MRN: [id number]   Date of Service  09/24/2017  HPI/Events of Note  Patient transfers to SDU for rate control of his A fib Started diltiazem  eICU Interventions       Intervention Category Evaluation Type: New Patient Evaluation  Collene Gobble 09/24/2017, 6:09 AM

## 2017-09-24 NOTE — Progress Notes (Signed)
eLink Physician-Brief Progress Note Patient Name: Jordan [name] DOB: 12/11/1950 MRN: [id number]   Date of Service  09/24/2017  HPI/Events of Note  Patient with AFIB with RVR Did NOT get oral meds this afternoon too sleepy  eICU Interventions  advsied nurse to give oral meds and assess rate, will NOT start cardizem infusion at this time     Intervention Category Major Interventions: Arrhythmia - evaluation and management  Stanely Sexson 09/24/2017, 8:09 PM

## 2017-09-24 NOTE — Progress Notes (Signed)
Called for RVR, sustained HR 130s-150s. Renal failure, Amiodarone allergy. BP borderline. Tried Metop 2.5 IV (low dose 2/2 BP), minimal response. Half dose Digoxin load ordered. Will try to restart on Cardizem gtt whilst pt still has sufficient pressure. Xfer to [employer] Case d/w ICU eLink.  -P. Brigid Vandekamp (Nocturnist/Hospitalist)

## 2017-09-24 NOTE — Progress Notes (Signed)
Spoke to Dr Mortimer Fries concerning pt's alternating bradycardic wth A-fib with RVR.  Also noted recent BMP.  Orders received.

## 2017-09-24 NOTE — Progress Notes (Signed)
MD made aware of Pt's HR in the 130-150's, up to 170;s at times, not sustaining. Orders for Metoprolol and Digoxin will be placed by MD.

## 2017-09-24 NOTE — Progress Notes (Signed)
[facility]  ROUNDING NOTE   Subjective:  Patient seen at bedside in CCU.  Cr down to 4.   Good urine outpt noted.  Urine output was 5.6 L over the preceding 24 hours. Serum sodium high at 151. Potassium also low at 3.1.  States   Objective:  Vital signs in last 24 hours:  Temp:  [97.5 F (36.4 C)-98.6 F (37 C)] 98.4 F (36.9 C) (08/20 0610) Pulse Rate:  [55-140] 65 (08/20 0800) Resp:  [15-19] 19 (08/20 0800) BP: (96-146)/(45-89) 146/64 (08/20 0800) SpO2:  [93 %-99 %] 98 % (08/20 0800) Weight:  [77 kg-79.6 kg] 77 kg (08/20 0610)  Weight change:  Filed Weights   09/21/17 1405 09/23/17 1454 09/24/17 0610  Weight: 82.1 kg 79.6 kg 77 kg    Intake/Output: I/O last 3 completed shifts: In: 4991.1 [I.V.:3928.6; Other:325; IV Piggyback:737.5] Out: 9250 [Urine:9250]   Intake/Output this shift:  Total I/O In: -  Out: 450 [Urine:450]  Physical Exam: General: No acute distress, laying in bed  Head: Normocephalic, atraumatic. Moist oral mucosal membranes  Eyes: Anicteric  Neck: Supple, trachea midline  Lungs:  Clear to auscultation, normal effort  Heart: S1S2 no rubs  Abdomen:  Soft, nontender, bowel sounds present  Extremities: No peripheral edema.  Neurologic: Awake, alert  Skin: No lesions       Basic Metabolic Panel: Recent Labs  Lab 09/21/17 2235 09/22/17 0415 09/22/17 2112 09/23/17 0425 09/24/17 0448  NA 137 136 140 146* 151*  K 5.2* 4.7 3.4* 3.3* 3.1*  CL 108 107 98 101 112*  CO2 10* 9* 18* 25 25  GLUCOSE 256* 223* 279* 227* 246*  BUN 132* 122* 94* 80* 47*  CREATININE 14.03* 13.72* 9.18* 7.89* 4.18*  CALCIUM 8.1* 8.2* 8.1* 8.2* 8.3*  MG  --  2.5* 2.2  --   --   PHOS  --  8.7* 6.7*  --   --     Liver Function Tests: Recent Labs  Lab 09/21/17 1342 09/22/17 0415 09/23/17 0425  AST 16 12* 12*  ALT 18 15 12   ALKPHOS 92 70 80  BILITOT 2.3* 1.7* 1.7*  PROT 7.5 6.1* 6.0*  ALBUMIN 3.2* 2.6* 2.3*   No results for input(s): LIPASE,  AMYLASE in the last 168 hours. No results for input(s): AMMONIA in the last 168 hours.  CBC: Recent Labs  Lab 09/21/17 1342 09/22/17 0415 09/23/17 0425 09/24/17 0448  WBC 32.3* 25.4* 13.4* 10.7*  NEUTROABS 30.4* 23.0*  --   --   HGB 10.2* 9.5* 9.3* 9.9*  HCT 30.8* 27.8* 27.0* 28.1*  MCV 89.8 87.6 85.9 87.6  PLT 538* 412 363 322    Cardiac Enzymes: Recent Labs  Lab 09/21/17 1342 09/22/17 0415 09/22/17 2112  CKTOTAL 129  --   --   TROPONINI <0.03 0.06* 0.09*    BNP: Invalid input(s): POCBNP  CBG: Recent Labs  Lab 09/23/17 1650 09/23/17 2117 09/24/17 0016 09/24/17 0416 09/24/17 0753  GLUCAP 281* 319* 301* 216* 172*    Microbiology: Results for orders placed or performed during the hospital encounter of 09/21/17  Urine culture     Status: None   Collection Time: 09/21/17  2:31 PM  Result Value Ref Range Status   Specimen Description   Final    URINE, RANDOM Performed at [facility], [address], [place], [place] [address]    Special Requests   Final    NONE Performed at [facility], [address] [place], [place]  [address]    Culture   Final    NO GROWTH Performed at [facility], [place] [address] [place], [place] [address]    Report Status 09/22/2017 FINAL  Final  Blood culture (routine x 2)     Status: None (Preliminary result)   Collection Time: 09/21/17  2:50 PM  Result Value Ref Range Status   Specimen Description BLOOD Blood Culture adequate volume  Final   Special Requests   Final    BOTTLES DRAWN AEROBIC AND ANAEROBIC BLOOD RIGHT ARM   Culture   Final    NO GROWTH 3 DAYS Performed at [facility], [address] [place], [place] [address]    Report Status PENDING  Incomplete  Blood culture (routine x 2)     Status: None (Preliminary result)   Collection Time: 09/21/17  2:50 PM  Result Value Ref Range Status   Specimen Description BLOOD BLOOD LEFT HAND  Final   Special Requests BOTTLES DRAWN  AEROBIC AND ANAEROBIC  Final   Culture   Final    NO GROWTH 3 DAYS Performed at [facility], [address] [place], [place] [address]    Report Status PENDING  Incomplete  MRSA PCR Screening     Status: None   Collection Time: 09/22/17  7:22 AM  Result Value Ref Range Status   MRSA by PCR NEGATIVE NEGATIVE Final    Comment:        The GeneXpert MRSA Assay (FDA approved for NASAL specimens only), is one component of a comprehensive MRSA colonization surveillance program. It is not intended to diagnose MRSA infection nor to guide or monitor treatment for MRSA infections. Performed at [facility], [address] [place], [place] [address]     Coagulation Studies: Recent Labs    09/21/17 1553 09/22/17 1224 09/23/17 1226  LABPROT 17.1* 15.1 15.6*  INR 1.41 1.20 1.25    Urinalysis: Recent Labs    09/21/17 1342  COLORURINE YELLOW*  LABSPEC 1.008  PHURINE 5.0  GLUCOSEU >=500*  HGBUR MODERATE*  BILIRUBINUR NEGATIVE  KETONESUR 20*  PROTEINUR NEGATIVE  NITRITE NEGATIVE  LEUKOCYTESUR MODERATE*      Imaging: Ct Abdomen Pelvis Wo Contrast  Result Date: 09/22/2017 CLINICAL DATA:  Acute renal failure. EXAM: CT ABDOMEN AND PELVIS WITHOUT CONTRAST TECHNIQUE: Multidetector CT imaging of the abdomen and pelvis was performed following the standard protocol without IV contrast. COMPARISON:  06/28/2011 FINDINGS: Lower chest: Small bilateral pleural effusions with associated atelectasis. Calcified plaque over the right coronary artery. Hepatobiliary: Gallbladder is contracted. Liver and biliary tree are normal. Pancreas: Normal. Spleen: Normal. Adrenals/Urinary Tract: Adrenal glands are normal. Kidneys are normal in size with symmetric bilateral mild hydroureteronephrosis. No renal stones. 1.4 cm cyst over the upper pole right kidney. Foley catheter is present within a decompressed bladder as there is increased density to the fluid within the bladder possibly  representing hemorrhagic debris in may be partially responsible for the hydroureteronephrosis. Stomach/Bowel: Stomach and small bowel are within normal. Appendix is not visualized. Evidence of patient's previous partial right colectomy. Vascular/Lymphatic: Minimal calcified plaque over the abdominal aorta. No adenopathy. Reproductive: Within normal. Other: Left femoral venous catheter is present with tip over the left external iliac pain near the junction to the common iliac vein. There is intermediate density material over the left pelvis which crosses midline at and just above the level of the seminal vesicles. This likely represents hemorrhagic debris. This encompasses the distal left ureter and is also adjacent  the distal right ureter lesser degree and may be partially responsible for the bilateral hydroureteronephrosis. Musculoskeletal: Degenerative change of the spine and hips. IMPRESSION: Bilateral symmetric mild hydroureteronephrosis. Foley catheter present within a collapsed bladder as there is increased density material within the bladder possibly hemorrhagic debris. There is also increased density material over the left pelvis with some crossing midline adjacent both distal ureters left worse than right likely hemorrhagic debris. These factors may be responsible for the hydroureteronephrosis. Small bilateral pleural effusions with associated bibasilar atelectasis. 1.4 cm right renal cyst. Previous partial right colectomy. Left femoral venous catheter with tip over the external iliac vein near the junction to common iliac vein. Electronically Signed   By: Marin Olp M.D.   On: 09/22/2017 09:58   Dg Chest Port 1 View  Result Date: 09/23/2017 CLINICAL DATA:  Acute respiratory failure EXAM: PORTABLE CHEST 1 VIEW COMPARISON:  09/22/2017 FINDINGS: Cardiac shadow is stable. The lungs are mildly hyperinflated. Previously seen nodular pattern is again identified but significantly improved when compare with  the prior exam. Continued follow-up is recommended. Degenerative changes of the thoracolumbar spine are noted. IMPRESSION: Persistent but improved nodular pattern when compared with the prior exam. Electronically Signed   By: Inez Catalina M.D.   On: 09/23/2017 08:27   Ir Nephrostomy Placement Left  Result Date: 09/22/2017 INDICATION: 58 year old with acute kidney injury, sepsis and bilateral hydronephrosis. Patient needs decompression of both kidneys. EXAM: PLACEMENT OF BILATERAL NEPHROSTOMY TUBES WITH ULTRASOUND AND FLUOROSCOPIC GUIDANCE COMPARISON:  None. MEDICATIONS: Ciprofloxacin 400 mg; The antibiotic was administered in an appropriate time frame prior to skin puncture. ANESTHESIA/SEDATION: Fentanyl 50 mcg IV; Versed 2 mg IV Moderate Sedation Time:  40 minutes The patient was continuously monitored during the procedure by the interventional radiology nurse under my direct supervision. CONTRAST:  10 mL-administered into the collecting system(s) FLUOROSCOPY TIME:  Fluoroscopy Time: 3 minutes and 30 seconds, 25 mGy COMPLICATIONS: None immediate. PROCEDURE: Informed written consent was obtained from the patient's sister after a thorough discussion of the procedural risks, benefits and alternatives. All questions were addressed. Maximal Sterile Barrier Technique was utilized including caps, mask, sterile gowns, sterile gloves, sterile drape, hand hygiene and skin antiseptic. A timeout was performed prior to the initiation of the procedure. Patient was placed prone. Both flanks were prepped and draped in sterile fashion. Procedure was technically difficult due to tachypnea and patient restlessness. Left kidney was identified with ultrasound. The left flank was anesthetized with 1% lidocaine. 21 gauge needle directed into a lower pole calyx with ultrasound guidance. Small amount of contrast was injected to confirm placement in the renal collecting system. 0.018 wire was advanced into the renal collecting system  and Accustick dilator set was placed. J wire was advanced down the ureter and the tract was dilated to accommodate a 10 [place] multipurpose drain. 10 French drain was reconstituted in the renal pelvis. Catheter was sutured to skin and attached to gravity bag. Attention was directed to the right kidney. Right kidney is more difficult to evaluate with ultrasound. Right flank was anesthetized with 1% lidocaine. 21 gauge needle was directed into a dilated midpole calyx with ultrasound guidance. Needle position confirmed within the collecting system with ultrasound. A 0.018 wire advanced easily down the ureter. An Accustick dilator set was placed. J wire was advanced down the ureter and the Accustick dilator set was replaced with a 10 [place] dilator. Subsequently, a 82 French drain was advanced over the wire and reconstituted in the right renal pelvis. It  is difficult to reconstitute the pigtail drain in the renal pelvis to the small size of the collecting system. Catheter was attached to a gravity bag and sutured to the skin. Fluoroscopic and ultrasound images were taken and saved for documentation. FINDINGS: Mild to moderate bilateral hydronephrosis. Nephrostomy tubes are positioned in the renal pelvis bilaterally. IMPRESSION: Successful placement of bilateral percutaneous nephrostomy tubes with ultrasound and fluoroscopic guidance. Electronically Signed   By: Markus Daft M.D.   On: 09/22/2017 15:43   Ir Nephrostomy Placement Right  Result Date: 09/22/2017 INDICATION: 81 year old with acute kidney injury, sepsis and bilateral hydronephrosis. Patient needs decompression of both kidneys. EXAM: PLACEMENT OF BILATERAL NEPHROSTOMY TUBES WITH ULTRASOUND AND FLUOROSCOPIC GUIDANCE COMPARISON:  None. MEDICATIONS: Ciprofloxacin 400 mg; The antibiotic was administered in an appropriate time frame prior to skin puncture. ANESTHESIA/SEDATION: Fentanyl 50 mcg IV; Versed 2 mg IV Moderate Sedation Time:  40 minutes The patient was  continuously monitored during the procedure by the interventional radiology nurse under my direct supervision. CONTRAST:  10 mL-administered into the collecting system(s) FLUOROSCOPY TIME:  Fluoroscopy Time: 3 minutes and 30 seconds, 25 mGy COMPLICATIONS: None immediate. PROCEDURE: Informed written consent was obtained from the patient's sister after a thorough discussion of the procedural risks, benefits and alternatives. All questions were addressed. Maximal Sterile Barrier Technique was utilized including caps, mask, sterile gowns, sterile gloves, sterile drape, hand hygiene and skin antiseptic. A timeout was performed prior to the initiation of the procedure. Patient was placed prone. Both flanks were prepped and draped in sterile fashion. Procedure was technically difficult due to tachypnea and patient restlessness. Left kidney was identified with ultrasound. The left flank was anesthetized with 1% lidocaine. 21 gauge needle directed into a lower pole calyx with ultrasound guidance. Small amount of contrast was injected to confirm placement in the renal collecting system. 0.018 wire was advanced into the renal collecting system and Accustick dilator set was placed. J wire was advanced down the ureter and the tract was dilated to accommodate a 10 [place] multipurpose drain. 10 French drain was reconstituted in the renal pelvis. Catheter was sutured to skin and attached to gravity bag. Attention was directed to the right kidney. Right kidney is more difficult to evaluate with ultrasound. Right flank was anesthetized with 1% lidocaine. 21 gauge needle was directed into a dilated midpole calyx with ultrasound guidance. Needle position confirmed within the collecting system with ultrasound. A 0.018 wire advanced easily down the ureter. An Accustick dilator set was placed. J wire was advanced down the ureter and the Accustick dilator set was replaced with a 10 [place] dilator. Subsequently, a 66 French drain was  advanced over the wire and reconstituted in the right renal pelvis. It is difficult to reconstitute the pigtail drain in the renal pelvis to the small size of the collecting system. Catheter was attached to a gravity bag and sutured to the skin. Fluoroscopic and ultrasound images were taken and saved for documentation. FINDINGS: Mild to moderate bilateral hydronephrosis. Nephrostomy tubes are positioned in the renal pelvis bilaterally. IMPRESSION: Successful placement of bilateral percutaneous nephrostomy tubes with ultrasound and fluoroscopic guidance. Electronically Signed   By: Markus Daft M.D.   On: 09/22/2017 15:43     Medications:   . sodium chloride 100 mL/hr at 09/24/17 0618  . diltiazem (CARDIZEM) infusion Stopped (09/24/17 3614)  . famotidine (PEPCID) IV Stopped (09/24/17 0805)  . potassium chloride 10 mEq (09/24/17 0729)   . Chlorhexidine Gluconate Cloth  6 each Topical Q0600  .  digoxin  0.25 mg Intravenous Once   Followed by  . digoxin  0.125 mg Intravenous Once   Followed by  . [START ON 09/25/2017] digoxin  0.0625 mg Oral Daily  . diltiazem  60 mg Oral R6E  . folic acid  1 mg Oral Daily  . heparin injection (subcutaneous)  5,000 Units Subcutaneous Q8H  . insulin aspart  0-15 Units Subcutaneous Q6H  . insulin glargine  20 Units Subcutaneous QHS  . multivitamin with minerals  1 tablet Oral Daily  . propranolol  80 mg Oral TID  . rosuvastatin  5 mg Oral q1800  . tamsulosin  0.4 mg Oral QPC supper   acetaminophen **OR** acetaminophen, haloperidol lactate  Assessment/ Plan:  67 y.o. 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   Patient appears to have developed acute kidney injury from bilateral hydronephrosis and also  underlying possible sepsis and dehydration.  Outpatient medications included benazepril.  1.  Acute renal failure.  Baseline creatinine 1.1 from April 2019 -Good urine output from nephrostomies.  5.6 L was noted.  Serum sodium noted to be high.  We will discuss this below.  2.  Bilateral hydronephrosis and chronic bladder wall thickening.  Continue to monitor output from nephrostomies.  Otherwise plan as per urology.  3.  Severe acidosis Resolved with leaf of obstruction.  Now off of sodium bicarbonate drip.  4.    Hypokalemia.  Receiving intravenous potassium this a.m.  5.  Hypernatremia.  Patient sodium 151 this AM.   He has been switched to half-normal saline at 100 cc/h.   LOS: 3 Coley Kulikowski 8/20/20198:07 AM

## 2017-09-25 DIAGNOSIS — E876 Hypokalemia: Secondary | ICD-10-CM

## 2017-09-25 LAB — BASIC METABOLIC PANEL
ANION GAP: 10 (ref 5–15)
ANION GAP: 11 (ref 5–15)
BUN: 36 mg/dL — AB (ref 8–23)
BUN: 30 mg/dL — ABNORMAL HIGH (ref 8–23)
CALCIUM: 9.1 mg/dL (ref 8.9–10.3)
CHLORIDE: 113 mmol/L — AB (ref 98–111)
CHLORIDE: 113 mmol/L — AB (ref 98–111)
CO2: 28 mmol/L (ref 22–32)
CO2: 26 mmol/L (ref 22–32)
CREATININE: 2.64 mg/dL — AB (ref 0.61–1.24)
Calcium: 9 mg/dL (ref 8.9–10.3)
Creatinine, Ser: 3.28 mg/dL — ABNORMAL HIGH (ref 0.61–1.24)
GFR calc Af Amer: 21 mL/min — ABNORMAL LOW (ref >60)
GFR calc non Af Amer: 18 mL/min — ABNORMAL LOW (ref >60)
GFR calc non Af Amer: 24 mL/min — ABNORMAL LOW (ref >60)
GFR, EST AFRICAN AMERICAN: 27 mL/min — AB (ref >60)
GLUCOSE: 348 mg/dL — AB (ref 70–99)
Glucose, Bld: 353 mg/dL — ABNORMAL HIGH (ref 70–99)
POTASSIUM: 3.5 mmol/L (ref 3.5–5.1)
Potassium: 3.3 mmol/L — ABNORMAL LOW (ref 3.5–5.1)
SODIUM: 151 mmol/L — AB (ref 135–145)
Sodium: 150 mmol/L — ABNORMAL HIGH (ref 135–145)

## 2017-09-25 LAB — DIGOXIN LEVEL
Digoxin Level: 2.9 ng/mL (ref 0.8–2.0)
Digoxin Level: 1.4 ng/mL (ref 0.8–2.0)

## 2017-09-25 LAB — GLUCOSE, CAPILLARY
GLUCOSE-CAPILLARY: 414 mg/dL — AB (ref 70–99)
GLUCOSE-CAPILLARY: 316 mg/dL — AB (ref 70–99)
GLUCOSE-CAPILLARY: 259 mg/dL — AB (ref 70–99)
GLUCOSE-CAPILLARY: 197 mg/dL — AB (ref 70–99)
GLUCOSE-CAPILLARY: 168 mg/dL — AB (ref 70–99)
GLUCOSE-CAPILLARY: 133 mg/dL — AB (ref 70–99)
Glucose-Capillary: 115 mg/dL — ABNORMAL HIGH (ref 70–99)
Glucose-Capillary: 117 mg/dL — ABNORMAL HIGH (ref 70–99)
Glucose-Capillary: 262 mg/dL — ABNORMAL HIGH (ref 70–99)
Glucose-Capillary: 297 mg/dL — ABNORMAL HIGH (ref 70–99)
Glucose-Capillary: 329 mg/dL — ABNORMAL HIGH (ref 70–99)
Glucose-Capillary: 375 mg/dL — ABNORMAL HIGH (ref 70–99)
Glucose-Capillary: 396 mg/dL — ABNORMAL HIGH (ref 70–99)
Glucose-Capillary: 398 mg/dL — ABNORMAL HIGH (ref 70–99)
Glucose-Capillary: 486 mg/dL — ABNORMAL HIGH (ref 70–99)

## 2017-09-25 MED ORDER — SODIUM CHLORIDE 0.9 % IV SOLN
INTRAVENOUS | Status: DC
  Administered 2017-09-25: 3.4 [IU]/h via INTRAVENOUS
  Filled 2017-09-25: qty 1

## 2017-09-25 MED ORDER — DEXTROSE 5 % IV SOLN: INTRAVENOUS | Status: DC

## 2017-09-25 MED ORDER — INSULIN GLARGINE 100 UNIT/ML ~~LOC~~ SOLN
32.0000 [IU] | Freq: Every day | SUBCUTANEOUS | Status: DC
  Filled 2017-09-25: qty 0.32

## 2017-09-25 MED ORDER — DEXTROSE 5 % IV SOLN
INTRAVENOUS | Status: DC
  Administered 2017-09-25 (×2): via INTRAVENOUS

## 2017-09-25 MED ORDER — FAMOTIDINE 20 MG PO TABS
20.0000 mg | ORAL_TABLET | Freq: Every day | ORAL | Status: DC
  Administered 2017-09-26 – 2017-10-01 (×6): 20 mg via ORAL
  Filled 2017-09-25 (×6): qty 1

## 2017-09-25 MED ORDER — INSULIN GLARGINE 100 UNIT/ML ~~LOC~~ SOLN
7.0000 [IU] | Freq: Once | SUBCUTANEOUS | Status: AC
  Administered 2017-09-25: 7 [IU] via SUBCUTANEOUS
  Filled 2017-09-25: qty 0.07

## 2017-09-25 MED ORDER — INSULIN GLARGINE 100 UNIT/ML ~~LOC~~ SOLN
28.0000 [IU] | Freq: Every day | SUBCUTANEOUS | Status: DC
  Filled 2017-09-25: qty 0.28

## 2017-09-25 MED ORDER — INSULIN ASPART 100 UNIT/ML ~~LOC~~ SOLN
20.0000 [IU] | Freq: Once | SUBCUTANEOUS | Status: AC
  Administered 2017-09-25: 20 [IU] via SUBCUTANEOUS
  Filled 2017-09-25: qty 1

## 2017-09-25 MED ORDER — INSULIN ASPART 100 UNIT/ML ~~LOC~~ SOLN: 0.0000 [IU] | SUBCUTANEOUS | Status: DC

## 2017-09-25 MED ORDER — POTASSIUM CHLORIDE 20 MEQ PO PACK
40.0000 meq | PACK | Freq: Once | ORAL | Status: AC
  Administered 2017-09-25: 40 meq via ORAL
  Filled 2017-09-25: qty 2

## 2017-09-25 NOTE — Progress Notes (Signed)
OT Cancellation Note  Patient Details Name: Jordan [name] MRN: [id number] DOB: 07-12-1950   Cancelled Treatment:    Reason Eval/Treat Not Completed: Medical issues which prohibited therapy. Order received, chart reviewed. Pt noted with serum glucose of 348 this am. Most recent glucose value 414 at 12:26pm via finger stick. Pt is a Type I diabetic. Will hold OT evaluation until pt is more medically appropriate.   Jeni Salles, MPH, MS, OTR/L ascom [phone] 09/25/17, 1:03 PM

## 2017-09-25 NOTE — Progress Notes (Signed)
[place] [facility] [place] NAME: Jordan [name]    MR#:  [id number]  DATE OF BIRTH:  10-09-1950  SUBJECTIVE:   Patient very lethargic sleepy. No Family in the ICU Sugars up. Good UOP REVIEW OF SYSTEMS:   Review of Systems  Unable to perform ROS: Mental status change   DRUG ALLERGIES:   Allergies  Allergen Reactions  . Amiodarone     VITALS:  Blood pressure (!) 160/117, pulse 69, temperature (!) 97.4 F (36.3 C), temperature source Axillary, resp. rate 13, weight 74.5 kg, SpO2 99 %.  PHYSICAL EXAMINATION:   Physical Exam  GENERAL:  67 y.o.-year-old patient lying in the bed with no acute distress. Critically ill EYES: Pupils equal, round, reactive to light and accommodation. No scleral icterus. Extraocular muscles intact.  HEENT: Head atraumatic, normocephalic. Oropharynx and nasopharynx clear.  NECK:  Supple, no jugular venous distention. No thyroid enlargement, no tenderness.  LUNGS: Normal breath sounds bilaterally, no wheezing, rales, rhonchi. No use of accessory muscles of respiration.  CARDIOVASCULAR: S1, S2 normal. No murmurs, rubs, or gallops.  ABDOMEN: Soft, nontender, nondistended. Bowel sounds present. No organomegaly or mass. Foley+ EXTREMITIES: No cyanosis, clubbing or edema b/l.   Right groin HD access NEUROLOGIC: unable to assess due to mentation. Moves all extremities well   PSYCHIATRIC:  patient is lethargic and very sleepy SKIN: No obvious rash, lesion, or ulcer.   LABORATORY PANEL:  CBC Recent Labs  Lab 09/24/17 0448  WBC 10.7*  HGB 9.9*  HCT 28.1*  PLT 322    Chemistries  Recent Labs  Lab 09/23/17 0425  09/24/17 2308 09/25/17 0054  NA 146*   < > 151* 150*  K 3.3*   < > 3.4* 3.5  CL 101   < > 113* 113*  CO2 25   < > 24 26  GLUCOSE 227*   < > 338* 348*  BUN 80*   < > 37* 36*  CREATININE 7.89*   < > 3.39* 3.28*  CALCIUM 8.2*   < > 9.1 9.0  MG  --   --  1.8  --   AST 12*  --   --   --   ALT 12   --   --   --   ALKPHOS 80  --   --   --   BILITOT 1.7*  --   --   --    < > = values in this interval not displayed.   Cardiac Enzymes Recent Labs  Lab 09/22/17 2112  TROPONINI 0.09*   RADIOLOGY:  No results found. ASSESSMENT AND PLAN:  Jordan [name]  is a 67 y.o. male brought in by family secondary to altered mental status.  The patient has a history of stroke and has trouble speaking his words and has baseline right-sided weakness.  He states his appetite has been poor for the past week.  In the ER, his creatinine was found to be 14.94 with a potassium of 6.1.   1.  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  bladder scan showing only 80 cc in the bladder.  IV fluid hydration   Likely acute kidney injury from poor appetite and obstructive uropathy.  Hold nephrotoxic medications. -Patient irecieved urgent hemodialysis through right femoral HD Access--Now on hold -Seen by urology. Patient is s/p  bilateral nephrectomy tube secondary to hydronephrosis with possible obstructive mass in the bladder as opposed to hemorrhagic debris--will need cystoscopy with bladder  mass biopsy per Jordan Jordan [name] -creat 14.0--9.18--7.89--4.14--3.28 -UC no growth  2.  Hyperkalemia.  Likely from acute kidney injury. -  IV fluid hydration.  Monitor on telemetry.  pt got a dose of lokelma and IV calcium. -improved  3.  Type 1 diabetes mellitus.   -sugars going up. Insulin gtt to be started  4.  History of atrial fibrillation.  With his creatinine clearance being where it is the sotalol is contraindicated.    -continue his propranolol, cardizem and digoxin -currently in SR -defer long term anticoagulation according to Jordan [name]--d/ced heparin gtt and monitor  5.  History of stroke with right-sided weakness.  Physical therapy consultation.  6.  Essential hypertension on propranolol.  Hold benazepril.  7.  Hyperlipidemia unspecified.  On low-dose Crestor.  CPK normal  range  8.  Falls.  Physical therapy evaluation    Overall pt improving slowly  Case discussed with Care Management/Social Worker. Management plans discussed with the patient, family and they are in agreement.  CODE STATUS: full  DVT Prophylaxis: heparin  TOTAL TIME TAKING CARE OF THIS PATIENT: 30* minutes.  >50% time spent on counselling and coordination of care  POSSIBLE D/C IN *few* DAYS, DEPENDING ON CLINICAL CONDITION.  Note: This dictation was prepared with Dragon dictation along with smaller phrase technology. Any transcriptional errors that result from this process are unintentional.  Jordan [name] M.D on 09/25/2017 at 3:19 PM  Between 7am to 6pm - Pager - [phone]  After 6pm go to [email or web address] - password [email or web address] [facility]  Office  [phone]  CC: Primary care physician; Jordan [name], MDPatient ID: Jordan [name], male   DOB: [birth year], 67 y.o.   MRN: [id number]

## 2017-09-25 NOTE — Progress Notes (Signed)
SUBJECTIVE: Pt is resting comfortably. No chest pain or shortness of breath.    Vitals:   09/25/17 0615 09/25/17 0700 09/25/17 0800 09/25/17 0900  BP:  (!) 160/84 (!) 142/68 (!) 148/71  Pulse: (!) 58 68 (!) 54 62  Resp: 13 17 13 15   Temp:      TempSrc:      SpO2: 96% 96% 98% 97%  Weight:        Intake/Output Summary (Last 24 hours) at 09/25/2017 0906 Last data filed at 09/25/2017 0839 Gross per 24 hour  Intake 2603.15 ml  Output 5203 ml  Net -2599.85 ml    LABS: Basic Metabolic Panel: Recent Labs    09/22/17 2112  09/24/17 2308 09/25/17 0054  NA 140   < > 151* 150*  K 3.4*   < > 3.4* 3.5  CL 98   < > 113* 113*  CO2 18*   < > 24 26  GLUCOSE 279*   < > 338* 348*  BUN 94*   < > 37* 36*  CREATININE 9.18*   < > 3.39* 3.28*  CALCIUM 8.1*   < > 9.1 9.0  MG 2.2  --  1.8  --   PHOS 6.7*  --  2.8  --    < > = values in this interval not displayed.   Liver Function Tests: Recent Labs    09/23/17 0425  AST 12*  ALT 12  ALKPHOS 80  BILITOT 1.7*  PROT 6.0*  ALBUMIN 2.3*   No results for input(s): LIPASE, AMYLASE in the last 72 hours. CBC: Recent Labs    09/23/17 0425 09/24/17 0448  WBC 13.4* 10.7*  HGB 9.3* 9.9*  HCT 27.0* 28.1*  MCV 85.9 87.6  PLT 363 322   Cardiac Enzymes: Recent Labs    09/22/17 2112  TROPONINI 0.09*   BNP: Invalid input(s): POCBNP D-Dimer: No results for input(s): DDIMER in the last 72 hours. Hemoglobin A1C: No results for input(s): HGBA1C in the last 72 hours. Fasting Lipid Panel: No results for input(s): CHOL, HDL, LDLCALC, TRIG, CHOLHDL, LDLDIRECT in the last 72 hours. Thyroid Function Tests: No results for input(s): TSH, T4TOTAL, T3FREE, THYROIDAB in the last 72 hours.  Invalid input(s): FREET3 Anemia Panel: No results for input(s): VITAMINB12, FOLATE, FERRITIN, TIBC, IRON, RETICCTPCT in the last 72 hours.   PHYSICAL EXAM General: Pale, weak, alert but slightly lethargic HEENT:  Normocephalic and atramatic Neck:  No  JVD.  Lungs: Clear bilaterally to auscultation and percussion. Heart: HRRR . Normal S1 and S2 without gallops or murmurs.  Abdomen: Bowel sounds are positive, abdomen soft and non-tender  Msk:  Back normal, normal gait. Normal strength and tone for age. Extremities: No clubbing, cyanosis or edema.   Neuro: Alert and oriented X 3. Psych:  Alert but   TELEMETRY: NSR 63bpm  ASSESSMENT AND PLAN:  History of atrial fibrillation with RVR. Could not tolerate amiodarone so placed on Cardizem drip and digoxin, however heart rate dropped into the 30s.  Cardizem drip weaned off and heart rate and rhythm have stabilized in NSR 60bpm. Digoxin level was high at 2.9, will reorder digoxin level prior to evening dose. If>2.0 do not give evening digoxin dose.   Continue to monitor and titrate digoxin dose per levels to reach therapeutic range of 0.8-2.0. Continue oral Cardizem and titrate up if needed.    Active Problems:   Acute kidney injury ([place])    Jake Bathe, NP-C 09/25/2017 9:06 AM Cell: [phone]

## 2017-09-25 NOTE — Progress Notes (Signed)
Follow up - Critical Care Medicine Note  Patient Details:    Jordan [name] is an 67 y.o. male. with a medical history of type 1 diabetes, previous CVA, atrial fibrillation, colon cancer status post resection, and hyperthyroidism who was admitted with acute renal failure, acute encephalopathy, and hyperkalemia.   Lines, Airways, Drains: Nephrostomy Left 10.2 Fr. (Active)  Dressing Status Clean;Dry;Intact 09/23/2017  7:35 AM  Dressing Type Tegaderm 09/23/2017  7:35 AM  Tube Status To gravity 09/23/2017  7:35 AM  Collection Container Leg bag 09/23/2017  7:35 AM  Input (mL) 150 mL 09/22/2017 10:57 PM  Output (mL) 200 mL 09/23/2017  8:22 AM     Nephrostomy Right 10.2 Fr. (Active)  Dressing Status Clean;Dry;Intact 09/23/2017  7:35 AM  Dressing Type Tegaderm 09/23/2017  7:35 AM  Tube Status To gravity 09/23/2017  7:35 AM  Collection Container Leg bag 09/23/2017  7:35 AM  Input (mL) 175 mL 09/22/2017 10:57 PM  Output (mL) 200 mL 09/23/2017  8:22 AM     Urethral Catheter  (Active)  Indication for Insertion or Continuance of Catheter Acute urinary retention;Unstable critical patients (first 24-48 hours) 09/23/2017  7:35 AM  Site Assessment Clean;Intact;Dry 09/23/2017  7:35 AM  Catheter Maintenance Bag below level of bladder;Catheter secured;Drainage bag/tubing not touching floor;Insertion date on drainage bag;No dependent loops;Seal intact 09/23/2017  7:35 AM  Collection Container Standard drainage bag 09/23/2017  7:35 AM  Securement Method Securing device (Describe) 09/23/2017  7:35 AM  Urinary Catheter Interventions Unclamped 09/23/2017  2:00 AM  Output (mL) 35 mL 09/23/2017  5:00 AM    Anti-infectives:  Anti-infectives (From admission, onward)   Start     Dose/Rate Route Frequency Ordered Stop   09/22/17 1430  ciprofloxacin (CIPRO) IVPB 400 mg     400 mg 200 mL/hr over 60 Minutes Intravenous  Once 09/22/17 1421 09/22/17 1721   09/22/17 1415  ciprofloxacin (CIPRO) IVPB 400 mg  Status:  Discontinued      400 mg 200 mL/hr over 60 Minutes Intravenous Every 12 hours 09/22/17 1414 09/22/17 1421   09/22/17 0600  cefTRIAXone (ROCEPHIN) 1 g in sodium chloride 0.9 % 100 mL IVPB  Status:  Discontinued     1 g 200 mL/hr over 30 Minutes Intravenous Every 24 hours 09/21/17 1549 09/22/17 0354   09/22/17 0354  cefTRIAXone (ROCEPHIN) 2 g in sodium chloride 0.9 % 100 mL IVPB  Status:  Discontinued     2 g 200 mL/hr over 30 Minutes Intravenous Every 24 hours 09/22/17 0354 09/23/17 1117   09/21/17 1430  vancomycin (VANCOCIN) IVPB 1000 mg/200 mL premix     1,000 mg 200 mL/hr over 60 Minutes Intravenous  Once 09/21/17 1429 09/21/17 1644   09/21/17 1430  piperacillin-tazobactam (ZOSYN) IVPB 3.375 g     3.375 g 100 mL/hr over 30 Minutes Intravenous  Once 09/21/17 1429 09/21/17 1543      Microbiology: Results for orders placed or performed during the hospital encounter of 09/21/17  Urine culture     Status: None   Collection Time: 09/21/17  2:31 PM  Result Value Ref Range Status   Specimen Description   Final    URINE, RANDOM Performed at [facility], [address], [place], [place] [address]    Special Requests   Final    NONE Performed at [facility], [address] [place], [place] [address]    Culture   Final    NO GROWTH Performed at [facility], [place]  [address], [place], [place] [address]    Report Status 09/22/2017 FINAL  Final  Blood culture (routine x 2)     Status: None (Preliminary result)   Collection Time: 09/21/17  2:50 PM  Result Value Ref Range Status   Specimen Description BLOOD Blood Culture adequate volume  Final   Special Requests   Final    BOTTLES DRAWN AEROBIC AND ANAEROBIC BLOOD RIGHT ARM   Culture   Final    NO GROWTH 4 DAYS Performed at [facility], [address], [place], [place] [address]    Report Status PENDING  Incomplete  Blood culture (routine x 2)     Status: None (Preliminary result)   Collection Time: 09/21/17   2:50 PM  Result Value Ref Range Status   Specimen Description BLOOD BLOOD LEFT HAND  Final   Special Requests BOTTLES DRAWN AEROBIC AND ANAEROBIC  Final   Culture   Final    NO GROWTH 4 DAYS Performed at [facility], [address], [place], [place] [address]    Report Status PENDING  Incomplete  MRSA PCR Screening     Status: None   Collection Time: 09/22/17  7:22 AM  Result Value Ref Range Status   MRSA by PCR NEGATIVE NEGATIVE Final    Comment:        The GeneXpert MRSA Assay (FDA approved for NASAL specimens only), is one component of a comprehensive MRSA colonization surveillance program. It is not intended to diagnose MRSA infection nor to guide or monitor treatment for MRSA infections. Performed at [facility], [address], [place], [place] [address]    Studies: Ct Abdomen Pelvis Wo Contrast  Result Date: 09/22/2017 CLINICAL DATA:  Acute renal failure. EXAM: CT ABDOMEN AND PELVIS WITHOUT CONTRAST TECHNIQUE: Multidetector CT imaging of the abdomen and pelvis was performed following the standard protocol without IV contrast. COMPARISON:  06/28/2011 FINDINGS: Lower chest: Small bilateral pleural effusions with associated atelectasis. Calcified plaque over the right coronary artery. Hepatobiliary: Gallbladder is contracted. Liver and biliary tree are normal. Pancreas: Normal. Spleen: Normal. Adrenals/Urinary Tract: Adrenal glands are normal. Kidneys are normal in size with symmetric bilateral mild hydroureteronephrosis. No renal stones. 1.4 cm cyst over the upper pole right kidney. Foley catheter is present within a decompressed bladder as there is increased density to the fluid within the bladder possibly representing hemorrhagic debris in may be partially responsible for the hydroureteronephrosis. Stomach/Bowel: Stomach and small bowel are within normal. Appendix is not visualized. Evidence of patient's previous partial right colectomy. Vascular/Lymphatic:  Minimal calcified plaque over the abdominal aorta. No adenopathy. Reproductive: Within normal. Other: Left femoral venous catheter is present with tip over the left external iliac pain near the junction to the common iliac vein. There is intermediate density material over the left pelvis which crosses midline at and just above the level of the seminal vesicles. This likely represents hemorrhagic debris. This encompasses the distal left ureter and is also adjacent the distal right ureter lesser degree and may be partially responsible for the bilateral hydroureteronephrosis. Musculoskeletal: Degenerative change of the spine and hips. IMPRESSION: Bilateral symmetric mild hydroureteronephrosis. Foley catheter present within a collapsed bladder as there is increased density material within the bladder possibly hemorrhagic debris. There is also increased density material over the left pelvis with some crossing midline adjacent both distal ureters left worse than right likely hemorrhagic debris. These factors may be responsible for the hydroureteronephrosis. Small bilateral pleural effusions with associated bibasilar atelectasis. 1.4 cm right  renal cyst. Previous partial right colectomy. Left femoral venous catheter with tip over the external iliac vein near the junction to common iliac vein. Electronically Signed   By: Marin Olp M.D.   On: 09/22/2017 09:58   Dg Chest 1 View  Result Date: 09/21/2017 CLINICAL DATA:  Altered mental status. EXAM: CHEST  1 VIEW COMPARISON:  03/19/2014 FINDINGS: Lungs are adequately inflated and otherwise clear. Cardiomediastinal silhouette and remainder the exam is unchanged. IMPRESSION: No active disease. Electronically Signed   By: Marin Olp M.D.   On: 09/21/2017 14:54   Ct Head Wo Contrast  Result Date: 09/21/2017 CLINICAL DATA:  Altered mental status.  Previous CVA EXAM: CT HEAD WITHOUT CONTRAST TECHNIQUE: Contiguous axial images were obtained from the base of the skull  through the vertex without intravenous contrast. COMPARISON:  Jun 10, 2012 FINDINGS: Brain: There is mild diffuse atrophy. There is a degree of ex vacuo phenomenon involving the anterior horn of the left lateral ventricle. There is no intracranial mass, hemorrhage, extra-axial fluid collection, or midline shift. There is evidence of a prior infarct involving portions of the superior, anterior left temporal lobe as well as much of the anterior left basal ganglia. Elsewhere there is mild small vessel disease in the centra semiovale bilaterally. No acute infarct is demonstrated on this study. Vascular: There is no evident hyperdense vessel. There is no appreciable vascular calcification. Skull: The bony calvarium  appears intact. Sinuses/Orbits: There is mucosal thickening in several ethmoid air cells. Other visualized paranasal sinuses are clear. Orbits appear symmetric bilaterally. Other: Mastoid air cells are clear. IMPRESSION: Mild atrophy. Prior infarct involving much of the left basal ganglia, particularly anteriorly, as well as a portion of the superior left temporal lobe anteriorly. Elsewhere there is mild periventricular small vessel disease. No acute infarct is evident. No mass or hemorrhage. There is mucosal thickening in several ethmoid air cells. Electronically Signed   By: Lowella Grip III M.D.   On: 09/21/2017 14:17   US Renal  Result Date: 09/21/2017 CLINICAL DATA:  67 y/o M; acute kidney injury. History of colon cancer. EXAM: RENAL / URINARY TRACT ULTRASOUND COMPLETE COMPARISON:  None. FINDINGS: Right Kidney: Length: 12.0. Mildly increased cortical echogenicity. Right kidney upper pole 1.5 cm and mid pole 1.8 cm simple cysts. Moderate hydronephrosis. Left Kidney: Length: 11.9. Mild increased cortical echogenicity. No mass. Moderate hydronephrosis. Bladder: Thickened bladder wall to 1.7 cm. IMPRESSION: 1. Moderate bilateral hydronephrosis. 2. Mild increased echogenicity of the renal cortices  compatible with medical renal disease. 3. Bladder wall thickening may reflect cystitis or sequelae of chronic outflow obstruction and underdistention. Electronically Signed   By: Kristine Garbe M.D.   On: 09/21/2017 18:25   Dg Chest Port 1 View  Result Date: 09/23/2017 CLINICAL DATA:  Acute respiratory failure EXAM: PORTABLE CHEST 1 VIEW COMPARISON:  09/22/2017 FINDINGS: Cardiac shadow is stable. The lungs are mildly hyperinflated. Previously seen nodular pattern is again identified but significantly improved when compare with the prior exam. Continued follow-up is recommended. Degenerative changes of the thoracolumbar spine are noted. IMPRESSION: Persistent but improved nodular pattern when compared with the prior exam. Electronically Signed   By: Inez Catalina M.D.   On: 09/23/2017 08:27   Dg Chest Port 1 View  Result Date: 09/22/2017 CLINICAL DATA:  Dyspnea and tachycardia. EXAM: PORTABLE CHEST 1 VIEW COMPARISON:  09/21/2017 FINDINGS: Heart size and pulmonary vascularity are normal. Since previous study, there is interval development of diffuse nodular perihilar infiltration bilaterally with some peribronchial thickening.  This likely represents bronchopneumonia although edema or atypical infectious process could also have this appearance. No blunting of costophrenic angles. No pneumothorax. Mediastinal contours appear intact. IMPRESSION: Interval development of diffuse bilateral nodular perihilar infiltrates with peribronchial thickening suggesting bronchopneumonia or edema. Electronically Signed   By: Lucienne Capers M.D.   On: 09/22/2017 04:29   Ir Nephrostomy Placement Left  Result Date: 09/22/2017 INDICATION: 31 year old with acute kidney injury, sepsis and bilateral hydronephrosis. Patient needs decompression of both kidneys. EXAM: PLACEMENT OF BILATERAL NEPHROSTOMY TUBES WITH ULTRASOUND AND FLUOROSCOPIC GUIDANCE COMPARISON:  None. MEDICATIONS: Ciprofloxacin 400 mg; The antibiotic was  administered in an appropriate time frame prior to skin puncture. ANESTHESIA/SEDATION: Fentanyl 50 mcg IV; Versed 2 mg IV Moderate Sedation Time:  40 minutes The patient was continuously monitored during the procedure by the interventional radiology nurse under my direct supervision. CONTRAST:  10 mL-administered into the collecting system(s) FLUOROSCOPY TIME:  Fluoroscopy Time: 3 minutes and 30 seconds, 25 mGy COMPLICATIONS: None immediate. PROCEDURE: Informed written consent was obtained from the patient's sister after a thorough discussion of the procedural risks, benefits and alternatives. All questions were addressed. Maximal Sterile Barrier Technique was utilized including caps, mask, sterile gowns, sterile gloves, sterile drape, hand hygiene and skin antiseptic. A timeout was performed prior to the initiation of the procedure. Patient was placed prone. Both flanks were prepped and draped in sterile fashion. Procedure was technically difficult due to tachypnea and patient restlessness. Left kidney was identified with ultrasound. The left flank was anesthetized with 1% lidocaine. 21 gauge needle directed into a lower pole calyx with ultrasound guidance. Small amount of contrast was injected to confirm placement in the renal collecting system. 0.018 wire was advanced into the renal collecting system and Accustick dilator set was placed. J wire was advanced down the ureter and the tract was dilated to accommodate a 10 [place] multipurpose drain. 10 French drain was reconstituted in the renal pelvis. Catheter was sutured to skin and attached to gravity bag. Attention was directed to the right kidney. Right kidney is more difficult to evaluate with ultrasound. Right flank was anesthetized with 1% lidocaine. 21 gauge needle was directed into a dilated midpole calyx with ultrasound guidance. Needle position confirmed within the collecting system with ultrasound. A 0.018 wire advanced easily down the ureter. An  Accustick dilator set was placed. J wire was advanced down the ureter and the Accustick dilator set was replaced with a 10 [place] dilator. Subsequently, a 57 French drain was advanced over the wire and reconstituted in the right renal pelvis. It is difficult to reconstitute the pigtail drain in the renal pelvis to the small size of the collecting system. Catheter was attached to a gravity bag and sutured to the skin. Fluoroscopic and ultrasound images were taken and saved for documentation. FINDINGS: Mild to moderate bilateral hydronephrosis. Nephrostomy tubes are positioned in the renal pelvis bilaterally. IMPRESSION: Successful placement of bilateral percutaneous nephrostomy tubes with ultrasound and fluoroscopic guidance. Electronically Signed   By: Markus Daft M.D.   On: 09/22/2017 15:43   Ir Nephrostomy Placement Right  Result Date: 09/22/2017 INDICATION: 88 year old with acute kidney injury, sepsis and bilateral hydronephrosis. Patient needs decompression of both kidneys. EXAM: PLACEMENT OF BILATERAL NEPHROSTOMY TUBES WITH ULTRASOUND AND FLUOROSCOPIC GUIDANCE COMPARISON:  None. MEDICATIONS: Ciprofloxacin 400 mg; The antibiotic was administered in an appropriate time frame prior to skin puncture. ANESTHESIA/SEDATION: Fentanyl 50 mcg IV; Versed 2 mg IV Moderate Sedation Time:  40 minutes The patient was continuously monitored during the  procedure by the interventional radiology nurse under my direct supervision. CONTRAST:  10 mL-administered into the collecting system(s) FLUOROSCOPY TIME:  Fluoroscopy Time: 3 minutes and 30 seconds, 25 mGy COMPLICATIONS: None immediate. PROCEDURE: Informed written consent was obtained from the patient's sister after a thorough discussion of the procedural risks, benefits and alternatives. All questions were addressed. Maximal Sterile Barrier Technique was utilized including caps, mask, sterile gowns, sterile gloves, sterile drape, hand hygiene and skin antiseptic. A timeout  was performed prior to the initiation of the procedure. Patient was placed prone. Both flanks were prepped and draped in sterile fashion. Procedure was technically difficult due to tachypnea and patient restlessness. Left kidney was identified with ultrasound. The left flank was anesthetized with 1% lidocaine. 21 gauge needle directed into a lower pole calyx with ultrasound guidance. Small amount of contrast was injected to confirm placement in the renal collecting system. 0.018 wire was advanced into the renal collecting system and Accustick dilator set was placed. J wire was advanced down the ureter and the tract was dilated to accommodate a 10 [place] multipurpose drain. 10 French drain was reconstituted in the renal pelvis. Catheter was sutured to skin and attached to gravity bag. Attention was directed to the right kidney. Right kidney is more difficult to evaluate with ultrasound. Right flank was anesthetized with 1% lidocaine. 21 gauge needle was directed into a dilated midpole calyx with ultrasound guidance. Needle position confirmed within the collecting system with ultrasound. A 0.018 wire advanced easily down the ureter. An Accustick dilator set was placed. J wire was advanced down the ureter and the Accustick dilator set was replaced with a 10 [place] dilator. Subsequently, a 17 French drain was advanced over the wire and reconstituted in the right renal pelvis. It is difficult to reconstitute the pigtail drain in the renal pelvis to the small size of the collecting system. Catheter was attached to a gravity bag and sutured to the skin. Fluoroscopic and ultrasound images were taken and saved for documentation. FINDINGS: Mild to moderate bilateral hydronephrosis. Nephrostomy tubes are positioned in the renal pelvis bilaterally. IMPRESSION: Successful placement of bilateral percutaneous nephrostomy tubes with ultrasound and fluoroscopic guidance. Electronically Signed   By: Markus Daft M.D.   On: 09/22/2017  15:43    Consults: Treatment Team:  Murlean Iba, MD Irine Seal, MD Dionisio David, MD   Subjective:    Overnight Issues: patient had episode of RVR last night. Electrolytes were corrected. Presently converted in sinus rhythm with ulcer 62  Objective:  Vital signs for last 24 hours: Temp:  [97.7 F (36.5 C)-98.5 F (36.9 C)] 97.7 F (36.5 C) (08/21 0000) Pulse Rate:  [41-153] 62 (08/21 0900) Resp:  [11-21] 15 (08/21 0900) BP: (119-168)/(58-112) 148/71 (08/21 0900) SpO2:  [92 %-100 %] 97 % (08/21 0900) Weight:  [74.5 kg] 74.5 kg (08/21 0445)  Hemodynamic parameters for last 24 hours:    Intake/Output from previous day: 08/20 0701 - 08/21 0700 In: 2433.2 [P.O.:120; I.V.:1920.7; IV Piggyback:392.4] Out: 5278 [Urine:5278]  Intake/Output this shift: Total I/O In: 170 [IV Piggyback:170] Out: 375 [Urine:375]  Vent settings for last 24 hours:    Physical Exam:  Vital signs: Please see the above listed vital signs HEENT: Trachea is midline, no thyromegaly appreciated, no oral lesions noted Cardiovascular: Tachycardia with atrial fibrillation with ventricular response of 120 Pulmonary: Coarse rhonchi appreciated Abdominal: Positive bowel sounds, soft exam Extremities: No clubbing cyanosis or edema noted  Assessment/Plan:   Renal failure. Status post hemodialysis and percutaneous  decompression.   Hypernatremia. Will place on D5 W  Hypokalemia at 3.5 will increase to 4 secondary to elevated digoxin level  Magnesium is 1.8  Phosphorus 2.8  Leukocytosis of 13.4. Patient is empirically on Rocephin  Anemia. 9.3. No evidence of active bleeding  Hyperglycemia. On sliding scale coverage  Atrial Fibrillation. Patient has adverse reaction to amiodarone, is on a Cardizem infusion  Critical care time 40 minutes  Kathlyn Leachman 09/25/2017  *Care during the described time interval was provided by me and/or other providers on the critical care team.  I have reviewed  this patient's available data, including medical history, events of note, physical examination and test results as part of my evaluation. Patient ID: Jordan [name], male   DOB: 05/10/1950, 67 y.o.   MRN: [id number]

## 2017-09-25 NOTE — Progress Notes (Signed)
Inpatient Diabetes Program Recommendations  AACE/ADA: New Consensus Statement on Inpatient Glycemic Control (2019)  Target Ranges:  Prepandial:   less than 140 mg/dL      Peak postprandial:   less than 180 mg/dL (1-2 hours)      Critically ill patients:  140 - 180 mg/dL  Results for Jordan [name], Jordan [name] (MRN [id number]) as of 09/25/2017 09:20  Ref. Range 09/24/2017 07:53 09/24/2017 11:52 09/24/2017 17:16 09/24/2017 21:24 09/25/2017 00:38 09/25/2017 05:13 09/25/2017 07:40  Glucose-Capillary Latest Ref Range: 70 - 99 mg/dL 172 (H) 341 (H) 268 (H) 278 (H) 297 (H) 329 (H) 262 (H)    Review of Glycemic Control  Diabetes history:DM1 (makes no insulin; requires basal, meal coverage, and correction insulin) Outpatient Diabetes medications:Lantus 28 units daily, Humalog for correction and meal coverage (per office note by Dr. Gabriel Carina on 06/05/17 should be taking Humalog 10 units with breakfast, 8 units with lunch, 12 units with supper, 4 units with HS snack, and 1 unit drops glucose 50 mg/dl) Current orders for Inpatient glycemic control:Lantus 25 units QHS, Novolog 0-15 units Q6H  Inpatient Diabetes Program Recommendations: Insulin - Basal: Please consider increasing Lantus to28units QHS. Insulin-Correction: If patient is eating, please consider changing CBGs and Novolog correction to ACHS. Insulin-Meal Coverage: Please consider ordering Novolog 6 units TID with meals if patient eats at least 50% of meals. HgbA1C: A1C 7.2% on 09/21/17 indicating an average glucose of 160 mg/dl over the past 2-3 months.  Thanks, Barnie Alderman, RN, MSN, CDE Diabetes Coordinator Inpatient Diabetes Program [phone] (Team Pager from 8am to 5pm)

## 2017-09-25 NOTE — Progress Notes (Signed)
[facility]  ROUNDING NOTE   Subjective:  Creatinine currently down to 3.28. Serum sodium still high at 150. Urine output 5.2 L.   Objective:  Vital signs in last 24 hours:  Temp:  [97.7 F (36.5 C)-98.5 F (36.9 C)] 97.7 F (36.5 C) (08/21 0000) Pulse Rate:  [41-153] 62 (08/21 0900) Resp:  [11-21] 15 (08/21 0900) BP: (119-168)/(58-112) 148/71 (08/21 0900) SpO2:  [92 %-100 %] 97 % (08/21 0900) Weight:  [74.5 kg] 74.5 kg (08/21 0445)  Weight change: -5.1 kg Filed Weights   09/23/17 1454 09/24/17 0610 09/25/17 0445  Weight: 79.6 kg 77 kg 74.5 kg    Intake/Output: I/O last 3 completed shifts: In: 2542.1 [P.O.:120; I.V.:1929.7; IV Piggyback:492.4] Out: 8078 [Urine:8078]   Intake/Output this shift:  Total I/O In: 170 [IV Piggyback:170] Out: 375 [Urine:375]  Physical Exam: General: No acute distress, laying in bed  Head: Normocephalic, atraumatic. Moist oral mucosal membranes  Eyes: Anicteric  Neck: Supple, trachea midline  Lungs:  Clear to auscultation, normal effort  Heart: S1S2 no rubs  Abdomen:  Soft, nontender, bowel sounds present  Extremities: No peripheral edema.  Neurologic: Awake, alert  Skin: No lesions       Basic Metabolic Panel: Recent Labs  Lab 09/22/17 0415 09/22/17 2112 09/23/17 0425 09/24/17 0448 09/24/17 2308 09/25/17 0054  NA 136 140 146* 151* 151* 150*  K 4.7 3.4* 3.3* 3.1* 3.4* 3.5  CL 107 98 101 112* 113* 113*  CO2 9* 18* 25 25 24 26   GLUCOSE 223* 279* 227* 246* 338* 348*  BUN 122* 94* 80* 47* 37* 36*  CREATININE 13.72* 9.18* 7.89* 4.18* 3.39* 3.28*  CALCIUM 8.2* 8.1* 8.2* 8.3* 9.1 9.0  MG 2.5* 2.2  --   --  1.8  --   PHOS 8.7* 6.7*  --   --  2.8  --     Liver Function Tests: Recent Labs  Lab 09/21/17 1342 09/22/17 0415 09/23/17 0425  AST 16 12* 12*  ALT 18 15 12   ALKPHOS 92 70 80  BILITOT 2.3* 1.7* 1.7*  PROT 7.5 6.1* 6.0*  ALBUMIN 3.2* 2.6* 2.3*   No results for input(s): LIPASE, AMYLASE in the last  168 hours. No results for input(s): AMMONIA in the last 168 hours.  CBC: Recent Labs  Lab 09/21/17 1342 09/22/17 0415 09/23/17 0425 09/24/17 0448  WBC 32.3* 25.4* 13.4* 10.7*  NEUTROABS 30.4* 23.0*  --   --   HGB 10.2* 9.5* 9.3* 9.9*  HCT 30.8* 27.8* 27.0* 28.1*  MCV 89.8 87.6 85.9 87.6  PLT 538* 412 363 322    Cardiac Enzymes: Recent Labs  Lab 09/21/17 1342 09/22/17 0415 09/22/17 2112  CKTOTAL 129  --   --   TROPONINI <0.03 0.06* 0.09*    BNP: Invalid input(s): POCBNP  CBG: Recent Labs  Lab 09/24/17 1716 09/24/17 2124 09/25/17 0038 09/25/17 0513 09/25/17 0740  GLUCAP 268* 278* 297* 329* 25*    Microbiology: Results for orders placed or performed during the hospital encounter of 09/21/17  Urine culture     Status: None   Collection Time: 09/21/17  2:31 PM  Result Value Ref Range Status   Specimen Description   Final    URINE, RANDOM Performed at [facility], [address] [place], [place] [address]    Special Requests   Final    NONE Performed at [facility], [address], [place], [place] [address]    Culture   Final    NO  GROWTH Performed at [facility], [place] [address] [place], [place] [address]    Report Status 09/22/2017 FINAL  Final  Blood culture (routine x 2)     Status: None (Preliminary result)   Collection Time: 09/21/17  2:50 PM  Result Value Ref Range Status   Specimen Description BLOOD Blood Culture adequate volume  Final   Special Requests   Final    BOTTLES DRAWN AEROBIC AND ANAEROBIC BLOOD RIGHT ARM   Culture   Final    NO GROWTH 4 DAYS Performed at [facility], [address] [place], [place] [address]    Report Status PENDING  Incomplete  Blood culture (routine x 2)     Status: None (Preliminary result)   Collection Time: 09/21/17  2:50 PM  Result Value Ref Range Status   Specimen Description BLOOD BLOOD LEFT HAND  Final   Special Requests BOTTLES DRAWN AEROBIC AND ANAEROBIC   Final   Culture   Final    NO GROWTH 4 DAYS Performed at [facility], [address], [place], [place] [address]    Report Status PENDING  Incomplete  MRSA PCR Screening     Status: None   Collection Time: 09/22/17  7:22 AM  Result Value Ref Range Status   MRSA by PCR NEGATIVE NEGATIVE Final    Comment:        The GeneXpert MRSA Assay (FDA approved for NASAL specimens only), is one component of a comprehensive MRSA colonization surveillance program. It is not intended to diagnose MRSA infection nor to guide or monitor treatment for MRSA infections. Performed at [facility], [address] [place], [place] [address]     Coagulation Studies: Recent Labs    09/22/17 1224 09/23/17 1226  LABPROT 15.1 15.6*  INR 1.20 1.25    Urinalysis: No results for input(s): COLORURINE, LABSPEC, PHURINE, GLUCOSEU, HGBUR, BILIRUBINUR, KETONESUR, PROTEINUR, UROBILINOGEN, NITRITE, LEUKOCYTESUR in the last 72 hours.  Invalid input(s): APPERANCEUR    Imaging: No results found.   Medications:   . sodium chloride 75 mL/hr at 09/25/17 0457  . diltiazem (CARDIZEM) infusion Stopped (09/25/17 0010)  . famotidine (PEPCID) IV Stopped (09/24/17 0805)   . Chlorhexidine Gluconate Cloth  6 each Topical Q0600  . digoxin  0.0625 mg Oral Daily  . diltiazem  60 mg Oral Z0C  . folic acid  1 mg Oral Daily  . heparin injection (subcutaneous)  5,000 Units Subcutaneous Q8H  . insulin aspart  0-15 Units Subcutaneous Q6H  . insulin glargine  25 Units Subcutaneous QHS  . multivitamin with minerals  1 tablet Oral Daily  . propranolol  80 mg Oral TID  . rosuvastatin  5 mg Oral q1800  . tamsulosin  0.4 mg Oral QPC supper   acetaminophen **OR** acetaminophen, haloperidol lactate  Assessment/ Plan:  67 y.o. 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   Patient appears to have developed acute kidney injury from bilateral hydronephrosis and also underlying possible sepsis and dehydration.  Outpatient medications included benazepril.  1.  Acute renal failure.  Baseline creatinine 1.1 from April 2019 -Creatinine is trending down.  We will switch IV fluids from 0.9 normal saline to D5W.  2.  Bilateral hydronephrosis and chronic bladder wall thickening.  Good urine output from nephrostomies  noted.  We will attempt to keep up with his urine output.  3.  Severe acidosis Serum bicarbonate normalized at 26.  Continue to monitor..  4.    Hypokalemia.  After repletion yesterday serum potassium now up to 3.5.  5.  Hypernatremia.  Serum sodium still high at 150.  Patient was on 0.9 normal saline.  This will be stopped and we will transition the patient to D5W at 125 cc/h.   LOS: 4 Jordan [name] 8/21/20199:34 AM

## 2017-09-25 NOTE — Progress Notes (Signed)
PT Cancellation Note  Patient Details Name: Jordan [name] MRN: [id number] DOB: 1950/06/16   Cancelled Treatment:    Reason Eval/Treat Not Completed: Medical issues which prohibited therapy Order received, chart reviewed. Most recent glucose value 486 at 1:48pm. Pt is a Type I diabetic. Will re-attempt at later date/time as pt is available and medically appropriate.      Hortencia Conradi, SPT 09/25/17,1:54 PM

## 2017-09-26 ENCOUNTER — Inpatient Hospital Stay: Payer: Medicare Other

## 2017-09-26 DIAGNOSIS — Z8639 Personal history of other endocrine, nutritional and metabolic disease: Secondary | ICD-10-CM

## 2017-09-26 DIAGNOSIS — E870 Hyperosmolality and hypernatremia: Secondary | ICD-10-CM

## 2017-09-26 DIAGNOSIS — I480 Paroxysmal atrial fibrillation: Secondary | ICD-10-CM

## 2017-09-26 LAB — GLUCOSE, CAPILLARY
GLUCOSE-CAPILLARY: 182 mg/dL — AB (ref 70–99)
Glucose-Capillary: 501 mg/dL (ref 70–99)
Glucose-Capillary: 122 mg/dL — ABNORMAL HIGH (ref 70–99)
Glucose-Capillary: 137 mg/dL — ABNORMAL HIGH (ref 70–99)
Glucose-Capillary: 181 mg/dL — ABNORMAL HIGH (ref 70–99)
Glucose-Capillary: 172 mg/dL — ABNORMAL HIGH (ref 70–99)
Glucose-Capillary: 194 mg/dL — ABNORMAL HIGH (ref 70–99)
Glucose-Capillary: 269 mg/dL — ABNORMAL HIGH (ref 70–99)
Glucose-Capillary: 425 mg/dL — ABNORMAL HIGH (ref 70–99)
Glucose-Capillary: >600 mg/dL (ref 70–99)
Glucose-Capillary: 544 mg/dL (ref 70–99)
Glucose-Capillary: 462 mg/dL — ABNORMAL HIGH (ref 70–99)
Glucose-Capillary: 419 mg/dL — ABNORMAL HIGH (ref 70–99)
Glucose-Capillary: 455 mg/dL — ABNORMAL HIGH (ref 70–99)
Glucose-Capillary: 381 mg/dL — ABNORMAL HIGH (ref 70–99)
Glucose-Capillary: 251 mg/dL — ABNORMAL HIGH (ref 70–99)
Glucose-Capillary: 162 mg/dL — ABNORMAL HIGH (ref 70–99)

## 2017-09-26 LAB — CULTURE, BLOOD (ROUTINE X 2)
CULTURE: NO GROWTH
CULTURE: NO GROWTH
SPECIMEN DESCRIPTION: ADEQUATE

## 2017-09-26 LAB — COMPREHENSIVE METABOLIC PANEL
ALBUMIN: 2.8 g/dL — AB (ref 3.5–5.0)
ALT: 12 U/L (ref 0–44)
ANION GAP: 10 (ref 5–15)
AST: 20 U/L (ref 15–41)
Alkaline Phosphatase: 80 U/L (ref 38–126)
BILIRUBIN TOTAL: 1 mg/dL (ref 0.3–1.2)
BUN: 25 mg/dL — ABNORMAL HIGH (ref 8–23)
CO2: 27 mmol/L (ref 22–32)
Calcium: 8.9 mg/dL (ref 8.9–10.3)
Chloride: 115 mmol/L — ABNORMAL HIGH (ref 98–111)
Creatinine, Ser: 2.32 mg/dL — ABNORMAL HIGH (ref 0.61–1.24)
GFR calc Af Amer: 32 mL/min — ABNORMAL LOW (ref >60)
GFR calc non Af Amer: 28 mL/min — ABNORMAL LOW (ref >60)
Glucose, Bld: 211 mg/dL — ABNORMAL HIGH (ref 70–99)
POTASSIUM: 3.4 mmol/L — AB (ref 3.5–5.1)
SODIUM: 152 mmol/L — AB (ref 135–145)
TOTAL PROTEIN: 6.7 g/dL (ref 6.5–8.1)

## 2017-09-26 LAB — DIGOXIN LEVEL: Digoxin Level: 1.5 ng/mL (ref 0.8–2.0)

## 2017-09-26 LAB — CBC
HCT: 18.2 % — ABNORMAL LOW (ref 40.0–52.0)
Hemoglobin: 6.1 g/dL — ABNORMAL LOW (ref 13.0–18.0)
MCH: 30.3 pg (ref 26.0–34.0)
MCHC: 33.6 g/dL (ref 32.0–36.0)
MCV: 90.3 fL (ref 80.0–100.0)
PLATELETS: 194 10*3/uL (ref 150–440)
RBC: 2.01 MIL/uL — ABNORMAL LOW (ref 4.40–5.90)
RDW: 12.8 % (ref 11.5–14.5)
WBC: 7 10*3/uL (ref 3.8–10.6)

## 2017-09-26 LAB — PREPARE RBC (CROSSMATCH)

## 2017-09-26 LAB — HEMOGLOBIN AND HEMATOCRIT, BLOOD
HEMATOCRIT: 34.5 % — AB (ref 40.0–52.0)
HEMOGLOBIN: 11.7 g/dL — AB (ref 13.0–18.0)

## 2017-09-26 LAB — TROPONIN I
TROPONIN I: 0.05 ng/mL — AB (ref <0.03)
TROPONIN I: 0.04 ng/mL — AB (ref <0.03)
TROPONIN I: 0.03 ng/mL — AB (ref <0.03)

## 2017-09-26 LAB — PHOSPHORUS: Phosphorus: 2.2 mg/dL — ABNORMAL LOW (ref 2.5–4.6)

## 2017-09-26 LAB — ABO/RH: ABO/RH(D): A NEG

## 2017-09-26 LAB — MAGNESIUM: Magnesium: 1.9 mg/dL (ref 1.7–2.4)

## 2017-09-26 MED ORDER — INSULIN ASPART 100 UNIT/ML ~~LOC~~ SOLN
0.0000 [IU] | SUBCUTANEOUS | Status: DC
  Administered 2017-09-26: 8 [IU] via SUBCUTANEOUS
  Filled 2017-09-26: qty 1

## 2017-09-26 MED ORDER — METHIMAZOLE 10 MG PO TABS
10.0000 mg | ORAL_TABLET | Freq: Three times a day (TID) | ORAL | Status: DC
  Administered 2017-09-26 (×2): 10 mg via ORAL
  Filled 2017-09-26 (×3): qty 1

## 2017-09-26 MED ORDER — DIGOXIN 0.25 MG/ML IJ SOLN
0.1250 mg | Freq: Once | INTRAMUSCULAR | Status: AC
  Administered 2017-09-26: 0.125 mg via INTRAVENOUS
  Filled 2017-09-26: qty 2

## 2017-09-26 MED ORDER — SODIUM CHLORIDE 0.9 % IV SOLN
INTRAVENOUS | Status: DC
  Administered 2017-09-26: 5.4 [IU]/h via INTRAVENOUS
  Administered 2017-09-27: 7.1 [IU]/h via INTRAVENOUS
  Filled 2017-09-26 (×6): qty 1

## 2017-09-26 MED ORDER — METOPROLOL TARTRATE 5 MG/5ML IV SOLN
5.0000 mg | Freq: Once | INTRAVENOUS | Status: AC
  Administered 2017-09-26: 5 mg via INTRAVENOUS

## 2017-09-26 MED ORDER — K PHOS MONO-SOD PHOS DI & MONO 155-852-130 MG PO TABS
500.0000 mg | ORAL_TABLET | Freq: Two times a day (BID) | ORAL | Status: AC
  Administered 2017-09-26 (×2): 500 mg via ORAL
  Filled 2017-09-26 (×2): qty 2

## 2017-09-26 MED ORDER — DEXTROSE 5 % IV SOLN
INTRAVENOUS | Status: DC
  Administered 2017-09-26: via INTRAVENOUS

## 2017-09-26 MED ORDER — SODIUM CHLORIDE 0.9% IV SOLUTION
Freq: Once | INTRAVENOUS | Status: AC
  Administered 2017-09-26: 10:00:00 via INTRAVENOUS

## 2017-09-26 MED ORDER — INSULIN ASPART 100 UNIT/ML ~~LOC~~ SOLN: 0.0000 [IU] | Freq: Three times a day (TID) | SUBCUTANEOUS | Status: DC

## 2017-09-26 MED ORDER — AMIODARONE HCL 200 MG PO TABS
400.0000 mg | ORAL_TABLET | Freq: Two times a day (BID) | ORAL | Status: DC
  Administered 2017-09-26 – 2017-10-01 (×10): 400 mg via ORAL
  Filled 2017-09-26 (×11): qty 2

## 2017-09-26 MED ORDER — LACTATED RINGERS IV SOLN
INTRAVENOUS | Status: DC
  Administered 2017-09-26: 06:00:00 via INTRAVENOUS

## 2017-09-26 MED ORDER — DILTIAZEM HCL 100 MG IV SOLR
5.0000 mg/h | INTRAVENOUS | Status: DC
  Administered 2017-09-26: 5 mg/h via INTRAVENOUS
  Administered 2017-09-26 (×2): 20 mg/h via INTRAVENOUS
  Filled 2017-09-26 (×3): qty 100

## 2017-09-26 MED ORDER — METOPROLOL TARTRATE 5 MG/5ML IV SOLN
INTRAVENOUS | Status: AC
  Administered 2017-09-26: 5 mg via INTRAVENOUS
  Filled 2017-09-26: qty 5

## 2017-09-26 MED ORDER — INSULIN ASPART 100 UNIT/ML ~~LOC~~ SOLN: 0.0000 [IU] | Freq: Every day | SUBCUTANEOUS | Status: DC

## 2017-09-26 MED ORDER — POTASSIUM CHLORIDE CRYS ER 20 MEQ PO TBCR: 40.0000 meq | EXTENDED_RELEASE_TABLET | Freq: Once | ORAL | Status: DC

## 2017-09-26 MED ORDER — STERILE WATER FOR INJECTION IV SOLN
INTRAVENOUS | Status: DC
  Administered 2017-09-26 – 2017-09-27 (×4): via INTRAVENOUS
  Filled 2017-09-26 (×7): qty 9.71

## 2017-09-26 MED ORDER — METOPROLOL TARTRATE 5 MG/5ML IV SOLN
2.5000 mg | Freq: Four times a day (QID) | INTRAVENOUS | Status: DC | PRN
  Administered 2017-09-26: 5 mg via INTRAVENOUS
  Filled 2017-09-26: qty 5

## 2017-09-26 MED ORDER — INSULIN GLARGINE 100 UNIT/ML ~~LOC~~ SOLN
10.0000 [IU] | Freq: Every day | SUBCUTANEOUS | Status: DC
  Administered 2017-09-26: 10 [IU] via SUBCUTANEOUS
  Filled 2017-09-26: qty 0.1

## 2017-09-26 MED ORDER — LACTATED RINGERS IV BOLUS
250.0000 mL | Freq: Once | INTRAVENOUS | Status: AC
  Administered 2017-09-26: 250 mL via INTRAVENOUS

## 2017-09-26 MED ORDER — MAGNESIUM SULFATE IN D5W 1-5 GM/100ML-% IV SOLN
1.0000 g | Freq: Once | INTRAVENOUS | Status: AC
  Administered 2017-09-26: 1 g via INTRAVENOUS
  Filled 2017-09-26: qty 100

## 2017-09-26 MED ORDER — INSULIN GLARGINE 100 UNIT/ML ~~LOC~~ SOLN: 28.0000 [IU] | Freq: Every day | SUBCUTANEOUS | Status: DC

## 2017-09-26 MED ORDER — INSULIN GLARGINE 100 UNIT/ML ~~LOC~~ SOLN
18.0000 [IU] | Freq: Once | SUBCUTANEOUS | Status: AC
  Administered 2017-09-26: 18 [IU] via SUBCUTANEOUS
  Filled 2017-09-26: qty 0.18

## 2017-09-26 MED ORDER — SODIUM PHOSPHATES 45 MMOLE/15ML IV SOLN
20.0000 mmol | Freq: Once | INTRAVENOUS | Status: DC
  Filled 2017-09-26: qty 6.67

## 2017-09-26 MED ORDER — INSULIN GLARGINE 100 UNIT/ML ~~LOC~~ SOLN
18.0000 [IU] | Freq: Every day | SUBCUTANEOUS | Status: DC
  Filled 2017-09-26: qty 0.18

## 2017-09-26 MED ORDER — POTASSIUM CHLORIDE 20 MEQ PO PACK
PACK | ORAL | Status: AC
  Administered 2017-09-26: 60 meq via ORAL
  Filled 2017-09-26: qty 2

## 2017-09-26 MED ORDER — POTASSIUM CHLORIDE 20 MEQ PO PACK
60.0000 meq | PACK | Freq: Once | ORAL | Status: AC
  Administered 2017-09-26 (×2): 60 meq via ORAL
  Filled 2017-09-26: qty 3

## 2017-09-26 MED ORDER — DEXTROSE 5 % IV SOLN
INTRAVENOUS | Status: DC
  Administered 2017-09-26: 08:00:00 via INTRAVENOUS

## 2017-09-26 MED ORDER — INSULIN ASPART 100 UNIT/ML ~~LOC~~ SOLN: 4.0000 [IU] | Freq: Three times a day (TID) | SUBCUTANEOUS | Status: DC

## 2017-09-26 NOTE — Progress Notes (Addendum)
Follow up - Critical Care Medicine Note  Pulmonary/critical care attending  I have personally seen and examined Jordan [name], reviewed revise and confirm nurse practitioner's note. I independently performed history, physical, reviewed all laboratory and imaging studies. Appreciate Dr. Humphrey Rolls. Patient is also being evaluated by Dr. Caryl Comes for possible ablation. Looking through notes unclear if it's a true amiodarone allergy as opposed to hyperthyroidism with increased iodine uptake. Consideration for blocking hormone formation with amiodarone infusion versus ablation. Pending cardiology's input  Hermelinda Dellen, D.O.      Patient Details:    Jordan [name] is an 67 y.o. male. with a medical history of type 1 diabetes, previous CVA, atrial fibrillation, colon cancer status post resection, and hyperthyroidism who was admitted with acute renal failure, acute encephalopathy, and hyperkalemia.   Lines, Airways, Drains: Nephrostomy Left 10.2 Fr. (Active)  Dressing Status Clean;Dry;Intact 09/23/2017  7:35 AM  Dressing Type Tegaderm 09/23/2017  7:35 AM  Tube Status To gravity 09/23/2017  7:35 AM  Collection Container Leg bag 09/23/2017  7:35 AM  Input (mL) 150 mL 09/22/2017 10:57 PM  Output (mL) 200 mL 09/23/2017  8:22 AM     Nephrostomy Right 10.2 Fr. (Active)  Dressing Status Clean;Dry;Intact 09/23/2017  7:35 AM  Dressing Type Tegaderm 09/23/2017  7:35 AM  Tube Status To gravity 09/23/2017  7:35 AM  Collection Container Leg bag 09/23/2017  7:35 AM  Input (mL) 175 mL 09/22/2017 10:57 PM  Output (mL) 200 mL 09/23/2017  8:22 AM     Urethral Catheter  (Active)  Indication for Insertion or Continuance of Catheter Acute urinary retention;Unstable critical patients (first 24-48 hours) 09/23/2017  7:35 AM  Site Assessment Clean;Intact;Dry 09/23/2017  7:35 AM  Catheter Maintenance Bag below level of bladder;Catheter secured;Drainage bag/tubing not touching floor;Insertion date on drainage bag;No dependent  loops;Seal intact 09/23/2017  7:35 AM  Collection Container Standard drainage bag 09/23/2017  7:35 AM  Securement Method Securing device (Describe) 09/23/2017  7:35 AM  Urinary Catheter Interventions Unclamped 09/23/2017  2:00 AM  Output (mL) 35 mL 09/23/2017  5:00 AM    Anti-infectives:  Anti-infectives (From admission, onward)   Start     Dose/Rate Route Frequency Ordered Stop   09/22/17 1430  ciprofloxacin (CIPRO) IVPB 400 mg     400 mg 200 mL/hr over 60 Minutes Intravenous  Once 09/22/17 1421 09/22/17 1721   09/22/17 1415  ciprofloxacin (CIPRO) IVPB 400 mg  Status:  Discontinued     400 mg 200 mL/hr over 60 Minutes Intravenous Every 12 hours 09/22/17 1414 09/22/17 1421   09/22/17 0600  cefTRIAXone (ROCEPHIN) 1 g in sodium chloride 0.9 % 100 mL IVPB  Status:  Discontinued     1 g 200 mL/hr over 30 Minutes Intravenous Every 24 hours 09/21/17 1549 09/22/17 0354   09/22/17 0354  cefTRIAXone (ROCEPHIN) 2 g in sodium chloride 0.9 % 100 mL IVPB  Status:  Discontinued     2 g 200 mL/hr over 30 Minutes Intravenous Every 24 hours 09/22/17 0354 09/23/17 1117   09/21/17 1430  vancomycin (VANCOCIN) IVPB 1000 mg/200 mL premix     1,000 mg 200 mL/hr over 60 Minutes Intravenous  Once 09/21/17 1429 09/21/17 1644   09/21/17 1430  piperacillin-tazobactam (ZOSYN) IVPB 3.375 g     3.375 g 100 mL/hr over 30 Minutes Intravenous  Once 09/21/17 1429 09/21/17 1543      Microbiology: Results for orders placed or performed during the hospital encounter of 09/21/17  Urine culture  Status: None   Collection Time: 09/21/17  2:31 PM  Result Value Ref Range Status   Specimen Description   Final    URINE, RANDOM Performed at [facility], [address], [place], [place] [address]    Special Requests   Final    NONE Performed at [facility], [address], [place], [place] [address]    Culture   Final    NO GROWTH Performed at [facility], [place] [address], [place],  [place] [address]    Report Status 09/22/2017 FINAL  Final  Blood culture (routine x 2)     Status: None   Collection Time: 09/21/17  2:50 PM  Result Value Ref Range Status   Specimen Description BLOOD Blood Culture adequate volume  Final   Special Requests   Final    BOTTLES DRAWN AEROBIC AND ANAEROBIC BLOOD RIGHT ARM   Culture   Final    NO GROWTH 5 DAYS Performed at [facility], [address] [place], [place] [address]    Report Status 09/26/2017 FINAL  Final  Blood culture (routine x 2)     Status: None   Collection Time: 09/21/17  2:50 PM  Result Value Ref Range Status   Specimen Description BLOOD BLOOD LEFT HAND  Final   Special Requests BOTTLES DRAWN AEROBIC AND ANAEROBIC  Final   Culture   Final    NO GROWTH 5 DAYS Performed at [facility], [address] [place], [place] [address]    Report Status 09/26/2017 FINAL  Final  MRSA PCR Screening     Status: None   Collection Time: 09/22/17  7:22 AM  Result Value Ref Range Status   MRSA by PCR NEGATIVE NEGATIVE Final    Comment:        The GeneXpert MRSA Assay (FDA approved for NASAL specimens only), is one component of a comprehensive MRSA colonization surveillance program. It is not intended to diagnose MRSA infection nor to guide or monitor treatment for MRSA infections. Performed at [facility], [address], [place], [place] [address]    Studies: Ct Abdomen Pelvis Wo Contrast  Result Date: 09/22/2017 CLINICAL DATA:  Acute renal failure. EXAM: CT ABDOMEN AND PELVIS WITHOUT CONTRAST TECHNIQUE: Multidetector CT imaging of the abdomen and pelvis was performed following the standard protocol without IV contrast. COMPARISON:  06/28/2011 FINDINGS: Lower chest: Small bilateral pleural effusions with associated atelectasis. Calcified plaque over the right coronary artery. Hepatobiliary: Gallbladder is contracted. Liver and biliary tree are normal. Pancreas: Normal. Spleen: Normal.  Adrenals/Urinary Tract: Adrenal glands are normal. Kidneys are normal in size with symmetric bilateral mild hydroureteronephrosis. No renal stones. 1.4 cm cyst over the upper pole right kidney. Foley catheter is present within a decompressed bladder as there is increased density to the fluid within the bladder possibly representing hemorrhagic debris in may be partially responsible for the hydroureteronephrosis. Stomach/Bowel: Stomach and small bowel are within normal. Appendix is not visualized. Evidence of patient's previous partial right colectomy. Vascular/Lymphatic: Minimal calcified plaque over the abdominal aorta. No adenopathy. Reproductive: Within normal. Other: Left femoral venous catheter is present with tip over the left external iliac pain near the junction to the common iliac vein. There is intermediate density material over the left pelvis which crosses midline at and just above the level of the seminal vesicles. This likely represents hemorrhagic debris. This encompasses the distal left ureter and is also adjacent the distal right ureter lesser degree and may be  partially responsible for the bilateral hydroureteronephrosis. Musculoskeletal: Degenerative change of the spine and hips. IMPRESSION: Bilateral symmetric mild hydroureteronephrosis. Foley catheter present within a collapsed bladder as there is increased density material within the bladder possibly hemorrhagic debris. There is also increased density material over the left pelvis with some crossing midline adjacent both distal ureters left worse than right likely hemorrhagic debris. These factors may be responsible for the hydroureteronephrosis. Small bilateral pleural effusions with associated bibasilar atelectasis. 1.4 cm right renal cyst. Previous partial right colectomy. Left femoral venous catheter with tip over the external iliac vein near the junction to common iliac vein. Electronically Signed   By: Marin Olp M.D.   On: 09/22/2017  09:58   Dg Chest 1 View  Result Date: 09/21/2017 CLINICAL DATA:  Altered mental status. EXAM: CHEST  1 VIEW COMPARISON:  03/19/2014 FINDINGS: Lungs are adequately inflated and otherwise clear. Cardiomediastinal silhouette and remainder the exam is unchanged. IMPRESSION: No active disease. Electronically Signed   By: Marin Olp M.D.   On: 09/21/2017 14:54   Ct Head Wo Contrast  Result Date: 09/21/2017 CLINICAL DATA:  Altered mental status.  Previous CVA EXAM: CT HEAD WITHOUT CONTRAST TECHNIQUE: Contiguous axial images were obtained from the base of the skull through the vertex without intravenous contrast. COMPARISON:  Jun 10, 2012 FINDINGS: Brain: There is mild diffuse atrophy. There is a degree of ex vacuo phenomenon involving the anterior horn of the left lateral ventricle. There is no intracranial mass, hemorrhage, extra-axial fluid collection, or midline shift. There is evidence of a prior infarct involving portions of the superior, anterior left temporal lobe as well as much of the anterior left basal ganglia. Elsewhere there is mild small vessel disease in the centra semiovale bilaterally. No acute infarct is demonstrated on this study. Vascular: There is no evident hyperdense vessel. There is no appreciable vascular calcification. Skull: The bony calvarium  appears intact. Sinuses/Orbits: There is mucosal thickening in several ethmoid air cells. Other visualized paranasal sinuses are clear. Orbits appear symmetric bilaterally. Other: Mastoid air cells are clear. IMPRESSION: Mild atrophy. Prior infarct involving much of the left basal ganglia, particularly anteriorly, as well as a portion of the superior left temporal lobe anteriorly. Elsewhere there is mild periventricular small vessel disease. No acute infarct is evident. No mass or hemorrhage. There is mucosal thickening in several ethmoid air cells. Electronically Signed   By: Lowella Grip III M.D.   On: 09/21/2017 14:17   US  Renal  Result Date: 09/21/2017 CLINICAL DATA:  67 y/o M; acute kidney injury. History of colon cancer. EXAM: RENAL / URINARY TRACT ULTRASOUND COMPLETE COMPARISON:  None. FINDINGS: Right Kidney: Length: 12.0. Mildly increased cortical echogenicity. Right kidney upper pole 1.5 cm and mid pole 1.8 cm simple cysts. Moderate hydronephrosis. Left Kidney: Length: 11.9. Mild increased cortical echogenicity. No mass. Moderate hydronephrosis. Bladder: Thickened bladder wall to 1.7 cm. IMPRESSION: 1. Moderate bilateral hydronephrosis. 2. Mild increased echogenicity of the renal cortices compatible with medical renal disease. 3. Bladder wall thickening may reflect cystitis or sequelae of chronic outflow obstruction and underdistention. Electronically Signed   By: Kristine Garbe M.D.   On: 09/21/2017 18:25   Dg Chest Port 1 View  Result Date: 09/26/2017 CLINICAL DATA:  Arrhythmia. EXAM: PORTABLE CHEST 1 VIEW COMPARISON:  Radiographs 09/23/2017, as well as prior exams FINDINGS: Heart is normal in size. Normal mediastinal contours. Continuing improvement in aeration with decreasing patchy and nodular opacities from prior exam. No new airspace disease. No pleural  fluid or pneumothorax. No acute osseous abnormalities. IMPRESSION: Improving aeration over the past 3 days, likely resolving pulmonary edema. No acute findings. Electronically Signed   By: Jeb Levering M.D.   On: 09/26/2017 04:27   Dg Chest Port 1 View  Result Date: 09/23/2017 CLINICAL DATA:  Acute respiratory failure EXAM: PORTABLE CHEST 1 VIEW COMPARISON:  09/22/2017 FINDINGS: Cardiac shadow is stable. The lungs are mildly hyperinflated. Previously seen nodular pattern is again identified but significantly improved when compare with the prior exam. Continued follow-up is recommended. Degenerative changes of the thoracolumbar spine are noted. IMPRESSION: Persistent but improved nodular pattern when compared with the prior exam. Electronically  Signed   By: Inez Catalina M.D.   On: 09/23/2017 08:27   Dg Chest Port 1 View  Result Date: 09/22/2017 CLINICAL DATA:  Dyspnea and tachycardia. EXAM: PORTABLE CHEST 1 VIEW COMPARISON:  09/21/2017 FINDINGS: Heart size and pulmonary vascularity are normal. Since previous study, there is interval development of diffuse nodular perihilar infiltration bilaterally with some peribronchial thickening. This likely represents bronchopneumonia although edema or atypical infectious process could also have this appearance. No blunting of costophrenic angles. No pneumothorax. Mediastinal contours appear intact. IMPRESSION: Interval development of diffuse bilateral nodular perihilar infiltrates with peribronchial thickening suggesting bronchopneumonia or edema. Electronically Signed   By: Lucienne Capers M.D.   On: 09/22/2017 04:29   Ir Nephrostomy Placement Left  Result Date: 09/22/2017 INDICATION: 12 year old with acute kidney injury, sepsis and bilateral hydronephrosis. Patient needs decompression of both kidneys. EXAM: PLACEMENT OF BILATERAL NEPHROSTOMY TUBES WITH ULTRASOUND AND FLUOROSCOPIC GUIDANCE COMPARISON:  None. MEDICATIONS: Ciprofloxacin 400 mg; The antibiotic was administered in an appropriate time frame prior to skin puncture. ANESTHESIA/SEDATION: Fentanyl 50 mcg IV; Versed 2 mg IV Moderate Sedation Time:  40 minutes The patient was continuously monitored during the procedure by the interventional radiology nurse under my direct supervision. CONTRAST:  10 mL-administered into the collecting system(s) FLUOROSCOPY TIME:  Fluoroscopy Time: 3 minutes and 30 seconds, 25 mGy COMPLICATIONS: None immediate. PROCEDURE: Informed written consent was obtained from the patient's sister after a thorough discussion of the procedural risks, benefits and alternatives. All questions were addressed. Maximal Sterile Barrier Technique was utilized including caps, mask, sterile gowns, sterile gloves, sterile drape, hand hygiene  and skin antiseptic. A timeout was performed prior to the initiation of the procedure. Patient was placed prone. Both flanks were prepped and draped in sterile fashion. Procedure was technically difficult due to tachypnea and patient restlessness. Left kidney was identified with ultrasound. The left flank was anesthetized with 1% lidocaine. 21 gauge needle directed into a lower pole calyx with ultrasound guidance. Small amount of contrast was injected to confirm placement in the renal collecting system. 0.018 wire was advanced into the renal collecting system and Accustick dilator set was placed. J wire was advanced down the ureter and the tract was dilated to accommodate a 10 [place] multipurpose drain. 10 French drain was reconstituted in the renal pelvis. Catheter was sutured to skin and attached to gravity bag. Attention was directed to the right kidney. Right kidney is more difficult to evaluate with ultrasound. Right flank was anesthetized with 1% lidocaine. 21 gauge needle was directed into a dilated midpole calyx with ultrasound guidance. Needle position confirmed within the collecting system with ultrasound. A 0.018 wire advanced easily down the ureter. An Accustick dilator set was placed. J wire was advanced down the ureter and the Accustick dilator set was replaced with a 10 [place] dilator. Subsequently, a 10 [place] drain  was advanced over the wire and reconstituted in the right renal pelvis. It is difficult to reconstitute the pigtail drain in the renal pelvis to the small size of the collecting system. Catheter was attached to a gravity bag and sutured to the skin. Fluoroscopic and ultrasound images were taken and saved for documentation. FINDINGS: Mild to moderate bilateral hydronephrosis. Nephrostomy tubes are positioned in the renal pelvis bilaterally. IMPRESSION: Successful placement of bilateral percutaneous nephrostomy tubes with ultrasound and fluoroscopic guidance. Electronically Signed   By:  Markus Daft M.D.   On: 09/22/2017 15:43   Ir Nephrostomy Placement Right  Result Date: 09/22/2017 INDICATION: 48 year old with acute kidney injury, sepsis and bilateral hydronephrosis. Patient needs decompression of both kidneys. EXAM: PLACEMENT OF BILATERAL NEPHROSTOMY TUBES WITH ULTRASOUND AND FLUOROSCOPIC GUIDANCE COMPARISON:  None. MEDICATIONS: Ciprofloxacin 400 mg; The antibiotic was administered in an appropriate time frame prior to skin puncture. ANESTHESIA/SEDATION: Fentanyl 50 mcg IV; Versed 2 mg IV Moderate Sedation Time:  40 minutes The patient was continuously monitored during the procedure by the interventional radiology nurse under my direct supervision. CONTRAST:  10 mL-administered into the collecting system(s) FLUOROSCOPY TIME:  Fluoroscopy Time: 3 minutes and 30 seconds, 25 mGy COMPLICATIONS: None immediate. PROCEDURE: Informed written consent was obtained from the patient's sister after a thorough discussion of the procedural risks, benefits and alternatives. All questions were addressed. Maximal Sterile Barrier Technique was utilized including caps, mask, sterile gowns, sterile gloves, sterile drape, hand hygiene and skin antiseptic. A timeout was performed prior to the initiation of the procedure. Patient was placed prone. Both flanks were prepped and draped in sterile fashion. Procedure was technically difficult due to tachypnea and patient restlessness. Left kidney was identified with ultrasound. The left flank was anesthetized with 1% lidocaine. 21 gauge needle directed into a lower pole calyx with ultrasound guidance. Small amount of contrast was injected to confirm placement in the renal collecting system. 0.018 wire was advanced into the renal collecting system and Accustick dilator set was placed. J wire was advanced down the ureter and the tract was dilated to accommodate a 10 [place] multipurpose drain. 10 French drain was reconstituted in the renal pelvis. Catheter was sutured to  skin and attached to gravity bag. Attention was directed to the right kidney. Right kidney is more difficult to evaluate with ultrasound. Right flank was anesthetized with 1% lidocaine. 21 gauge needle was directed into a dilated midpole calyx with ultrasound guidance. Needle position confirmed within the collecting system with ultrasound. A 0.018 wire advanced easily down the ureter. An Accustick dilator set was placed. J wire was advanced down the ureter and the Accustick dilator set was replaced with a 10 [place] dilator. Subsequently, a 65 French drain was advanced over the wire and reconstituted in the right renal pelvis. It is difficult to reconstitute the pigtail drain in the renal pelvis to the small size of the collecting system. Catheter was attached to a gravity bag and sutured to the skin. Fluoroscopic and ultrasound images were taken and saved for documentation. FINDINGS: Mild to moderate bilateral hydronephrosis. Nephrostomy tubes are positioned in the renal pelvis bilaterally. IMPRESSION: Successful placement of bilateral percutaneous nephrostomy tubes with ultrasound and fluoroscopic guidance. Electronically Signed   By: Markus Daft M.D.   On: 09/22/2017 15:43    Consults: Treatment Team:  Murlean Iba, MD Irine Seal, MD Dionisio David, MD   Subjective:    Overnight Issues: Episode overnight with A-fib RVR, poor rate control despite Cardizem gtt @ 20 mg/hr,  metoprolol IV push, and IV digoxin   Objective:  Vital signs for last 24 hours: Temp:  [98.1 F (36.7 C)] 98.1 F (36.7 C) (08/21 1954) Pulse Rate:  [50-155] 143 (08/22 0600) Resp:  [12-21] 15 (08/22 0600) BP: (88-170)/(49-117) 102/77 (08/22 0600) SpO2:  [95 %-100 %] 96 % (08/22 0600)  Hemodynamic parameters for last 24 hours:    Intake/Output from previous day: 08/21 0701 - 08/22 0700 In: 1726.2 [I.V.:1556.2; IV Piggyback:170] Out: 2275 [Urine:2275]  Intake/Output this shift: No intake/output data  recorded.  Vent settings for last 24 hours:    Physical Exam:  Vital signs: Please see the above listed vital signs General:          Acute on chronically ill appearing, laying in bed, in NAD HEENT: Atraumatic, normocephalic, neck supple, No JVD, Pupils PERRL 3 mm brisk                                        bilaterally Cardiovascular:     Tachycardia, irregularly irregular rhythm, 2+ pulses throughout Pulmonary: Clear bilaterally, no wheezing, even, nonlabored, normal effort Abdominal: Soft, nontender, nondistended, BS+ x4 Extremities: No deformities, no edema NEURO:       Awake and alert, oriented only to person, speech clear, follows commands, no focal                              deficits  Assessment/Plan:   Renal failure secondary to Bilateral Hydronephrosis>>slowly improving, Creatinine down to 2.32 -Nephrology following, appreciate input -S/p HD and percutaneous decompression -Monitor I&O's / urinary output -Follow BMP -Ensure adequate renal perfusion -Avoid nephrotoxic agents as able -Replace electrolytes as indicated  Hypernatremia. -Discussed with Nephrology, Nephrology placing on 0.225% NS  Hypokalemia  -Received 60 mEq PO replacement this am 8/22 -follow up BMP in am 6/23  Hypomagnesemia -Received replacement this am 8/22  Hypophosphatemia -Replacing 8/22  Leukocytosis>>Resolved  -Received course of Rocephin -Monitor fever curve -Trend WBC's  Anemia without evidence of active bleeding>>Hgb dropped to 6.1 -Monitor for s/sx of bleeding -Trend CBC -Will give 1 unit pRBC's 8/22, follow up H&H post infusion  Acute Encephalopathy, ? Secondary to Hypernatremia -Provide supportive care -Lights on during the day -Promote normal wake/sleep cycle -Avoid sedating meds as able  Hyperglycemia. 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 Humphrey Rolls recommends transfer to [facility] and consultation with Dr. 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  Medical treatment has failed given his renal failure and inability to  -Trend Troponin   Disposition: Stepdown Goals of care: Full code VTE prophylaxis: SQ Heparin Updates: No family at bedside for update during NP rounds 09/26/17.  Darel Hong, AGACNP-BC [facility] Pager: [phone]  09/26/2017   Patient ID: Arcola Jansky, male   DOB: [birth year], 67 y.o.   MRN: [id number]

## 2017-09-26 NOTE — Progress Notes (Signed)
[facility]  ROUNDING NOTE   Subjective:  Creatinine now down to 2.32. Serum sodium still a bit high at 152. The patient's blood sugar became quite high with D5W.  Therefore the rate of D5W was decreased.   Objective:  Vital signs in last 24 hours:  Temp:  [98.1 F (36.7 C)] 98.1 F (36.7 C) (08/21 1954) Pulse Rate:  [50-155] 139 (08/22 0843) Resp:  [12-21] 15 (08/22 0600) BP: (88-170)/(49-117) 124/78 (08/22 0843) SpO2:  [95 %-100 %] 96 % (08/22 0600)  Weight change:  Filed Weights   09/23/17 1454 09/24/17 0610 09/25/17 0445  Weight: 79.6 kg 77 kg 74.5 kg    Intake/Output: I/O last 3 completed shifts: In: 3269.4 [I.V.:2707; IV Piggyback:562.4] Out: 5503 [HQION:6295]   Intake/Output this shift:  No intake/output data recorded.  Physical Exam: General: No acute distress, laying in bed  Head: Normocephalic, atraumatic. Moist oral mucosal membranes  Eyes: Anicteric  Neck: Supple, trachea midline  Lungs:  Clear to auscultation, normal effort  Heart: S1S2 no rubs  Abdomen:  Soft, nontender, bowel sounds present  Extremities: No peripheral edema.  Neurologic: Awake, alert  Skin: No lesions       Basic Metabolic Panel: Recent Labs  Lab 09/22/17 0415 09/22/17 2112  09/24/17 0448 09/24/17 2308 09/25/17 0054 09/25/17 1757 09/26/17 0502  NA 136 140   < > 151* 151* 150* 151* 152*  K 4.7 3.4*   < > 3.1* 3.4* 3.5 3.3* 3.4*  CL 107 98   < > 112* 113* 113* 113* 115*  CO2 9* 18*   < > 25 24 26 28 27   GLUCOSE 223* 279*   < > 246* 338* 348* 353* 211*  BUN 122* 94*   < > 47* 37* 36* 30* 25*  CREATININE 13.72* 9.18*   < > 4.18* 3.39* 3.28* 2.64* 2.32*  CALCIUM 8.2* 8.1*   < > 8.3* 9.1 9.0 9.1 8.9  MG 2.5* 2.2  --   --  1.8  --   --  1.9  PHOS 8.7* 6.7*  --   --  2.8  --   --  2.2*   < > = values in this interval not displayed.    Liver Function Tests: Recent Labs  Lab 09/21/17 1342 09/22/17 0415 09/23/17 0425 09/26/17 0502  AST 16 12* 12* 20  ALT  18 15 12 12   ALKPHOS 92 70 80 80  BILITOT 2.3* 1.7* 1.7* 1.0  PROT 7.5 6.1* 6.0* 6.7  ALBUMIN 3.2* 2.6* 2.3* 2.8*   No results for input(s): LIPASE, AMYLASE in the last 168 hours. No results for input(s): AMMONIA in the last 168 hours.  CBC: Recent Labs  Lab 09/21/17 1342 09/22/17 0415 09/23/17 0425 09/24/17 0448 09/26/17 0502  WBC 32.3* 25.4* 13.4* 10.7* 7.0  NEUTROABS 30.4* 23.0*  --   --   --   HGB 10.2* 9.5* 9.3* 9.9* 6.1*  HCT 30.8* 27.8* 27.0* 28.1* 18.2*  MCV 89.8 87.6 85.9 87.6 90.3  PLT 538* 412 363 322 194    Cardiac Enzymes: Recent Labs  Lab 09/21/17 1342 09/22/17 0415 09/22/17 2112 09/26/17 0502  CKTOTAL 129  --   --   --   TROPONINI <0.03 0.06* 0.09* 0.05*    BNP: Invalid input(s): POCBNP  CBG: Recent Labs  Lab 09/26/17 0254 09/26/17 0405 09/26/17 0424 09/26/17 0458 09/26/17 0835  GLUCAP 181* 182* 172* 194* 269*    Microbiology: Results for orders placed or performed during the hospital encounter of  09/21/17  Urine culture     Status: None   Collection Time: 09/21/17  2:31 PM  Result Value Ref Range Status   Specimen Description   Final    URINE, RANDOM Performed at [facility], [address] [place], [place] [address]    Special Requests   Final    NONE Performed at [facility], [address], [place], [place] [address]    Culture   Final    NO GROWTH Performed at [facility], [place] [address] [place], [place] [address]    Report Status 09/22/2017 FINAL  Final  Blood culture (routine x 2)     Status: None   Collection Time: 09/21/17  2:50 PM  Result Value Ref Range Status   Specimen Description BLOOD Blood Culture adequate volume  Final   Special Requests   Final    BOTTLES DRAWN AEROBIC AND ANAEROBIC BLOOD RIGHT ARM   Culture   Final    NO GROWTH 5 DAYS Performed at [facility], [address] [place], [place] [address]    Report Status 09/26/2017 FINAL  Final  Blood culture  (routine x 2)     Status: None   Collection Time: 09/21/17  2:50 PM  Result Value Ref Range Status   Specimen Description BLOOD BLOOD LEFT HAND  Final   Special Requests BOTTLES DRAWN AEROBIC AND ANAEROBIC  Final   Culture   Final    NO GROWTH 5 DAYS Performed at [facility], [address] [place], [place] [address]    Report Status 09/26/2017 FINAL  Final  MRSA PCR Screening     Status: None   Collection Time: 09/22/17  7:22 AM  Result Value Ref Range Status   MRSA by PCR NEGATIVE NEGATIVE Final    Comment:        The GeneXpert MRSA Assay (FDA approved for NASAL specimens only), is one component of a comprehensive MRSA colonization surveillance program. It is not intended to diagnose MRSA infection nor to guide or monitor treatment for MRSA infections. Performed at [facility], [address] [place], [place] [address]     Coagulation Studies: Recent Labs    09/23/17 1226  LABPROT 15.6*  INR 1.25    Urinalysis: No results for input(s): COLORURINE, LABSPEC, PHURINE, GLUCOSEU, HGBUR, BILIRUBINUR, KETONESUR, PROTEINUR, UROBILINOGEN, NITRITE, LEUKOCYTESUR in the last 72 hours.  Invalid input(s): APPERANCEUR    Imaging: Dg Chest Port 1 View  Result Date: 09/26/2017 CLINICAL DATA:  Arrhythmia. EXAM: PORTABLE CHEST 1 VIEW COMPARISON:  Radiographs 09/23/2017, as well as prior exams FINDINGS: Heart is normal in size. Normal mediastinal contours. Continuing improvement in aeration with decreasing patchy and nodular opacities from prior exam. No new airspace disease. No pleural fluid or pneumothorax. No acute osseous abnormalities. IMPRESSION: Improving aeration over the past 3 days, likely resolving pulmonary edema. No acute findings. Electronically Signed   By: Jeb Levering M.D.   On: 09/26/2017 04:27     Medications:   . dextrose 75 mL/hr at 09/26/17 0802  . diltiazem (CARDIZEM) infusion 20 mg/hr (09/26/17 0727)  . sodium phosphate  Dextrose  5% IVPB     . sodium chloride   Intravenous Once  . Chlorhexidine Gluconate Cloth  6 each Topical Q0600  . digoxin  0.0625 mg Oral Daily  . famotidine  20 mg Oral Daily  . folic acid  1 mg Oral Daily  . heparin injection (subcutaneous)  5,000 Units Subcutaneous Q8H  .  insulin aspart  0-15 Units Subcutaneous Q4H  . insulin glargine  10 Units Subcutaneous Daily  . multivitamin with minerals  1 tablet Oral Daily  . propranolol  80 mg Oral TID  . rosuvastatin  5 mg Oral q1800  . tamsulosin  0.4 mg Oral QPC supper   acetaminophen **OR** acetaminophen, haloperidol lactate, metoprolol tartrate  Assessment/ Plan:  67 y.o. 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   Patient appears to have developed acute kidney injury from bilateral hydronephrosis and also underlying possible sepsis and dehydration.  Outpatient medications included benazepril.  1.  Acute renal failure.  Baseline creatinine 1.1 from April 2019 -We will function continues to improve.  Creatinine down to 2.3.  2.  Bilateral hydronephrosis and chronic bladder wall thickening.  Nephrostomies remain functional with good urine output.  Continue to monitor urine output.  3.  Severe acidosis Resolved with relief of obstruction.  4.    Hypokalemia.  Potassium down to 3.4 today.  Calcium repletion per ICU protocol.  5.  Hypernatremia.  Serum sodium has risen to 152.  D5W rate was decreased given hyperglycemia.  We will discontinue D5W in favor of 1/4NS to be given at a rate of 150cc/hr.   Discussed in depth with pharmacy.    LOS: 5 Jordan [name] 8/22/20198:46 AM

## 2017-09-26 NOTE — Consult Note (Signed)
ELECTROPHYSIOLOGY CONSULT NOTE  Patient ID: Jordan [name], MRN: [id number], DOB/AGE: 10-Apr-1950 67 y.o. Admit date: 09/21/2017 Date of Consult: 09/26/2017  Primary Physician: Albina Billet, MD Primary Cardiologist: Mathew Postiglione Jordan [name] is a 67 y.o. male who is being seen today for the evaluation of atrial fibrillation at the request of Dr. Humphrey Rolls   Chief Complaint: Atrial fibrillation with a rapid ventricular response   HPI Jordan [name] is a 67 y.o. male with a history of recurrent atrial fibrillation. 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  Efforts have been made to treated with dofetilide which she failed with torsade de pointes.  Subsequently he ended up back on a class III agent i.e. Sotalol . He has been followed closely by Dr. Ubaldo [name] without symptoms of recurrent atrial fibrillation.  He was admitted to hospital at this time because of acute renal failure associated with bilateral obstructions requiring bilateral nephrostomies.  He has had problems with atrial fibrillation with rapid rates with posttermination pauses and bradycardia.  He has a history of prior strokes.  This is left him somewhat a phasic and dysarthric.  He currently is in bed committed.  His sister came in and helped communicate.  Past Medical History:  Diagnosis Date  . Atrial fibrillation ([place])   . Cancer ([place])    a. 06/2011 b. s/p right hemicolectomy/ colon CA  . Colon polyps   . Dysrhythmia   . Hypertension   . Hyperthyroidism    a. 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 Stroke ([place]) 03/2012   a. residual right sided weakness  . Type 1 diabetes ([place])    a. on insulin b. prior DKA       Surgical History:  Past Surgical History:  Procedure Laterality Date  . CARDIOVERSION N/A 03/25/2014   Procedure: CARDIOVERSION;  Surgeon: Lelon Perla, MD;  Location: [facility] ENDOSCOPY;   Service: Cardiovascular;  Laterality: N/A;  . COLONOSCOPY    . COLONOSCOPY WITH PROPOFOL N/A 07/24/2017   Procedure: COLONOSCOPY WITH PROPOFOL;  Surgeon: Toledo, Benay Pike, MD;  Location: [facility] ENDOSCOPY;  Service: Gastroenterology;  Laterality: N/A;  . HEMICOLECTOMY Right   . IR NEPHROSTOMY PLACEMENT LEFT  09/22/2017  . IR NEPHROSTOMY PLACEMENT RIGHT  09/22/2017     Home Meds: Prior to Admission medications   Medication Sig Start Date End Date Taking? Authorizing Provider  benazepril (LOTENSIN) 20 MG tablet Take 20 mg by mouth daily.   Yes [provider]  folic acid (FOLVITE) 1 MG tablet Take 1 mg by mouth daily.   Yes [provider]  insulin glargine (LANTUS) 100 UNIT/ML injection 12 units in the AM, 10 units at bedtime. Patient taking differently: Inject 28 Units into the skin daily.  03/30/14  Yes Theora Gianotti, NP  insulin lispro (HUMALOG) 100 UNIT/ML injection    Yes [provider]  Multiple Vitamin (MULTIVITAMIN) capsule Take 1 capsule by mouth daily.   Yes [provider]  propranolol (INDERAL) 80 MG tablet Take 1 tablet (80 mg total) by mouth 3 (three) times daily. 03/30/14  Yes Theora Gianotti, NP  rivaroxaban (XARELTO) 20 MG TABS tablet Take 20 mg by mouth daily with supper.   Yes [provider]  rosuvastatin (CRESTOR) 5 MG tablet Take 5 mg by mouth daily.   Yes [provider]  sotalol (  BETAPACE) 80 MG tablet Take 40 mg by mouth 2 (two) times daily.    Yes [provider]    Inpatient Medications:  . amiodarone  400 mg Oral BID  . Chlorhexidine Gluconate Cloth  6 each Topical Q0600  . digoxin  0.0625 mg Oral Daily  . famotidine  20 mg Oral Daily  . folic acid  1 mg Oral Daily  . heparin injection (subcutaneous)  5,000 Units Subcutaneous Q8H  . methimazole  10 mg Oral TID  . multivitamin with minerals  1 tablet Oral Daily  . phosphorus  500 mg Oral BID  . propranolol  80 mg Oral TID  .  rosuvastatin  5 mg Oral q1800  . tamsulosin  0.4 mg Oral QPC supper     Allergies:  Allergies  Allergen Reactions  . Amiodarone     Social History   Socioeconomic History  . Marital status: Single    Spouse name: Not on file  . Number of children: Not on file  . Years of education: Not on file  . Highest education level: Not on file  Occupational History  . Not on file  Social Needs  . Financial resource strain: Not hard at all  . Food insecurity:    Worry: Never true    Inability: Never true  . Transportation needs:    Medical: Yes    Non-medical: Yes  Tobacco Use  . Smoking status: Former Smoker    Last attempt to quit: 09/21/1973    Years since quitting: 44.0  . Smokeless tobacco: Never Used  Substance and Sexual Activity  . Alcohol use: No  . Drug use: No  . Sexual activity: Not Currently  Lifestyle  . Physical activity:    Days per week: Patient refused    Minutes per session: Patient refused  . Stress: Not on file  Relationships  . Social connections:    Talks on phone: Once a week    Gets together: Once a week    Attends religious service: 1 to 4 times per year    Active member of club or organization: No    Attends meetings of clubs or organizations: Never    Relationship status: Never married  . Intimate partner violence:    Fear of current or ex partner: Patient refused    Emotionally abused: Patient refused    Physically abused: Patient refused    Forced sexual activity: Patient refused  Other Topics Concern  . Not on file  Social History Narrative  . Not on file     Family History  Problem Relation Age of Onset  . Heart attack Mother      ROS:  Please see the history of present illness.     All other systems reviewed and negative.    Physical Exam:  Blood pressure (!) 132/50, pulse (!) 52, temperature 97.8 F (36.6 C), temperature source Oral, resp. rate 14, weight 74.5 kg, SpO2 99 %. General: Well developed, well nourished male in no  distress. Head: Normocephalic, atraumatic, sclera non-icteric, no xanthomas, nares are without discharge. EENT: normal Lymph Nodes:  none Back: not examined  Neck: Negative for carotid bruits. JVD not elevated. Lungs: Clear anteriorally and laterally to auscultation without wheezes, rales, or rhonchi. Breathing is unlabored. Heart:rapid and Irregularly irregular rate and thythm with S1 S2.  2/6 systolic murmur , rubs, or gallops appreciated. Abdomen: Soft, non-tender, non-distended with normoactive bowel sounds. No hepatomegaly. No rebound/guarding. No obvious abdominal masses. Msk:  Strength  and tone appear normal for age. Extremities: No clubbing or cyanosis. No*  edema.  Distal pedal pulses are 2+ and equal bilaterally. Skin: Warm and Dry Neuro: Alert and oriented x1 . CN III-XII intact response but not clearly to command grossly normal sensory and motor function . Psych:  Responds to questions poorly.  Not sure if this is confusion or his word finding issues related to his aphasia      Labs: Cardiac Enzymes Recent Labs    09/26/17 0502 09/26/17 0942 09/26/17 1550  TROPONINI 0.05* 0.04* 0.03*   CBC Lab Results  Component Value Date   WBC 7.0 09/26/2017   HGB 6.1 (L) 09/26/2017   HCT 18.2 (L) 09/26/2017   MCV 90.3 09/26/2017   PLT 194 09/26/2017   PROTIME: No results for input(s): LABPROT, INR in the last 72 hours. Chemistry  Recent Labs  Lab 09/26/17 0502  NA 152*  K 3.4*  CL 115*  CO2 27  BUN 25*  CREATININE 2.32*  CALCIUM 8.9  PROT 6.7  BILITOT 1.0  ALKPHOS 80  ALT 12  AST 20  GLUCOSE 211*   Lipids Lab Results  Component Value Date   CHOL 112 03/20/2014   HDL 53 03/20/2014   LDLCALC 50 03/20/2014   TRIG 46 03/20/2014   BNP No results found for: PROBNP Thyroid Function Tests: No results for input(s): TSH, T4TOTAL, T3FREE, THYROIDAB in the last 72 hours.  Invalid input(s): FREET3    Miscellaneous No results found for:  DDIMER  Radiology/Studies:  Ct Abdomen Pelvis Wo Contrast  Result Date: 09/22/2017 CLINICAL DATA:  Acute renal failure. EXAM: CT ABDOMEN AND PELVIS WITHOUT CONTRAST TECHNIQUE: Multidetector CT imaging of the abdomen and pelvis was performed following the standard protocol without IV contrast. COMPARISON:  06/28/2011 FINDINGS: Lower chest: Small bilateral pleural effusions with associated atelectasis. Calcified plaque over the right coronary artery. Hepatobiliary: Gallbladder is contracted. Liver and biliary tree are normal. Pancreas: Normal. Spleen: Normal. Adrenals/Urinary Tract: Adrenal glands are normal. Kidneys are normal in size with symmetric bilateral mild hydroureteronephrosis. No renal stones. 1.4 cm cyst over the upper pole right kidney. Foley catheter is present within a decompressed bladder as there is increased density to the fluid within the bladder possibly representing hemorrhagic debris in may be partially responsible for the hydroureteronephrosis. Stomach/Bowel: Stomach and small bowel are within normal. Appendix is not visualized. Evidence of patient's previous partial right colectomy. Vascular/Lymphatic: Minimal calcified plaque over the abdominal aorta. No adenopathy. Reproductive: Within normal. Other: Left femoral venous catheter is present with tip over the left external iliac pain near the junction to the common iliac vein. There is intermediate density material over the left pelvis which crosses midline at and just above the level of the seminal vesicles. This likely represents hemorrhagic debris. This encompasses the distal left ureter and is also adjacent the distal right ureter lesser degree and may be partially responsible for the bilateral hydroureteronephrosis. Musculoskeletal: Degenerative change of the spine and hips. IMPRESSION: Bilateral symmetric mild hydroureteronephrosis. Foley catheter present within a collapsed bladder as there is increased density material within the  bladder possibly hemorrhagic debris. There is also increased density material over the left pelvis with some crossing midline adjacent both distal ureters left worse than right likely hemorrhagic debris. These factors may be responsible for the hydroureteronephrosis. Small bilateral pleural effusions with associated bibasilar atelectasis. 1.4 cm right renal cyst. Previous partial right colectomy. Left femoral venous catheter with tip over the external iliac vein near the junction to  common iliac vein. Electronically Signed   By: Marin Olp M.D.   On: 09/22/2017 09:58   Dg Chest 1 View  Result Date: 09/21/2017 CLINICAL DATA:  Altered mental status. EXAM: CHEST  1 VIEW COMPARISON:  03/19/2014 FINDINGS: Lungs are adequately inflated and otherwise clear. Cardiomediastinal silhouette and remainder the exam is unchanged. IMPRESSION: No active disease. Electronically Signed   By: Marin Olp M.D.   On: 09/21/2017 14:54   Ct Head Wo Contrast  Result Date: 09/21/2017 CLINICAL DATA:  Altered mental status.  Previous CVA EXAM: CT HEAD WITHOUT CONTRAST TECHNIQUE: Contiguous axial images were obtained from the base of the skull through the vertex without intravenous contrast. COMPARISON:  Jun 10, 2012 FINDINGS: Brain: There is mild diffuse atrophy. There is a degree of ex vacuo phenomenon involving the anterior horn of the left lateral ventricle. There is no intracranial mass, hemorrhage, extra-axial fluid collection, or midline shift. There is evidence of a prior infarct involving portions of the superior, anterior left temporal lobe as well as much of the anterior left basal ganglia. Elsewhere there is mild small vessel disease in the centra semiovale bilaterally. No acute infarct is demonstrated on this study. Vascular: There is no evident hyperdense vessel. There is no appreciable vascular calcification. Skull: The bony calvarium  appears intact. Sinuses/Orbits: There is mucosal thickening in several ethmoid air  cells. Other visualized paranasal sinuses are clear. Orbits appear symmetric bilaterally. Other: Mastoid air cells are clear. IMPRESSION: Mild atrophy. Prior infarct involving much of the left basal ganglia, particularly anteriorly, as well as a portion of the superior left temporal lobe anteriorly. Elsewhere there is mild periventricular small vessel disease. No acute infarct is evident. No mass or hemorrhage. There is mucosal thickening in several ethmoid air cells. Electronically Signed   By: Lowella Grip III M.D.   On: 09/21/2017 14:17   US Renal  Result Date: 09/21/2017 CLINICAL DATA:  67 y/o M; acute kidney injury. History of colon cancer. EXAM: RENAL / URINARY TRACT ULTRASOUND COMPLETE COMPARISON:  None. FINDINGS: Right Kidney: Length: 12.0. Mildly increased cortical echogenicity. Right kidney upper pole 1.5 cm and mid pole 1.8 cm simple cysts. Moderate hydronephrosis. Left Kidney: Length: 11.9. Mild increased cortical echogenicity. No mass. Moderate hydronephrosis. Bladder: Thickened bladder wall to 1.7 cm. IMPRESSION: 1. Moderate bilateral hydronephrosis. 2. Mild increased echogenicity of the renal cortices compatible with medical renal disease. 3. Bladder wall thickening may reflect cystitis or sequelae of chronic outflow obstruction and underdistention. Electronically Signed   By: Kristine Garbe M.D.   On: 09/21/2017 18:25   Dg Chest Port 1 View  Result Date: 09/26/2017 CLINICAL DATA:  Arrhythmia. EXAM: PORTABLE CHEST 1 VIEW COMPARISON:  Radiographs 09/23/2017, as well as prior exams FINDINGS: Heart is normal in size. Normal mediastinal contours. Continuing improvement in aeration with decreasing patchy and nodular opacities from prior exam. No new airspace disease. No pleural fluid or pneumothorax. No acute osseous abnormalities. IMPRESSION: Improving aeration over the past 3 days, likely resolving pulmonary edema. No acute findings. Electronically Signed   By: Jeb Levering  M.D.   On: 09/26/2017 04:27   Dg Chest Port 1 View  Result Date: 09/23/2017 CLINICAL DATA:  Acute respiratory failure EXAM: PORTABLE CHEST 1 VIEW COMPARISON:  09/22/2017 FINDINGS: Cardiac shadow is stable. The lungs are mildly hyperinflated. Previously seen nodular pattern is again identified but significantly improved when compare with the prior exam. Continued follow-up is recommended. Degenerative changes of the thoracolumbar spine are noted. IMPRESSION: Persistent but improved  nodular pattern when compared with the prior exam. Electronically Signed   By: Inez Catalina M.D.   On: 09/23/2017 08:27   Dg Chest Port 1 View  Result Date: 09/22/2017 CLINICAL DATA:  Dyspnea and tachycardia. EXAM: PORTABLE CHEST 1 VIEW COMPARISON:  09/21/2017 FINDINGS: Heart size and pulmonary vascularity are normal. Since previous study, there is interval development of diffuse nodular perihilar infiltration bilaterally with some peribronchial thickening. This likely represents bronchopneumonia although edema or atypical infectious process could also have this appearance. No blunting of costophrenic angles. No pneumothorax. Mediastinal contours appear intact. IMPRESSION: Interval development of diffuse bilateral nodular perihilar infiltrates with peribronchial thickening suggesting bronchopneumonia or edema. Electronically Signed   By: Lucienne Capers M.D.   On: 09/22/2017 04:29   Ir Nephrostomy Placement Left  Result Date: 09/22/2017 INDICATION: 30 year old with acute kidney injury, sepsis and bilateral hydronephrosis. Patient needs decompression of both kidneys. EXAM: PLACEMENT OF BILATERAL NEPHROSTOMY TUBES WITH ULTRASOUND AND FLUOROSCOPIC GUIDANCE COMPARISON:  None. MEDICATIONS: Ciprofloxacin 400 mg; The antibiotic was administered in an appropriate time frame prior to skin puncture. ANESTHESIA/SEDATION: Fentanyl 50 mcg IV; Versed 2 mg IV Moderate Sedation Time:  40 minutes The patient was continuously monitored during  the procedure by the interventional radiology nurse under my direct supervision. CONTRAST:  10 mL-administered into the collecting system(s) FLUOROSCOPY TIME:  Fluoroscopy Time: 3 minutes and 30 seconds, 25 mGy COMPLICATIONS: None immediate. PROCEDURE: Informed written consent was obtained from the patient's sister after a thorough discussion of the procedural risks, benefits and alternatives. All questions were addressed. Maximal Sterile Barrier Technique was utilized including caps, mask, sterile gowns, sterile gloves, sterile drape, hand hygiene and skin antiseptic. A timeout was performed prior to the initiation of the procedure. Patient was placed prone. Both flanks were prepped and draped in sterile fashion. Procedure was technically difficult due to tachypnea and patient restlessness. Left kidney was identified with ultrasound. The left flank was anesthetized with 1% lidocaine. 21 gauge needle directed into a lower pole calyx with ultrasound guidance. Small amount of contrast was injected to confirm placement in the renal collecting system. 0.018 wire was advanced into the renal collecting system and Accustick dilator set was placed. J wire was advanced down the ureter and the tract was dilated to accommodate a 10 [place] multipurpose drain. 10 French drain was reconstituted in the renal pelvis. Catheter was sutured to skin and attached to gravity bag. Attention was directed to the right kidney. Right kidney is more difficult to evaluate with ultrasound. Right flank was anesthetized with 1% lidocaine. 21 gauge needle was directed into a dilated midpole calyx with ultrasound guidance. Needle position confirmed within the collecting system with ultrasound. A 0.018 wire advanced easily down the ureter. An Accustick dilator set was placed. J wire was advanced down the ureter and the Accustick dilator set was replaced with a 10 [place] dilator. Subsequently, a 39 French drain was advanced over the wire and  reconstituted in the right renal pelvis. It is difficult to reconstitute the pigtail drain in the renal pelvis to the small size of the collecting system. Catheter was attached to a gravity bag and sutured to the skin. Fluoroscopic and ultrasound images were taken and saved for documentation. FINDINGS: Mild to moderate bilateral hydronephrosis. Nephrostomy tubes are positioned in the renal pelvis bilaterally. IMPRESSION: Successful placement of bilateral percutaneous nephrostomy tubes with ultrasound and fluoroscopic guidance. Electronically Signed   By: Markus Daft M.D.   On: 09/22/2017 15:43   Ir Nephrostomy Placement Right  Result Date: 09/22/2017 INDICATION: 61 year old with acute kidney injury, sepsis and bilateral hydronephrosis. Patient needs decompression of both kidneys. EXAM: PLACEMENT OF BILATERAL NEPHROSTOMY TUBES WITH ULTRASOUND AND FLUOROSCOPIC GUIDANCE COMPARISON:  None. MEDICATIONS: Ciprofloxacin 400 mg; The antibiotic was administered in an appropriate time frame prior to skin puncture. ANESTHESIA/SEDATION: Fentanyl 50 mcg IV; Versed 2 mg IV Moderate Sedation Time:  40 minutes The patient was continuously monitored during the procedure by the interventional radiology nurse under my direct supervision. CONTRAST:  10 mL-administered into the collecting system(s) FLUOROSCOPY TIME:  Fluoroscopy Time: 3 minutes and 30 seconds, 25 mGy COMPLICATIONS: None immediate. PROCEDURE: Informed written consent was obtained from the patient's sister after a thorough discussion of the procedural risks, benefits and alternatives. All questions were addressed. Maximal Sterile Barrier Technique was utilized including caps, mask, sterile gowns, sterile gloves, sterile drape, hand hygiene and skin antiseptic. A timeout was performed prior to the initiation of the procedure. Patient was placed prone. Both flanks were prepped and draped in sterile fashion. Procedure was technically difficult due to tachypnea and patient  restlessness. Left kidney was identified with ultrasound. The left flank was anesthetized with 1% lidocaine. 21 gauge needle directed into a lower pole calyx with ultrasound guidance. Small amount of contrast was injected to confirm placement in the renal collecting system. 0.018 wire was advanced into the renal collecting system and Accustick dilator set was placed. J wire was advanced down the ureter and the tract was dilated to accommodate a 10 [place] multipurpose drain. 10 French drain was reconstituted in the renal pelvis. Catheter was sutured to skin and attached to gravity bag. Attention was directed to the right kidney. Right kidney is more difficult to evaluate with ultrasound. Right flank was anesthetized with 1% lidocaine. 21 gauge needle was directed into a dilated midpole calyx with ultrasound guidance. Needle position confirmed within the collecting system with ultrasound. A 0.018 wire advanced easily down the ureter. An Accustick dilator set was placed. J wire was advanced down the ureter and the Accustick dilator set was replaced with a 10 [place] dilator. Subsequently, a 51 French drain was advanced over the wire and reconstituted in the right renal pelvis. It is difficult to reconstitute the pigtail drain in the renal pelvis to the small size of the collecting system. Catheter was attached to a gravity bag and sutured to the skin. Fluoroscopic and ultrasound images were taken and saved for documentation. FINDINGS: Mild to moderate bilateral hydronephrosis. Nephrostomy tubes are positioned in the renal pelvis bilaterally. IMPRESSION: Successful placement of bilateral percutaneous nephrostomy tubes with ultrasound and fluoroscopic guidance. Electronically Signed   By: Markus Daft M.D.   On: 09/22/2017 15:43    EKG: Atrial fibrillation with a rapid rate  Telemetry was personally reviewed demonstrating recurrent episodes of atrial fibrillation with a rapid rate   Assessment and Plan:  Atrial  fibrillation-paroxysmal  History of  Thyrotoxicosis with thyrotropin receptor antibodies  Prior CVA  Euthyroid  History of stroke  Posttermination pauses and sinus bradycardia    The patient has a history of thyrotoxicosis associated with thyrotropin receptor antibodies which probably makes this not amiodarone toxicity.  Furthermore, when he was thyrotoxic his amiodarone levels were undetectable supporting the idea it was not amiodarone toxicity.  It may be worth getting Dr. Joycie Peek input  I think for now, it is not appropriate to proceed with invasive procedures especially given the long-standing nature of this issue, the current comorbidities including renal issues and confusion and would favor the use  of amiodarone again.  We will hold off on any thyroid treatments until we can get clarity as to the mechanism of the thyrotoxicosis based on the aforementioned information.  As the amiodarone accumulates, other rate controlling medications will hopefully be able to be discontinued  I will be available to see him again on Tuesday.  Will be available by phone tomorrow and [phone]    Virl Axe

## 2017-09-26 NOTE — Progress Notes (Signed)
Inpatient Diabetes Program Recommendations  AACE/ADA: New Consensus Statement on Inpatient Glycemic Control (2015)  Target Ranges:  Prepandial:   less than 140 mg/dL      Peak postprandial:   less than 180 mg/dL (1-2 hours)      Critically ill patients:  140 - 180 mg/dL   Results for Nealy, Mehran W (MRN [id number]) as of 09/26/2017 08:03  Ref. Range 09/25/2017 00:38 09/25/2017 05:13 09/25/2017 07:40 09/25/2017 12:26 09/25/2017 13:21 09/25/2017 13:24 09/25/2017 15:01 09/25/2017 16:02 09/25/2017 17:05 09/25/2017 18:02 09/25/2017 19:00 09/25/2017 19:57 09/25/2017 20:57 09/25/2017 22:01 09/25/2017 22:58  Glucose-Capillary Latest Ref Range: 70 - 99 mg/dL 297 (H)  8 units NOVOLOG   329 (H)  11 units NOVOLOG  262 (H)    7 units LANTUS 414 (H)  20 units NOVOLOG  501 (HH) 486 (H) 396 (H)  IV Insulin Drip Started 398 (H)     375 (H) 316 (H) 259 (H) 197 (H) 168 (H) 133 (H) 117 (H)    Results for Bayly, Clement W (MRN [id number]) as of 09/26/2017 08:03  Ref. Range 09/25/2017 23:54 09/26/2017 01:05 09/26/2017 02:02 09/26/2017 02:54 09/26/2017 04:05 09/26/2017 04:24 09/26/2017 04:58  Glucose-Capillary Latest Ref Range: 70 - 99 mg/dL 115 (H) 122 (H) 137 (H) 181 (H)  10 units LANTUS 182 (H) 172 (H) 194 (H)  IV Insulin Drip OFF    Home DM Meds: Lantus 28 units daily       Humalog 10 units Breakfast/ 8 units Lunch/ 12 units Dinner       Humalog 4 units with Snacks       Humalog 1-6 units per SSI  Current Orders: Lantus 10 units daily      Novolog Moderate Correction Scale/ SSI (0-15 units) Q4 hours        Note patient started on the IV Insulin drip yesterday for extremely elevated CBGs.  Drip started around 3pm.  Lantus 10 units given this AM for transition off the IV Insulin drip.  Note that patient saw his Endocrinologist Dr. Gabriel Carina with [facility] on 06/05/2017--At that visit pt was instructed to take the following: Lantus 28 units daily Humalog 10 units Breakfast/ 8 units Lunch/ 12 units  Dinner Humalog 4 units with Snacks Humalog 1-6 units per SSI     MD- Patient with Type 1 DM per Dr. Joycie Peek records.  Needs more basal insulin to prevent ketoacidosis.  Please consider the following in-hospital insulin adjustments:  1. Increase Lantus to 28 units daily (start tomorrow AM--08/23)  2. Give extra Lantus 18 units X 1 dose this AM to equal 28 units total Lantus (since pt received 10 units Lantus at 3am today for transition off the IV Insulin drip)  3. Change Novolog SSI to Novolog Sensitive Correction Scale/ SSI (0-9 units) TID AC + HS  4. Start Novolog Meal Coverage: Novolog 4 units TID with meals   (Please add the following Hold Parameters: Hold if pt eats <50% of meal, Hold if pt NPO)     --Will follow patient during hospitalization--  Wyn Quaker RN, MSN, CDE Diabetes Coordinator Inpatient Glycemic Control Team Team Pager: [phone] (8a-5p)

## 2017-09-26 NOTE — Progress Notes (Signed)
Dr. Caryl Comes saw the patient and feels like the patient is not a candidate for ablation of the AV node and permanent pacemaker. Thus overnight patient should not be aggressively treated for tachycardia because during daytime he becomes severely bradycardic as low as 40 and 50. Advise continuation of current medications overnight and hold beta blockers and digitoxin if heart rate remains below 60. No need to be treated aggressively. Advise getting palliative care involved and make the patient DO NOT RESUSCITATE.

## 2017-09-26 NOTE — Progress Notes (Signed)
[facility] NAME: Jordan [name]    MR#:  [id number]  DATE OF BIRTH:  1950-09-03  SUBJECTIVE:   Per RN remains tachycardia. Now on IV ditiazem gtt. Received oral amiodarone. HR now in the 50's  Sugars up. Good UOP REVIEW OF SYSTEMS:   Review of Systems  Constitutional: Negative for chills, fever and weight loss.  HENT: Negative for ear discharge, ear pain and nosebleeds.   Eyes: Negative for blurred vision, pain and discharge.  Respiratory: Negative for sputum production, shortness of breath, wheezing and stridor.   Cardiovascular: Negative for chest pain, palpitations, orthopnea and PND.  Gastrointestinal: Negative for abdominal pain, diarrhea, nausea and vomiting.  Genitourinary: Negative for frequency and urgency.  Musculoskeletal: Negative for back pain and joint pain.  Neurological: Negative for sensory change, speech change, focal weakness and weakness.  Psychiatric/Behavioral: Negative for depression and hallucinations. The patient is not nervous/anxious.    DRUG ALLERGIES:   Allergies  Allergen Reactions  . Amiodarone     VITALS:  Blood pressure (!) 131/52, pulse (!) 51, temperature 97.8 F (36.6 C), temperature source Oral, resp. rate (!) 26, weight 74.5 kg, SpO2 100 %.  PHYSICAL EXAMINATION:   Physical Exam  GENERAL:  67 y.o.-year-old patient lying in the bed with no acute distress. Critically ill EYES: Pupils equal, round, reactive to light and accommodation. No scleral icterus. Extraocular muscles intact.  HEENT: Head atraumatic, normocephalic. Oropharynx and nasopharynx clear.  NECK:  Supple, no jugular venous distention. No thyroid enlargement, no tenderness.  LUNGS: Normal breath sounds bilaterally, no wheezing, rales, rhonchi. No use of accessory muscles of respiration.  CARDIOVASCULAR: S1, S2 normal. No murmurs, rubs, or gallops.  ABDOMEN: Soft, nontender, nondistended. Bowel sounds present. No  organomegaly or mass. Foley+ EXTREMITIES: No cyanosis, clubbing or edema b/l.   Right groin HD access NEUROLOGIC: unable to assess due to mentation. Moves all extremities well   PSYCHIATRIC:  patient is lethargic and very sleepy SKIN: No obvious rash, lesion, or ulcer.   LABORATORY PANEL:  CBC Recent Labs  Lab 09/26/17 0502 09/26/17 1915  WBC 7.0  --   HGB 6.1* 11.7*  HCT 18.2* 34.5*  PLT 194  --     Chemistries  Recent Labs  Lab 09/26/17 0502  NA 152*  K 3.4*  CL 115*  CO2 27  GLUCOSE 211*  BUN 25*  CREATININE 2.32*  CALCIUM 8.9  MG 1.9  AST 20  ALT 12  ALKPHOS 80  BILITOT 1.0   Cardiac Enzymes Recent Labs  Lab 09/26/17 1550  TROPONINI 0.03*   RADIOLOGY:  Dg Chest Port 1 View  Result Date: 09/26/2017 CLINICAL DATA:  Arrhythmia. EXAM: PORTABLE CHEST 1 VIEW COMPARISON:  Radiographs 09/23/2017, as well as prior exams FINDINGS: Heart is normal in size. Normal mediastinal contours. Continuing improvement in aeration with decreasing patchy and nodular opacities from prior exam. No new airspace disease. No pleural fluid or pneumothorax. No acute osseous abnormalities. IMPRESSION: Improving aeration over the past 3 days, likely resolving pulmonary edema. No acute findings. Electronically Signed   By: Jeb Levering M.D.   On: 09/26/2017 04:27   ASSESSMENT AND PLAN:  Jordan [name]  is a 67 y.o. male brought in by family secondary to altered mental status.  The patient has a history of stroke and has trouble speaking his words and has baseline right-sided weakness.  He states his appetite has been poor for the past week.  In the ER,  his creatinine was found to be 14.94 with a potassium of 6.1.   1.  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  bladder scan showing only 80 cc in the bladder.  IV fluid hydration   Likely acute kidney injury from poor appetite and obstructive uropathy.  Hold nephrotoxic medications. -Patient irecieved  urgent hemodialysis through right femoral HD Access--Now on hold -Seen by urology. Patient is s/p  bilateral nephrectomy tube secondary to hydronephrosis with possible obstructive mass in the bladder as opposed to hemorrhagic debris--will need cystoscopy with bladder mass biopsy per Dr Erlene Quan -creat 14.0--9.18--7.89--4.14--3.28--2.1 -UC no growth  2.  Hyperkalemia.  Likely from acute kidney injury. -  IV fluid hydration.  Monitor on telemetry.  pt got a dose of lokelma and IV calcium. -improved  3.  Type 1 diabetes mellitus.   -sugars going up. Insulin gtt    4. Acute on chronic atrial fibrillation.  With his creatinine clearance being where it is the sotalol is contraindicated.    -continue his propranolol, cardizem and digoxin -currently in SR -now added amiodarone by Dr Vernard Gambles long term anticoagulation according to Dr khan--d/ced heparin gtt and monitor  5.  History of stroke with right-sided weakness.  Physical therapy consultation.  6.  Essential hypertension on propranolol.  Hold benazepril.  7.  Hyperlipidemia unspecified.  On low-dose Crestor.  CPK normal range  8.  Falls.  Physical therapy evaluation    Overall pt improving slowly  Case discussed with Care Management/Social Worker. Management plans discussed with the patient, family and they are in agreement.  CODE STATUS: full  DVT Prophylaxis: heparin  TOTAL TIME TAKING CARE OF THIS PATIENT: 30* minutes.  >50% time spent on counselling and coordination of care  POSSIBLE D/C IN *few* DAYS, DEPENDING ON CLINICAL CONDITION.  Note: This dictation was prepared with Dragon dictation along with smaller phrase technology. Any transcriptional errors that result from this process are unintentional.  Fritzi Mandes M.D on 09/26/2017 at 8:06 PM  Between 7am to 6pm - Pager - [phone]  After 6pm go to [email or web address] - password [email or web address] [facility]  Office  [phone]  CC: Primary care  physician; Albina Billet, MDPatient ID: Jordan [name], male   DOB: [birth year], 67 y.o.   MRN: [id number]

## 2017-09-26 NOTE — Progress Notes (Signed)
SUBJECTIVE: Patient  Heart rate as high as 190   Vitals:   09/26/17 0400 09/26/17 0500 09/26/17 0520 09/26/17 0600  BP: (!) 142/73 (!) 88/49 (!) 101/55 102/77  Pulse:  (!) 50 (!) 155 (!) 143  Resp: 14 19 20 15   Temp:      TempSrc:      SpO2:  99% 98% 96%  Weight:        Intake/Output Summary (Last 24 hours) at 09/26/2017 0736 Last data filed at 09/26/2017 7209 Gross per 24 hour  Intake 1726.23 ml  Output 2275 ml  Net -548.77 ml    LABS: Basic Metabolic Panel: Recent Labs    09/24/17 2308  09/25/17 1757 09/26/17 0502  NA 151*   < > 151* 152*  K 3.4*   < > 3.3* 3.4*  CL 113*   < > 113* 115*  CO2 24   < > 28 27  GLUCOSE 338*   < > 353* 211*  BUN 37*   < > 30* 25*  CREATININE 3.39*   < > 2.64* 2.32*  CALCIUM 9.1   < > 9.1 8.9  MG 1.8  --   --  1.9  PHOS 2.8  --   --  2.2*   < > = values in this interval not displayed.   Liver Function Tests: Recent Labs    09/26/17 0502  AST 20  ALT 12  ALKPHOS 80  BILITOT 1.0  PROT 6.7  ALBUMIN 2.8*   No results for input(s): LIPASE, AMYLASE in the last 72 hours. CBC: Recent Labs    09/24/17 0448 09/26/17 0502  WBC 10.7* 7.0  HGB 9.9* 6.1*  HCT 28.1* 18.2*  MCV 87.6 90.3  PLT 322 194   Cardiac Enzymes: Recent Labs    09/26/17 0502  TROPONINI 0.05*   BNP: Invalid input(s): POCBNP D-Dimer: No results for input(s): DDIMER in the last 72 hours. Hemoglobin A1C: No results for input(s): HGBA1C in the last 72 hours. Fasting Lipid Panel: No results for input(s): CHOL, HDL, LDLCALC, TRIG, CHOLHDL, LDLDIRECT in the last 72 hours. Thyroid Function Tests: No results for input(s): TSH, T4TOTAL, T3FREE, THYROIDAB in the last 72 hours.  Invalid input(s): FREET3 Anemia Panel: No results for input(s): VITAMINB12, FOLATE, FERRITIN, TIBC, IRON, RETICCTPCT in the last 72 hours.   PHYSICAL EXAM General: Well developed, well nourished, in no acute distress HEENT:  Normocephalic and atramatic Neck:  No JVD.  Lungs: Clear  bilaterally to auscultation and percussion. Heart: HRRR . Normal S1 and S2 without gallops or murmurs.  Abdomen: Bowel sounds are positive, abdomen soft and non-tender  Msk:  Back normal, normal gait. Normal strength and tone for age. Extremities: No clubbing, cyanosis or edema.   Neuro: Alert and oriented X 3. 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 Medical treatment failed as has renal failure and some drugs such as sotolol and Tykosin can not be given and allergic to amiodrone. Advise transfer or Make DNR. Active Problems:   Acute kidney injury ([place])    Jordan David, MD, [facility] 09/26/2017 7:36 AM

## 2017-09-26 NOTE — Progress Notes (Signed)
Spoke to Dr. Humphrey Rolls. Dr. Humphrey Rolls states he will continue to follow as primary cardiologist with input and assistance from Dr. Caryl Comes as needed.  Per Dr. Humphrey Rolls, keep patient on cardizem drip at 5mg /hr for now.  Hold cardizem drip if HR sustains less than 50bpm. Discussed patient's scheduled doses of propranolol and digoxin with Dr. Humphrey Rolls. Per Dr. Humphrey Rolls, continue meds as ordered; however, hold digoxin and propranolol for HR less than 70bpm.    See note from Dr. Humphrey Rolls for further details.

## 2017-09-26 NOTE — Progress Notes (Signed)
eLink Physician-Brief Progress Note Patient Name: Jordan [name] DOB: 09/28/1950 MRN: [id number]   Date of Service  09/26/2017  HPI/Events of Note  Hypomagnesemia / Hypophosphatemia - Mg++ = 1.9, PO4--- = 2.2 and Creatinine = 2.32.   eICU Interventions  Will replace Mg and PO4---.     Intervention Category Major Interventions: Electrolyte abnormality - evaluation and management  Sommer,Steven Eugene 09/26/2017, 5:58 AM

## 2017-09-26 NOTE — Progress Notes (Signed)
OT Cancellation Note  Patient Details Name: Jordan [name] MRN: [id number] DOB: [birth year]   Cancelled Treatment:    Reason Eval/Treat Not Completed: Medical issues which prohibited therapy.  Pt had an episode overnight with uncontrolled heart rate and is to be seen by Dr.Ken Fath for cardiac care plan.  He has a creatinine level of 2.32 and glucose has decreased but still 211.  Will hold OT evaluation until he receives consult for cardiac issues.    Chrys Racer, OTR/L ascom [phone] 09/26/17, 10:41 AM

## 2017-09-26 NOTE — Progress Notes (Signed)
Dr. 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 He also says Dr.Ken Ubaldo Glassing, has neen following him recently and wants him to see patient. I saw him 3 years ago. Will transfer to [facility] cardiology and sign off.

## 2017-09-26 NOTE — Evaluation (Signed)
Physical Therapy Evaluation Patient Details Name: Jordan [name] MRN: [id number] DOB: [birth year] Today's Date: 09/26/2017   History of Present Illness  Jordan [name] is an 67 y.o. male. with a medical history of type 1 diabetes, previous CVA, atrial fibrillation, colon cancer status post resection, and hyperthyroidism who was admitted with acute renal failure, acute encephalopathy, and hyperkalemia.    Clinical Impression  Pt chart was reviewed, MD was called to ascertain appropriateness of PT for pt given A-fib RVR, as well as low HCT, HgB, and RBC. MD gave go ahead to perform bed mobility and EOB exercises. On PT arrival pt was awake and alert, A+O x 3. Pt HR was decreased from earlier documented values, now in the 50's and BP was also more normal-122/59. RN reported his heart rate has been alternating between bradycardia and tachycardia.  Pt was able to carry on conversation with PT,although frequently lost train of thought or struggled to find words. Pt was able to perform bed activitites in supine (details below) without any undue fatigue or abnormal increase in vitals. Pt moved supine to sit mod assist. Pt vitals remained near baseline with all activities. Pt did need help finding balance once in sitting, but was able to maintain midline with VC's and physical cues. Pt demonstrated R sided weakness( per chart review from previous CVA). Pt coordination and sensation on R side also presented impaired, but difficult to assess fully due to AMS. Pt was able to perform sitting balance exercises (details below). Pt showed ability to accept min perturbations in sitting and was able to actively resit trunk flex, ext, and rotation. Pt struggled with reaching activities in sitting, in particular on R side due to RUE weakness. Pt was poor historian of home environment and prior lvl of function but some details were gathered, and others were pulled in from previous hospital stay documentation. Would benefit from  skilled PT to address above deficits and promote optimal return to PLOF. Pending further medical improvements and mobility assessment, recommend transition to STR upon discharge from acute hospitalization.    Follow Up Recommendations SNF    Equipment Recommendations       Recommendations for Other Services       Precautions / Restrictions Precautions Precautions: Fall Restrictions Weight Bearing Restrictions: No Other Position/Activity Restrictions: Nephrostomy bags bilat. R sided weakness      Mobility  Bed Mobility Overal bed mobility: Needs Assistance Bed Mobility: Supine to Sit     Supine to sit: Mod assist     General bed mobility comments: Pt requires help moving RLE to EOB, and needs mod assist at trunk to bring pt into upright position.   Transfers                 General transfer comment: Defferred today due to pt medical status and AMS.  Ambulation/Gait             General Gait Details: Defferred today due to pt medical status and AMS.  Stairs            Wheelchair Mobility    Modified Rankin (Stroke Patients Only)       Balance Overall balance assessment: Needs assistance Sitting-balance support: Feet supported;No upper extremity supported Sitting balance-Leahy Scale: Fair Sitting balance - Comments: Pt leans to R, using RUE for support if not cued to shift to L side. Pt is able to maintain sitting without BUE support but is unstable and displays sway side to side.  Postural control:  Right lateral lean     Standing balance comment: Defferred today due to pt medical status and AMS.                             Pertinent Vitals/Pain Pain Assessment: No/denies pain    Home Living Family/patient expects to be discharged to:: Private residence Living Arrangements: Alone Available Help at Discharge: Family Type of Home: Mobile home Home Access: Stairs to enter Entrance Stairs-Rails: Right;Left;Can reach  both Entrance Stairs-Number of Steps: 5 Home Layout: One level Home Equipment: Walker - 2 wheels;Cane - single point Additional Comments: per chart review from admission 1 year ago. Pt was unreliable historian 2/2 AMS, pt provided some details the same and some different from chart review.     Prior Function           Comments: Pt unable to provide details on prior functioning     Hand Dominance        Extremity/Trunk Assessment   Upper Extremity Assessment Upper Extremity Assessment: RUE deficits/detail RUE Deficits / Details: RUE weakness 2/2 past CVA. Pt struggles with finger to nose coordination test with RUE. Difficult to differentiate between pt not understanding task cognitively vs decreased coordination.  RUE Sensation: decreased proprioception RUE Coordination: decreased fine motor    Lower Extremity Assessment Lower Extremity Assessment: RLE deficits/detail RLE Deficits / Details: RLE weakness, difficult to fully assess due to pt AMS       Communication   Communication: Expressive difficulties(Pt sruggles to find words occassionally. )  Cognition Arousal/Alertness: Awake/alert Behavior During Therapy: Flat affect Overall Cognitive Status: Impaired/Different from baseline Area of Impairment: Orientation;Attention;Following commands;Problem solving                 Orientation Level: Place;Person;Situation Current Attention Level: Sustained   Following Commands: Follows one step commands inconsistently;Follows multi-step commands inconsistently;Follows one step commands with increased time;Follows multi-step commands with increased time     Problem Solving: Slow processing;Decreased initiation;Difficulty sequencing;Requires tactile cues;Requires verbal cues        General Comments      Exercises General Exercises - Upper Extremity Shoulder Flexion: AROM;Both;10 reps Elbow Flexion: AROM;Both;10 reps Elbow Extension: AROM;Both;10 reps General  Exercises - Lower Extremity Ankle Circles/Pumps: AROM;Both;20 reps Heel Slides: AROM;Both;20 reps Straight Leg Raises: AROM;Both;10 reps Other Exercises Other Exercises: Bed mobility: Supine to sit mod assist; Sitting balance activities: perturbations, anti rotational exercise, and UE reaching   Assessment/Plan    PT Assessment Patient needs continued PT services  PT Problem List Decreased strength;Decreased mobility;Decreased safety awareness;Decreased coordination;Decreased range of motion;Decreased activity tolerance;Decreased cognition;Cardiopulmonary status limiting activity;Decreased balance;Decreased knowledge of use of DME;Impaired sensation       PT Treatment Interventions DME instruction;Stair training;Functional mobility training;Gait training;Therapeutic exercise;Therapeutic activities;Patient/family education;Balance training;Neuromuscular re-education    PT Goals (Current goals can be found in the Care Plan section)  Acute Rehab PT Goals PT Goal Formulation: Patient unable to participate in goal setting    Frequency Min 2X/week   Barriers to discharge        Co-evaluation               AM-PAC PT "6 Clicks" Daily Activity  Outcome Measure Difficulty turning over in bed (including adjusting bedclothes, sheets and blankets)?: Unable Difficulty moving from lying on back to sitting on the side of the bed? : Unable Difficulty sitting down on and standing up from a chair with arms (e.g., wheelchair, bedside commode, etc,.)?: Unable  Help needed moving to and from a bed to chair (including a wheelchair)?: A Lot Help needed walking in hospital room?: A Lot Help needed climbing 3-5 steps with a railing? 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

## 2017-09-27 LAB — GLUCOSE, CAPILLARY
GLUCOSE-CAPILLARY: 214 mg/dL — AB (ref 70–99)
GLUCOSE-CAPILLARY: 178 mg/dL — AB (ref 70–99)
GLUCOSE-CAPILLARY: 140 mg/dL — AB (ref 70–99)
GLUCOSE-CAPILLARY: 149 mg/dL — AB (ref 70–99)
GLUCOSE-CAPILLARY: 160 mg/dL — AB (ref 70–99)
GLUCOSE-CAPILLARY: 168 mg/dL — AB (ref 70–99)
Glucose-Capillary: 118 mg/dL — ABNORMAL HIGH (ref 70–99)
Glucose-Capillary: 141 mg/dL — ABNORMAL HIGH (ref 70–99)
Glucose-Capillary: 145 mg/dL — ABNORMAL HIGH (ref 70–99)
Glucose-Capillary: 154 mg/dL — ABNORMAL HIGH (ref 70–99)
Glucose-Capillary: 165 mg/dL — ABNORMAL HIGH (ref 70–99)
Glucose-Capillary: 200 mg/dL — ABNORMAL HIGH (ref 70–99)
Glucose-Capillary: 237 mg/dL — ABNORMAL HIGH (ref 70–99)
Glucose-Capillary: 249 mg/dL — ABNORMAL HIGH (ref 70–99)
Glucose-Capillary: 87 mg/dL (ref 70–99)

## 2017-09-27 LAB — BASIC METABOLIC PANEL
Anion gap: 7 (ref 5–15)
BUN: 25 mg/dL — AB (ref 8–23)
CALCIUM: 8.2 mg/dL — AB (ref 8.9–10.3)
CO2: 32 mmol/L (ref 22–32)
CREATININE: 2.25 mg/dL — AB (ref 0.61–1.24)
Chloride: 108 mmol/L (ref 98–111)
GFR calc Af Amer: 33 mL/min — ABNORMAL LOW (ref >60)
GFR, EST NON AFRICAN AMERICAN: 29 mL/min — AB (ref >60)
GLUCOSE: 178 mg/dL — AB (ref 70–99)
Potassium: 3.3 mmol/L — ABNORMAL LOW (ref 3.5–5.1)
SODIUM: 147 mmol/L — AB (ref 135–145)

## 2017-09-27 LAB — CBC
HCT: 31.4 % — ABNORMAL LOW (ref 40.0–52.0)
Hemoglobin: 10.9 g/dL — ABNORMAL LOW (ref 13.0–18.0)
MCH: 30.5 pg (ref 26.0–34.0)
MCHC: 34.6 g/dL (ref 32.0–36.0)
MCV: 88 fL (ref 80.0–100.0)
PLATELETS: 269 10*3/uL (ref 150–440)
RBC: 3.57 MIL/uL — ABNORMAL LOW (ref 4.40–5.90)
RDW: 13.5 % (ref 11.5–14.5)
WBC: 11.6 10*3/uL — AB (ref 3.8–10.6)

## 2017-09-27 LAB — TYPE AND SCREEN
ABO/RH(D): A NEG
Antibody Screen: NEGATIVE
UNIT DIVISION: 0

## 2017-09-27 LAB — BPAM RBC
BLOOD PRODUCT EXPIRATION DATE: 201909022359
ISSUE DATE / TIME: 201908221024
Unit Type and Rh: 600

## 2017-09-27 LAB — MAGNESIUM: MAGNESIUM: 1.8 mg/dL (ref 1.7–2.4)

## 2017-09-27 LAB — PHOSPHORUS: PHOSPHORUS: 2.5 mg/dL (ref 2.5–4.6)

## 2017-09-27 LAB — TROPONIN I
Troponin I: 0.03 ng/mL (ref <0.03)
Troponin I: <0.03 ng/mL (ref <0.03)
Troponin I: <0.03 ng/mL (ref <0.03)

## 2017-09-27 MED ORDER — INSULIN ASPART 100 UNIT/ML ~~LOC~~ SOLN
0.0000 [IU] | Freq: Every day | SUBCUTANEOUS | Status: DC
  Administered 2017-09-27: 2 [IU] via SUBCUTANEOUS
  Filled 2017-09-27: qty 1

## 2017-09-27 MED ORDER — INSULIN GLARGINE 100 UNIT/ML ~~LOC~~ SOLN
28.0000 [IU] | Freq: Every day | SUBCUTANEOUS | Status: DC
  Administered 2017-09-27 – 2017-09-30 (×3): 28 [IU] via SUBCUTANEOUS
  Filled 2017-09-27 (×4): qty 0.28

## 2017-09-27 MED ORDER — POTASSIUM CHLORIDE 20 MEQ/15ML (10%) PO SOLN
40.0000 meq | ORAL | Status: AC
  Administered 2017-09-27 (×2): 40 meq via ORAL
  Filled 2017-09-27 (×2): qty 30

## 2017-09-27 MED ORDER — POTASSIUM CHLORIDE 10 MEQ/100ML IV SOLN
10.0000 meq | INTRAVENOUS | Status: AC
  Administered 2017-09-27 (×2): 10 meq via INTRAVENOUS
  Filled 2017-09-27 (×2): qty 100

## 2017-09-27 MED ORDER — INSULIN ASPART 100 UNIT/ML ~~LOC~~ SOLN
0.0000 [IU] | Freq: Three times a day (TID) | SUBCUTANEOUS | Status: DC
  Administered 2017-09-27 (×2): 2 [IU] via SUBCUTANEOUS
  Administered 2017-09-28: 3 [IU] via SUBCUTANEOUS
  Filled 2017-09-27 (×4): qty 1

## 2017-09-27 MED ORDER — POTASSIUM CHLORIDE CRYS ER 20 MEQ PO TBCR: 40.0000 meq | EXTENDED_RELEASE_TABLET | ORAL | Status: DC

## 2017-09-27 MED ORDER — INSULIN ASPART 100 UNIT/ML ~~LOC~~ SOLN
4.0000 [IU] | Freq: Three times a day (TID) | SUBCUTANEOUS | Status: DC
  Administered 2017-09-27 – 2017-09-28 (×3): 4 [IU] via SUBCUTANEOUS
  Filled 2017-09-27 (×4): qty 1

## 2017-09-27 NOTE — Progress Notes (Signed)
Follow up - Critical Care Medicine Note    Patient Details:    Jordan [name] is an 67 y.o. male. with a medical history of type 1 diabetes, previous CVA, atrial fibrillation, colon cancer status post resection, and hyperthyroidism who was admitted with acute renal failure, acute encephalopathy, and hyperkalemia.   Lines, Airways, Drains: Nephrostomy Left 10.2 Fr. (Active)  Dressing Status Clean;Dry;Intact 09/23/2017  7:35 AM  Dressing Type Tegaderm 09/23/2017  7:35 AM  Tube Status To gravity 09/23/2017  7:35 AM  Collection Container Leg bag 09/23/2017  7:35 AM  Input (mL) 150 mL 09/22/2017 10:57 PM  Output (mL) 200 mL 09/23/2017  8:22 AM     Nephrostomy Right 10.2 Fr. (Active)  Dressing Status Clean;Dry;Intact 09/23/2017  7:35 AM  Dressing Type Tegaderm 09/23/2017  7:35 AM  Tube Status To gravity 09/23/2017  7:35 AM  Collection Container Leg bag 09/23/2017  7:35 AM  Input (mL) 175 mL 09/22/2017 10:57 PM  Output (mL) 200 mL 09/23/2017  8:22 AM     Urethral Catheter  (Active)  Indication for Insertion or Continuance of Catheter Acute urinary retention;Unstable critical patients (first 24-48 hours) 09/23/2017  7:35 AM  Site Assessment Clean;Intact;Dry 09/23/2017  7:35 AM  Catheter Maintenance Bag below level of bladder;Catheter secured;Drainage bag/tubing not touching floor;Insertion date on drainage bag;No dependent loops;Seal intact 09/23/2017  7:35 AM  Collection Container Standard drainage bag 09/23/2017  7:35 AM  Securement Method Securing device (Describe) 09/23/2017  7:35 AM  Urinary Catheter Interventions Unclamped 09/23/2017  2:00 AM  Output (mL) 35 mL 09/23/2017  5:00 AM    Anti-infectives:  Anti-infectives (From admission, onward)   Start     Dose/Rate Route Frequency Ordered Stop   09/22/17 1430  ciprofloxacin (CIPRO) IVPB 400 mg     400 mg 200 mL/hr over 60 Minutes Intravenous  Once 09/22/17 1421 09/22/17 1721   09/22/17 1415  ciprofloxacin (CIPRO) IVPB 400 mg  Status:  Discontinued      400 mg 200 mL/hr over 60 Minutes Intravenous Every 12 hours 09/22/17 1414 09/22/17 1421   09/22/17 0600  cefTRIAXone (ROCEPHIN) 1 g in sodium chloride 0.9 % 100 mL IVPB  Status:  Discontinued     1 g 200 mL/hr over 30 Minutes Intravenous Every 24 hours 09/21/17 1549 09/22/17 0354   09/22/17 0354  cefTRIAXone (ROCEPHIN) 2 g in sodium chloride 0.9 % 100 mL IVPB  Status:  Discontinued     2 g 200 mL/hr over 30 Minutes Intravenous Every 24 hours 09/22/17 0354 09/23/17 1117   09/21/17 1430  vancomycin (VANCOCIN) IVPB 1000 mg/200 mL premix     1,000 mg 200 mL/hr over 60 Minutes Intravenous  Once 09/21/17 1429 09/21/17 1644   09/21/17 1430  piperacillin-tazobactam (ZOSYN) IVPB 3.375 g     3.375 g 100 mL/hr over 30 Minutes Intravenous  Once 09/21/17 1429 09/21/17 1543      Microbiology: Results for orders placed or performed during the hospital encounter of 09/21/17  Urine culture     Status: None   Collection Time: 09/21/17  2:31 PM  Result Value Ref Range Status   Specimen Description   Final    URINE, RANDOM Performed at [facility], [address] [place], [place] [address]    Special Requests   Final    NONE Performed at [facility], [address] [place], [place] [address]    Culture   Final    NO GROWTH Performed at [facility],  [address], [place], [place] [address]    Report Status 09/22/2017 FINAL  Final  Blood culture (routine x 2)     Status: None   Collection Time: 09/21/17  2:50 PM  Result Value Ref Range Status   Specimen Description BLOOD Blood Culture adequate volume  Final   Special Requests   Final    BOTTLES DRAWN AEROBIC AND ANAEROBIC BLOOD RIGHT ARM   Culture   Final    NO GROWTH 5 DAYS Performed at [facility], [address] [place], [place] [address]    Report Status 09/26/2017 FINAL  Final  Blood culture (routine x 2)     Status: None   Collection Time: 09/21/17  2:50 PM  Result Value Ref Range  Status   Specimen Description BLOOD BLOOD LEFT HAND  Final   Special Requests BOTTLES DRAWN AEROBIC AND ANAEROBIC  Final   Culture   Final    NO GROWTH 5 DAYS Performed at [facility], [address], [place], [place] [address]    Report Status 09/26/2017 FINAL  Final  MRSA PCR Screening     Status: None   Collection Time: 09/22/17  7:22 AM  Result Value Ref Range Status   MRSA by PCR NEGATIVE NEGATIVE Final    Comment:        The GeneXpert MRSA Assay (FDA approved for NASAL specimens only), is one component of a comprehensive MRSA colonization surveillance program. It is not intended to diagnose MRSA infection nor to guide or monitor treatment for MRSA infections. Performed at [facility], [address], [place], [place] [address]    Studies: Ct Abdomen Pelvis Wo Contrast  Result Date: 09/22/2017 CLINICAL DATA:  Acute renal failure. EXAM: CT ABDOMEN AND PELVIS WITHOUT CONTRAST TECHNIQUE: Multidetector CT imaging of the abdomen and pelvis was performed following the standard protocol without IV contrast. COMPARISON:  06/28/2011 FINDINGS: Lower chest: Small bilateral pleural effusions with associated atelectasis. Calcified plaque over the right coronary artery. Hepatobiliary: Gallbladder is contracted. Liver and biliary tree are normal. Pancreas: Normal. Spleen: Normal. Adrenals/Urinary Tract: Adrenal glands are normal. Kidneys are normal in size with symmetric bilateral mild hydroureteronephrosis. No renal stones. 1.4 cm cyst over the upper pole right kidney. Foley catheter is present within a decompressed bladder as there is increased density to the fluid within the bladder possibly representing hemorrhagic debris in may be partially responsible for the hydroureteronephrosis. Stomach/Bowel: Stomach and small bowel are within normal. Appendix is not visualized. Evidence of patient's previous partial right colectomy. Vascular/Lymphatic: Minimal calcified plaque over  the abdominal aorta. No adenopathy. Reproductive: Within normal. Other: Left femoral venous catheter is present with tip over the left external iliac pain near the junction to the common iliac vein. There is intermediate density material over the left pelvis which crosses midline at and just above the level of the seminal vesicles. This likely represents hemorrhagic debris. This encompasses the distal left ureter and is also adjacent the distal right ureter lesser degree and may be partially responsible for the bilateral hydroureteronephrosis. Musculoskeletal: Degenerative change of the spine and hips. IMPRESSION: Bilateral symmetric mild hydroureteronephrosis. Foley catheter present within a collapsed bladder as there is increased density material within the bladder possibly hemorrhagic debris. There is also increased density material over the left pelvis with some crossing midline adjacent both distal ureters left worse than right likely hemorrhagic debris. These factors may be responsible for the hydroureteronephrosis. Small bilateral pleural effusions with associated bibasilar atelectasis. 1.4 cm right  renal cyst. Previous partial right colectomy. Left femoral venous catheter with tip over the external iliac vein near the junction to common iliac vein. Electronically Signed   By: Marin Olp M.D.   On: 09/22/2017 09:58   Dg Chest 1 View  Result Date: 09/21/2017 CLINICAL DATA:  Altered mental status. EXAM: CHEST  1 VIEW COMPARISON:  03/19/2014 FINDINGS: Lungs are adequately inflated and otherwise clear. Cardiomediastinal silhouette and remainder the exam is unchanged. IMPRESSION: No active disease. Electronically Signed   By: Marin Olp M.D.   On: 09/21/2017 14:54   Ct Head Wo Contrast  Result Date: 09/21/2017 CLINICAL DATA:  Altered mental status.  Previous CVA EXAM: CT HEAD WITHOUT CONTRAST TECHNIQUE: Contiguous axial images were obtained from the base of the skull through the vertex without  intravenous contrast. COMPARISON:  Jun 10, 2012 FINDINGS: Brain: There is mild diffuse atrophy. There is a degree of ex vacuo phenomenon involving the anterior horn of the left lateral ventricle. There is no intracranial mass, hemorrhage, extra-axial fluid collection, or midline shift. There is evidence of a prior infarct involving portions of the superior, anterior left temporal lobe as well as much of the anterior left basal ganglia. Elsewhere there is mild small vessel disease in the centra semiovale bilaterally. No acute infarct is demonstrated on this study. Vascular: There is no evident hyperdense vessel. There is no appreciable vascular calcification. Skull: The bony calvarium  appears intact. Sinuses/Orbits: There is mucosal thickening in several ethmoid air cells. Other visualized paranasal sinuses are clear. Orbits appear symmetric bilaterally. Other: Mastoid air cells are clear. IMPRESSION: Mild atrophy. Prior infarct involving much of the left basal ganglia, particularly anteriorly, as well as a portion of the superior left temporal lobe anteriorly. Elsewhere there is mild periventricular small vessel disease. No acute infarct is evident. No mass or hemorrhage. There is mucosal thickening in several ethmoid air cells. Electronically Signed   By: Lowella Grip III M.D.   On: 09/21/2017 14:17   US Renal  Result Date: 09/21/2017 CLINICAL DATA:  67 y/o M; acute kidney injury. History of colon cancer. EXAM: RENAL / URINARY TRACT ULTRASOUND COMPLETE COMPARISON:  None. FINDINGS: Right Kidney: Length: 12.0. Mildly increased cortical echogenicity. Right kidney upper pole 1.5 cm and mid pole 1.8 cm simple cysts. Moderate hydronephrosis. Left Kidney: Length: 11.9. Mild increased cortical echogenicity. No mass. Moderate hydronephrosis. Bladder: Thickened bladder wall to 1.7 cm. IMPRESSION: 1. Moderate bilateral hydronephrosis. 2. Mild increased echogenicity of the renal cortices compatible with medical renal  disease. 3. Bladder wall thickening may reflect cystitis or sequelae of chronic outflow obstruction and underdistention. Electronically Signed   By: Kristine Garbe M.D.   On: 09/21/2017 18:25   Dg Chest Port 1 View  Result Date: 09/26/2017 CLINICAL DATA:  Arrhythmia. EXAM: PORTABLE CHEST 1 VIEW COMPARISON:  Radiographs 09/23/2017, as well as prior exams FINDINGS: Heart is normal in size. Normal mediastinal contours. Continuing improvement in aeration with decreasing patchy and nodular opacities from prior exam. No new airspace disease. No pleural fluid or pneumothorax. No acute osseous abnormalities. IMPRESSION: Improving aeration over the past 3 days, likely resolving pulmonary edema. No acute findings. Electronically Signed   By: Jeb Levering M.D.   On: 09/26/2017 04:27   Dg Chest Port 1 View  Result Date: 09/23/2017 CLINICAL DATA:  Acute respiratory failure EXAM: PORTABLE CHEST 1 VIEW COMPARISON:  09/22/2017 FINDINGS: Cardiac shadow is stable. The lungs are mildly hyperinflated. Previously seen nodular pattern is again identified but significantly improved when  compare with the prior exam. Continued follow-up is recommended. Degenerative changes of the thoracolumbar spine are noted. IMPRESSION: Persistent but improved nodular pattern when compared with the prior exam. Electronically Signed   By: Inez Catalina M.D.   On: 09/23/2017 08:27   Dg Chest Port 1 View  Result Date: 09/22/2017 CLINICAL DATA:  Dyspnea and tachycardia. EXAM: PORTABLE CHEST 1 VIEW COMPARISON:  09/21/2017 FINDINGS: Heart size and pulmonary vascularity are normal. Since previous study, there is interval development of diffuse nodular perihilar infiltration bilaterally with some peribronchial thickening. This likely represents bronchopneumonia although edema or atypical infectious process could also have this appearance. No blunting of costophrenic angles. No pneumothorax. Mediastinal contours appear intact. IMPRESSION:  Interval development of diffuse bilateral nodular perihilar infiltrates with peribronchial thickening suggesting bronchopneumonia or edema. Electronically Signed   By: Lucienne Capers M.D.   On: 09/22/2017 04:29   Ir Nephrostomy Placement Left  Result Date: 09/22/2017 INDICATION: 84 year old with acute kidney injury, sepsis and bilateral hydronephrosis. Patient needs decompression of both kidneys. EXAM: PLACEMENT OF BILATERAL NEPHROSTOMY TUBES WITH ULTRASOUND AND FLUOROSCOPIC GUIDANCE COMPARISON:  None. MEDICATIONS: Ciprofloxacin 400 mg; The antibiotic was administered in an appropriate time frame prior to skin puncture. ANESTHESIA/SEDATION: Fentanyl 50 mcg IV; Versed 2 mg IV Moderate Sedation Time:  40 minutes The patient was continuously monitored during the procedure by the interventional radiology nurse under my direct supervision. CONTRAST:  10 mL-administered into the collecting system(s) FLUOROSCOPY TIME:  Fluoroscopy Time: 3 minutes and 30 seconds, 25 mGy COMPLICATIONS: None immediate. PROCEDURE: Informed written consent was obtained from the patient's sister after a thorough discussion of the procedural risks, benefits and alternatives. All questions were addressed. Maximal Sterile Barrier Technique was utilized including caps, mask, sterile gowns, sterile gloves, sterile drape, hand hygiene and skin antiseptic. A timeout was performed prior to the initiation of the procedure. Patient was placed prone. Both flanks were prepped and draped in sterile fashion. Procedure was technically difficult due to tachypnea and patient restlessness. Left kidney was identified with ultrasound. The left flank was anesthetized with 1% lidocaine. 21 gauge needle directed into a lower pole calyx with ultrasound guidance. Small amount of contrast was injected to confirm placement in the renal collecting system. 0.018 wire was advanced into the renal collecting system and Accustick dilator set was placed. J wire was  advanced down the ureter and the tract was dilated to accommodate a 10 [place] multipurpose drain. 10 French drain was reconstituted in the renal pelvis. Catheter was sutured to skin and attached to gravity bag. Attention was directed to the right kidney. Right kidney is more difficult to evaluate with ultrasound. Right flank was anesthetized with 1% lidocaine. 21 gauge needle was directed into a dilated midpole calyx with ultrasound guidance. Needle position confirmed within the collecting system with ultrasound. A 0.018 wire advanced easily down the ureter. An Accustick dilator set was placed. J wire was advanced down the ureter and the Accustick dilator set was replaced with a 10 [place] dilator. Subsequently, a 32 French drain was advanced over the wire and reconstituted in the right renal pelvis. It is difficult to reconstitute the pigtail drain in the renal pelvis to the small size of the collecting system. Catheter was attached to a gravity bag and sutured to the skin. Fluoroscopic and ultrasound images were taken and saved for documentation. FINDINGS: Mild to moderate bilateral hydronephrosis. Nephrostomy tubes are positioned in the renal pelvis bilaterally. IMPRESSION: Successful placement of bilateral percutaneous nephrostomy tubes with ultrasound and fluoroscopic guidance.  Electronically Signed   By: Markus Daft M.D.   On: 09/22/2017 15:43   Ir Nephrostomy Placement Right  Result Date: 09/22/2017 INDICATION: 49 year old with acute kidney injury, sepsis and bilateral hydronephrosis. Patient needs decompression of both kidneys. EXAM: PLACEMENT OF BILATERAL NEPHROSTOMY TUBES WITH ULTRASOUND AND FLUOROSCOPIC GUIDANCE COMPARISON:  None. MEDICATIONS: Ciprofloxacin 400 mg; The antibiotic was administered in an appropriate time frame prior to skin puncture. ANESTHESIA/SEDATION: Fentanyl 50 mcg IV; Versed 2 mg IV Moderate Sedation Time:  40 minutes The patient was continuously monitored during the procedure by  the interventional radiology nurse under my direct supervision. CONTRAST:  10 mL-administered into the collecting system(s) FLUOROSCOPY TIME:  Fluoroscopy Time: 3 minutes and 30 seconds, 25 mGy COMPLICATIONS: None immediate. PROCEDURE: Informed written consent was obtained from the patient's sister after a thorough discussion of the procedural risks, benefits and alternatives. All questions were addressed. Maximal Sterile Barrier Technique was utilized including caps, mask, sterile gowns, sterile gloves, sterile drape, hand hygiene and skin antiseptic. A timeout was performed prior to the initiation of the procedure. Patient was placed prone. Both flanks were prepped and draped in sterile fashion. Procedure was technically difficult due to tachypnea and patient restlessness. Left kidney was identified with ultrasound. The left flank was anesthetized with 1% lidocaine. 21 gauge needle directed into a lower pole calyx with ultrasound guidance. Small amount of contrast was injected to confirm placement in the renal collecting system. 0.018 wire was advanced into the renal collecting system and Accustick dilator set was placed. J wire was advanced down the ureter and the tract was dilated to accommodate a 10 [place] multipurpose drain. 10 French drain was reconstituted in the renal pelvis. Catheter was sutured to skin and attached to gravity bag. Attention was directed to the right kidney. Right kidney is more difficult to evaluate with ultrasound. Right flank was anesthetized with 1% lidocaine. 21 gauge needle was directed into a dilated midpole calyx with ultrasound guidance. Needle position confirmed within the collecting system with ultrasound. A 0.018 wire advanced easily down the ureter. An Accustick dilator set was placed. J wire was advanced down the ureter and the Accustick dilator set was replaced with a 10 [place] dilator. Subsequently, a 24 French drain was advanced over the wire and reconstituted in the right  renal pelvis. It is difficult to reconstitute the pigtail drain in the renal pelvis to the small size of the collecting system. Catheter was attached to a gravity bag and sutured to the skin. Fluoroscopic and ultrasound images were taken and saved for documentation. FINDINGS: Mild to moderate bilateral hydronephrosis. Nephrostomy tubes are positioned in the renal pelvis bilaterally. IMPRESSION: Successful placement of bilateral percutaneous nephrostomy tubes with ultrasound and fluoroscopic guidance. Electronically Signed   By: Markus Daft M.D.   On: 09/22/2017 15:43    Consults: Treatment Team:  Murlean Iba, MD Irine Seal, MD Dionisio David, MD Deboraha Sprang, MD   Subjective:    Overnight Issues: No acute events overnight as reported by nursing Pt denies any complaints Cardizem gtt weaned off Afebrile, on room air   Objective:  Vital signs for last 24 hours: Temp:  [97.7 F (36.5 C)-98.7 F (37.1 C)] 98.7 F (37.1 C) (08/23 0728) Pulse Rate:  [48-149] 64 (08/23 0728) Resp:  [10-26] 10 (08/23 0728) BP: (87-163)/(50-98) 148/72 (08/23 0728) SpO2:  [95 %-100 %] 99 % (08/23 0728)  Hemodynamic parameters for last 24 hours:    Intake/Output from previous day: 08/22 0701 - 08/23 0700 In:  4365.2 [P.O.:480; I.V.:3650.2] Out: 2075 [Urine:2075]  Intake/Output this shift: Total I/O In: 356.2 [I.V.:222.3; IV Piggyback:133.8] Out: 300 [Urine:300]  Vent settings for last 24 hours:    Physical Exam:  Vital signs: Please see the above listed vital signs General:          Acute on chronically ill appearing male, laying in bed, on room air, in NAD HEENT: Atraumatic, normocephalic, neck supple, no JVD, Pupils PERRL 3 mm bilaterally  Cardiovascular:     RRR, s1s2,  no M/R/G, 2+ pulses Pulmonary: Clear bilaterally,no wheezing, even, nonlabored, normal effort Abdominal: Soft, nontender, nondistended, BS+ x4 Extremities: No deformities, 1+ bilateral LE edema NEURO:         Awake  and alert, Oriented to person and place, follows commands, no focal deficits  Assessment/Plan:   Renal failure secondary to Bilateral Hydronephrosis>>slowly improving, Creatinine down to 2.25 -Nephrology following, appreciate input -S/p HD and percutaneous decompression -Monitor I&O's / urinary output -Follow BMP -Ensure adequate renal perfusion -Avoid nephrotoxic agents as able -Replace electrolytes as indicated -K currently being replaced, recheck BMP  Hypernatremia>>Trended down to 147 8/23 -Nephrology following, 0.225% NS per Nephrology  Hypokalemia  -Receiving IV replacement 8/23 -Recheck serum K  Hypomagnesemia -Received replacement 8/22, follow up serum Mg pending this am  Hypophosphatemia -Replaced 8/22, follow up serum Phosphorus pending this am  Leukocytosis -Completed course of Rocephin -Monitor fever curve -Trend WBC's  Anemia without evidence of active bleeding>>improved -S/p 1 unit pRBC's 8/22 -Monitor for s/sx of bleeding -Trend CBC -Transfuse for Hgb<7  Acute Encephalopathy, ? Secondary to Hypernatremia>>slowly improving -Provide supportive care -Lights on during the day -Promote normal wake/sleep cycle -Avoid sedating meds as able  Hyperglycemia. -CBG's -Currently on insulin gtt, will convert to SSI and Lantus when parameters met   Atrial Fibrillation w/ RVR & Tachybrady syndrome>>rate better controlled 8/23 Mildly Elevated troponin, likely demand Ischemia in setting of sustained A-fib w/ RVR -Cardiology following, appreciate input -Pt placed on PO Amiodarone by Dr. Caryl Comes on 8/22 -Continue PO Propanolol and digoxin -Per Dr. Caryl Comes, pt is not a candidate for AV node ablation and permanent pacemaker placement at this time  On telemetry, pt with ST depression and T wave inversion this am 8/23.  Previous EKG from 8/22 with some ST depression and T wave inversion.  Pt denies chest pain or SOB, in no acute distress.  Discussed with Dr. Jefferson Fuel, will  obtain STAT EKG and trend Troponin.   Disposition: Stepdown Goals of care: Full code VTE prophylaxis: SQ Heparin Updates: No family at bedside for update during NP rounds 09/27/17.  Darel Hong, AGACNP-BC [facility] Pager: [phone]  09/27/2017   Patient ID: Jordan [name], male   DOB: 1951-01-13, 67 y.o.   MRN: [id number]

## 2017-09-27 NOTE — Progress Notes (Signed)
[facility]  ROUNDING NOTE   Subjective:  Patient seen at bedside. Renal function improving but slowly now. Creatinine down to 2.25. Serum sodium better at 147.   Objective:  Vital signs in last 24 hours:  Temp:  [97.8 F (36.6 C)-98.7 F (37.1 C)] 98.7 F (37.1 C) (08/23 0728) Pulse Rate:  [48-132] 81 (08/23 0920) Resp:  [10-26] 14 (08/23 0920) BP: (87-163)/(50-98) 150/70 (08/23 0920) SpO2:  [95 %-100 %] 100 % (08/23 0920)  Weight change:  Filed Weights   09/23/17 1454 09/24/17 0610 09/25/17 0445  Weight: 79.6 kg 77 kg 74.5 kg    Intake/Output: I/O last 3 completed shifts: In: 5159.1 [P.O.:480; I.V.:4444.1; Other:235] Out: 2850 [Urine:2850]   Intake/Output this shift:  Total I/O In: 725.6 [I.V.:539.5; IV Piggyback:186.1] Out: 300 [Urine:300]  Physical Exam: General: No acute distress, laying in bed  Head: Normocephalic, atraumatic. Moist oral mucosal membranes  Eyes: Anicteric  Neck: Supple, trachea midline  Lungs:  Clear to auscultation, normal effort  Heart: S1S2 no rubs  Abdomen:  Soft, nontender, bowel sounds present  Extremities: No peripheral edema.  Neurologic: Awake, alert  Skin: No lesions       Basic Metabolic Panel: Recent Labs  Lab 09/22/17 0415 09/22/17 2112  09/24/17 2308 09/25/17 0054 09/25/17 1757 09/26/17 0502 09/27/17 0447  NA 136 140   < > 151* 150* 151* 152* 147*  K 4.7 3.4*   < > 3.4* 3.5 3.3* 3.4* 3.3*  CL 107 98   < > 113* 113* 113* 115* 108  CO2 9* 18*   < > 24 26 28 27  32  GLUCOSE 223* 279*   < > 338* 348* 353* 211* 178*  BUN 122* 94*   < > 37* 36* 30* 25* 25*  CREATININE 13.72* 9.18*   < > 3.39* 3.28* 2.64* 2.32* 2.25*  CALCIUM 8.2* 8.1*   < > 9.1 9.0 9.1 8.9 8.2*  MG 2.5* 2.2  --  1.8  --   --  1.9 1.8  PHOS 8.7* 6.7*  --  2.8  --   --  2.2* 2.5   < > = values in this interval not displayed.    Liver Function Tests: Recent Labs  Lab 09/21/17 1342 09/22/17 0415 09/23/17 0425 09/26/17 0502  AST 16  12* 12* 20  ALT 18 15 12 12   ALKPHOS 92 70 80 80  BILITOT 2.3* 1.7* 1.7* 1.0  PROT 7.5 6.1* 6.0* 6.7  ALBUMIN 3.2* 2.6* 2.3* 2.8*   No results for input(s): LIPASE, AMYLASE in the last 168 hours. No results for input(s): AMMONIA in the last 168 hours.  CBC: Recent Labs  Lab 09/21/17 1342 09/22/17 0415 09/23/17 0425 09/24/17 0448 09/26/17 0502 09/26/17 1915 09/27/17 0447  WBC 32.3* 25.4* 13.4* 10.7* 7.0  --  11.6*  NEUTROABS 30.4* 23.0*  --   --   --   --   --   HGB 10.2* 9.5* 9.3* 9.9* 6.1* 11.7* 10.9*  HCT 30.8* 27.8* 27.0* 28.1* 18.2* 34.5* 31.4*  MCV 89.8 87.6 85.9 87.6 90.3  --  88.0  PLT 538* 412 363 322 194  --  269    Cardiac Enzymes: Recent Labs  Lab 09/21/17 1342 09/22/17 0415 09/22/17 2112 09/26/17 0502 09/26/17 0942 09/26/17 1550  CKTOTAL 129  --   --   --   --   --   TROPONINI <0.03 0.06* 0.09* 0.05* 0.04* 0.03*    BNP: Invalid input(s): POCBNP  CBG: Recent Labs  Lab  09/27/17 0527 09/27/17 0630 09/27/17 0732 09/27/17 0834 09/27/17 0937  GLUCAP 140* 149* 118* 87 141*    Microbiology: Results for orders placed or performed during the hospital encounter of 09/21/17  Urine culture     Status: None   Collection Time: 09/21/17  2:31 PM  Result Value Ref Range Status   Specimen Description   Final    URINE, RANDOM Performed at [facility], [address], [place], [place] [address]    Special Requests   Final    NONE Performed at [facility], [address] [place], [place] [address]    Culture   Final    NO GROWTH Performed at [facility], [place] [address] [place], [place] [address]    Report Status 09/22/2017 FINAL  Final  Blood culture (routine x 2)     Status: None   Collection Time: 09/21/17  2:50 PM  Result Value Ref Range Status   Specimen Description BLOOD Blood Culture adequate volume  Final   Special Requests   Final    BOTTLES DRAWN AEROBIC AND ANAEROBIC BLOOD RIGHT ARM   Culture   Final    NO  GROWTH 5 DAYS Performed at [facility], [address] [place], [place] [address]    Report Status 09/26/2017 FINAL  Final  Blood culture (routine x 2)     Status: None   Collection Time: 09/21/17  2:50 PM  Result Value Ref Range Status   Specimen Description BLOOD BLOOD LEFT HAND  Final   Special Requests BOTTLES DRAWN AEROBIC AND ANAEROBIC  Final   Culture   Final    NO GROWTH 5 DAYS Performed at [facility], [address], [place], [place] [address]    Report Status 09/26/2017 FINAL  Final  MRSA PCR Screening     Status: None   Collection Time: 09/22/17  7:22 AM  Result Value Ref Range Status   MRSA by PCR NEGATIVE NEGATIVE Final    Comment:        The GeneXpert MRSA Assay (FDA approved for NASAL specimens only), is one component of a comprehensive MRSA colonization surveillance program. It is not intended to diagnose MRSA infection nor to guide or monitor treatment for MRSA infections. Performed at [facility], [address] [place],  [address]     Coagulation Studies: No results for input(s): LABPROT, INR in the last 72 hours.  Urinalysis: No results for input(s): COLORURINE, LABSPEC, PHURINE, GLUCOSEU, HGBUR, BILIRUBINUR, KETONESUR, PROTEINUR, UROBILINOGEN, NITRITE, LEUKOCYTESUR in the last 72 hours.  Invalid input(s): APPERANCEUR    Imaging: Dg Chest Port 1 View  Result Date: 09/26/2017 CLINICAL DATA:  Arrhythmia. EXAM: PORTABLE CHEST 1 VIEW COMPARISON:  Radiographs 09/23/2017, as well as prior exams FINDINGS: Heart is normal in size. Normal mediastinal contours. Continuing improvement in aeration with decreasing patchy and nodular opacities from prior exam. No new airspace disease. No pleural fluid or pneumothorax. No acute osseous abnormalities. IMPRESSION: Improving aeration over the past 3 days, likely resolving pulmonary edema. No acute findings. Electronically Signed   By: Jeb Levering M.D.   On: 09/26/2017 04:27      Medications:   . diltiazem (CARDIZEM) infusion Stopped (09/26/17 1700)  . insulin (NOVOLIN-R) infusion 1.4 mL/hr at 09/27/17 0939  .  sodium bicarbonate infusion 1/4 NS 1000 mL 150 mL/hr at 09/27/17 0939   . amiodarone  400 mg Oral BID  . Chlorhexidine Gluconate Cloth  6 each Topical Q0600  .  digoxin  0.0625 mg Oral Daily  . famotidine  20 mg Oral Daily  . folic acid  1 mg Oral Daily  . heparin injection (subcutaneous)  5,000 Units Subcutaneous Q8H  . insulin aspart  0-5 Units Subcutaneous QHS  . insulin aspart  0-9 Units Subcutaneous TID WC  . insulin aspart  4 Units Subcutaneous TID WC  . insulin glargine  28 Units Subcutaneous Daily  . multivitamin with minerals  1 tablet Oral Daily  . propranolol  80 mg Oral TID  . rosuvastatin  5 mg Oral q1800  . tamsulosin  0.4 mg Oral QPC supper   acetaminophen **OR** acetaminophen, haloperidol lactate  Assessment/ Plan:  67 y.o. 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 on 09/21/2017 for evaluation of Lethargy and weaknbess.   Patient appears to have developed acute kidney injury from bilateral hydronephrosis and also underlying possible sepsis and dehydration.  Outpatient medications included benazepril.  1.  Acute renal failure.  Baseline creatinine 1.1 from April 2019 -Rate of creatinine correction has slowed a bit.  Creatinine currently 2.25.  Continue IV fluid hydration.  2.  Bilateral hydronephrosis and chronic bladder wall thickening.  Good urine output noted.  Additional plan as per urology.  3.  Severe acidosis She continued on bicarbonate drip.  Consider stopping tomorrow.  4.    Hypokalemia.  Potassium remains slightly low.  Repletion as per critical care protocol.  5.  Hypernatremia.  Serum sodium down to 147.   Continue bicarbonate infusion.   LOS: 6 Kabria Hetzer 8/23/20199:51 AM

## 2017-09-27 NOTE — Progress Notes (Signed)
[facility] NAME: Jordan [name]    MR#:  [id number]  DATE OF BIRTH:  1950/05/06  SUBJECTIVE:   More awake off all drips now  Sugars up. Good UOP REVIEW OF SYSTEMS:   Review of Systems  Constitutional: Negative for chills, fever and weight loss.  HENT: Negative for ear discharge, ear pain and nosebleeds.   Eyes: Negative for blurred vision, pain and discharge.  Respiratory: Negative for sputum production, shortness of breath, wheezing and stridor.   Cardiovascular: Negative for chest pain, palpitations, orthopnea and PND.  Gastrointestinal: Negative for abdominal pain, diarrhea, nausea and vomiting.  Genitourinary: Negative for frequency and urgency.  Musculoskeletal: Negative for back pain and joint pain.  Neurological: Negative for sensory change, speech change, focal weakness and weakness.  Psychiatric/Behavioral: Negative for depression and hallucinations. The patient is not nervous/anxious.    DRUG ALLERGIES:   Allergies  Allergen Reactions  . Amiodarone     VITALS:  Blood pressure 130/67, pulse (!) 50, temperature 97.6 F (36.4 C), resp. rate 14, height 5\' 10"  (1.778 m), weight 74.5 kg, SpO2 100 %.  PHYSICAL EXAMINATION:   Physical Exam  GENERAL:  67 y.o.-year-old patient lying in the bed with no acute distress. Critically ill EYES: Pupils equal, round, reactive to light and accommodation. No scleral icterus. Extraocular muscles intact.  HEENT: Head atraumatic, normocephalic. Oropharynx and nasopharynx clear.  NECK:  Supple, no jugular venous distention. No thyroid enlargement, no tenderness.  LUNGS: Normal breath sounds bilaterally, no wheezing, rales, rhonchi. No use of accessory muscles of respiration.  CARDIOVASCULAR: S1, S2 normal. No murmurs, rubs, or gallops.  ABDOMEN: Soft, nontender, nondistended. Bowel sounds present. No organomegaly or mass.  EXTREMITIES: No cyanosis, clubbing or edema b/l.     NEUROLOGIC: unable to assess due to mentation. Moves all extremities well   PSYCHIATRIC:  patient is lethargic and very sleepy SKIN: No obvious rash, lesion, or ulcer.   LABORATORY PANEL:  CBC Recent Labs  Lab 09/27/17 0447  WBC 11.6*  HGB 10.9*  HCT 31.4*  PLT 269    Chemistries  Recent Labs  Lab 09/26/17 0502 09/27/17 0447  NA 152* 147*  K 3.4* 3.3*  CL 115* 108  CO2 27 32  GLUCOSE 211* 178*  BUN 25* 25*  CREATININE 2.32* 2.25*  CALCIUM 8.9 8.2*  MG 1.9 1.8  AST 20  --   ALT 12  --   ALKPHOS 80  --   BILITOT 1.0  --    Cardiac Enzymes Recent Labs  Lab 09/27/17 1412  TROPONINI <0.03   RADIOLOGY:  Dg Chest Port 1 View  Result Date: 09/26/2017 CLINICAL DATA:  Arrhythmia. EXAM: PORTABLE CHEST 1 VIEW COMPARISON:  Radiographs 09/23/2017, as well as prior exams FINDINGS: Heart is normal in size. Normal mediastinal contours. Continuing improvement in aeration with decreasing patchy and nodular opacities from prior exam. No new airspace disease. No pleural fluid or pneumothorax. No acute osseous abnormalities. IMPRESSION: Improving aeration over the past 3 days, likely resolving pulmonary edema. No acute findings. Electronically Signed   By: Jeb Levering M.D.   On: 09/26/2017 04:27   ASSESSMENT AND PLAN:  Jordan [name]  is a 67 y.o. male brought in by family secondary to altered mental status.  The patient has a history of stroke and has trouble speaking his words and has baseline right-sided weakness.  He states his appetite has been poor for the past week.  In the ER, his  creatinine was found to be 14.94 with a potassium of 6.1.   1.  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  bladder scan showing only 80 cc in the bladder.  IV fluid hydration   Likely acute kidney injury from poor appetite and obstructive uropathy.  Hold nephrotoxic medications. -Patient irecieved urgent hemodialysis through right femoral HD Access--Now on  hold -Seen by urology. Patient is s/p  bilateral nephrectomy tube secondary to hydronephrosis with possible obstructive mass in the bladder as opposed to hemorrhagic debris--will need cystoscopy with bladder mass biopsy per Dr Erlene Quan -creat 14.0--9.18--7.89--4.14--3.28--2.1 -UC no growth -d/c foley -Dr Erlene Quan will see next week and cystoscopy as out pt. Ok to remove foley  2.  Hyperkalemia.  Likely from acute kidney injury. -  IV fluid hydration.  Monitor on telemetry.  pt got a dose of lokelma and IV calcium. -improved  3.  Type 1 diabetes mellitus.   -sugars going up.now off insulin gtt  4. Acute on chronic atrial fibrillation.  With his creatinine clearance being where it is the sotalol is contraindicated.    -continue his  cardizem and digoxin -currently in SR -now added amiodarone by Dr Vernard Gambles long term anticoagulation according to Dr khan--d/ced heparin gtt and monitor  5.  History of stroke with right-sided weakness.  Physical therapy consultation.  6.  Essential hypertension on propranolol.  Hold benazepril.  7.  Hyperlipidemia unspecified.  On low-dose Crestor.  CPK normal range  8.  Falls.  Physical therapy evaluation  Transfer to floor  Overall pt improving slowly  Case discussed with Care Management/Social Worker. Management plans discussed with the patient, family and they are in agreement.  CODE STATUS: full  DVT Prophylaxis: heparin  TOTAL TIME TAKING CARE OF THIS PATIENT: 30* minutes.  >50% time spent on counselling and coordination of care  POSSIBLE D/C IN *few* DAYS, DEPENDING ON CLINICAL CONDITION.  Note: This dictation was prepared with Dragon dictation along with smaller phrase technology. Any transcriptional errors that result from this process are unintentional.  Fritzi Mandes M.D on 09/27/2017 at 5:18 PM  Between 7am to 6pm - Pager - [phone]  After 6pm go to [email or web address] - password [email or web address] [facility]  Office   [phone]  CC: Primary care physician; Albina Billet, MDPatient ID: Jordan [name], male   DOB: 1951-01-02, 67 y.o.   MRN: [id number]

## 2017-09-27 NOTE — Progress Notes (Addendum)
Inpatient Diabetes Program Recommendations  AACE/ADA: New Consensus Statement on Inpatient Glycemic Control (2015)  Target Ranges:  Prepandial:   less than 140 mg/dL      Peak postprandial:   less than 180 mg/dL (1-2 hours)      Critically ill patients:  140 - 180 mg/dL   Results for Jordan [name], Jordan [name] (MRN [id number]) as of 09/27/2017 07:57  Ref. Range 09/27/2017 00:25 09/27/2017 01:29 09/27/2017 02:32 09/27/2017 02:38 09/27/2017 03:40 09/27/2017 04:23 09/27/2017 05:27 09/27/2017 06:30 09/27/2017 07:32  Glucose-Capillary Latest Ref Range: 70 - 99 mg/dL 145 (H) 165 (H) 200 (H) 214 (H) 178 (H) 237 (H) 140 (H) 149 (H) 118 (H)   Home DM Meds: Lantus 28 units daily                             Humalog 10 units Breakfast/ 8 units Lunch/ 12 units Dinner                             Humalog 4 units with Snacks                             Humalog 1-6 units per SSI  Current Orders: IV Insulin Drip       Note patient restarted on the IV Insulin drip yesterday for extremely elevated CBGs.  Drip started around 4pm.    Note that patient saw his Endocrinologist Dr. Gabriel Carina with [facility] on 06/05/2017--At that visit pt was instructed to take the following: Lantus 28 units daily Humalog 10 units Breakfast/ 8 units Lunch/ 12 units Dinner Humalog 4 units with Snacks Humalog 1-6 units per SSI     MD- Patient with Type 1 DM per Dr. Joycie Peek records.  Needs more basal insulin to prevent ketoacidosis.  Do Not recommend transitioning off the IV Insulin drip with the ICU Glycemic Control order set orders.  Instead, recommend the below transition Insulin regimen:  Please consider the following when transitioning off the IV Insulin drip today:  1. Start Lantus 28 units daily--Make sure to give Lantus at least 1 hour prior to d/c of IV Insulin drip  2. Start Novolog Sensitive Correction Scale/ SSI (0-9 units) TID AC + HS  3. Start Novolog Meal Coverage: Novolog 4 units TID with meals   (Please add  the following Hold Parameters: Hold if pt eats <50% of meal, Hold if pt NPO)       --Will follow patient during hospitalization--  Wyn Quaker RN, MSN, CDE Diabetes Coordinator Inpatient Glycemic Control Team Team Pager: [phone] (8a-5p)

## 2017-09-27 NOTE — Evaluation (Signed)
Occupational Therapy Evaluation Patient Details Name: Jordan [name] MRN: [id number] DOB: [birth year] Today's Date: 09/27/2017    History of Present Illness Jordan [name] is an 67 y.o. male. with a medical history of type 1 diabetes, previous CVA, atrial fibrillation, colon cancer status post resection, and hyperthyroidism who was admitted with acute renal failure, acute encephalopathy, and hyperkalemia.     Clinical Impression   Pt is 67 year old male who presents with acute renal failure, acute encephalopathy and hyperkalemia.  He lives at home alone and has a sister named Joaquim Lai who lives around the corner from him.  He lives in a mobile home with  stairs and standard bathroom with no grab bars or adaptations to toilet or tub with shower.  He is alert and oriented to person only.  He has a history of old CVA with R sided weakness which is still present.  He is able to follow one step commands for basic ADLs to brush his teeth and wash his face after set up.  Fine motor skills are slow but able to completed bilateral coordination tasks.  He is not able to follow steps to assess sensation or finger to nose test.  He has a lot of bruising and RUE warm to touch (NSG notified) and swollen and placed on pillows.  His R hand and forearm are cold to touch.  Pt given foam utensil holders for self feeding and demonstrated teach back. He has limited AROM in BUEs with shoulder flexion to about 85 degrees.  Functional endurance for ADLs is limited and sitting on EOB only tolerated for only a few minutes.  Pt requires mod assist for LB dressing due to safety and decreased balance when leaning forward.  Min assist for UB dressing skills and grooming.   Pt would benefit from skilled OT services to address ADL training, fine motor skills training, adaptive equipment training, strengthening, and family ed and training.  Pt would benefit from SNF for continued rehab after discharge from hospital.    Follow Up  Recommendations  SNF    Equipment Recommendations       Recommendations for Other Services       Precautions / Restrictions Precautions Precautions: Fall Precaution Comments: Aphasia Restrictions Weight Bearing Restrictions: No Other Position/Activity Restrictions: Nephrostomy bags bilat. R sided weakness      Mobility Bed Mobility                  Transfers                      Balance                                           ADL either performed or assessed with clinical judgement   ADL Overall ADL's : Needs assistance/impaired Eating/Feeding: Set up;Minimal assistance Eating/Feeding Details (indicate cue type and reason): given red foam utensil holders for spoon and fork to help with holding and feeding independence Grooming: Wash/dry hands;Wash/dry face;Oral care;Set up;Minimal assistance;Cueing for sequencing;Bed level Grooming Details (indicate cue type and reason): cues to sequence and not perseverate on brushing and cues to not drink large gulp of water from cup due to hx of dysphagia Upper Body Bathing: Moderate assistance;Set up;Bed level   Lower Body Bathing: Moderate assistance;Set up   Upper Body Dressing : Minimal assistance;Set up   Lower Body  Dressing: Moderate assistance;Set up                       Vision   Additional Comments: unable to accurately screen but functionally he appears to not have any visual deficits during ADLs     Perception     Praxis      Pertinent Vitals/Pain Pain Assessment: No/denies pain     Hand Dominance (unable to determine since he held up L hand  but said right)   Extremity/Trunk Assessment Upper Extremity Assessment Upper Extremity Assessment: RUE deficits/detail RUE Deficits / Details: R weakness from prior  CVA and has bruising and increased temperature to touch in RUE above elbow with edema and placed up on pillows and NSG notified.  His R hand and forearm very  cold to touch.   RUE Sensation: (unable to accurately assess due to inconsistent responses but stated NO to question if he had numbness or tingling) RUE Coordination: decreased fine motor   Lower Extremity Assessment Lower Extremity Assessment: Defer to PT evaluation       Communication Communication Communication: Expressive difficulties   Cognition Arousal/Alertness: Awake/alert Behavior During Therapy: Flat affect Overall Cognitive Status: Impaired/Different from baseline Area of Impairment: Orientation;Attention;Following commands;Problem solving                 Orientation Level: Person Current Attention Level: Selective   Following Commands: Follows one step commands consistently     Problem Solving: Slow processing;Decreased initiation;Requires verbal cues General Comments: Perseverative with ADLS and needs cues to move to next step and sequence and problem solve.     General Comments       Exercises     Shoulder Instructions      Home Living Family/patient expects to be discharged to:: Private residence Living Arrangements: Alone Available Help at Discharge: Family Type of Home: Mobile home Home Access: Stairs to enter Entrance Stairs-Number of Steps: 5 Entrance Stairs-Rails: Right;Left;Can reach both Home Layout: One level     Bathroom Shower/Tub: Teacher, early years/pre: Standard Bathroom Accessibility: No   Home Equipment: Environmental consultant - 2 wheels;Cane - single point   Additional Comments: Pt is not a reliable historan due to aphasia and prior cognitive impairments.       Prior Functioning/Environment          Comments: Pt unable to provide details on prior functioning        OT Problem List: Decreased activity tolerance;Impaired UE functional use;Decreased range of motion;Decreased safety awareness;Impaired balance (sitting and/or standing);Decreased cognition;Decreased knowledge of precautions;Decreased strength;Increased edema       OT Treatment/Interventions: Self-care/ADL training;Energy conservation;Therapeutic activities;Patient/family education;Therapeutic exercise    OT Goals(Current goals can be found in the care plan section) Acute Rehab OT Goals Patient Stated Goal: pt wanting to brush his teeth OT Goal Formulation: With patient Time For Goal Achievement: 10/11/17 Potential to Achieve Goals: Good ADL Goals Pt Will Perform Eating: with set-up;with supervision;with adaptive utensils;sitting Pt Will Perform Upper Body Dressing: Independently;with set-up;sitting Pt Will Perform Lower Body Dressing: with set-up;with min assist;sit to/from stand Pt Will Transfer to Toilet: with set-up;with min assist;stand pivot transfer;bedside commode Pt/caregiver will Perform Home Exercise Program: Increased ROM;With written HEP provided;With Supervision;Both right and left upper extremity  OT Frequency: Min 2X/week   Barriers to D/C:    lives at home alone and sister lives near him       Co-evaluation  AM-PAC PT "6 Clicks" Daily Activity     Outcome Measure Help from another person eating meals?: A Little Help from another person taking care of personal grooming?: A Little Help from another person toileting, which includes using toliet, bedpan, or urinal?: A Lot Help from another person bathing (including washing, rinsing, drying)?: A Lot Help from another person to put on and taking off regular upper body clothing?: A Little Help from another person to put on and taking off regular lower body clothing?: A Lot 6 Click Score: 15   End of Session Equipment Utilized During Treatment: Other (comment)(red foam utensil holders) Nurse Communication: Other (comment)(pt given foam utensil holders for feeding skills)  Activity Tolerance: Patient tolerated treatment well Patient left: in bed;with call bell/phone within reach;with bed alarm set  OT Visit Diagnosis: Muscle weakness (generalized)  (M62.81);Hemiplegia and hemiparesis;Unsteadiness on feet (R26.81) Hemiplegia - Right/Left: Right Hemiplegia - dominant/non-dominant: Dominant(assumed it is dominant hand but not sure due to cognitive impairments)                Time: 5248-1859 OT Time Calculation (min): 33 min Charges:  OT General Charges $OT Visit: 1 Visit OT Evaluation $OT Eval Low Complexity: 1 Low OT Treatments $Self Care/Home Management : 8-22 mins  Chrys Racer, OTR/L ascom [phone] 09/27/17, 2:09 PM

## 2017-09-27 NOTE — Progress Notes (Signed)
  Speech Language Pathology Treatment: Dysphagia  Patient Details Name: Jordan [name] MRN: [id number] DOB: Mar 18, 1950 Today's Date: 09/27/2017 Time: 1250-1330 SLP Time Calculation (min) (ACUTE ONLY): 40 min  Assessment / Plan / Recommendation Clinical Impression  Pt seen for ongoing assessment of safe toleration of oral diet. Pt's overall medical presentation and alertness if much improved since initial evaluation. Pt continues to demonstrate decreased awareness and orientation to environment and situation. He can follow basic commands w/ general cues and reminders. He would benefit from a more formal Cognitive assessment once medial status and Encephalopathy have improved in order to determine safest discharge plan as pt lives at home alone prior to this admission; possible need for SNF placement initially for safety and therapy.  Pt was given po trials of mech soft foods and thin liquids VIA CUP in order to upgrade diet consistency today as appropriate. Pt appears to present w/ grossly adequate oropharyngeal phase swallowing function w/ no immediate, overt s/s of aspiration suggestive of an increased risk for aspiration w/ oral intake at this time when following general aspiration precautions. 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 Pt consumed po trials w/ no immediate, overt s/s of aspiration noted; clear vocal quality post trials and no decline in respiratory status during/post trials; O2 sats remained upper 90s. Edcuated pt on taking small, single sips vs consecutive sips(chugging); NO Straws. Oral phase was grossly wfl for bolus management and oral clearing b/t trials w/ increased textured foods. Pt took his time w/ all trials; encouraged him to fully clear using lingual sweeping b/t bites. Fairly consistent rotary chewing noted; no bolus loss. Pt was able to feed himself w/ setup given - he used his LUE often.  Recommend a Dysphagia level 3/Regular diet (meats cut, foods  moistened) w/ Thin liquids VIA CUP - NO Straws. Recommend general aspiration precautions; Pills in Puree for safer swallowing baseline. ST services will continue to monitor toleration of oral diet and given education as needed while admitted. NSG updated.     HPI  Pt is an 67 y.o. male.with a medical history of type 1 diabetes, previous CVA, atrial fibrillation, colon cancer status post resection, and hyperthyroidism who was admitted with acute renal failure, acute encephalopathy, and hyperkalemia.      SLP Plan  Continue with current plan of care(monitor for toleration of diet x1-2 then s/o)       Recommendations  Diet recommendations: Dysphagia 3 (mechanical soft);Thin liquid Liquids provided via: Cup;No straw Medication Administration: Whole meds with puree(for safer swallowing) Supervision: Patient able to self feed;Intermittent supervision to cue for compensatory strategies(as needed) Compensations: Minimize environmental distractions;Slow rate;Small sips/bites;Lingual sweep for clearance of pocketing;Follow solids with liquid Postural Changes and/or Swallow Maneuvers: Seated upright 90 degrees;Upright 30-60 min after meal                General recommendations: (Dietician f/u as needed) Oral Care Recommendations: Oral care BID;Patient independent with oral care(assist) Follow up Recommendations: None SLP Visit Diagnosis: Dysphagia, unspecified (R13.10)(improved) Plan: Continue with current plan of care(monitor for toleration of diet x1-2 then s/o)       GO                Orinda Kenner, MS, CCC-SLP Watson,Katherine 09/27/2017, 3:47 PM

## 2017-09-27 NOTE — Progress Notes (Signed)
SUBJECTIVE: Doing well   Vitals:   09/27/17 0728 09/27/17 0800 09/27/17 0910 09/27/17 0920  BP: (!) 148/72 (!) 150/80 136/73 (!) 150/70  Pulse: 64 72 85 81  Resp: 10 14 15 14   Temp: 98.7 F (37.1 C)     TempSrc: Oral     SpO2: 99% 100% 100% 100%  Weight:        Intake/Output Summary (Last 24 hours) at 09/27/2017 0959 Last data filed at 09/27/2017 1448 Gross per 24 hour  Intake 4850.87 ml  Output 2375 ml  Net 2475.87 ml    LABS: Basic Metabolic Panel: Recent Labs    09/26/17 0502 09/27/17 0447  NA 152* 147*  K 3.4* 3.3*  CL 115* 108  CO2 27 32  GLUCOSE 211* 178*  BUN 25* 25*  CREATININE 2.32* 2.25*  CALCIUM 8.9 8.2*  MG 1.9 1.8  PHOS 2.2* 2.5   Liver Function Tests: Recent Labs    09/26/17 0502  AST 20  ALT 12  ALKPHOS 80  BILITOT 1.0  PROT 6.7  ALBUMIN 2.8*   No results for input(s): LIPASE, AMYLASE in the last 72 hours. CBC: Recent Labs    09/26/17 0502 09/26/17 1915 09/27/17 0447  WBC 7.0  --  11.6*  HGB 6.1* 11.7* 10.9*  HCT 18.2* 34.5* 31.4*  MCV 90.3  --  88.0  PLT 194  --  269   Cardiac Enzymes: Recent Labs    09/26/17 0502 09/26/17 0942 09/26/17 1550  TROPONINI 0.05* 0.04* 0.03*   BNP: Invalid input(s): POCBNP D-Dimer: No results for input(s): DDIMER in the last 72 hours. Hemoglobin A1C: No results for input(s): HGBA1C in the last 72 hours. Fasting Lipid Panel: No results for input(s): CHOL, HDL, LDLCALC, TRIG, CHOLHDL, LDLDIRECT in the last 72 hours. Thyroid Function Tests: No results for input(s): TSH, T4TOTAL, T3FREE, THYROIDAB in the last 72 hours.  Invalid input(s): FREET3 Anemia Panel: No results for input(s): VITAMINB12, FOLATE, FERRITIN, TIBC, IRON, RETICCTPCT in the last 72 hours.   PHYSICAL EXAM General: Well developed, well nourished, in no acute distress HEENT:  Normocephalic and atramatic Neck:  No JVD.  Lungs: Clear bilaterally to auscultation and percussion. Heart: HRRR . Normal S1 and S2 without gallops  or murmurs.  Abdomen: Bowel sounds are positive, abdomen soft and non-tender  Msk:  Back normal, normal gait. Normal strength and tone for age. Extremities: No clubbing, cyanosis or edema.   Neuro: Alert and oriented X 3. Psych:  Good affect, responds appropriately  TELEMETRY: NSR ASSESSMENT AND PLAN: s/p Afib with RVR, continue amiodrone 400 po bid forr now and may go to telemetry.  Active Problems:   Acute kidney injury ([place])    Jordan Neu A, MD, [facility] 09/27/2017 9:59 AM

## 2017-09-27 NOTE — Progress Notes (Signed)
OT Cancellation Note  Patient Details Name: Jordan [name] MRN: [id number] DOB: [birth year]   Cancelled Treatment:    Reason Eval/Treat Not Completed: Patient at procedure or test/ unavailable(Nursing currently in with pt, and receiving a morning bath. Will reattempt OT initial eval at a later time, and a pt. is available.)  Harrel Carina, MS, OTR/L 09/27/2017, 9:34 AM

## 2017-09-28 LAB — GLUCOSE, CAPILLARY
GLUCOSE-CAPILLARY: 204 mg/dL — AB (ref 70–99)
GLUCOSE-CAPILLARY: 575 mg/dL — AB (ref 70–99)
GLUCOSE-CAPILLARY: 574 mg/dL — AB (ref 70–99)
GLUCOSE-CAPILLARY: 584 mg/dL — AB (ref 70–99)
GLUCOSE-CAPILLARY: 425 mg/dL — AB (ref 70–99)
Glucose-Capillary: 168 mg/dL — ABNORMAL HIGH (ref 70–99)
Glucose-Capillary: 514 mg/dL (ref 70–99)
Glucose-Capillary: 459 mg/dL — ABNORMAL HIGH (ref 70–99)

## 2017-09-28 LAB — BASIC METABOLIC PANEL
Anion gap: 8 (ref 5–15)
Anion gap: 7 (ref 5–15)
BUN: 21 mg/dL (ref 8–23)
BUN: 31 mg/dL — ABNORMAL HIGH (ref 8–23)
CALCIUM: 8.1 mg/dL — AB (ref 8.9–10.3)
CHLORIDE: 102 mmol/L (ref 98–111)
CO2: 27 mmol/L (ref 22–32)
CO2: 23 mmol/L (ref 22–32)
CREATININE: 1.91 mg/dL — AB (ref 0.61–1.24)
CREATININE: 2.32 mg/dL — AB (ref 0.61–1.24)
Calcium: 8.1 mg/dL — ABNORMAL LOW (ref 8.9–10.3)
Chloride: 106 mmol/L (ref 98–111)
GFR calc Af Amer: 40 mL/min — ABNORMAL LOW (ref >60)
GFR calc Af Amer: 32 mL/min — ABNORMAL LOW (ref >60)
GFR calc non Af Amer: 28 mL/min — ABNORMAL LOW (ref >60)
GFR, EST NON AFRICAN AMERICAN: 35 mL/min — AB (ref >60)
GLUCOSE: 172 mg/dL — AB (ref 70–99)
GLUCOSE: 565 mg/dL — AB (ref 70–99)
Potassium: 4.5 mmol/L (ref 3.5–5.1)
Potassium: 4.9 mmol/L (ref 3.5–5.1)
SODIUM: 141 mmol/L (ref 135–145)
Sodium: 132 mmol/L — ABNORMAL LOW (ref 135–145)

## 2017-09-28 LAB — BLOOD GAS, ARTERIAL
BICARBONATE: UNDETERMINED mmol/L (ref 20.0–28.0)
FIO2: 0.28
O2 SAT: UNDETERMINED %
PATIENT TEMPERATURE: 37
PCO2 ART: 19 mmHg — AB (ref 32.0–48.0)
PO2 ART: 76 mmHg — AB (ref 83.0–108.0)
pH, Arterial: 7.28 — ABNORMAL LOW (ref 7.350–7.450)

## 2017-09-28 LAB — CBC
HCT: 30.8 % — ABNORMAL LOW (ref 40.0–52.0)
HEMOGLOBIN: 10.5 g/dL — AB (ref 13.0–18.0)
MCH: 30.3 pg (ref 26.0–34.0)
MCHC: 34.2 g/dL (ref 32.0–36.0)
MCV: 88.5 fL (ref 80.0–100.0)
PLATELETS: 249 10*3/uL (ref 150–440)
RBC: 3.48 MIL/uL — ABNORMAL LOW (ref 4.40–5.90)
RDW: 13.3 % (ref 11.5–14.5)
WBC: 9.2 10*3/uL (ref 3.8–10.6)

## 2017-09-28 MED ORDER — INSULIN REGULAR HUMAN 100 UNIT/ML IJ SOLN
20.0000 [IU] | Freq: Once | INTRAMUSCULAR | Status: AC
  Administered 2017-09-29: 20 [IU] via INTRAVENOUS
  Filled 2017-09-28 (×2): qty 0.2

## 2017-09-28 MED ORDER — INSULIN ASPART 100 UNIT/ML ~~LOC~~ SOLN: 0.0000 [IU] | Freq: Three times a day (TID) | SUBCUTANEOUS | Status: DC

## 2017-09-28 MED ORDER — INSULIN ASPART 100 UNIT/ML ~~LOC~~ SOLN: 20.0000 [IU] | Freq: Once | SUBCUTANEOUS | Status: DC

## 2017-09-28 MED ORDER — INSULIN ASPART 100 UNIT/ML ~~LOC~~ SOLN: 0.0000 [IU] | Freq: Every day | SUBCUTANEOUS | Status: DC

## 2017-09-28 MED ORDER — INSULIN ASPART 100 UNIT/ML ~~LOC~~ SOLN
10.0000 [IU] | SUBCUTANEOUS | Status: AC
  Administered 2017-09-28: 10 [IU] via SUBCUTANEOUS
  Filled 2017-09-28: qty 1

## 2017-09-28 MED ORDER — INSULIN ASPART 100 UNIT/ML IV SOLN
20.0000 [IU] | Freq: Once | INTRAVENOUS | Status: AC
  Administered 2017-09-28: 20 [IU] via INTRAVENOUS
  Filled 2017-09-28: qty 0.2

## 2017-09-28 MED ORDER — INSULIN ASPART 100 UNIT/ML ~~LOC~~ SOLN
0.0000 [IU] | Freq: Three times a day (TID) | SUBCUTANEOUS | Status: DC
  Administered 2017-09-29: 3 [IU] via SUBCUTANEOUS
  Administered 2017-09-29 (×2): 5 [IU] via SUBCUTANEOUS
  Administered 2017-09-30: 8 [IU] via SUBCUTANEOUS
  Administered 2017-09-30: 5 [IU] via SUBCUTANEOUS
  Administered 2017-09-30: 11 [IU] via SUBCUTANEOUS
  Administered 2017-09-30: 2 [IU] via SUBCUTANEOUS
  Administered 2017-10-01: 11 [IU] via SUBCUTANEOUS
  Administered 2017-10-01: 8 [IU] via SUBCUTANEOUS
  Filled 2017-09-28 (×9): qty 1

## 2017-09-28 MED ORDER — INSULIN GLARGINE 100 UNIT/ML ~~LOC~~ SOLN
28.0000 [IU] | Freq: Once | SUBCUTANEOUS | Status: AC
  Administered 2017-09-28: 28 [IU] via SUBCUTANEOUS
  Filled 2017-09-28: qty 0.28

## 2017-09-28 NOTE — Progress Notes (Signed)
Long acting insulin did not come up from pharmacy today, so it was not given.  Patient was 516 then 575 on recheck.  Dr. Bridgett Larsson verbally ordered 20 of novoLOG for now.    Phillis Knack, RN

## 2017-09-28 NOTE — Progress Notes (Signed)
Pt FSBS remained elevated after given insulin. Primary nurse paged and spoke to Dr. Jannifer Franklin. Orders received for 20 units of regular insulin IV. Primary nurse to continue to monitor.

## 2017-09-28 NOTE — Progress Notes (Signed)
Physical Therapy Treatment Patient Details Name: Jordan [name] MRN: [id number] DOB: [birth year] Today's Date: 09/28/2017    History of Present Illness Jordan [name] is an 67 y.o. male. with a medical history of type 1 diabetes, previous CVA, atrial fibrillation, colon cancer status post resection, and hyperthyroidism who was admitted with acute renal failure, acute encephalopathy, and hyperkalemia.      PT Comments    Patient demonstrated progress today in performing a squat pivot transfer bed to recliner and with participation in [place] there ex.  Initially, the patient demonstrated confusion and was very inconsistent with responses to PT.  This appeared to improve throughout treatment and pt stated to PT that he lives with his sister who assists with mobility.  Pt required mod A for bed mobility during changing of brief and getting to EOB.  He was able to sit up at EOB but required VC's to avoid L lateral lean.  Pt attempted STS from bedside but was unable to manage sequencing in order to properly execute transfer.  Ambulation deferred for this reason and PT guided pt through a squat pivot transfer from bed to chair.  Pt required frequent VC's and manual cues, stopping mid-transfer 2-3 times and continuing following encouragement.  He was able to control descent to chair.  PT introduced seated there ex for pt to complete in chair and pt required VC's and manual assistance throughout.  PT assisted pt with positioning for comfort and elevated heels, instructing pt to contact nursing when he is ready to return to bed.  Pt will continue to benefit from skilled PT with focus on strength, safe functional mobility and balance and fall prevention.  Follow Up Recommendations  SNF     Equipment Recommendations       Recommendations for Other Services       Precautions / Restrictions Precautions Precautions: Fall Precaution Comments: Aphasia Restrictions Weight Bearing Restrictions: No Other  Position/Activity Restrictions: Nephrostomy bags bilat. R sided weakness    Mobility  Bed Mobility Overal bed mobility: Needs Assistance Bed Mobility: Rolling;Supine to Sit Rolling: Min assist   Supine to sit: Min assist     General bed mobility comments: Hand held assist to sit up and scoot to EOB, assistance needed to roll side to side when NA assisted pt in changing brief.  Transfers Overall transfer level: Needs assistance Equipment used: 1 person hand held assist Transfers: Squat Pivot Transfers     Squat pivot transfers: Mod assist     General transfer comment: Pt unsteady on feet when attempting STS.  Able to perform squat pivot to chair with heavy VC's for foot and hand placement and for redirection.  Pt stopped several times throughout transfer and PT redirected.  Ambulation/Gait Ambulation/Gait assistance: (Unable to attempt at this time.)               Stairs             Wheelchair Mobility    Modified Rankin (Stroke Patients Only)       Balance Overall balance assessment: Needs assistance Sitting-balance support: Feet supported;No upper extremity supported Sitting balance-Leahy Scale: Fair Sitting balance - Comments: Pt remained leaning to the L side following sitting until PT directed pt to sit upright.  He was then able to maintain upright posture. Postural control: Posterior lean   Standing balance-Leahy Scale: Poor Standing balance comment: Pt unable to attempt standing due to being unsteady on feet and AMS.  Cognition Arousal/Alertness: Awake/alert Behavior During Therapy: Flat affect Overall Cognitive Status: Impaired/Different from baseline Area of Impairment: Orientation;Attention;Following commands;Problem solving                 Orientation Level: Person Current Attention Level: Selective   Following Commands: Follows one step commands inconsistently     Problem Solving: Slow  processing;Decreased initiation;Requires verbal cues;Difficulty sequencing;Requires tactile cues General Comments: Focus appeared to improve as treatment progressed.  Pt stops mid-activity and requires redirection.      Exercises General Exercises - Upper Extremity Shoulder Horizontal ADduction: Strengthening;Both;10 reps(Towel squeeze) General Exercises - Lower Extremity Ankle Circles/Pumps: 20 reps;AAROM;Strengthening;Both;Seated(PT assisted in directing pt to move through full ROM for first 4-5 reps.  Decreased ROM of R ankle DF/PF noted.) Hip ABduction/ADduction: Strengthening;Both;10 reps;Seated Other Exercises Other Exercises: Assistance with bed mobility during changing of brief and draining of nephrostomy bags x 10 min.    General Comments        Pertinent Vitals/Pain Pain Assessment: No/denies pain    Home Living                      Prior Function            PT Goals (current goals can now be found in the care plan section) Acute Rehab PT Goals PT Goal Formulation: Patient unable to participate in goal setting    Frequency    Min 2X/week      PT Plan Current plan remains appropriate    Co-evaluation              AM-PAC PT "6 Clicks" Daily Activity  Outcome Measure  Difficulty turning over in bed (including adjusting bedclothes, sheets and blankets)?: Unable Difficulty moving from lying on back to sitting on the side of the bed? : A Lot Difficulty sitting down on and standing up from a chair with arms (e.g., wheelchair, bedside commode, etc,.)?: A Lot Help needed moving to and from a bed to chair (including a wheelchair)?: A Lot Help needed walking in hospital room?: Total Help needed climbing 3-5 steps with a railing? : Total 6 Click Score: 9    End of Session Equipment Utilized During Treatment: Gait belt Activity Tolerance: Patient limited by lethargy;Patient tolerated treatment well Patient left: with call bell/phone within reach;in  chair;with chair alarm set Nurse Communication: Mobility status PT Visit Diagnosis: Muscle weakness (generalized) (M62.81);Hemiplegia and hemiparesis Hemiplegia - Right/Left: Right Hemiplegia - caused by: Cerebral infarction     Time: 4967-5916 PT Time Calculation (min) (ACUTE ONLY): 33 min  Charges:  $Therapeutic Exercise: 8-22 mins $Therapeutic Activity: 8-22 mins                    Roxanne Gates, PT, DPT    Roxanne Gates 09/28/2017, 4:17 PM

## 2017-09-28 NOTE — Progress Notes (Signed)
[facility]  ROUNDING NOTE   Subjective:  Patient seen at bedside. Creatinine continues to improve. Patient in good spirits this a.m.   Objective:  Vital signs in last 24 hours:  Temp:  [97.6 F (36.4 C)-98.9 F (37.2 C)] 98.9 F (37.2 C) (08/24 0759) Pulse Rate:  [50-87] 87 (08/24 0759) Resp:  [11-18] 18 (08/24 0759) BP: (103-152)/(47-86) 152/86 (08/24 0759) SpO2:  [97 %-100 %] 97 % (08/24 0759) Weight:  [79.2 kg] 79.2 kg (08/23 1757)  Weight change:  Filed Weights   09/24/17 0610 09/25/17 0445 09/27/17 1757  Weight: 77 kg 74.5 kg 79.2 kg    Intake/Output: I/O last 3 completed shifts: In: 4175.3 [I.V.:2804.2; Other:1185; IV Piggyback:186.1] Out: 1850 [OBSJG:2836]   Intake/Output this shift:  No intake/output data recorded.  Physical Exam: General: No acute distress  Head: Normocephalic, atraumatic. Moist oral mucosal membranes  Eyes: Anicteric  Neck: Supple, trachea midline  Lungs:  Clear to auscultation, normal effort  Heart: S1S2 no rubs  Abdomen:  Soft, nontender, bowel sounds present  Extremities: No peripheral edema.  Neurologic: Awake, alert  Skin: No lesions       Basic Metabolic Panel: Recent Labs  Lab 09/22/17 0415 09/22/17 2112  09/24/17 2308 09/25/17 0054 09/25/17 1757 09/26/17 0502 09/27/17 0447 09/28/17 0410  NA 136 140   < > 151* 150* 151* 152* 147* 141  K 4.7 3.4*   < > 3.4* 3.5 3.3* 3.4* 3.3* 4.5  CL 107 98   < > 113* 113* 113* 115* 108 106  CO2 9* 18*   < > 24 26 28 27  32 27  GLUCOSE 223* 279*   < > 338* 348* 353* 211* 178* 172*  BUN 122* 94*   < > 37* 36* 30* 25* 25* 21  CREATININE 13.72* 9.18*   < > 3.39* 3.28* 2.64* 2.32* 2.25* 1.91*  CALCIUM 8.2* 8.1*   < > 9.1 9.0 9.1 8.9 8.2* 8.1*  MG 2.5* 2.2  --  1.8  --   --  1.9 1.8  --   PHOS 8.7* 6.7*  --  2.8  --   --  2.2* 2.5  --    < > = values in this interval not displayed.    Liver Function Tests: Recent Labs  Lab 09/21/17 1342 09/22/17 0415 09/23/17 0425  09/26/17 0502  AST 16 12* 12* 20  ALT 18 15 12 12   ALKPHOS 92 70 80 80  BILITOT 2.3* 1.7* 1.7* 1.0  PROT 7.5 6.1* 6.0* 6.7  ALBUMIN 3.2* 2.6* 2.3* 2.8*   No results for input(s): LIPASE, AMYLASE in the last 168 hours. No results for input(s): AMMONIA in the last 168 hours.  CBC: Recent Labs  Lab 09/21/17 1342 09/22/17 0415 09/23/17 0425 09/24/17 0448 09/26/17 0502 09/26/17 1915 09/27/17 0447 09/28/17 0309  WBC 32.3* 25.4* 13.4* 10.7* 7.0  --  11.6* 9.2  NEUTROABS 30.4* 23.0*  --   --   --   --   --   --   HGB 10.2* 9.5* 9.3* 9.9* 6.1* 11.7* 10.9* 10.5*  HCT 30.8* 27.8* 27.0* 28.1* 18.2* 34.5* 31.4* 30.8*  MCV 89.8 87.6 85.9 87.6 90.3  --  88.0 88.5  PLT 538* 412 363 322 194  --  269 249    Cardiac Enzymes: Recent Labs  Lab 09/21/17 1342  09/26/17 0942 09/26/17 1550 09/27/17 1004 09/27/17 1412 09/27/17 2019  CKTOTAL 129  --   --   --   --   --   --  TROPONINI <0.03   < > 0.04* 0.03* 0.03* <0.03 <0.03   < > = values in this interval not displayed.    BNP: Invalid input(s): POCBNP  CBG: Recent Labs  Lab 09/27/17 1040 09/27/17 1138 09/27/17 1705 09/27/17 2047 09/28/17 0801  GLUCAP 160* 168* 154* 249* 204*    Microbiology: Results for orders placed or performed during the hospital encounter of 09/21/17  Urine culture     Status: None   Collection Time: 09/21/17  2:31 PM  Result Value Ref Range Status   Specimen Description   Final    URINE, RANDOM Performed at [facility], [address] [place], [place] [address]    Special Requests   Final    NONE Performed at [facility], [address], [place], [place] [address]    Culture   Final    NO GROWTH Performed at [facility], [place] [address], [place], [place] [address]    Report Status 09/22/2017 FINAL  Final  Blood culture (routine x 2)     Status: None   Collection Time: 09/21/17  2:50 PM  Result Value Ref Range Status   Specimen Description BLOOD Blood Culture  adequate volume  Final   Special Requests   Final    BOTTLES DRAWN AEROBIC AND ANAEROBIC BLOOD RIGHT ARM   Culture   Final    NO GROWTH 5 DAYS Performed at [facility], [address] [place], [place] [address]    Report Status 09/26/2017 FINAL  Final  Blood culture (routine x 2)     Status: None   Collection Time: 09/21/17  2:50 PM  Result Value Ref Range Status   Specimen Description BLOOD BLOOD LEFT HAND  Final   Special Requests BOTTLES DRAWN AEROBIC AND ANAEROBIC  Final   Culture   Final    NO GROWTH 5 DAYS Performed at [facility], [address], [place], [place] [address]    Report Status 09/26/2017 FINAL  Final  MRSA PCR Screening     Status: None   Collection Time: 09/22/17  7:22 AM  Result Value Ref Range Status   MRSA by PCR NEGATIVE NEGATIVE Final    Comment:        The GeneXpert MRSA Assay (FDA approved for NASAL specimens only), is one component of a comprehensive MRSA colonization surveillance program. It is not intended to diagnose MRSA infection nor to guide or monitor treatment for MRSA infections. Performed at [facility], [address] [place],  [address]     Coagulation Studies: No results for input(s): LABPROT, INR in the last 72 hours.  Urinalysis: No results for input(s): COLORURINE, LABSPEC, PHURINE, GLUCOSEU, HGBUR, BILIRUBINUR, KETONESUR, PROTEINUR, UROBILINOGEN, NITRITE, LEUKOCYTESUR in the last 72 hours.  Invalid input(s): APPERANCEUR    Imaging: No results found.   Medications:   . diltiazem (CARDIZEM) infusion Stopped (09/26/17 1700)  . insulin (NOVOLIN-R) infusion Stopped (09/27/17 1139)   . amiodarone  400 mg Oral BID  . Chlorhexidine Gluconate Cloth  6 each Topical Q0600  . digoxin  0.0625 mg Oral Daily  . famotidine  20 mg Oral Daily  . folic acid  1 mg Oral Daily  . heparin injection (subcutaneous)  5,000 Units Subcutaneous Q8H  . insulin aspart  0-5 Units Subcutaneous QHS  .  insulin aspart  0-9 Units Subcutaneous TID WC  . insulin aspart  4 Units Subcutaneous TID WC  . insulin glargine  28 Units Subcutaneous Daily  .  multivitamin with minerals  1 tablet Oral Daily  . rosuvastatin  5 mg Oral q1800  . tamsulosin  0.4 mg Oral QPC supper   acetaminophen **OR** acetaminophen, haloperidol lactate  Assessment/ Plan:  67 y.o. male with medical problems of insulin-dependent diabetes, atrial fibrillation, stroke in 2014 with residual right-sided weakness and Aphasia, history of colon cancer with partial colectomy, Chronic bladder wall thickening who was admitted to [facility] on 09/21/2017 for evaluation of Lethargy and weaknbess.   Patient appears to have developed acute kidney injury from bilateral hydronephrosis and also underlying possible sepsis and dehydration.  Outpatient medications included benazepril.  1.  Acute renal failure.  Baseline creatinine 1.1 from April 2019 -Creatinine now down to 1.9.  Patient off of IV fluid hydration now.  Continue to periodically monitor renal parameters.  2.  Bilateral hydronephrosis and chronic bladder wall thickening.  Patient for cystoscopy next week.  3.  Severe acidosis Resolved now.  Patient off of bicarbonate drip.  4.    Hypokalemia.  Potassium normalized now and at 4.5.  Continue to monitor.  5.  Hypernatremia.  Now that patient is eating and drinking better serum sodium is normalized to 141.   LOS: 7 Wai Litt 8/24/20198:09 AM

## 2017-09-28 NOTE — Progress Notes (Signed)
[name] NAME: Jordan [name]    MR#:  [id number]  DATE OF BIRTH:  23-Dec-1950  SUBJECTIVE:   More awake off all drips now  Sugars up. Good UOP REVIEW OF SYSTEMS:   Review of Systems  Constitutional: Negative for chills, fever and weight loss.  HENT: Negative for ear discharge, ear pain and nosebleeds.   Eyes: Negative for blurred vision, pain and discharge.  Respiratory: Negative for sputum production, shortness of breath, wheezing and stridor.   Cardiovascular: Negative for chest pain, palpitations, orthopnea and PND.  Gastrointestinal: Negative for abdominal pain, diarrhea, nausea and vomiting.  Genitourinary: Negative for frequency and urgency.  Musculoskeletal: Negative for back pain and joint pain.  Neurological: Negative for sensory change, speech change, focal weakness and weakness.  Psychiatric/Behavioral: Negative for depression and hallucinations. The patient is not nervous/anxious.    DRUG ALLERGIES:   Allergies  Allergen Reactions  . Amiodarone Other (See Comments)    Thyrotoxicosis per Murray Hodgkins, NP    VITALS:  Blood pressure (!) 152/86, pulse 87, temperature 98.9 F (37.2 C), temperature source Oral, resp. rate 18, height 5\' 10"  (1.778 m), weight 79.2 kg, SpO2 97 %.  PHYSICAL EXAMINATION:   Physical Exam  GENERAL:  67 y.o.-year-old patient lying in the bed with no acute distress. Critically ill EYES: Pupils equal, round, reactive to light and accommodation. No scleral icterus. Extraocular muscles intact.  HEENT: Head atraumatic, normocephalic. Oropharynx and nasopharynx clear.  NECK:  Supple, no jugular venous distention. No thyroid enlargement, no tenderness.  LUNGS: Normal breath sounds bilaterally, no wheezing, rales, rhonchi. No use of accessory muscles of respiration.  CARDIOVASCULAR: S1, S2 normal. No murmurs, rubs, or gallops.  ABDOMEN: Soft, nontender, nondistended. Bowel sounds  present. No organomegaly or mass.  EXTREMITIES: No cyanosis, clubbing or edema b/l.    NEUROLOGIC: unable to assess due to mentation. Moves all extremities well   PSYCHIATRIC:  patient is lethargic and very sleepy SKIN: No obvious rash, lesion, or ulcer.   LABORATORY PANEL:  CBC Recent Labs  Lab 09/28/17 0309  WBC 9.2  HGB 10.5*  HCT 30.8*  PLT 249    Chemistries  Recent Labs  Lab 09/26/17 0502 09/27/17 0447 09/28/17 0410  NA 152* 147* 141  K 3.4* 3.3* 4.5  CL 115* 108 106  CO2 27 32 27  GLUCOSE 211* 178* 172*  BUN 25* 25* 21  CREATININE 2.32* 2.25* 1.91*  CALCIUM 8.9 8.2* 8.1*  MG 1.9 1.8  --   AST 20  --   --   ALT 12  --   --   ALKPHOS 80  --   --   BILITOT 1.0  --   --    Cardiac Enzymes Recent Labs  Lab 09/27/17 2019  TROPONINI <0.03   RADIOLOGY:  No results found. ASSESSMENT AND PLAN:  Jordan [name]  is a 67 y.o. male brought in by family secondary to altered mental status.  The patient has a history of stroke and has trouble speaking his words and has baseline right-sided weakness.  He states his appetite has been poor for the past week.  In the ER, his creatinine was found to be 14.94 with a potassium of 6.1.   1.  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  bladder scan showing only 80 cc in the bladder.  IV fluid hydration   Likely acute kidney injury from poor appetite and obstructive uropathy.  Hold nephrotoxic medications. -Patient irecieved urgent hemodialysis through right femoral HD Access--Now on hold -Seen by urology. Patient is s/p  bilateral nephrectomy tube secondary to hydronephrosis with possible obstructive mass in the bladder as opposed to hemorrhagic debris--will need cystoscopy with bladder mass biopsy per Dr Erlene Quan -creat 14.0--9.18--7.89--4.14--3.28--2.1 -UC no growth -d/c foley -Dr Erlene Quan will see next week and cystoscopy as out pt. Ok to remove foley  2.  Hyperkalemia.  Likely from acute  kidney injury. -  IV fluid hydration.  Monitor on telemetry.  pt got a dose of lokelma and IV calcium. -improved  3.  Type 1 diabetes mellitus.   -sugars going up.now off insulin gtt -cont lantus and ssi  4. Acute on chronic atrial fibrillation.  With his creatinine clearance being where it is the sotalol is contraindicated.    -continue his  cardizem, digoxin -now added amiodarone by Dr Vernard Gambles long term anticoagulation according to Dr khan--d/ced heparin gtt and monitor  5.  History of stroke with right-sided weakness.  Physical therapy consultation noted   6.  Essential hypertension on propranolol.  Hold benazepril.  7.  Hyperlipidemia unspecified.  On low-dose Crestor.  CPK normal range  Patient lives alone at home. He has significant medical problems. He came in very sick with acute renal failure, septic shock, a fib now has bilateral nephrectomy tubes which is new to him. Given his severity of illness comorbidities and not much help at home since he lives alone he will benefit from rehab. Will discuss with patient regarding home health rehab versus inpatient rehab. Care management and social worker for discharge planning.  Overall pt improving slowly  Case discussed with Care Management/Social Worker. Management plans discussed with the patient, family and they are in agreement.  CODE STATUS: full  DVT Prophylaxis: heparin  TOTAL TIME TAKING CARE OF THIS PATIENT: 30* minutes.  >50% time spent on counselling and coordination of care  POSSIBLE D/C IN *few* DAYS, DEPENDING ON CLINICAL CONDITION.  Note: This dictation was prepared with Dragon dictation along with smaller phrase technology. Any transcriptional errors that result from this process are unintentional.  Fritzi Mandes M.D on 09/28/2017 at 1:23 PM  Between 7am to 6pm - Pager - [phone]  After 6pm go to [email or web address] - password [email or web address] [facility]  Office  [phone]  CC: Primary  care physician; Albina Billet, MDPatient ID: Jordan [name], male   DOB: 1951/01/05, 67 y.o.   MRN: [id number]

## 2017-09-28 NOTE — Clinical Social Work Note (Signed)
Clinical Social Work Assessment  Patient Details  Name: Jordan [name] MRN: [id number] Date of Birth: [birth year]  Date of referral:  09/28/17               Reason for consult:  Facility Placement                Permission sought to share information with:  Chartered certified accountant granted to share information::  Yes, Verbal Permission Granted  Name::        Agency::  [facility]  Relationship::     Contact Information:     Housing/Transportation Living arrangements for the past 2 months:  Mobile Home Source of Information:  Patient, Medical Team Patient Interpreter Needed:  None Criminal Activity/Legal Involvement Pertinent to Current Situation/Hospitalization:  No - Comment as needed Significant Relationships:  Warehouse manager, Siblings Lives with:  Self Do you feel safe going back to the place where you live?  Yes Need for family participation in patient care:  No (Coment)  Care giving concerns:  PT/OT recommendations for SNF; Patient lives alone   Social Worker assessment / plan:  The CSW visited with the patient at bedside to discuss discharge planning. The patient provided verbal permission to begin the referral for SNF. The CSW explained the [facility] would need to provide prior authorization. The patient shared that he wants to go home, but is willing to consider SNF.  The CSW has sent the SNF referral and will follow up with bed offers when available. The patient will most likely discharge Monday or Tuesday. CSW will follow for discharge facilitation.  Employment status:  Retired Nurse, adult PT Recommendations:  [place] / Referral to community resources:  [place]  Patient/Family's Response to care:  The patient thanked the CSW.  Patient/Family's Understanding of and Emotional Response to Diagnosis, Current Treatment, and Prognosis:  The patient has limited insight into the  severity of his care needs and may need motivation to accept the discharge plan to SNF once bed offers are available.  Emotional Assessment Appearance:  Appears stated age Attitude/Demeanor/Rapport:  Engaged Affect (typically observed):  Appropriate, Pleasant Orientation:  Oriented to Self, Oriented to Place, Oriented to  Time, Oriented to Situation Alcohol / Substance use:  Never Used Psych involvement (Current and /or in the community):  No (Comment)  Discharge Needs  Concerns to be addressed:  Care Coordination, Discharge Planning Concerns Readmission within the last 30 days:  No Current discharge risk:  Chronically ill, Physical Impairment, Lives alone Barriers to Discharge:  Continued Medical Work up   [employer], LCSW 09/28/2017, 4:03 PM

## 2017-09-28 NOTE — Progress Notes (Signed)
RN went into the patient's room and found him sitting on the edge of the bed.  He was soaked in urine and one of the kidney tubes was disconnected.  He had been in the chair.  He was confused.  RN emptied urine bag his Right side, 400 then reattached tube.  CNA helped RN changed the bedding and situate the patient in the bed.  Patient was confused.   Phillis Knack, RN

## 2017-09-28 NOTE — Progress Notes (Signed)
Primary nurse received report from off going RN that pt FSBS was elevated at 575 and that she gave 20 units of insulin IV. Pt was recheck 40 minutes later. Pt FSBS was rechecked with a reading of 574. Primary nurse paged and spoke to Dr. Bridgett Larsson. Orders received for 28 units of Lantus, 10 units of Novolog subQ and BMP stat. Primary nurse notified Pharmacy and informed them to send Lantus STAT. Primary nurse to continue to monitor.

## 2017-09-28 NOTE — NC FL2 (Signed)
[place] LEVEL OF CARE SCREENING TOOL     IDENTIFICATION  Patient Name: Jordan [name] Birthdate: [birth year] Sex: male Admission Date (Current Location): 09/21/2017  [place] and [place] Number:  Engineering geologist and Address:  [facility], [address], [place], [place] [address]      Provider Number: [id number]  Attending Physician Name and Address:  Fritzi Mandes, MD  Relative Name and Phone Number:  Basilia Jumbo (Sister) [phone]    Current Level of Care: Hospital Recommended Level of Care: [place] Prior Approval Number:    Date Approved/Denied:   PASRR Number: [id number]  Discharge Plan: SNF    Current Diagnoses: Patient Active Problem List   Diagnosis Date Noted  . Acute kidney injury ([place]) 09/21/2017  . Hyperthyroidism 03/30/2014  . Type I diabetes mellitus ([place]) 03/30/2014  . Hyperglycemia 03/30/2014  . Thyrotoxicosis 03/30/2014  . History of stroke 03/30/2014  . Malnutrition of moderate degree ([place]) 03/28/2014  . Atrial fibrillation with rapid ventricular response ([place]) 03/19/2014    Orientation RESPIRATION BLADDER Height & Weight     Self, Time, Situation, Place  Normal Continent Weight: 174 lb 9.6 oz (79.2 kg) Height:  5\' 10"  (177.8 cm)  BEHAVIORAL SYMPTOMS/MOOD NEUROLOGICAL BOWEL NUTRITION STATUS      Continent Diet(Dysphagia 3)  AMBULATORY STATUS COMMUNICATION OF NEEDS Skin   Extensive Assist Verbally Normal                       Personal Care Assistance Level of Assistance  Bathing, Feeding, Dressing Bathing Assistance: Maximum assistance Feeding assistance: Limited assistance Dressing Assistance: Maximum assistance     Functional Limitations Info  Sight, Hearing, Speech Sight Info: Adequate Hearing Info: Adequate Speech Info: Adequate    SPECIAL CARE FACTORS FREQUENCY  PT (By licensed PT), OT (By licensed OT)     PT Frequency: Up to 5X per week OT Frequency: Up to  5X per week            Contractures Contractures Info: Not present    Additional Factors Info  Code Status, Allergies Code Status Info: Full Allergies Info: Amiodarone           Current Medications (09/28/2017):  This is the current hospital active medication list Current Facility-Administered Medications  Medication Dose Route Frequency Provider Last Rate Last Dose  . acetaminophen (TYLENOL) tablet 650 mg  650 mg Oral Q6H PRN Loletha Grayer, MD   650 mg at 09/25/17 1943   Or  . acetaminophen (TYLENOL) suppository 650 mg  650 mg Rectal Q6H PRN Loletha Grayer, MD      . amiodarone (PACERONE) tablet 400 mg  400 mg Oral BID Deboraha Sprang, MD   400 mg at 09/28/17 0941  . Chlorhexidine Gluconate Cloth 2 % PADS 6 each  6 each Topical Q0600 Murlean Iba, MD   6 each at 09/26/17 0606  . digoxin (LANOXIN) tablet 0.0625 mg  0.0625 mg Oral Daily Neoma Laming A, MD   0.0625 mg at 09/25/17 1949  . famotidine (PEPCID) tablet 20 mg  20 mg Oral Daily Pernell Dupre, RPH   20 mg at 09/28/17 0941  . folic acid (FOLVITE) tablet 1 mg  1 mg Oral Daily Wieting, Richard, MD   1 mg at 09/28/17 0941  . haloperidol lactate (HALDOL) injection 1 mg  1 mg Intravenous Q6H PRN Loletha Grayer, MD   1 mg at 09/25/17 1950  . heparin injection 5,000 Units  5,000 Units Subcutaneous Q8H Fritzi Mandes, MD   5,000 Units at 09/28/17 0719  . insulin aspart (novoLOG) injection 0-5 Units  0-5 Units Subcutaneous QHS Darel Hong D, NP   2 Units at 09/27/17 2200  . insulin aspart (novoLOG) injection 0-9 Units  0-9 Units Subcutaneous TID WC Bradly Bienenstock, NP   3 Units at 09/28/17 0825  . insulin aspart (novoLOG) injection 4 Units  4 Units Subcutaneous TID WC Darel Hong D, NP   4 Units at 09/28/17 [phone]  . insulin glargine (LANTUS) injection 28 Units  28 Units Subcutaneous Daily Bradly Bienenstock, NP   28 Units at 09/27/17 [phone]  . multivitamin with minerals tablet 1 tablet  1 tablet Oral Daily Loletha Grayer, MD   1 tablet at 09/28/17 0941  . rosuvastatin (CRESTOR) tablet 5 mg  5 mg Oral q1800 Loletha Grayer, MD   5 mg at 09/27/17 1714  . tamsulosin (FLOMAX) capsule 0.4 mg  0.4 mg Oral QPC supper Loletha Grayer, MD   0.4 mg at 09/27/17 1714     Discharge Medications: Please see discharge summary for a list of discharge medications.  Relevant Imaging Results:  Relevant Lab Results:   Additional Information SS# [id number]  Zettie Pho, LCSW

## 2017-09-28 NOTE — Progress Notes (Signed)
RN spoke with Dr. Ubaldo Glassing and Dr. Posey Pronto  BOTH MDs said  DO NOT HOLD AMIODARONE  It is not an allergy per say and another doctor made notes about that issue.  Phillis Knack, RN

## 2017-09-29 LAB — BASIC METABOLIC PANEL
ANION GAP: 9 (ref 5–15)
BUN: 29 mg/dL — ABNORMAL HIGH (ref 8–23)
CO2: 25 mmol/L (ref 22–32)
Calcium: 8.5 mg/dL — ABNORMAL LOW (ref 8.9–10.3)
Chloride: 104 mmol/L (ref 98–111)
Creatinine, Ser: 2.15 mg/dL — ABNORMAL HIGH (ref 0.61–1.24)
GFR, EST AFRICAN AMERICAN: 35 mL/min — AB (ref >60)
GFR, EST NON AFRICAN AMERICAN: 30 mL/min — AB (ref >60)
Glucose, Bld: 229 mg/dL — ABNORMAL HIGH (ref 70–99)
Potassium: 4.4 mmol/L (ref 3.5–5.1)
Sodium: 138 mmol/L (ref 135–145)

## 2017-09-29 LAB — GLUCOSE, CAPILLARY
GLUCOSE-CAPILLARY: 191 mg/dL — AB (ref 70–99)
GLUCOSE-CAPILLARY: 242 mg/dL — AB (ref 70–99)
GLUCOSE-CAPILLARY: 236 mg/dL — AB (ref 70–99)
GLUCOSE-CAPILLARY: 210 mg/dL — AB (ref 70–99)
GLUCOSE-CAPILLARY: 166 mg/dL — AB (ref 70–99)
Glucose-Capillary: 205 mg/dL — ABNORMAL HIGH (ref 70–99)
Glucose-Capillary: 398 mg/dL — ABNORMAL HIGH (ref 70–99)

## 2017-09-29 MED ORDER — INSULIN ASPART 100 UNIT/ML ~~LOC~~ SOLN
8.0000 [IU] | Freq: Three times a day (TID) | SUBCUTANEOUS | Status: DC
  Administered 2017-09-29 – 2017-09-30 (×4): 8 [IU] via SUBCUTANEOUS
  Filled 2017-09-29 (×4): qty 1

## 2017-09-29 NOTE — Progress Notes (Signed)
[place] [facility] at [place] NAME: Jordan [name]    MR#:  [id number]  DATE OF BIRTH:  April 03, 1950  SUBJECTIVE:  Pt seems to be confused intermittently. Not sure if he understands about overall medical problems  Sugars up. Good UOP REVIEW OF SYSTEMS:   Review of Systems  Constitutional: Negative for chills, fever and weight loss.  HENT: Negative for ear discharge, ear pain and nosebleeds.   Eyes: Negative for blurred vision, pain and discharge.  Respiratory: Negative for sputum production, shortness of breath, wheezing and stridor.   Cardiovascular: Negative for chest pain, palpitations, orthopnea and PND.  Gastrointestinal: Negative for abdominal pain, diarrhea, nausea and vomiting.  Genitourinary: Negative for frequency and urgency.  Musculoskeletal: Negative for back pain and joint pain.  Neurological: Negative for sensory change, speech change, focal weakness and weakness.  Psychiatric/Behavioral: Negative for depression and hallucinations. The patient is not nervous/anxious.    DRUG ALLERGIES:   Allergies  Allergen Reactions  . Amiodarone Other (See Comments)    Thyrotoxicosis per Murray Hodgkins, NP    VITALS:  Blood pressure (!) 147/65, pulse 81, temperature 98.5 F (36.9 C), temperature source Oral, resp. rate 18, height 5\' 10"  (1.778 m), weight 79.2 kg, SpO2 99 %.  PHYSICAL EXAMINATION:   Physical Exam  GENERAL:  67 y.o.-year-old patient lying in the bed with no acute distress. Critically ill EYES: Pupils equal, round, reactive to light and accommodation. No scleral icterus. Extraocular muscles intact.  HEENT: Head atraumatic, normocephalic. Oropharynx and nasopharynx clear.  NECK:  Supple, no jugular venous distention. No thyroid enlargement, no tenderness.  LUNGS: Normal breath sounds bilaterally, no wheezing, rales, rhonchi. No use of accessory muscles of respiration.  CARDIOVASCULAR: S1, S2 normal. No murmurs, rubs, or  gallops.  ABDOMEN: Soft, nontender, nondistended. Bowel sounds present. No organomegaly or mass. Bilateral nephrostomy tubes+ EXTREMITIES: No cyanosis, clubbing or edema b/l.    NEUROLOGIC: unable to assess due to mentation. Moves all extremities well   PSYCHIATRIC:  patient is lethargic and very sleepy SKIN: No obvious rash, lesion, or ulcer.   LABORATORY PANEL:  CBC Recent Labs  Lab 09/28/17 0309  WBC 9.2  HGB 10.5*  HCT 30.8*  PLT 249    Chemistries  Recent Labs  Lab 09/26/17 0502 09/27/17 0447  09/29/17 1059  NA 152* 147*   < > 138  K 3.4* 3.3*   < > 4.4  CL 115* 108   < > 104  CO2 27 32   < > 25  GLUCOSE 211* 178*   < > 229*  BUN 25* 25*   < > 29*  CREATININE 2.32* 2.25*   < > 2.15*  CALCIUM 8.9 8.2*   < > 8.5*  MG 1.9 1.8  --   --   AST 20  --   --   --   ALT 12  --   --   --   ALKPHOS 80  --   --   --   BILITOT 1.0  --   --   --    < > = values in this interval not displayed.   Cardiac Enzymes Recent Labs  Lab 09/27/17 2019  TROPONINI <0.03   RADIOLOGY:  No results found. ASSESSMENT AND PLAN:  Baraa Tubbs  is a 67 y.o. male brought in by family secondary to altered mental status.  The patient has a history of stroke and has trouble speaking his words and has baseline right-sided weakness.  He states his appetite has been poor for the past week.  In the ER, his creatinine was found to be 14.94 with a potassium of 6.1.   1.  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   -  Likely acute kidney injury from poor appetite and obstructive uropathy.  Hold nephrotoxic medications. -Patient irecieved urgent hemodialysis through right femoral HD Access--Now discontinued -Seen by urology. Patient is s/p  bilateral nephrostomy tubes secondary to hydronephrosis with possible obstructive mass in the bladder as opposed to hemorrhagic debris--will need cystoscopy with bladder mass biopsy per Dr Erlene Quan at later date -creat  14.0--9.18--7.89--4.14--3.28--2.1 -UC no growth -d/c foley -Dr Erlene Quan will see next week and cystoscopy as out pt. Ok to remove foley  2.  Hyperkalemia.  Likely from acute kidney injury. - recieved IV fluid hydration.    pt got a dose of lokelma and IV calcium. -improved  3.  Type 1 diabetes mellitus.   -sugars going up.now off insulin gtt -cont lantus and ssi  4. Acute on chronic atrial fibrillation.  With his creatinine clearance being where it is the sotalol is contraindicated.    -continue his  cardizem, digoxin and amiodarone -now added amiodarone by Dr Caryl Comes- -no  long term anticoagulation according to Dr khan--d/ced heparin gtt  -pt will follow dr Ubaldo Glassing as out pt---his primary cardiologist  5.  History of stroke with right-sided weakness.  Physical therapy consultation noted   6.  Essential hypertension on propranolol.  Hold benazepril.  7.  Hyperlipidemia unspecified.  On low-dose Crestor.  CPK normal range  Patient lives alone at home. He has significant medical problems. He came in very sick with acute renal failure, septic shock, a fib now has bilateral nephrectomy tubes which is new to him. Given his severity of illness comorbidities and not much help at home since he lives alone he will benefit from rehab. Will discuss with patient regarding home health rehab versus inpatient rehab. Care management and social worker for discharge planning.  Overall pt improving slowly  Case discussed with Care Management/Social Worker. Management plans discussed with the patient, family and they are in agreement.  CODE STATUS: full  DVT Prophylaxis: heparin  TOTAL TIME TAKING CARE OF THIS PATIENT: 30* minutes.  >50% time spent on counselling and coordination of care  POSSIBLE D/C IN *few* DAYS, DEPENDING ON CLINICAL CONDITION.  Note: This dictation was prepared with Dragon dictation along with smaller phrase technology. Any transcriptional errors that result from this process  are unintentional.  Fritzi Mandes M.D on 09/29/2017 at 12:54 PM  Between 7am to 6pm - Pager - [phone]  After 6pm go to [email or web address] - password [email or web address] [facility]  Office  [phone]  CC: Primary care physician; Albina Billet, MDPatient ID: Jordan [name], male   DOB: January 23, 1951, 67 y.o.   MRN: [id number]

## 2017-09-29 NOTE — Progress Notes (Signed)
[facility]  ROUNDING NOTE   Subjective:  Good urine output at 1.9 L over the preceding 24 hours. However creatinine appears to have plateaued at 2.3. Patient resting comfortably.   Objective:  Vital signs in last 24 hours:  Temp:  [98.2 F (36.8 C)-98.5 F (36.9 C)] 98.5 F (36.9 C) (08/25 0733) Pulse Rate:  [81-97] 81 (08/25 0733) Resp:  [18-20] 18 (08/25 0304) BP: (147-157)/(65-78) 147/65 (08/25 0733) SpO2:  [97 %-99 %] 99 % (08/25 0733)  Weight change:  Filed Weights   09/24/17 0610 09/25/17 0445 09/27/17 1757  Weight: 77 kg 74.5 kg 79.2 kg    Intake/Output: I/O last 3 completed shifts: In: 4050 [Other:4050] Out: 1975 [ZOXWR:6045]   Intake/Output this shift:  No intake/output data recorded.  Physical Exam: General: No acute distress  Head: Normocephalic, atraumatic. Moist oral mucosal membranes  Eyes: Anicteric  Neck: Supple, trachea midline  Lungs:  Clear to auscultation, normal effort  Heart: S1S2 no rubs  Abdomen:  Soft, nontender, bowel sounds present  Extremities: No peripheral edema.  Neurologic: Resting comfortably  Skin: No lesions       Basic Metabolic Panel: Recent Labs  Lab 09/22/17 2112  09/24/17 2308  09/25/17 1757 09/26/17 0502 09/27/17 0447 09/28/17 0410 09/28/17 2030  NA 140   < > 151*   < > 151* 152* 147* 141 132*  K 3.4*   < > 3.4*   < > 3.3* 3.4* 3.3* 4.5 4.9  CL 98   < > 113*   < > 113* 115* 108 106 102  CO2 18*   < > 24   < > 28 27 32 27 23  GLUCOSE 279*   < > 338*   < > 353* 211* 178* 172* 565*  BUN 94*   < > 37*   < > 30* 25* 25* 21 31*  CREATININE 9.18*   < > 3.39*   < > 2.64* 2.32* 2.25* 1.91* 2.32*  CALCIUM 8.1*   < > 9.1   < > 9.1 8.9 8.2* 8.1* 8.1*  MG 2.2  --  1.8  --   --  1.9 1.8  --   --   PHOS 6.7*  --  2.8  --   --  2.2* 2.5  --   --    < > = values in this interval not displayed.    Liver Function Tests: Recent Labs  Lab 09/23/17 0425 09/26/17 0502  AST 12* 20  ALT 12 12  ALKPHOS 80 80   BILITOT 1.7* 1.0  PROT 6.0* 6.7  ALBUMIN 2.3* 2.8*   No results for input(s): LIPASE, AMYLASE in the last 168 hours. No results for input(s): AMMONIA in the last 168 hours.  CBC: Recent Labs  Lab 09/23/17 0425 09/24/17 0448 09/26/17 0502 09/26/17 1915 09/27/17 0447 09/28/17 0309  WBC 13.4* 10.7* 7.0  --  11.6* 9.2  HGB 9.3* 9.9* 6.1* 11.7* 10.9* 10.5*  HCT 27.0* 28.1* 18.2* 34.5* 31.4* 30.8*  MCV 85.9 87.6 90.3  --  88.0 88.5  PLT 363 322 194  --  269 249    Cardiac Enzymes: Recent Labs  Lab 09/26/17 0942 09/26/17 1550 09/27/17 1004 09/27/17 1412 09/27/17 2019  TROPONINI 0.04* 0.03* 0.03* <0.03 <0.03    BNP: Invalid input(s): POCBNP  CBG: Recent Labs  Lab 09/28/17 2241 09/29/17 0004 09/29/17 0301 09/29/17 0438 09/29/17 0730  GLUCAP 425* 398* 191* 205* 242*    Microbiology: Results for orders placed or performed during  the hospital encounter of 09/21/17  Urine culture     Status: None   Collection Time: 09/21/17  2:31 PM  Result Value Ref Range Status   Specimen Description   Final    URINE, RANDOM Performed at [facility], [address], [place], [place] [address]    Special Requests   Final    NONE Performed at [facility], [address] [place], [place] [address]    Culture   Final    NO GROWTH Performed at [facility], [place] [address] [place], [place] [address]    Report Status 09/22/2017 FINAL  Final  Blood culture (routine x 2)     Status: None   Collection Time: 09/21/17  2:50 PM  Result Value Ref Range Status   Specimen Description BLOOD Blood Culture adequate volume  Final   Special Requests   Final    BOTTLES DRAWN AEROBIC AND ANAEROBIC BLOOD RIGHT ARM   Culture   Final    NO GROWTH 5 DAYS Performed at [facility], [address] [place], [place] [address]    Report Status 09/26/2017 FINAL  Final  Blood culture (routine x 2)     Status: None   Collection Time: 09/21/17  2:50 PM   Result Value Ref Range Status   Specimen Description BLOOD BLOOD LEFT HAND  Final   Special Requests BOTTLES DRAWN AEROBIC AND ANAEROBIC  Final   Culture   Final    NO GROWTH 5 DAYS Performed at [facility], [address], [place], [place] [address]    Report Status 09/26/2017 FINAL  Final  MRSA PCR Screening     Status: None   Collection Time: 09/22/17  7:22 AM  Result Value Ref Range Status   MRSA by PCR NEGATIVE NEGATIVE Final    Comment:        The GeneXpert MRSA Assay (FDA approved for NASAL specimens only), is one component of a comprehensive MRSA colonization surveillance program. It is not intended to diagnose MRSA infection nor to guide or monitor treatment for MRSA infections. Performed at [facility], [address] [place], [place] [address]     Coagulation Studies: No results for input(s): LABPROT, INR in the last 72 hours.  Urinalysis: No results for input(s): COLORURINE, LABSPEC, PHURINE, GLUCOSEU, HGBUR, BILIRUBINUR, KETONESUR, PROTEINUR, UROBILINOGEN, NITRITE, LEUKOCYTESUR in the last 72 hours.  Invalid input(s): APPERANCEUR    Imaging: No results found.   Medications:    . amiodarone  400 mg Oral BID  . Chlorhexidine Gluconate Cloth  6 each Topical Q0600  . digoxin  0.0625 mg Oral Daily  . famotidine  20 mg Oral Daily  . folic acid  1 mg Oral Daily  . heparin injection (subcutaneous)  5,000 Units Subcutaneous Q8H  . insulin aspart  0-15 Units Subcutaneous TID AC & HS  . insulin aspart  8 Units Subcutaneous TID WC  . insulin glargine  28 Units Subcutaneous Daily  . multivitamin with minerals  1 tablet Oral Daily  . rosuvastatin  5 mg Oral q1800  . tamsulosin  0.4 mg Oral QPC supper   acetaminophen **OR** acetaminophen, haloperidol lactate  Assessment/ Plan:  67 y.o. 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   Patient appears to  have developed acute kidney injury from bilateral hydronephrosis and also underlying possible sepsis and dehydration.  Outpatient medications included benazepril.  1.  Acute renal failure.  Baseline creatinine 1.1 from April 2019 -Creatinine up to 2.3.  Was 1.9.  Unclear as to what led to the rise.  However good urine output of 1.9 L noted.  Continue to monitor renal function trend for now.  2.  Bilateral hydronephrosis and chronic bladder wall thickening.  Patient for cystoscopy next week.  3.  Severe acidosis Serum bicarbonate acceptable at 23.  Patient was treated with bicarbonate drip earlier in the week.  4.    Hypokalemia.  Potassium at target at 4.9.  5.  Hyponatremia.  Earlier in the week the patient had significant hypernatremia however now serum sodium low at 132.  Recheck this later today to make sure this is not inaccurate.  Suspect hyperglycemia playing a role as well.    LOS: 8 Emmalin Jaquess 8/25/20199:03 AM

## 2017-09-29 NOTE — Progress Notes (Signed)
SUBJECTIVE: patient is feeling much better and remains in sinus rhythm   Vitals:   09/28/17 1606 09/28/17 1943 09/29/17 0304 09/29/17 0733  BP: (!) 157/72 (!) 148/73 (!) 153/78 (!) 147/65  Pulse: 89 94 97 81  Resp: 20 18 18    Temp: 97.0 F (26.3 C) 98.3 F (36.8 C) 98.2 F (36.8 C) 98.5 F (36.9 C)  TempSrc: Oral Oral  Oral  SpO2: 97% 98% 99% 99%  Weight:      Height:        Intake/Output Summary (Last 24 hours) at 09/29/2017 0924 Last data filed at 09/29/2017 0555 Gross per 24 hour  Intake 3050 ml  Output 1975 ml  Net 1075 ml    LABS: Basic Metabolic Panel: Recent Labs    09/27/17 0447 09/28/17 0410 09/28/17 2030  NA 147* 141 132*  K 3.3* 4.5 4.9  CL 108 106 102  CO2 32 27 23  GLUCOSE 178* 172* 565*  BUN 25* 21 31*  CREATININE 2.25* 1.91* 2.32*  CALCIUM 8.2* 8.1* 8.1*  MG 1.8  --   --   PHOS 2.5  --   --    Liver Function Tests: No results for input(s): AST, ALT, ALKPHOS, BILITOT, PROT, ALBUMIN in the last 72 hours. No results for input(s): LIPASE, AMYLASE in the last 72 hours. CBC: Recent Labs    09/27/17 0447 09/28/17 0309  WBC 11.6* 9.2  HGB 10.9* 10.5*  HCT 31.4* 30.8*  MCV 88.0 88.5  PLT 269 249   Cardiac Enzymes: Recent Labs    09/27/17 1004 09/27/17 1412 09/27/17 2019  TROPONINI 0.03* <0.03 <0.03   BNP: Invalid input(s): POCBNP D-Dimer: No results for input(s): DDIMER in the last 72 hours. Hemoglobin A1C: No results for input(s): HGBA1C in the last 72 hours. Fasting Lipid Panel: No results for input(s): CHOL, HDL, LDLCALC, TRIG, CHOLHDL, LDLDIRECT in the last 72 hours. Thyroid Function Tests: No results for input(s): TSH, T4TOTAL, T3FREE, THYROIDAB in the last 72 hours.  Invalid input(s): FREET3 Anemia Panel: No results for input(s): VITAMINB12, FOLATE, FERRITIN, TIBC, IRON, RETICCTPCT in the last 72 hours.   PHYSICAL EXAM General: Well developed, well nourished, in no acute distress HEENT:  Normocephalic and atramatic Neck:   No JVD.  Lungs: Clear bilaterally to auscultation and percussion. Heart: HRRR . Normal S1 and S2 without gallops or murmurs.  Abdomen: Bowel sounds are positive, abdomen soft and non-tender  Msk:  Back normal, normal gait. Normal strength and tone for age. Extremities: No clubbing, cyanosis or edema.   Neuro: Alert and oriented X 3. Psych:  Good affect, responds appropriately  TELEMETRY: sinus rhythm about 1 16 bpm  ASSESSMENT AND PLAN: status post rapid atrial ventricular response rate and atrial fibrillation currently in sinus tachycardia continue amiodarone 400 by mouth twice a day and eventually upon discharge may go home on 400 once a day. With follow-up in the office in one week.  Active Problems:   Acute kidney injury (HCC)    Dionisio David, MD, [facility] 09/29/2017 9:24 AM

## 2017-09-30 LAB — GLUCOSE, CAPILLARY
GLUCOSE-CAPILLARY: 243 mg/dL — AB (ref 70–99)
GLUCOSE-CAPILLARY: 312 mg/dL — AB (ref 70–99)
Glucose-Capillary: 283 mg/dL — ABNORMAL HIGH (ref 70–99)
Glucose-Capillary: 146 mg/dL — ABNORMAL HIGH (ref 70–99)

## 2017-09-30 MED ORDER — LORATADINE 10 MG PO TABS
10.0000 mg | ORAL_TABLET | Freq: Every day | ORAL | Status: DC
  Administered 2017-09-30 – 2017-10-01 (×2): 10 mg via ORAL
  Filled 2017-09-30 (×2): qty 1

## 2017-09-30 MED ORDER — INSULIN ASPART 100 UNIT/ML ~~LOC~~ SOLN
10.0000 [IU] | Freq: Three times a day (TID) | SUBCUTANEOUS | Status: DC
  Administered 2017-09-30 – 2017-10-01 (×4): 10 [IU] via SUBCUTANEOUS
  Filled 2017-09-30 (×4): qty 1

## 2017-09-30 MED ORDER — METOPROLOL TARTRATE 25 MG PO TABS
25.0000 mg | ORAL_TABLET | Freq: Two times a day (BID) | ORAL | Status: DC
  Administered 2017-09-30 – 2017-10-01 (×3): 25 mg via ORAL
  Filled 2017-09-30 (×3): qty 1

## 2017-09-30 MED ORDER — VITAMIN C 500 MG PO TABS
250.0000 mg | ORAL_TABLET | Freq: Two times a day (BID) | ORAL | Status: DC
  Administered 2017-09-30 – 2017-10-01 (×2): 250 mg via ORAL
  Filled 2017-09-30 (×3): qty 0.5

## 2017-09-30 MED ORDER — ERYTHROMYCIN 5 MG/GM OP OINT
TOPICAL_OINTMENT | Freq: Four times a day (QID) | OPHTHALMIC | Status: DC
  Administered 2017-09-30 – 2017-10-01 (×5): 1 via OPHTHALMIC
  Filled 2017-09-30: qty 1

## 2017-09-30 MED ORDER — INSULIN GLARGINE 100 UNIT/ML ~~LOC~~ SOLN
33.0000 [IU] | Freq: Every day | SUBCUTANEOUS | Status: DC
  Administered 2017-10-01: 33 [IU] via SUBCUTANEOUS
  Filled 2017-09-30 (×2): qty 0.33

## 2017-09-30 MED ORDER — PREMIER PROTEIN SHAKE
11.0000 [oz_av] | Freq: Two times a day (BID) | ORAL | Status: DC
  Administered 2017-09-30 – 2017-10-01 (×2): 11 [oz_av] via ORAL

## 2017-09-30 NOTE — Clinical Social Work Note (Signed)
CSW gave bed offers to patient. He chose [facility]. CSW notified Otila Kluver at [facility] that patient has accepted bed offer. Otila Kluver will begin [facility] authorization. CSW will continue to follow for discharge planning.   [place], [place]

## 2017-09-30 NOTE — Progress Notes (Signed)
[facility]  ROUNDING NOTE   Subjective:   Some confusion this morning.   Creatinine 2.15 (2.32)  UOP: right urostomy 1637mL. Left 43mL Total 201mL   Objective:  Vital signs in last 24 hours:  Temp:  [98.3 F (36.8 C)-98.8 F (37.1 C)] 98.4 F (36.9 C) (08/26 0416) Pulse Rate:  [95-106] 106 (08/26 0743) Resp:  [16-17] 16 (08/26 0416) BP: (136-163)/(83-93) 136/93 (08/26 0743) SpO2:  [97 %-100 %] 99 % (08/26 0743) Weight:  [75.8 kg] 75.8 kg (08/26 0416)  Weight change:  Filed Weights   09/25/17 0445 09/27/17 1757 09/30/17 0416  Weight: 74.5 kg 79.2 kg 75.8 kg    Intake/Output: I/O last 3 completed shifts: In: -  Out: 4050 [Urine:4050]   Intake/Output this shift:  Total I/O In: 990 [P.O.:240; Other:750] Out: -   Physical Exam: General: No acute distress  Head: Normocephalic, atraumatic. Moist oral mucosal membranes  Eyes: Anicteric  Neck: Supple, trachea midline  Lungs:  Clear to auscultation, normal effort  Heart: regular  Abdomen:  Soft, nontender, bowel sounds present  Extremities: No peripheral edema.  Neurologic: nonfocal  Skin: No lesions  GU:  Bilateral urostomy tubes with clear yellow urine    Basic Metabolic Panel: Recent Labs  Lab 09/24/17 2308  09/26/17 0502 09/27/17 0447 09/28/17 0410 09/28/17 2030 09/29/17 1059  NA 151*   < > 152* 147* 141 132* 138  K 3.4*   < > 3.4* 3.3* 4.5 4.9 4.4  CL 113*   < > 115* 108 106 102 104  CO2 24   < > 27 32 27 23 25   GLUCOSE 338*   < > 211* 178* 172* 565* 229*  BUN 37*   < > 25* 25* 21 31* 29*  CREATININE 3.39*   < > 2.32* 2.25* 1.91* 2.32* 2.15*  CALCIUM 9.1   < > 8.9 8.2* 8.1* 8.1* 8.5*  MG 1.8  --  1.9 1.8  --   --   --   PHOS 2.8  --  2.2* 2.5  --   --   --    < > = values in this interval not displayed.    Liver Function Tests: Recent Labs  Lab 09/26/17 0502  AST 20  ALT 12  ALKPHOS 80  BILITOT 1.0  PROT 6.7  ALBUMIN 2.8*   No results for input(s): LIPASE, AMYLASE in  the last 168 hours. No results for input(s): AMMONIA in the last 168 hours.  CBC: Recent Labs  Lab 09/24/17 0448 09/26/17 0502 09/26/17 1915 09/27/17 0447 09/28/17 0309  WBC 10.7* 7.0  --  11.6* 9.2  HGB 9.9* 6.1* 11.7* 10.9* 10.5*  HCT 28.1* 18.2* 34.5* 31.4* 30.8*  MCV 87.6 90.3  --  88.0 88.5  PLT 322 194  --  269 249    Cardiac Enzymes: Recent Labs  Lab 09/26/17 0942 09/26/17 1550 09/27/17 1004 09/27/17 1412 09/27/17 2019  TROPONINI 0.04* 0.03* 0.03* <0.03 <0.03    BNP: Invalid input(s): POCBNP  CBG: Recent Labs  Lab 09/29/17 1141 09/29/17 1618 09/29/17 2116 09/30/17 0755 09/30/17 1152  GLUCAP 236* 210* 166* 2* 146*    Microbiology: Results for orders placed or performed during the hospital encounter of 09/21/17  Urine culture     Status: None   Collection Time: 09/21/17  2:31 PM  Result Value Ref Range Status   Specimen Description   Final    URINE, RANDOM Performed at [facility], [address], [place], [place] [address]  Special Requests   Final    NONE Performed at [facility], [address] [place], [place] [address]    Culture   Final    NO GROWTH Performed at [facility], [address], [place], [place] [address]    Report Status 09/22/2017 FINAL  Final  Blood culture (routine x 2)     Status: None   Collection Time: 09/21/17  2:50 PM  Result Value Ref Range Status   Specimen Description BLOOD Blood Culture adequate volume  Final   Special Requests   Final    BOTTLES DRAWN AEROBIC AND ANAEROBIC BLOOD RIGHT ARM   Culture   Final    NO GROWTH 5 DAYS Performed at [facility], [address] [place], [place] [address]    Report Status 09/26/2017 FINAL  Final  Blood culture (routine x 2)     Status: None   Collection Time: 09/21/17  2:50 PM  Result Value Ref Range Status   Specimen Description BLOOD BLOOD LEFT HAND  Final   Special Requests BOTTLES DRAWN AEROBIC AND ANAEROBIC  Final    Culture   Final    NO GROWTH 5 DAYS Performed at [facility], [address], [place], [place] [address]    Report Status 09/26/2017 FINAL  Final  MRSA PCR Screening     Status: None   Collection Time: 09/22/17  7:22 AM  Result Value Ref Range Status   MRSA by PCR NEGATIVE NEGATIVE Final    Comment:        The GeneXpert MRSA Assay (FDA approved for NASAL specimens only), is one component of a comprehensive MRSA colonization surveillance program. It is not intended to diagnose MRSA infection nor to guide or monitor treatment for MRSA infections. Performed at [facility], [address] [place], [place] [address]     Coagulation Studies: No results for input(s): LABPROT, INR in the last 72 hours.  Urinalysis: No results for input(s): COLORURINE, LABSPEC, PHURINE, GLUCOSEU, HGBUR, BILIRUBINUR, KETONESUR, PROTEINUR, UROBILINOGEN, NITRITE, LEUKOCYTESUR in the last 72 hours.  Invalid input(s): APPERANCEUR    Imaging: No results found.   Medications:    . amiodarone  400 mg Oral BID  . Chlorhexidine Gluconate Cloth  6 each Topical Q0600  . digoxin  0.0625 mg Oral Daily  . famotidine  20 mg Oral Daily  . folic acid  1 mg Oral Daily  . heparin injection (subcutaneous)  5,000 Units Subcutaneous Q8H  . insulin aspart  0-15 Units Subcutaneous TID AC & HS  . insulin aspart  10 Units Subcutaneous TID WC  . [START ON 10/01/2017] insulin glargine  33 Units Subcutaneous Daily  . metoprolol tartrate  25 mg Oral BID  . multivitamin with minerals  1 tablet Oral Daily  . rosuvastatin  5 mg Oral q1800  . tamsulosin  0.4 mg Oral QPC supper   acetaminophen **OR** acetaminophen  Assessment/ Plan:  67 y.o. male  Mr. Waleed Dettman Chap is a 67 y.o. white male with diabetes mellitus type II insulin dependent, atrial fibrillation, CVA in 2014 with residual right-sided weakness and Aphasia, history of colon cancer with partial colectomy  1.  Acute renal failure with  metabolic acidosis and obstructive uropathy: requiring hemodialysis during this admission Baseline creatinine of 1.1, GFR >60 on 05/29/17 Acute renal failure secondary to obstructive uropathy. Status post urostomy tubes bilaterally on 8/18 by Dr. Anselm Pancoast, radiology.  Nonoliguric urine output Creatinine trending downward.  - Appreciate urology input.  2.  Hypernatremia: free water deficit. Now improved to 138  3. Hypertension: 211/17 - diastolic elevated.  - metoprolol   LOS: 9 Tienna Bienkowski 8/26/201912:06 PM

## 2017-09-30 NOTE — Progress Notes (Addendum)
Inpatient Diabetes Program Recommendations  AACE/ADA: New Consensus Statement on Inpatient Glycemic Control (2015)  Target Ranges:  Prepandial:   less than 140 mg/dL      Peak postprandial:   less than 180 mg/dL (1-2 hours)      Critically ill patients:  140 - 180 mg/dL   Results for Jordan [name], [name] (MRN [id number]) as of 09/30/2017 08:07  Ref. Range 09/28/2017 08:01 09/28/2017 12:01 09/28/2017 18:04 09/28/2017 18:46 09/28/2017 20:00 09/28/2017 20:05 09/28/2017 21:38 09/28/2017 22:41  Glucose-Capillary Latest Ref Range: 70 - 99 mg/dL 204 (H)  7 units NOVOLOG  168 (H)  0 units NOVOLOG  514 (HH) 575 (HH)  20 units NOVOLOG  584 (HH)  10 units NOVOLOG  574 (HH)    28 units LANTUS 459 (H) 425 (H)  20 units REGULAR at MN   Results for [name], Jordan [name] (MRN [id number]) as of 09/30/2017 08:07  Ref. Range 09/29/2017 07:30 09/29/2017 11:41 09/29/2017 16:18 09/29/2017 21:16  Glucose-Capillary Latest Ref Range: 70 - 99 mg/dL 242 (H)  8 units NOVOLOG  236 (H)  13 units NOVOLOG +  28 units LANTUS  210 (H)  13 units NOVOLOG  166 (H)  3 units NOVOLOG    Results for [name], Jordan [name] (MRN [id number]) as of 09/30/2017 08:07  Ref. Range 09/30/2017 07:55  Glucose-Capillary Latest Ref Range: 70 - 99 mg/dL 283 (H)    Home DM Meds:Lantus 28 units daily Humalog 10 units Breakfast/ 8 units Lunch/ 12 units Dinner Humalog 4 units with Snacks Humalog 1-6 units per SSI  Current Orders:Lantus 28 units daily      Novolog Moderate Correction Scale/ SSI (0-15 units) TID AC + HS      Novolog 8 units TID with meals      Note that patient saw his Endocrinologist Dr. Gabriel Carina with [facility] on 06/05/2017--At that visit pt was instructed to take the following: Lantus 28 units daily Humalog 10 units Breakfast/ 8 units Lunch/ 12 units Dinner Humalog 4 units with Snacks Humalog 1-6 units per SSI     Note that patient  did not get his ordered Lantus on the AM of 08/24--Reason stated in [facility] was "Medication not available".  Not sure why the Lantus wasn't given when it finally arrived from pharmacy??  Patient also documented as refusing Novolog at 12pm on 08/24 as well.  As a result, pt's CBG rose extremely high to 575 mg/dl by 7pm on 08/24.  Several large doses of Novolog were given and 28 units Lantus was finally given at 8pm on 08/24.  Note CBG remains elevated this AM.     MD- Please consider the following in-hospital insulin adjustments:  1. Increase Lantus to 33 units daily (if 28 unit dose already given this AM, please order an additional 5 units of Lantus X 1 dose to be given this AM as well)----This would be 20% increase total dose  2. Increase Novolog Meal Coverage slightly to: Novolog 10 units TID with meals      --Will follow patient during hospitalization--  Wyn Quaker RN, MSN, CDE Diabetes Coordinator Inpatient Glycemic Control Team Team Pager: [phone] (8a-5p)

## 2017-09-30 NOTE — Progress Notes (Signed)
Physical Therapy Treatment Patient Details Name: Jordan [name] MRN: [id number] DOB: [birth year] Today's Date: 09/30/2017    History of Present Illness Jordan [name] is an 67 y.o. male. with a medical history of type 1 diabetes, previous CVA, atrial fibrillation, colon cancer status post resection, and hyperthyroidism who was admitted with acute renal failure, acute encephalopathy, and hyperkalemia.      PT Comments    Pt was able to move more today. Pt A+O x 4 on PT arrival.  Pt is now mod ind with bed mobility, but needs min assist and RW for sit to stand. Pt has poor motor planning and needs VC's and physcial cues for proper foot placement with transfers and with Ambulation. In particular RLE is uncoordinated 2/2 hx of CVA. Pt was able to ambulate 15 w/ RW and min assist from therapist. Pt walked 5 ft w/o AD and was very unsteady and needed mod assist to avoid LOB. HR increased to 140's with ambulation and activity was terminated. RN notified and arrived to assess pt HR and administer medications. Pt HR is limited factor in pt ambulation distance but at this time his dynamic balance is poor and his awareness of LOB is low. Pt is not currently safe to go home due to low activity tolerance, decreased balance and decreased safety awareness. Recommend transition to STR upon discharge from acute hospitalization.  Follow Up Recommendations  SNF     Equipment Recommendations  Rolling walker with 5" wheels    Recommendations for Other Services       Precautions / Restrictions Precautions Precautions: Fall Precaution Comments: Aphasia Restrictions Weight Bearing Restrictions: No Other Position/Activity Restrictions: Nephrostomy bags bilat. R sided weakness    Mobility  Bed Mobility Overal bed mobility: Needs Assistance Bed Mobility: Supine to Sit     Supine to sit: Modified independent (Device/Increase time)     General bed mobility comments: Pt finding midline more easily. HR  increases to 120's with sitting EOB.   Transfers Overall transfer level: Needs assistance Equipment used: Rolling walker (2 wheeled) Transfers: Sit to/from Stand Sit to Stand: Min assist         General transfer comment: Pt needs VC's for proper placement of BLE. Pt has poor motor planning and does not sequence activtity correctly. Pt needs RW to complete STS, and min assist at pelvis for shifting weigth anteriorly.  PT HR increases to 140's with standing and ambulation activtities.  Ambulation/Gait Ambulation/Gait assistance: Min assist Gait Distance (Feet): 15 Feet Assistive device: Rolling walker (2 wheeled)   Gait velocity: decreased   General Gait Details: Pt gait is uncoordinated and unsteady. Pt RLE has decreased cooridinaiton and is not placed under COG. Pt needs min assist from RW and therapist to avoid LOB. W/o RW needs mod assist. Seems unaware of decreaed stability.   Stairs             Wheelchair Mobility    Modified Rankin (Stroke Patients Only)       Balance Overall balance assessment: Needs assistance Sitting-balance support: Feet supported;No upper extremity supported Sitting balance-Leahy Scale: Good   Postural control: Posterior lean;Left lateral lean   Standing balance-Leahy Scale: Good Standing balance comment: Pt static balance is good, accepting mod perturbations and does not need BUE for help.         Rhomberg - Eyes Opened: 30(accepts perturbations)     High Level Balance Comments: Pt is stable statically, but dynamic balance is poor. Requires RW for safe weigth  shifting and for safe ambulation.             Cognition Arousal/Alertness: Awake/alert Behavior During Therapy: Flat affect Overall Cognitive Status: Impaired/Different from baseline Area of Impairment: Attention;Following commands;Problem solving                   Current Attention Level: Sustained   Following Commands: Follows one step commands  consistently;Follows multi-step commands consistently     Problem Solving: Slow processing;Decreased initiation;Requires verbal cues;Difficulty sequencing;Requires tactile cues General Comments: Poor motor palnning with walking and transfers.       Exercises Other Exercises Other Exercises: Bed mobilty: Supine to sit mod ind; Trasnfers: sit to stand min asssit w/RW; Ambualtion: 15 ft w/ RW min assist, 5 ft w/o AD mod assist.     General Comments        Pertinent Vitals/Pain Pain Assessment: No/denies pain    Home Living                      Prior Function            PT Goals (current goals can now be found in the care plan section) Acute Rehab PT Goals Patient Stated Goal: To go home PT Goal Formulation: With patient Progress towards PT goals: Progressing toward goals    Frequency    Min 2X/week      PT Plan Current plan remains appropriate    Co-evaluation              AM-PAC PT "6 Clicks" Daily Activity  Outcome Measure  Difficulty turning over in bed (including adjusting bedclothes, sheets and blankets)?: A Little Difficulty moving from lying on back to sitting on the side of the bed? : A Little Difficulty sitting down on and standing up from a chair with arms (e.g., wheelchair, bedside commode, etc,.)?: Unable Help needed moving to and from a bed to chair (including a wheelchair)?: A Little Help needed walking in hospital room?: A Little Help needed climbing 3-5 steps with a railing? 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

## 2017-09-30 NOTE — Progress Notes (Signed)
SUBJECTIVE: Pt is lying in bed, resting comfortably, reports feeling well and denies chest pain, palpitations, but has intermittent mild shortness of breath.   Vitals:   09/29/17 1612 09/29/17 1929 09/30/17 0416 09/30/17 0743  BP: (!) 152/83 (!) 148/85 (!) 163/89 (!) 136/93  Pulse: 96 (!) 106 95 (!) 106  Resp:  17 16   Temp: 98.3 F (36.8 C) 98.8 F (37.1 C) 98.4 F (36.9 C)   TempSrc: Oral Oral Oral   SpO2: 97% 99% 100% 99%  Weight:   75.8 kg   Height:        Intake/Output Summary (Last 24 hours) at 09/30/2017 0845 Last data filed at 09/30/2017 5397 Gross per 24 hour  Intake 300 ml  Output 2075 ml  Net -1775 ml    LABS: Basic Metabolic Panel: Recent Labs    09/28/17 2030 09/29/17 1059  NA 132* 138  K 4.9 4.4  CL 102 104  CO2 23 25  GLUCOSE 565* 229*  BUN 31* 29*  CREATININE 2.32* 2.15*  CALCIUM 8.1* 8.5*   Liver Function Tests: No results for input(s): AST, ALT, ALKPHOS, BILITOT, PROT, ALBUMIN in the last 72 hours. No results for input(s): LIPASE, AMYLASE in the last 72 hours. CBC: Recent Labs    09/28/17 0309  WBC 9.2  HGB 10.5*  HCT 30.8*  MCV 88.5  PLT 249   Cardiac Enzymes: Recent Labs    09/27/17 1004 09/27/17 1412 09/27/17 2019  TROPONINI 0.03* <0.03 <0.03   BNP: Invalid input(s): POCBNP D-Dimer: No results for input(s): DDIMER in the last 72 hours. Hemoglobin A1C: No results for input(s): HGBA1C in the last 72 hours. Fasting Lipid Panel: No results for input(s): CHOL, HDL, LDLCALC, TRIG, CHOLHDL, LDLDIRECT in the last 72 hours. Thyroid Function Tests: No results for input(s): TSH, T4TOTAL, T3FREE, THYROIDAB in the last 72 hours.  Invalid input(s): FREET3 Anemia Panel: No results for input(s): VITAMINB12, FOLATE, FERRITIN, TIBC, IRON, RETICCTPCT in the last 72 hours.   PHYSICAL EXAM General: Pale, weak lying in bed, resting comfortably.  HEENT:  Normocephalic and atramatic Neck:  No JVD.  Lungs: Clear bilaterally to auscultation  and percussion. Heart: Tachycardic Abdomen: Bowel sounds are positive, abdomen soft and non-tender  Msk:  Back normal, normal gait. Normal strength and tone for age. Extremities: No clubbing, cyanosis or edema.   Neuro: Alert and oriented X 3. Psych:  Good affect, responds appropriately  TELEMETRY: Sinus tachycardia 115bpm  ASSESSMENT AND PLAN: Paroxysmal atrial fibrillation, currently in sinus tachycardia taking amiodarone 400mg  BID.  Continue amiodarone and will wean to 400mg /day on discharge and will add metoprolol 25mg  BID, hold for HR <70bpm. Follow up with pts primary cardiologist Dr. Ubaldo Glassing in outpatient setting.   Active Problems:   Acute kidney injury ([place])    Jake Bathe, NP-C 09/30/2017 8:45 AM Cell: [phone]

## 2017-09-30 NOTE — Progress Notes (Signed)
Initial Nutrition Assessment  DOCUMENTATION CODES:   Not applicable  INTERVENTION:   Premier Protein BID, each supplement provides 160 kcal and 30 grams of protein.   MVI daily  Vitamin C 228m po BID  NUTRITION DIAGNOSIS:   Increased nutrient needs related to acute illness as evidenced by increased estimated needs.  GOAL:   Patient will meet greater than or equal to 90% of their needs  MONITOR:   PO intake, Supplement acceptance, Labs, Weight trends, Skin, I & O's  REASON FOR ASSESSMENT:   LOS    ASSESSMENT:   67y.o. 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 Pt found to have acute renal failure with metabolic acidosis and obstructive uropathy: requiring hemodialysis and now s/p bilateral IR nephrostomy tube placement on 8/19   Met with pt and pt's wife in room today. Pt reports good appetite and oral intake at baseline but reports poor appetite and oral intake for 4-5 days pta and for the first several days he was admitted. Pt reports that his appetite has improved; pt eating 100% of meals. Pt does not drink any supplements at home but is willing to try chocolate Premier Protein here. Per chart, pt with 15lb(8%) wt loss in 3 months; this is significant. RD will order supplements and vitamins to help pt meet his estimated needs. Per chart, pt with 14lb wt loss since admit likely r/t fluid changes.   Medications reviewed and include: pepcid, folic acid, heparin, insulin, MVI  Labs reviewed: BUN 29(H), creat 2.15(H)- 8/25 cbgs- 283, 146 x 24 hrs AIC 7.2(H)- 8/17  UOP: right urostomy 16030m Left 47523motal 2075m37mUTRITION - FOCUSED PHYSICAL EXAM:    Most Recent Value  Orbital Region  No depletion  Upper Arm Region  No depletion  Thoracic and Lumbar Region  No depletion  Buccal Region  No depletion  Temple Region  No  depletion  Clavicle Bone Region  No depletion  Clavicle and Acromion Bone Region  No depletion  Scapular Bone Region  No depletion  Dorsal Hand  No depletion  Patellar Region  Moderate depletion  Anterior Thigh Region  Mild depletion  Posterior Calf Region  Moderate depletion  Edema (RD Assessment)  None  Hair  Reviewed  Eyes  Reviewed  Mouth  Reviewed  Skin  Reviewed  Nails  Reviewed     Diet Order:   Diet Order            DIET DYS 3 Room service appropriate? Yes with Assist; Fluid consistency: Thin  Diet effective now             EDUCATION NEEDS:   Education needs have been addressed  Skin:  Skin Assessment: Reviewed RN Assessment(ecchymosis )  Last BM:  8/24- type 5  Height:   Ht Readings from Last 1 Encounters:  09/27/17 _0  (1.778 m)    Weight:   Wt Readings from Last 1 Encounters:  09/30/17 75.8 kg    Ideal Body Weight:  75.45 kg  BMI:  Body mass index is 23.98 kg/m.  Estimated Nutritional Needs:   Kcal:  1900-2200kcal/day   Protein:  91-106g/day   Fluid:  >1.9L/day   CaseKoleen Distance RD, LDN Pager #- [phone]#- [phone] Hours Pager: [phone]

## 2017-09-30 NOTE — Progress Notes (Signed)
Urology Consult Follow Up  Subjective: Patient is now on the floor, eating dinner.  No complaints.  Foley out.  Nephrostomy tubes in place.  Anti-infectives: Anti-infectives (From admission, onward)   Start     Dose/Rate Route Frequency Ordered Stop   09/22/17 1430  ciprofloxacin (CIPRO) IVPB 400 mg     400 mg 200 mL/hr over 60 Minutes Intravenous  Once 09/22/17 1421 09/22/17 1721   09/22/17 1415  ciprofloxacin (CIPRO) IVPB 400 mg  Status:  Discontinued     400 mg 200 mL/hr over 60 Minutes Intravenous Every 12 hours 09/22/17 1414 09/22/17 1421   09/22/17 0600  cefTRIAXone (ROCEPHIN) 1 g in sodium chloride 0.9 % 100 mL IVPB  Status:  Discontinued     1 g 200 mL/hr over 30 Minutes Intravenous Every 24 hours 09/21/17 1549 09/22/17 0354   09/22/17 0354  cefTRIAXone (ROCEPHIN) 2 g in sodium chloride 0.9 % 100 mL IVPB  Status:  Discontinued     2 g 200 mL/hr over 30 Minutes Intravenous Every 24 hours 09/22/17 0354 09/23/17 1117   09/21/17 1430  vancomycin (VANCOCIN) IVPB 1000 mg/200 mL premix     1,000 mg 200 mL/hr over 60 Minutes Intravenous  Once 09/21/17 1429 09/21/17 1644   09/21/17 1430  piperacillin-tazobactam (ZOSYN) IVPB 3.375 g     3.375 g 100 mL/hr over 30 Minutes Intravenous  Once 09/21/17 1429 09/21/17 1543      Current Facility-Administered Medications  Medication Dose Route Frequency Provider Last Rate Last Dose  . acetaminophen (TYLENOL) tablet 650 mg  650 mg Oral Q6H PRN Loletha Grayer, MD   650 mg at 09/25/17 1943   Or  . acetaminophen (TYLENOL) suppository 650 mg  650 mg Rectal Q6H PRN Loletha Grayer, MD      . amiodarone (PACERONE) tablet 400 mg  400 mg Oral BID Deboraha Sprang, MD   400 mg at 09/30/17 0846  . Chlorhexidine Gluconate Cloth 2 % PADS 6 each  6 each Topical Q0600 Murlean Iba, MD   6 each at 09/26/17 0606  . digoxin (LANOXIN) tablet 0.0625 mg  0.0625 mg Oral Daily Neoma Laming A, MD   0.0625 mg at 09/29/17 2126  . erythromycin ophthalmic ointment    Both Eyes Q6H Gouru, Aruna, MD   1 application at 96/75/91 1708  . famotidine (PEPCID) tablet 20 mg  20 mg Oral Daily Pernell Dupre, RPH   20 mg at 09/30/17 0846  . folic acid (FOLVITE) tablet 1 mg  1 mg Oral Daily Loletha Grayer, MD   1 mg at 09/30/17 0846  . heparin injection 5,000 Units  5,000 Units Subcutaneous Q8H Fritzi Mandes, MD   5,000 Units at 09/30/17 1246  . insulin aspart (novoLOG) injection 0-15 Units  0-15 Units Subcutaneous TID AC & HS Lance Coon, MD   5 Units at 09/30/17 1708  . insulin aspart (novoLOG) injection 10 Units  10 Units Subcutaneous TID WC Gouru, Aruna, MD   10 Units at 09/30/17 1704  . [START ON 10/01/2017] insulin glargine (LANTUS) injection 33 Units  33 Units Subcutaneous Daily Gouru, Aruna, MD      . loratadine (CLARITIN) tablet 10 mg  10 mg Oral Daily Gouru, Aruna, MD   10 mg at 09/30/17 1519  . metoprolol tartrate (LOPRESSOR) tablet 25 mg  25 mg Oral BID Jake Bathe, FNP   25 mg at 09/30/17 1018  . multivitamin with minerals tablet 1 tablet  1 tablet Oral Daily Loletha Grayer, MD  1 tablet at 09/30/17 0846  . protein supplement (PREMIER PROTEIN) liquid - approved for s/p bariatric surgery  11 oz Oral BID BM Gouru, Aruna, MD   11 oz at 09/30/17 1440  . rosuvastatin (CRESTOR) tablet 5 mg  5 mg Oral q1800 Loletha Grayer, MD   5 mg at 09/30/17 1708  . tamsulosin (FLOMAX) capsule 0.4 mg  0.4 mg Oral QPC supper Loletha Grayer, MD   0.4 mg at 09/30/17 1708  . vitamin C (ASCORBIC ACID) tablet 250 mg  250 mg Oral BID Gouru, Aruna, MD         Objective: Vital signs in last 24 hours: Temp:  [97.7 F (36.5 C)-98.8 F (37.1 C)] 97.7 F (36.5 C) (08/26 1621) Pulse Rate:  [78-106] 78 (08/26 1621) Resp:  [16-18] 18 (08/26 1621) BP: (136-163)/(84-93) 142/84 (08/26 1621) SpO2:  [99 %-100 %] 99 % (08/26 1621) Weight:  [75.8 kg] 75.8 kg (08/26 0416)  Intake/Output from previous day: 08/25 0701 - 08/26 0700 In: -  Out: 2075  [Urine:2075] Intake/Output this shift: Total I/O In: 1340 [P.O.:240; Other:1100] Out: 250 [Urine:250]   Physical Exam  Sitting up, eating dinner, alert and oriented, no acute distress Abdomen soft, nontender Bilateral nephrostomy tubes in place draining clear yellow urine  Lab Results:  Recent Labs    09/28/17 0309  WBC 9.2  HGB 10.5*  HCT 30.8*  PLT 249   BMET Recent Labs    09/28/17 2030 09/29/17 1059  NA 132* 138  K 4.9 4.4  CL 102 104  CO2 23 25  GLUCOSE 565* 229*  BUN 31* 29*  CREATININE 2.32* 2.15*  CALCIUM 8.1* 8.5*   PT/INR No results for input(s): LABPROT, INR in the last 72 hours. ABG No results for input(s): PHART, HCO3 in the last 72 hours.  Invalid input(s): PCO2, PO2  Studies/Results: No results found.   Assessment: 67 year old male with kidney injury secondary to obstructive uropathy status post bilateral nephrostomy tube placement with downward trending creatinine.  Clinically, she is improving now on the floor and sleeping discharge to rehab in the near future.  Foley out.   Plan: -Plan for outpatient cystoscopy to evaluate bladder for underlying bladder lesion -Patient is agreeable this plan, will arrange for follow-up in the near future  Urology will sign off, please page with any questions or concerns    LOS: 9 days    Hollice Espy 09/30/2017

## 2017-09-30 NOTE — Progress Notes (Signed)
[place] [facility] [place] NAME: Jordan [name]    MR#:  [id number]  DATE OF BIRTH:  March 20, 1950  SUBJECTIVE:  Patient is doing fine.  Reporting generalized weakness.  PT is recommending skilled nursing facility but patient prefers going home with home health.  Heart rate went up to 140 while working with physical therapy today REVIEW OF SYSTEMS:   Review of Systems  Constitutional: Negative for chills, fever and weight loss.  HENT: Negative for ear discharge, ear pain and nosebleeds.   Eyes: Negative for blurred vision, pain and discharge.  Respiratory: Negative for sputum production, shortness of breath, wheezing and stridor.   Cardiovascular: Negative for chest pain, palpitations, orthopnea and PND.  Gastrointestinal: Negative for abdominal pain, diarrhea, nausea and vomiting.  Genitourinary: Negative for frequency and urgency.  Musculoskeletal: Negative for back pain and joint pain.  Neurological: Negative for sensory change, speech change, focal weakness and weakness.  Psychiatric/Behavioral: Negative for depression and hallucinations. The patient is not nervous/anxious.    DRUG ALLERGIES:   Allergies  Allergen Reactions  . Amiodarone Other (See Comments)    Thyrotoxicosis per Murray Hodgkins, NP    VITALS:  Blood pressure (!) 136/93, pulse (!) 106, temperature 98.4 F (36.9 C), temperature source Oral, resp. rate 16, height 5\' 10"  (1.778 m), weight 75.8 kg, SpO2 99 %.  PHYSICAL EXAMINATION:   Physical Exam  GENERAL:  67 y.o.-year-old patient lying in the bed with no acute distress. Critically ill EYES: Pupils equal, round, reactive to light and accommodation. No scleral icterus. Extraocular muscles intact.  HEENT: Head atraumatic, normocephalic. Oropharynx and nasopharynx clear.  NECK:  Supple, no jugular venous distention. No thyroid enlargement, no tenderness.  LUNGS: Normal breath sounds bilaterally, no wheezing, rales,  rhonchi. No use of accessory muscles of respiration.  CARDIOVASCULAR: Irregularly regular no murmurs, rubs, or gallops.  ABDOMEN: Soft, nontender, nondistended. Bowel sounds present. No organomegaly or mass. Bilateral nephrostomy tubes+ EXTREMITIES: No cyanosis, clubbing or edema b/l.    NEUROLOGIC: unable to assess due to mentation. Moves all extremities well   PSYCHIATRIC:  patient is lethargic and very sleepy SKIN: No obvious rash, lesion, or ulcer.   LABORATORY PANEL:  CBC Recent Labs  Lab 09/28/17 0309  WBC 9.2  HGB 10.5*  HCT 30.8*  PLT 249    Chemistries  Recent Labs  Lab 09/26/17 0502 09/27/17 0447  09/29/17 1059  NA 152* 147*   < > 138  K 3.4* 3.3*   < > 4.4  CL 115* 108   < > 104  CO2 27 32   < > 25  GLUCOSE 211* 178*   < > 229*  BUN 25* 25*   < > 29*  CREATININE 2.32* 2.25*   < > 2.15*  CALCIUM 8.9 8.2*   < > 8.5*  MG 1.9 1.8  --   --   AST 20  --   --   --   ALT 12  --   --   --   ALKPHOS 80  --   --   --   BILITOT 1.0  --   --   --    < > = values in this interval not displayed.   Cardiac Enzymes Recent Labs  Lab 09/27/17 2019  TROPONINI <0.03   RADIOLOGY:  No results found. ASSESSMENT AND PLAN:  Jordan [name]  is a 67 y.o. male brought in by family secondary to altered mental status.  The patient  has a history of stroke and has trouble speaking his words and has baseline right-sided weakness.  He states his appetite has been poor for the past week.  In the ER, his creatinine was found to be 14.94 with a potassium of 6.1.   1.  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   -  Likely acute kidney injury from poor appetite and obstructive uropathy.  Hold nephrotoxic medications. -Patient irecieved urgent hemodialysis through right femoral HD Access--Now discontinued -Seen by urology. Patient is s/p  bilateral nephrostomy tubes secondary to hydronephrosis with possible obstructive mass in the bladder as opposed to  hemorrhagic debris--will need cystoscopy with bladder mass biopsy per Dr Erlene Quan at later date -creat 14.0--9.18--7.89--4.14--3.28--2.15 -UC no growth -d/c foley -Dr Erlene Quan will see next week and cystoscopy as out pt. Ok to remove foley  2.  Hyperkalemia.  Likely from acute kidney injury. - recieved IV fluid hydration.    pt got a dose of lokelma and IV calcium. -improved  3.  Type 1 diabetes mellitus.   Lantus dose increased to 33 units once daily -cont  ssi  4. Acute on chronic atrial fibrillation.  With his creatinine clearance being where it is the sotalol is contraindicated.    -continue his  cardizem, digoxin and amiodarone -no  long term anticoagulation according to Dr khan--d/ced heparin gtt  -pt will follow dr Ubaldo Glassing as out pt---his primary cardiologist  5.  History of stroke with right-sided weakness.  Physical therapy consultation noted   6.  Essential hypertension on propranolol.  Hold benazepril.  7.  Hyperlipidemia unspecified.  On low-dose Crestor.  CPK normal range  Patient lives alone at home. He has significant medical problems. He came in very sick with acute renal failure, septic shock, a fib now has bilateral nephrectomy tubes which is new to him. Given his severity of illness comorbidities and not much help at home as per my discussion with patient and patient's sister Dub Mikes he will benefit from rehab.  Patient is agreeable to go to go to rehab Plan of care discussed with the patient, the patient's sister and patient care management and social worker for discharge planning.  Disposition skilled nursing facility  Overall pt improving slowly  Case discussed with Care Management/Social Worker. Management plans discussed with the patient, family and they are in agreement.  CODE STATUS: full  DVT Prophylaxis: heparin  TOTAL TIME TAKING CARE OF THIS PATIENT: 33 minutes.  >50% time spent on counselling and coordination of care  POSSIBLE D/C IN 1-2 DAYS,  DEPENDING ON CLINICAL CONDITION.  Note: This dictation was prepared with Dragon dictation along with smaller phrase technology. Any transcriptional errors that result from this process are unintentional.  Jordan [name] M.D on 09/30/2017 at 2:26 PM  Between 7am to 6pm - Pager - [phone]  After 6pm go to [email or web address] - password [email or web address] [facility]  Office  [phone]  CC: Primary care physician; Albina Billet, MDPatient ID: Donalee Citrin Cabello, male   DOB: 1950-05-26, 67 y.o.   MRN: [id number]

## 2017-09-30 NOTE — Care Management Important Message (Signed)
Important Message  Patient Details  Name: Jordan [name] MRN: [id number] Date of Birth: [birth year]   Medicare Important Message Given:  Yes    Juliann Pulse A Rakel Junio 09/30/2017, 11:05 AM

## 2017-10-01 ENCOUNTER — Telehealth: Payer: Self-pay | Admitting: Urology

## 2017-10-01 LAB — CBC
HCT: 31 % — ABNORMAL LOW (ref 40.0–52.0)
HEMOGLOBIN: 10.9 g/dL — AB (ref 13.0–18.0)
MCH: 30.7 pg (ref 26.0–34.0)
MCHC: 35.1 g/dL (ref 32.0–36.0)
MCV: 87.5 fL (ref 80.0–100.0)
Platelets: 267 10*3/uL (ref 150–440)
RBC: 3.54 MIL/uL — ABNORMAL LOW (ref 4.40–5.90)
RDW: 13.4 % (ref 11.5–14.5)
WBC: 11.3 10*3/uL — ABNORMAL HIGH (ref 3.8–10.6)

## 2017-10-01 LAB — RENAL FUNCTION PANEL
ALBUMIN: 3 g/dL — AB (ref 3.5–5.0)
Anion gap: 9 (ref 5–15)
BUN: 32 mg/dL — ABNORMAL HIGH (ref 8–23)
CHLORIDE: 101 mmol/L (ref 98–111)
CO2: 25 mmol/L (ref 22–32)
CREATININE: 2 mg/dL — AB (ref 0.61–1.24)
Calcium: 8.7 mg/dL — ABNORMAL LOW (ref 8.9–10.3)
GFR calc Af Amer: 38 mL/min — ABNORMAL LOW (ref >60)
GFR calc non Af Amer: 33 mL/min — ABNORMAL LOW (ref >60)
GLUCOSE: 310 mg/dL — AB (ref 70–99)
POTASSIUM: 5 mmol/L (ref 3.5–5.1)
Phosphorus: 2.5 mg/dL (ref 2.5–4.6)
Sodium: 135 mmol/L (ref 135–145)

## 2017-10-01 LAB — DIGOXIN LEVEL: DIGOXIN LVL: 1 ng/mL (ref 0.8–2.0)

## 2017-10-01 LAB — GLUCOSE, CAPILLARY
GLUCOSE-CAPILLARY: 306 mg/dL — AB (ref 70–99)
Glucose-Capillary: 275 mg/dL — ABNORMAL HIGH (ref 70–99)

## 2017-10-01 MED ORDER — DIGOXIN 62.5 MCG PO TABS: 0.0625 mg | ORAL_TABLET | Freq: Every day | ORAL | Status: AC

## 2017-10-01 MED ORDER — PREMIER PROTEIN SHAKE: 11.0000 [oz_av] | Freq: Two times a day (BID) | ORAL | 0 refills | Status: AC

## 2017-10-01 MED ORDER — LORATADINE 10 MG PO TABS: 10.0000 mg | ORAL_TABLET | Freq: Every day | ORAL | Status: AC

## 2017-10-01 MED ORDER — TAMSULOSIN HCL 0.4 MG PO CAPS: 0.4000 mg | ORAL_CAPSULE | Freq: Every day | ORAL | Status: AC

## 2017-10-01 MED ORDER — INSULIN GLARGINE 100 UNIT/ML ~~LOC~~ SOLN: 37.0000 [IU] | Freq: Every day | SUBCUTANEOUS | 11 refills | Status: AC

## 2017-10-01 MED ORDER — AMIODARONE HCL 400 MG PO TABS: 400.0000 mg | ORAL_TABLET | Freq: Every day | ORAL | Status: DC

## 2017-10-01 MED ORDER — INSULIN ASPART 100 UNIT/ML ~~LOC~~ SOLN: 0.0000 [IU] | Freq: Three times a day (TID) | SUBCUTANEOUS | 11 refills | Status: AC

## 2017-10-01 MED ORDER — INSULIN ASPART 100 UNIT/ML ~~LOC~~ SOLN: 10.0000 [IU] | Freq: Three times a day (TID) | SUBCUTANEOUS | 11 refills | Status: AC

## 2017-10-01 MED ORDER — ASCORBIC ACID 250 MG PO TABS: 250.0000 mg | ORAL_TABLET | Freq: Two times a day (BID) | ORAL | Status: AC

## 2017-10-01 MED ORDER — ACETAMINOPHEN 325 MG PO TABS: 650.0000 mg | ORAL_TABLET | Freq: Four times a day (QID) | ORAL | Status: AC | PRN

## 2017-10-01 MED ORDER — FAMOTIDINE 20 MG PO TABS: 20.0000 mg | ORAL_TABLET | Freq: Every day | ORAL | Status: AC

## 2017-10-01 MED ORDER — METOPROLOL TARTRATE 25 MG PO TABS: 25.0000 mg | ORAL_TABLET | Freq: Two times a day (BID) | ORAL | Status: AC

## 2017-10-01 MED ORDER — ERYTHROMYCIN 5 MG/GM OP OINT: TOPICAL_OINTMENT | Freq: Four times a day (QID) | OPHTHALMIC | 0 refills | Status: AC

## 2017-10-01 MED ORDER — ROSUVASTATIN CALCIUM 5 MG PO TABS: 5.0000 mg | ORAL_TABLET | Freq: Every day | ORAL | Status: DC

## 2017-10-01 MED ORDER — ADULT MULTIVITAMIN W/MINERALS CH: 1.0000 | ORAL_TABLET | Freq: Every day | ORAL | Status: AC

## 2017-10-01 NOTE — Discharge Summary (Addendum)
[name] NAME: Jordan [name]    MR#:  [id number]  DATE OF BIRTH:  07-12-1950  DATE OF ADMISSION:  09/21/2017 ADMITTING PHYSICIAN: Loletha Grayer, MD  DATE OF DISCHARGE:  10/01/17  PRIMARY CARE PHYSICIAN: Albina Billet, MD    ADMISSION DIAGNOSIS:  Acute kidney injury ([place]) [N17.9] Acute renal failure, unspecified acute renal failure type ([place]) [N17.9] Altered mental status, unspecified altered mental status type [R41.82]  DISCHARGE DIAGNOSIS:  Active Problems:   Acute kidney injury ([place]) Bladder lesion Atrial fibrillation with RVR Generalized weakness  SECONDARY DIAGNOSIS:   Past Medical History:  Diagnosis Date  . Atrial fibrillation ([place])   . Cancer ([place])    a. 06/2011 b. s/p right hemicolectomy/ colon CA  . Colon polyps   . Dysrhythmia   . Hypertension   . Hyperthyroidism    a. 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 Stroke ([place]) 03/2012   a. residual right sided weakness  . Type 1 diabetes ([place])    a. on insulin b. prior DKA     HOSPITAL COURSE:  HPI  Jordan [name]  is a 67 y.o. male brought in by family secondary to altered mental status.  The patient has a history of stroke and has trouble speaking his words and has baseline right-sided weakness.  He states his appetite has been poor for the past week.  His brother who throws out his trash noticed that there was not much food in the garbage can this past week.  He has had some bruises behind the right shoulder.  The patient has not been able to tell me if he has been falling down.  The patient lives alone.  He does not use any devices with walking around.  In the ER, his creatinine was found to be 14.94 with a potassium of 6.1.  White blood cell count also elevated at 32.3 with a left shift.  Hospitalist services were contacted for further evaluation   1. Acute kidney injury with severe metabolic acidosis suspected obstructive  neuropathy. -Likely acute kidney injury from poor appetite and obstructive uropathy. Hold nephrotoxic medications. -Patient irecieved urgent hemodialysis through right femoral HD Access--Now discontinued -Seen by urology. Patient is s/p  bilateral nephrostomy tubes secondary to hydronephrosis with possible obstructive mass in the bladder as opposed to hemorrhagic debris--will need cystoscopy with bladder mass biopsy per Dr Erlene Quan at later date -creat 14.0--9.18--7.89--4.14--3.28--2.15-2.0 -UC no growth.  Blood cultures are negative -d/c foley -Dr Erlene Quan will see next week and cystoscopy as out pt for bladder lesion ok to remove foley, discontinue Foley catheter  2. Hyperkalemia. Likely from acute kidney injury. - recievedIV fluid hydration.  pt got a dose of lokelma and IV calcium. -improved  3. Type 1 diabetes mellitus.  Lantus dose increased to 37 units once daily -cont  ssi  4.Acute on chronic atrial fibrillation. With his creatinine clearance being where it is the sotalol is contraindicated.  -continue his  cardizem, digoxin and amiodarone -Please monitor dig levels -As per Dr. Sharol Roussel recommendation --d/ced heparin gtt , OK to resume home med  Xarelto per dr.Khan -pt will follow-up with cardiology on August 30 at 10 AM  5. History of stroke with right-sided weakness. Physical therapy consultation noted -recommended skilled nursing facility  6. Essential hypertension on metoprolol for rate control,hold benazepril.  7. Hyperlipidemia unspecified. On low-dose Crestor. CPK normal range  Patient lives alone at home. He  has significant medical problems. He came in very sick with acute renal failure, septic shock, a fib now has bilateral nephrectomy tubes which is new to him. Given his severity of illness comorbidities and not much help at home as per my discussion with patient and patient's sister Dub Mikes he will benefit from rehab.  Patient is agreeable to  go to go to rehab Plan of care discussed with the patient, the patient's sister and patient care management and social worker for discharge planning.  Disposition skilled nursing facility   DISCHARGE CONDITIONS:   STABLE  CONSULTS OBTAINED:  Treatment Team:  Murlean Iba, MD Irine Seal, MD Dionisio David, MD   PROCEDURES s/p  bilateral nephrostomy tubes secondary to hydronephrosis   DRUG ALLERGIES:   Allergies  Allergen Reactions  . Amiodarone Other (See Comments)    Thyrotoxicosis per Murray Hodgkins, NP    DISCHARGE MEDICATIONS:   Allergies as of 10/01/2017      Reactions   Amiodarone Other (See Comments)   Thyrotoxicosis per Murray Hodgkins, NP      Medication List    STOP taking these medications   benazepril 20 MG tablet Commonly known as:  LOTENSIN   insulin lispro 100 UNIT/ML injection Commonly known as:  HUMALOG   multivitamin capsule Replaced by:  multivitamin with minerals Tabs tablet   propranolol 80 MG tablet Commonly known as:  INDERAL   rivaroxaban 20 MG Tabs tablet Commonly known as:  XARELTO   sotalol 80 MG tablet Commonly known as:  BETAPACE     TAKE these medications   acetaminophen 325 MG tablet Commonly known as:  TYLENOL Take 2 tablets (650 mg total) by mouth every 6 (six) hours as needed for mild pain (or Fever >/= 101).   amiodarone 400 MG tablet Commonly known as:  PACERONE Take 1 tablet (400 mg total) by mouth daily.   ascorbic acid 250 MG tablet Commonly known as:  VITAMIN C Take 1 tablet (250 mg total) by mouth 2 (two) times daily.   Digoxin 62.5 MCG Tabs Take 0.0625 mg by mouth daily.   erythromycin ophthalmic ointment Place into both eyes every 6 (six) hours for 4 days.   famotidine 20 MG tablet Commonly known as:  PEPCID Take 1 tablet (20 mg total) by mouth daily. Start taking on:  3/66/4403   folic acid 1 MG tablet Commonly known as:  FOLVITE Take 1 mg by mouth daily.   insulin aspart 100  UNIT/ML injection Commonly known as:  novoLOG Inject 10 Units into the skin 3 (three) times daily with meals.   insulin aspart 100 UNIT/ML injection Commonly known as:  novoLOG Inject 0-15 Units into the skin 4 (four) times daily -  before meals and at bedtime. CBG < 70: implement hypoglycemia protocol CBG 70 - 120: 0 units CBG 121 - 150: 2 units CBG 151 - 200: 3 units CBG 201 - 250: 5 units CBG 251 - 300: 8 units CBG 301 - 350: 11 units CBG 351 - 400: 15 units CBG > 400: call MD and obtain STAT lab verification   insulin glargine 100 UNIT/ML injection Commonly known as:  LANTUS Inject 0.37 mLs (37 Units total) into the skin daily. Start taking on:  10/02/2017 What changed:    how much to take  how to take this  when to take this  additional instructions   loratadine 10 MG tablet Commonly known as:  CLARITIN Take 1 tablet (10 mg total) by mouth daily. Start taking  on:  10/02/2017   metoprolol tartrate 25 MG tablet Commonly known as:  LOPRESSOR Take 1 tablet (25 mg total) by mouth 2 (two) times daily.   multivitamin with minerals Tabs tablet Take 1 tablet by mouth daily. Start taking on:  10/02/2017 Replaces:  multivitamin capsule   protein supplement shake Liqd Commonly known as:  PREMIER PROTEIN Take 325 mLs (11 oz total) by mouth 2 (two) times daily between meals.   rosuvastatin 5 MG tablet Commonly known as:  CRESTOR Take 1 tablet (5 mg total) by mouth daily at 6 PM. What changed:  when to take this   tamsulosin 0.4 MG Caps capsule Commonly known as:  FLOMAX Take 1 capsule (0.4 mg total) by mouth daily after supper.        DISCHARGE INSTRUCTIONS:   [facility] Follow-up with Dr. Zollie Scale, nephrology in 1 week Follow-up with Dr. Erlene Quan urology in 1 week Follow-up with Dr. Humphrey Rolls cardiology on Friday, August 30 at 10 AM   DIET:  Cardiac diet and Diabetic diet-dysphagia-3  DISCHARGE CONDITION:   Fair  ACTIVITY:  Activity as tolerated  OXYGEN:  Home Oxygen: No.   Oxygen Delivery: room air  DISCHARGE LOCATION:  nursing home   If you experience worsening of your admission symptoms, develop shortness of breath, life threatening emergency, suicidal or homicidal thoughts you must seek medical attention immediately by calling 911 or calling your MD immediately  if symptoms less severe.  You Must read complete instructions/literature along with all the possible adverse reactions/side effects for all the Medicines you take and that have been prescribed to you. Take any new Medicines after you have completely understood and accpet all the possible adverse reactions/side effects.   Please note  You were cared for by a hospitalist during your hospital stay. If you have any questions about your discharge medications or the care you received while you were in the hospital after you are discharged, you can call the unit and asked to speak with the hospitalist on call if the hospitalist that took care of you is not available. Once you are discharged, your primary care physician will handle any further medical issues. Please note that NO REFILLS for any discharge medications will be authorized once you are discharged, as it is imperative that you return to your primary care physician (or establish a relationship with a primary care physician if you do not have one) for your aftercare needs so that they can reassess your need for medications and monitor your lab values.     Today  Chief Complaint  Patient presents with  . Altered Mental Status   Patient is feeling weak otherwise feels better  ROS:  CONSTITUTIONAL: Denies fevers, chills. Denies any fatigue, weakness.  EYES: Denies blurry vision, double vision, eye pain. EARS, NOSE, THROAT: Denies tinnitus, ear pain, hearing loss. RESPIRATORY: Denies cough, wheeze, shortness of breath.  CARDIOVASCULAR: Denies chest pain, palpitations,  edema.  GASTROINTESTINAL: Denies nausea, vomiting, diarrhea, abdominal pain. Denies bright red blood per rectum. GENITOURINARY: Denies dysuria, hematuria. ENDOCRINE: Denies nocturia or thyroid problems. HEMATOLOGIC AND LYMPHATIC: Denies easy bruising or bleeding. SKIN: Denies rash or lesion. MUSCULOSKELETAL: Denies pain in neck, back, shoulder, knees, hips or arthritic symptoms.  NEUROLOGIC: Denies paralysis, paresthesias.  PSYCHIATRIC: Denies anxiety or depressive symptoms.   VITAL SIGNS:  Blood pressure (!) 153/84, pulse 82, temperature 98.7 F (37.1 C), temperature source Oral, resp. rate 19, height 5\' 10"  (1.778 m), weight  75.8 kg, SpO2 99 %.  I/O:    Intake/Output Summary (Last 24 hours) at 10/01/2017 1201 Last data filed at 10/01/2017 0951 Gross per 24 hour  Intake 1070 ml  Output 2350 ml  Net -1280 ml    PHYSICAL EXAMINATION:  GENERAL:  67 y.o.-year-old patient lying in the bed with no acute distress.  EYES: Pupils equal, round, reactive to light and accommodation. No scleral icterus. Extraocular muscles intact.  HEENT: Head atraumatic, normocephalic. Oropharynx and nasopharynx clear.  NECK:  Supple, no jugular venous distention. No thyroid enlargement, no tenderness.  LUNGS: Normal breath sounds bilaterally, no wheezing, rales,rhonchi or crepitation. No use of accessory muscles of respiration.  CARDIOVASCULAR: S1, S2 normal. No murmurs, rubs, or gallops.  ABDOMEN: Soft, non-tender, non-distended. Bowel sounds present. No organomegaly or mass.  EXTREMITIES: No pedal edema, cyanosis, or clubbing.  NEUROLOGIC: Cranial nerves II through XII are intact.  Global weakness in all extremities sensation intact. Gait not checked.  PSYCHIATRIC: The patient is alert and oriented x 3.  SKIN: No obvious rash, lesion, or ulcer.   DATA REVIEW:   CBC Recent Labs  Lab 09/28/17 0309  WBC 9.2  HGB 10.5*  HCT 30.8*  PLT 249    Chemistries  Recent Labs  Lab 09/26/17 0502  09/27/17 0447  10/01/17 0818  NA 152* 147*   < > 135  K 3.4* 3.3*   < > 5.0  CL 115* 108   < > 101  CO2 27 32   < > 25  GLUCOSE 211* 178*   < > 310*  BUN 25* 25*   < > 32*  CREATININE 2.32* 2.25*   < > 2.00*  CALCIUM 8.9 8.2*   < > 8.7*  MG 1.9 1.8  --   --   AST 20  --   --   --   ALT 12  --   --   --   ALKPHOS 80  --   --   --   BILITOT 1.0  --   --   --    < > = values in this interval not displayed.    Cardiac Enzymes Recent Labs  Lab 09/27/17 2019  TROPONINI <0.03    Microbiology Results  Results for orders placed or performed during the hospital encounter of 09/21/17  Urine culture     Status: None   Collection Time: 09/21/17  2:31 PM  Result Value Ref Range Status   Specimen Description   Final    URINE, RANDOM Performed at [facility], [address] [place], [place] [address]    Special Requests   Final    NONE Performed at [facility], [address], [place], [place] [address]    Culture   Final    NO GROWTH Performed at [facility], [place] [address], [place], [place] [address]    Report Status 09/22/2017 FINAL  Final  Blood culture (routine x 2)     Status: None   Collection Time: 09/21/17  2:50 PM  Result Value Ref Range Status   Specimen Description BLOOD Blood Culture adequate volume  Final   Special Requests   Final    BOTTLES DRAWN AEROBIC AND ANAEROBIC BLOOD RIGHT ARM   Culture   Final    NO GROWTH 5 DAYS Performed at [facility], [address] [place],  [address]    Report Status 09/26/2017 FINAL  Final  Blood culture (routine x 2)  Status: None   Collection Time: 09/21/17  2:50 PM  Result Value Ref Range Status   Specimen Description BLOOD BLOOD LEFT HAND  Final   Special Requests BOTTLES DRAWN AEROBIC AND ANAEROBIC  Final   Culture   Final    NO GROWTH 5 DAYS Performed at [facility], [address] [place], [place] [address]    Report Status 09/26/2017 FINAL  Final   MRSA PCR Screening     Status: None   Collection Time: 09/22/17  7:22 AM  Result Value Ref Range Status   MRSA by PCR NEGATIVE NEGATIVE Final    Comment:        The GeneXpert MRSA Assay (FDA approved for NASAL specimens only), is one component of a comprehensive MRSA colonization surveillance program. It is not intended to diagnose MRSA infection nor to guide or monitor treatment for MRSA infections. Performed at [facility], [address] [place], [place] [address]     RADIOLOGY:  No results found.  EKG:   Orders placed or performed during the hospital encounter of 09/21/17  . EKG 12-Lead  . EKG 12-Lead  . EKG 12-Lead  . EKG 12-Lead  . EKG 12-Lead  . EKG 12-Lead  . EKG 12-Lead  . EKG 12-Lead  . EKG 12-Lead  . EKG 12-Lead      Management plans discussed with the patient, family and they are in agreement.  CODE STATUS:     Code Status Orders  (From admission, onward)         Start     Ordered   09/21/17 1548  Full code  Continuous     09/21/17 1548        Code Status History    Date Active Date Inactive Code Status Order ID Comments User Context   03/21/2014 0955 03/30/2014 1829 Full Code [id number]  Jolaine Artist, MD Inpatient   03/20/2014 2024 03/21/2014 0955 Full Code [id number]  Luz Brazen, MD Inpatient   03/20/2014 2023 03/20/2014 2024 DNR [id number]  Luz Brazen, MD Inpatient   03/19/2014 2205 03/20/2014 2023 Full Code [id number]  Raliegh Ip, MD Inpatient      TOTAL TIME TAKING CARE OF THIS PATIENT: 45  minutes.   Note: This dictation was prepared with Dragon dictation along with smaller phrase technology. Any transcriptional errors that result from this process are unintentional.   @[facility] @  on 10/01/2017 at 12:01 PM  Between 7am to 6pm - Pager - [phone]  After 6pm go to [email or web address] - password [email or web address] [facility]  [place]  Office  [phone]  CC: Primary care physician; Albina Billet, MD

## 2017-10-01 NOTE — Progress Notes (Signed)
SUBJECTIVE: doing well   Vitals:   09/30/17 1621 09/30/17 1936 10/01/17 0516 10/01/17 0757  BP: (!) 142/84 131/66 132/67 (!) 153/84  Pulse: 78 68 74 82  Resp: 18 18 18 19   Temp: 97.7 F (36.5 C) 98 F (36.7 C) 99.2 F (37.3 C) 98.7 F (37.1 C)  TempSrc:  Oral Oral Oral  SpO2: 99% 97% 98% 99%  Weight:      Height:        Intake/Output Summary (Last 24 hours) at 10/01/2017 1500 Last data filed at 10/01/2017 1359 Gross per 24 hour  Intake 960 ml  Output 2700 ml  Net -1740 ml    LABS: Basic Metabolic Panel: Recent Labs    09/29/17 1059 10/01/17 0818  NA 138 135  K 4.4 5.0  CL 104 101  CO2 25 25  GLUCOSE 229* 310*  BUN 29* 32*  CREATININE 2.15* 2.00*  CALCIUM 8.5* 8.7*  PHOS  --  2.5   Liver Function Tests: Recent Labs    10/01/17 0818  ALBUMIN 3.0*   No results for input(s): LIPASE, AMYLASE in the last 72 hours. CBC: Recent Labs    10/01/17 1154  WBC 11.3*  HGB 10.9*  HCT 31.0*  MCV 87.5  PLT 267   Cardiac Enzymes: No results for input(s): CKTOTAL, CKMB, CKMBINDEX, TROPONINI in the last 72 hours. BNP: Invalid input(s): POCBNP D-Dimer: No results for input(s): DDIMER in the last 72 hours. Hemoglobin A1C: No results for input(s): HGBA1C in the last 72 hours. Fasting Lipid Panel: No results for input(s): CHOL, HDL, LDLCALC, TRIG, CHOLHDL, LDLDIRECT in the last 72 hours. Thyroid Function Tests: No results for input(s): TSH, T4TOTAL, T3FREE, THYROIDAB in the last 72 hours.  Invalid input(s): FREET3 Anemia Panel: No results for input(s): VITAMINB12, FOLATE, FERRITIN, TIBC, IRON, RETICCTPCT in the last 72 hours.   PHYSICAL EXAM General: Well developed, well nourished, in no acute distress HEENT:  Normocephalic and atramatic Neck:  No JVD.  Lungs: Clear bilaterally to auscultation and percussion. Heart: HRRR . Normal S1 and S2 without gallops or murmurs.  Abdomen: Bowel sounds are positive, abdomen soft and non-tender  Msk:  Back normal, normal  gait. Normal strength and tone for age. Extremities: No clubbing, cyanosis or edema.   Neuro: Alert and oriented X 3. Psych:  Good affect, responds appropriately  TELEMETRY: NSR  ASSESSMENT AND PLAN:Afib with RVR and CVA, need to go home on amiodrone 400 daily, metoprolol 25 bid and has CRI, with creat 2, thus xarelto 15 daily. F/u Friday at 10 am.  Active Problems:   Acute kidney injury ([place])    Kaiyana Bedore A, MD, [facility] 10/01/2017 3:00 PM

## 2017-10-01 NOTE — Telephone Encounter (Signed)
App made ° ° °Jordan [name]  °

## 2017-10-01 NOTE — Progress Notes (Signed)
[facility]  ROUNDING NOTE   Subjective:   No complaints  Creatinine 2 (2.15) (2.32)  UOP: right urostomy 1380mL. Left 533mL Total 2984mL   Objective:  Vital signs in last 24 hours:  Temp:  [97.7 F (36.5 C)-99.2 F (37.3 C)] 98.7 F (37.1 C) (08/27 0757) Pulse Rate:  [68-82] 82 (08/27 0757) Resp:  [18-19] 19 (08/27 0757) BP: (131-153)/(66-84) 153/84 (08/27 0757) SpO2:  [97 %-99 %] 99 % (08/27 0757)  Weight change:  Filed Weights   09/25/17 0445 09/27/17 1757 09/30/17 0416  Weight: 74.5 kg 79.2 kg 75.8 kg    Intake/Output: I/O last 3 completed shifts: In: 8366 [P.O.:720; Other:1100] Out: 2625 [Urine:2625]   Intake/Output this shift:  Total I/O In: 240 [P.O.:240] Out: 450 [Urine:450]  Physical Exam: General: No acute distress  Head: Normocephalic, atraumatic. Moist oral mucosal membranes  Eyes: Anicteric  Neck: Supple, trachea midline  Lungs:  Clear to auscultation, normal effort  Heart: regular  Abdomen:  Soft, nontender, bowel sounds present  Extremities: No peripheral edema.  Neurologic: nonfocal  Skin: No lesions  GU:  Bilateral urostomy tubes with clear yellow urine    Basic Metabolic Panel: Recent Labs  Lab 09/24/17 2308  09/26/17 0502 09/27/17 0447 09/28/17 0410 09/28/17 2030 09/29/17 1059 10/01/17 0818  NA 151*   < > 152* 147* 141 132* 138 135  K 3.4*   < > 3.4* 3.3* 4.5 4.9 4.4 5.0  CL 113*   < > 115* 108 106 102 104 101  CO2 24   < > 27 32 27 23 25 25   GLUCOSE 338*   < > 211* 178* 172* 565* 229* 310*  BUN 37*   < > 25* 25* 21 31* 29* 32*  CREATININE 3.39*   < > 2.32* 2.25* 1.91* 2.32* 2.15* 2.00*  CALCIUM 9.1   < > 8.9 8.2* 8.1* 8.1* 8.5* 8.7*  MG 1.8  --  1.9 1.8  --   --   --   --   PHOS 2.8  --  2.2* 2.5  --   --   --  2.5   < > = values in this interval not displayed.    Liver Function Tests: Recent Labs  Lab 09/26/17 0502 10/01/17 0818  AST 20  --   ALT 12  --   ALKPHOS 80  --   BILITOT 1.0  --   PROT 6.7   --   ALBUMIN 2.8* 3.0*   No results for input(s): LIPASE, AMYLASE in the last 168 hours. No results for input(s): AMMONIA in the last 168 hours.  CBC: Recent Labs  Lab 09/26/17 0502 09/26/17 1915 09/27/17 0447 09/28/17 0309 10/01/17 1154  WBC 7.0  --  11.6* 9.2 11.3*  HGB 6.1* 11.7* 10.9* 10.5* 10.9*  HCT 18.2* 34.5* 31.4* 30.8* 31.0*  MCV 90.3  --  88.0 88.5 87.5  PLT 194  --  269 249 267    Cardiac Enzymes: Recent Labs  Lab 09/26/17 0942 09/26/17 1550 09/27/17 1004 09/27/17 1412 09/27/17 2019  TROPONINI 0.04* 0.03* 0.03* <0.03 <0.03    BNP: Invalid input(s): POCBNP  CBG: Recent Labs  Lab 09/30/17 1152 09/30/17 1646 09/30/17 2104 10/01/17 0754 10/01/17 1143  GLUCAP 146* 243* 312* 306* 275*    Microbiology: Results for orders placed or performed during the hospital encounter of 09/21/17  Urine culture     Status: None   Collection Time: 09/21/17  2:31 PM  Result Value Ref Range Status  Specimen Description   Final    URINE, RANDOM Performed at [facility], [address], [place], [place] [address]    Special Requests   Final    NONE Performed at [facility], [address] [place], [place] [address]    Culture   Final    NO GROWTH Performed at [facility], [place] [address] [place], [place] [address]    Report Status 09/22/2017 FINAL  Final  Blood culture (routine x 2)     Status: None   Collection Time: 09/21/17  2:50 PM  Result Value Ref Range Status   Specimen Description BLOOD Blood Culture adequate volume  Final   Special Requests   Final    BOTTLES DRAWN AEROBIC AND ANAEROBIC BLOOD RIGHT ARM   Culture   Final    NO GROWTH 5 DAYS Performed at [facility], [address] [place], [place] [address]    Report Status 09/26/2017 FINAL  Final  Blood culture (routine x 2)     Status: None   Collection Time: 09/21/17  2:50 PM  Result Value Ref Range Status   Specimen Description BLOOD BLOOD LEFT HAND   Final   Special Requests BOTTLES DRAWN AEROBIC AND ANAEROBIC  Final   Culture   Final    NO GROWTH 5 DAYS Performed at [facility], [address], [place], [place] [address]    Report Status 09/26/2017 FINAL  Final  MRSA PCR Screening     Status: None   Collection Time: 09/22/17  7:22 AM  Result Value Ref Range Status   MRSA by PCR NEGATIVE NEGATIVE Final    Comment:        The GeneXpert MRSA Assay (FDA approved for NASAL specimens only), is one component of a comprehensive MRSA colonization surveillance program. It is not intended to diagnose MRSA infection nor to guide or monitor treatment for MRSA infections. Performed at [facility], [address] [place], [place] [address]     Coagulation Studies: No results for input(s): LABPROT, INR in the last 72 hours.  Urinalysis: No results for input(s): COLORURINE, LABSPEC, PHURINE, GLUCOSEU, HGBUR, BILIRUBINUR, KETONESUR, PROTEINUR, UROBILINOGEN, NITRITE, LEUKOCYTESUR in the last 72 hours.  Invalid input(s): APPERANCEUR    Imaging: No results found.   Medications:    . amiodarone  400 mg Oral BID  . Chlorhexidine Gluconate Cloth  6 each Topical Q0600  . digoxin  0.0625 mg Oral Daily  . erythromycin   Both Eyes Q6H  . famotidine  20 mg Oral Daily  . folic acid  1 mg Oral Daily  . heparin injection (subcutaneous)  5,000 Units Subcutaneous Q8H  . insulin aspart  0-15 Units Subcutaneous TID AC & HS  . insulin aspart  10 Units Subcutaneous TID WC  . insulin glargine  33 Units Subcutaneous Daily  . loratadine  10 mg Oral Daily  . metoprolol tartrate  25 mg Oral BID  . multivitamin with minerals  1 tablet Oral Daily  . protein supplement shake  11 oz Oral BID BM  . rosuvastatin  5 mg Oral q1800  . tamsulosin  0.4 mg Oral QPC supper  . vitamin C  250 mg Oral BID   acetaminophen **OR** acetaminophen  Assessment/ Plan:  Jordan [name] is a 67 y.o. white male with diabetes mellitus type II  insulin dependent, atrial fibrillation, CVA in 2014 with residual right-sided weakness and Aphasia, history of colon cancer with partial colectomy  1.  Acute renal failure with metabolic acidosis and obstructive uropathy: requiring hemodialysis during this admission Baseline creatinine of 1.1, GFR >60 on 05/29/17 Acute renal failure secondary to obstructive uropathy. Status post urostomy tubes bilaterally on 8/18 by Dr. Anselm Pancoast, radiology.  Nonoliguric urine output Creatinine trending downward.  - Appreciate urology input.   2.  Hypernatremia: free water deficit. Now improved to 138  3. Hypertension:  - metoprolol  Follow up appointment 9/20 at 11:40 with Dr. Holley Raring.    LOS: 10 Roshni Burbano 8/27/201912:17 PM

## 2017-10-01 NOTE — Discharge Instructions (Signed)
Follow-up with primary care physician at the [facility] 2 days Follow-up with Dr. Zollie Scale, nephrology in 1 week Follow-up with Dr. Erlene Quan urology in 1 week Follow-up with Dr. Humphrey Rolls cardiology on Friday, August 30 at 10 AM

## 2017-10-01 NOTE — Telephone Encounter (Signed)
-----   Message from Hollice Espy, MD sent at 09/30/2017  6:01 PM EDT ----- Regarding: cysto This patient needs an appointment for cystoscopy with any physician next week.  He is currently an inpatient and will be discharged to rehab in the near future  Hollice Espy, MD

## 2017-10-01 NOTE — Clinical Social Work Note (Signed)
Patient is medically ready for discharge today. CSW notified patient of discharge to [facility] today. CSW also notified Otila Kluver at [facility] of patient discharge. Otila Kluver states that she has obtained [facility] authorization. Patient will be transported by EMS. RN will call report and call for transport.   [place], [place]

## 2017-10-01 NOTE — Progress Notes (Signed)
Patient discharged Via EMS and stretcher.

## 2017-10-09 ENCOUNTER — Encounter: Payer: Self-pay | Admitting: Urology

## 2017-10-09 ENCOUNTER — Ambulatory Visit: Payer: Medicare Other | Admitting: Urology

## 2017-10-09 VITALS — BP 136/84 | HR 97 | Ht 69.0 in | Wt 170.0 lb

## 2017-10-09 DIAGNOSIS — N1330 Unspecified hydronephrosis: Secondary | ICD-10-CM | POA: Diagnosis not present

## 2017-10-09 DIAGNOSIS — N3289 Other specified disorders of bladder: Secondary | ICD-10-CM

## 2017-10-09 LAB — URINALYSIS, COMPLETE
Bilirubin, UA: NEGATIVE
Glucose, UA: NEGATIVE
KETONES UA: NEGATIVE
Nitrite, UA: NEGATIVE
PH UA: 6 (ref 5.0–7.5)
SPEC GRAV UA: 1.02 (ref 1.005–1.030)
Urobilinogen, Ur: 0.2 mg/dL (ref 0.2–1.0)

## 2017-10-09 LAB — MICROSCOPIC EXAMINATION
EPITHELIAL CELLS (NON RENAL): NONE SEEN /HPF (ref 0–10)
RBC, UA: >30 /hpf — ABNORMAL HIGH (ref 0–2)
WBC, UA: >30 /hpf — ABNORMAL HIGH (ref 0–5)

## 2017-10-09 NOTE — H&P (View-Only) (Signed)
   10/09/17  CC:  Chief Complaint  Patient presents with  . Cysto    HPI: 67 year old male recently discharged from the hospital admitted with altered mental status found to be in florid renal failure with a creatinine of 14.9 and severe leukocytosis.  Is also noted to have bilateral hydroureteronephrosis and concern for possible underlying bladder mass status post bilateral percutaneous nephrostomy tube placement.  He ultimately recovered and was discharged to [facility]hab.  Since discharge, his nephrostomy tubes are draining well.  He is slowly regaining strength and appetite.  He still in a wheelchair with limited mobility.  He has multiple medical comorbidities.  He is accompanied today by his sister.  CT abdomen without contrast on 09/22/2017 shows symmetric bilateral mild hydroureteronephrosis down to level of the bladder despite Foley catheter in place with increased density within the bladder as well as within the left Elvis.  No appreciable lymphadenopathy was noted.  PSA in the hospital 3.96.  Blood pressure 136/84, pulse 97, height 5\' 9"  (1.753 m), weight 170 lb (77.1 kg). NED. A&Ox3.   No respiratory distress   Abd soft, NT, ND Normal phallus with bilateral descended testicles Slightly decreased sphincter tone, 50+ cc prostate, diffusely firm but no obvious nodules.   Cystoscopy Procedure Note  Patient identification was confirmed, informed consent was obtained, and patient was prepped using Betadine solution.  Lidocaine jelly was administered per urethral meatus.    Preoperative abx where received prior to procedure.     Pre-Procedure: - Inspection reveals a normal caliber ureteral meatus.  Procedure: The flexible cystoscope was introduced without difficulty - No urethral strictures/lesions are present. - Enlarged prostate  - Normal bladder neck -Visualization limited by debris, irrigated several times until somewhat clear but visualization remains  suboptimal -Bullous edematous changes on the posterior bladder wall extended down toward the trigone with a somewhat nodular appearance concerning for an underlying tumor.  No obvious papillary tumors identified. - No bladder stones  Post-Procedure:  - Patient tolerated the procedure well  Assessment/ Plan:  1. Bladder mass Cystoscopic findings were discussed with the patient and his sister today Findings were concerning for an underlying lying infiltrative bladder lesion occupying trigone leading to bilateral ureteral obstruction although there is also component of bullous edema likely from previous Foley catheter I recommended proceeding to the operating room for cystoscopy, TURBT and possible bilateral retrograde pyelogram if the UOs were identified, possible conversion to indwelling ureteral stents which would be his preference with possible need for antegrade nephrostogram to help me find his UO.  He will need to be off anticoagulation for this procedure and will reach out to his cardiologist to obtain clearance for this.  We discussed the risk of surgery including risk of bleeding, infection, bladder perforation, damage to surrounding structures amongst others.  We discussed that we may or may not be able to comfort his nephrostomy tubes to indwelling ureteral stents.  We also discussed that the bladder lesion appears to be extensive, we may only opt for diagnostic TUR rather than complete resection pending intraoperative findings.  He is agreeable this plan.  Preop urine culture.  2. Hydronephrosis, unspecified hydronephrosis type Managed with bilateral indwelling ureteral stents Discussed findings today concerning for underlying pelvic malignancy is underlying etiology - Urinalysis, Complete - CULTURE, URINE COMPREHENSIVE  Hollice Espy, MD

## 2017-10-09 NOTE — Progress Notes (Signed)
   10/09/17  CC:  Chief Complaint  Patient presents with  . Cysto    HPI: 67 year old male recently discharged from the hospital admitted with altered mental status found to be in florid renal failure with a creatinine of 14.9 and severe leukocytosis.  Is also noted to have bilateral hydroureteronephrosis and concern for possible underlying bladder mass status post bilateral percutaneous nephrostomy tube placement.  He ultimately recovered and was discharged to [facility].  Since discharge, his nephrostomy tubes are draining well.  He is slowly regaining strength and appetite.  He still in a wheelchair with limited mobility.  He has multiple medical comorbidities.  He is accompanied today by his sister.  CT abdomen without contrast on 09/22/2017 shows symmetric bilateral mild hydroureteronephrosis down to level of the bladder despite Foley catheter in place with increased density within the bladder as well as within the left Elvis.  No appreciable lymphadenopathy was noted.  PSA in the hospital 3.96.  Blood pressure 136/84, pulse 97, height 5\' 9"  (1.753 m), weight 170 lb (77.1 kg). NED. A&Ox3.   No respiratory distress   Abd soft, NT, ND Normal phallus with bilateral descended testicles Slightly decreased sphincter tone, 50+ cc prostate, diffusely firm but no obvious nodules.   Cystoscopy Procedure Note  Patient identification was confirmed, informed consent was obtained, and patient was prepped using Betadine solution.  Lidocaine jelly was administered per urethral meatus.    Preoperative abx where received prior to procedure.     Pre-Procedure: - Inspection reveals a normal caliber ureteral meatus.  Procedure: The flexible cystoscope was introduced without difficulty - No urethral strictures/lesions are present. - Enlarged prostate  - Normal bladder neck -Visualization limited by debris, irrigated several times until somewhat clear but visualization remains  suboptimal -Bullous edematous changes on the posterior bladder wall extended down toward the trigone with a somewhat nodular appearance concerning for an underlying tumor.  No obvious papillary tumors identified. - No bladder stones  Post-Procedure:  - Patient tolerated the procedure well  Assessment/ Plan:  1. Bladder mass Cystoscopic findings were discussed with the patient and his sister today Findings were concerning for an underlying lying infiltrative bladder lesion occupying trigone leading to bilateral ureteral obstruction although there is also component of bullous edema likely from previous Foley catheter I recommended proceeding to the operating room for cystoscopy, TURBT and possible bilateral retrograde pyelogram if the UOs were identified, possible conversion to indwelling ureteral stents which would be his preference with possible need for antegrade nephrostogram to help me find his UO.  He will need to be off anticoagulation for this procedure and will reach out to his cardiologist to obtain clearance for this.  We discussed the risk of surgery including risk of bleeding, infection, bladder perforation, damage to surrounding structures amongst others.  We discussed that we may or may not be able to comfort his nephrostomy tubes to indwelling ureteral stents.  We also discussed that the bladder lesion appears to be extensive, we may only opt for diagnostic TUR rather than complete resection pending intraoperative findings.  He is agreeable this plan.  Preop urine culture.  2. Hydronephrosis, unspecified hydronephrosis type Managed with bilateral indwelling ureteral stents Discussed findings today concerning for underlying pelvic malignancy is underlying etiology - Urinalysis, Complete - CULTURE, URINE COMPREHENSIVE  Hollice Espy, MD

## 2017-10-10 ENCOUNTER — Other Ambulatory Visit: Payer: Self-pay | Admitting: Radiology

## 2017-10-10 DIAGNOSIS — N3289 Other specified disorders of bladder: Secondary | ICD-10-CM

## 2017-10-13 LAB — CULTURE, URINE COMPREHENSIVE

## 2017-10-14 ENCOUNTER — Other Ambulatory Visit: Payer: Self-pay | Admitting: Radiology

## 2017-10-14 ENCOUNTER — Telehealth: Payer: Self-pay | Admitting: Radiology

## 2017-10-14 DIAGNOSIS — N1330 Unspecified hydronephrosis: Secondary | ICD-10-CM

## 2017-10-14 DIAGNOSIS — N3289 Other specified disorders of bladder: Secondary | ICD-10-CM

## 2017-10-14 NOTE — Telephone Encounter (Signed)
-----   Message from Hollice Espy, MD sent at 10/13/2017  2:13 PM EDT ----- UCx consistent with skin contamination rather than true infection.  No need to treat.  I would like to give him a gram of vancomycin as a precaution perioperatively in addition to his previously ordered preop antibiotics.  Hollice Espy, MD

## 2017-10-14 NOTE — Telephone Encounter (Signed)
Vancomycin 1g IV added to pre-op antibiotics per Dr Erlene Quan. See note below.

## 2017-10-15 ENCOUNTER — Telehealth: Payer: Self-pay | Admitting: Radiology

## 2017-10-15 NOTE — Telephone Encounter (Signed)
Notified patient's sister, Basilia Jumbo, of surgery scheduled with Dr Erlene Quan on 10/23/2017, pre-admit testing appointment on 10/17/2017 at 8:00 & to hold Xarelto beginning 10/20/2017. Questions answered. Sister voices understanding.   Also faxed information to Elmyra Ricks at [facility] (phone # [phone]) to fax # [phone].

## 2017-10-16 ENCOUNTER — Telehealth: Payer: Self-pay | Admitting: Urology

## 2017-10-16 NOTE — Telephone Encounter (Signed)
Insurance Authorization for Outpatient Surgery CPT 52240 - TURBT CPT [id number] - Bilateral retrograde pyelogram CPT 93734 - possible stent placement CPT 50431 - antegrade nephrostagram DOS: 10/23/2017 Dr. Hollice Espy  Per [facility] Medicare [phone]), spoke to [facility], prior authorization is NOT required.  Reference # [id number]  [place] 10/16/2017 10:29am

## 2017-10-17 ENCOUNTER — Other Ambulatory Visit: Payer: Self-pay

## 2017-10-17 ENCOUNTER — Encounter
Admission: RE | Admit: 2017-10-17 | Discharge: 2017-10-17 | Disposition: A | Discharge status: Home / Self Care | Payer: Medicare Other | Source: Ambulatory Visit | Attending: Urology | Admitting: Urology

## 2017-10-17 DIAGNOSIS — Z01812 Encounter for preprocedural laboratory examination: Secondary | ICD-10-CM | POA: Insufficient documentation

## 2017-10-17 DIAGNOSIS — I4891 Unspecified atrial fibrillation: Secondary | ICD-10-CM | POA: Diagnosis not present

## 2017-10-17 LAB — CBC
HCT: 28.9 % — ABNORMAL LOW (ref 40.0–52.0)
Hemoglobin: 10.2 g/dL — ABNORMAL LOW (ref 13.0–18.0)
MCH: 30.9 pg (ref 26.0–34.0)
MCHC: 35.4 g/dL (ref 32.0–36.0)
MCV: 87.5 fL (ref 80.0–100.0)
PLATELETS: 413 10*3/uL (ref 150–440)
RBC: 3.3 MIL/uL — AB (ref 4.40–5.90)
RDW: 14.4 % (ref 11.5–14.5)
WBC: 12.2 10*3/uL — ABNORMAL HIGH (ref 3.8–10.6)

## 2017-10-17 LAB — URINALYSIS, ROUTINE W REFLEX MICROSCOPIC
BILIRUBIN URINE: NEGATIVE
Glucose, UA: 150 mg/dL — AB
KETONES UR: NEGATIVE mg/dL
NITRITE: NEGATIVE
PH: 5 (ref 5.0–8.0)
Protein, ur: 30 mg/dL — AB
Specific Gravity, Urine: 1.01 (ref 1.005–1.030)
Squamous Epithelial / LPF: NONE SEEN (ref 0–5)

## 2017-10-17 LAB — BASIC METABOLIC PANEL
Anion gap: 12 (ref 5–15)
BUN: 31 mg/dL — ABNORMAL HIGH (ref 8–23)
CHLORIDE: 97 mmol/L — AB (ref 98–111)
CO2: 20 mmol/L — AB (ref 22–32)
CREATININE: 2.13 mg/dL — AB (ref 0.61–1.24)
Calcium: 9.2 mg/dL (ref 8.9–10.3)
GFR calc Af Amer: 35 mL/min — ABNORMAL LOW (ref >60)
GFR calc non Af Amer: 31 mL/min — ABNORMAL LOW (ref >60)
Glucose, Bld: 221 mg/dL — ABNORMAL HIGH (ref 70–99)
Potassium: 4.3 mmol/L (ref 3.5–5.1)
Sodium: 129 mmol/L — ABNORMAL LOW (ref 135–145)

## 2017-10-17 LAB — PROTIME-INR
INR: 1.31
Prothrombin Time: 16.2 seconds — ABNORMAL HIGH (ref 11.4–15.2)

## 2017-10-17 NOTE — Patient Instructions (Signed)
Your procedure is scheduled on: Wed. 9/18 Report to Day Surgery. 2nd [place] To find out your arrival time please call [phone] between 1PM - 3PM on Tues.9/17  Remember: Instructions that are not followed completely may result in serious medical risk,  up to and including death, or upon the discretion of your surgeon and anesthesiologist your  surgery may need to be rescheduled.     _X__ 1. Do not eat food after midnight the night before your procedure.                 No gum chewing or hard candies. You may drink clear liquids up to 2 hours                 before you are scheduled to arrive for your surgery- DO not drink clear                 liquids within 2 hours of the start of your surgery.                 Clear Liquids include:  water,  __X__2.  On the morning of surgery brush your teeth with toothpaste and water, you                may rinse your mouth with mouthwash if you wish.  Do not swallow any toothpaste of mouthwash.     ___ 3.  No Alcohol for 24 hours before or after surgery.   ___ 4.  Do Not Smoke or use e-cigarettes For 24 Hours Prior to Your Surgery.                 Do not use any chewable tobacco products for at least 6 hours prior to                 surgery.  ____  5.  Bring all medications with you on the day of surgery if instructed.   ___x_  6.  Notify your doctor if there is any change in your medical condition      (cold, fever, infections).     Do not wear jewelry, make-up, hairpins, clips or nail polish. Do not wear lotions, powders, or perfumes. You may wear deodorant. Do not shave 48 hours prior to surgery. Men may shave face and neck. Do not bring valuables to the hospital.    [facility] is not responsible for any belongings or valuables.  Contacts, dentures or bridgework may not be worn into surgery. Leave your suitcase in the car. After surgery it may be brought to your room. For patients admitted to the  hospital, discharge time is determined by your treatment team.   Patients discharged the day of surgery will not be allowed to drive home.   Please read over the following fact sheets that you were given:    __x__ Take these medicines the morning of surgery with A SIP OF WATER:    1. acetaminophen (TYLENOL) 325 MG tablet if needed  2. amiodarone (PACERONE) 200 MG tablet  3. digoxin 62.5 MCG TABS  4.famotidine (PEPCID) 20 MG tablet  Give extra dose the night before surgery and regular dose the morning of surgery  5.loratadine (CLARITIN) 10 MG tablet  6.metoprolol tartrate (LOPRESSOR) 25 MG tablet  ____ Fleet Enema (as directed)   _x___ Use CHG Wipes morning of surgery per instructions  ____ Use inhalers on the day of surgery  ____ Stop metformin 2 days prior to  surgery    __x__ Take 1/2 of usual insulin dose the night before surgery. No insulin the morning   insulin glargine (LANTUS) 100 UNIT/ML injection 15 units only          of surgery.   _x___ Stop Rivaroxaban (XARELTO) 15 MG TABS tablet  on 9/15  ____ Stop Anti-inflammatories on    ___x_ Stop supplements until after surgery. vitamin C (VITAMIN C) 250 MG tablet   ____ Bring C-Pap to the hospital.

## 2017-10-19 LAB — URINE CULTURE

## 2017-10-21 ENCOUNTER — Telehealth: Payer: Self-pay | Admitting: Radiology

## 2017-10-21 NOTE — Telephone Encounter (Signed)
Jordan [name] confirmed receipt of orders for Keflex & states patient has been afebrile & is asymptomatic at this time.

## 2017-10-21 NOTE — Telephone Encounter (Signed)
Notified Elmyra Ricks of orders for antibiotics prior to surgery. Also faxed to [phone].

## 2017-10-21 NOTE — Telephone Encounter (Signed)
-----   Message from Hollice Espy, MD sent at 10/21/2017 10:52 AM EDT ----- Arloa Koh go ahead and try to get him several doses today and tomorrow.  As long as he is afebrile and otherwise asymptomatic, this will help reduce his bacterial load in time for Wednesday.  If he spikes a fever, will have to cancel reschedule.

## 2017-10-21 NOTE — Telephone Encounter (Signed)
-----   Message from Hollice Espy, MD sent at 10/20/2017  4:23 PM EDT ----- Please treat with Keflex 500 mg qid x 7 days,  to be completed on day of surgery.   Hollice Espy, MD

## 2017-10-22 MED ORDER — VANCOMYCIN HCL IN DEXTROSE 1-5 GM/200ML-% IV SOLN
1000.0000 mg | INTRAVENOUS | Status: AC
  Administered 2017-10-23: 1000 mg via INTRAVENOUS

## 2017-10-22 MED ORDER — CEFAZOLIN SODIUM-DEXTROSE 2-4 GM/100ML-% IV SOLN
2.0000 g | INTRAVENOUS | Status: AC
  Administered 2017-10-23: 2 g via INTRAVENOUS

## 2017-10-23 ENCOUNTER — Other Ambulatory Visit: Payer: Self-pay | Admitting: Radiology

## 2017-10-23 ENCOUNTER — Encounter: Payer: Self-pay | Admitting: *Deleted

## 2017-10-23 ENCOUNTER — Ambulatory Visit: Payer: Medicare Other | Admitting: Anesthesiology

## 2017-10-23 ENCOUNTER — Encounter
Admission: RE | Disposition: A | Discharge status: Home / Self Care | Payer: Self-pay | Source: Ambulatory Visit | Attending: Urology

## 2017-10-23 ENCOUNTER — Ambulatory Visit
Admission: RE | Admit: 2017-10-23 | Discharge: 2017-10-23 | Disposition: A | Discharge status: Home / Self Care | Payer: Medicare Other | Source: Ambulatory Visit | Attending: Urology | Admitting: Urology

## 2017-10-23 ENCOUNTER — Other Ambulatory Visit: Payer: Self-pay

## 2017-10-23 DIAGNOSIS — I69351 Hemiplegia and hemiparesis following cerebral infarction affecting right dominant side: Secondary | ICD-10-CM | POA: Insufficient documentation

## 2017-10-23 DIAGNOSIS — N179 Acute kidney failure, unspecified: Secondary | ICD-10-CM

## 2017-10-23 DIAGNOSIS — T83512A Infection and inflammatory reaction due to nephrostomy catheter, initial encounter: Secondary | ICD-10-CM | POA: Diagnosis not present

## 2017-10-23 DIAGNOSIS — N1330 Unspecified hydronephrosis: Secondary | ICD-10-CM

## 2017-10-23 DIAGNOSIS — E118 Type 2 diabetes mellitus with unspecified complications: Secondary | ICD-10-CM

## 2017-10-23 DIAGNOSIS — N131 Hydronephrosis with ureteral stricture, not elsewhere classified: Secondary | ICD-10-CM

## 2017-10-23 DIAGNOSIS — R4182 Altered mental status, unspecified: Secondary | ICD-10-CM

## 2017-10-23 DIAGNOSIS — I10 Essential (primary) hypertension: Secondary | ICD-10-CM

## 2017-10-23 DIAGNOSIS — D72829 Elevated white blood cell count, unspecified: Secondary | ICD-10-CM

## 2017-10-23 DIAGNOSIS — R531 Weakness: Secondary | ICD-10-CM | POA: Diagnosis not present

## 2017-10-23 DIAGNOSIS — N3289 Other specified disorders of bladder: Secondary | ICD-10-CM

## 2017-10-23 DIAGNOSIS — C679 Malignant neoplasm of bladder, unspecified: Secondary | ICD-10-CM | POA: Insufficient documentation

## 2017-10-23 DIAGNOSIS — Z794 Long term (current) use of insulin: Secondary | ICD-10-CM | POA: Insufficient documentation

## 2017-10-23 DIAGNOSIS — N400 Enlarged prostate without lower urinary tract symptoms: Secondary | ICD-10-CM

## 2017-10-23 DIAGNOSIS — D494 Neoplasm of unspecified behavior of bladder: Secondary | ICD-10-CM | POA: Diagnosis not present

## 2017-10-23 HISTORY — PX: CYSTOSCOPY WITH STENT PLACEMENT: SHX5790

## 2017-10-23 HISTORY — PX: CYSTOSCOPY WITH RETROGRADE URETHROGRAM: SHX6309

## 2017-10-23 HISTORY — PX: TRANSURETHRAL RESECTION OF BLADDER TUMOR: SHX2575

## 2017-10-23 HISTORY — PX: CYSTOSCOPY W/ RETROGRADES: SHX1426

## 2017-10-23 LAB — GLUCOSE, CAPILLARY
GLUCOSE-CAPILLARY: 153 mg/dL — AB (ref 70–99)
GLUCOSE-CAPILLARY: 278 mg/dL — AB (ref 70–99)
GLUCOSE-CAPILLARY: 279 mg/dL — AB (ref 70–99)
Glucose-Capillary: 260 mg/dL — ABNORMAL HIGH (ref 70–99)

## 2017-10-23 LAB — POCT I-STAT 4, (NA,K, GLUC, HGB,HCT)
GLUCOSE: 173 mg/dL — AB (ref 70–99)
HEMATOCRIT: 29 % — AB (ref 39.0–52.0)
Hemoglobin: 9.9 g/dL — ABNORMAL LOW (ref 13.0–17.0)
POTASSIUM: 4.2 mmol/L (ref 3.5–5.1)
SODIUM: 134 mmol/L — AB (ref 135–145)

## 2017-10-23 LAB — PROTIME-INR
INR: 1.28
Prothrombin Time: 15.9 seconds — ABNORMAL HIGH (ref 11.4–15.2)

## 2017-10-23 SURGERY — TURBT (TRANSURETHRAL RESECTION OF BLADDER TUMOR)
Anesthesia: General | Site: Ureter

## 2017-10-23 MED ORDER — INSULIN ASPART 100 UNIT/ML ~~LOC~~ SOLN
SUBCUTANEOUS | Status: AC
  Filled 2017-10-23: qty 1

## 2017-10-23 MED ORDER — FENTANYL CITRATE (PF) 100 MCG/2ML IJ SOLN
25.0000 ug | INTRAMUSCULAR | Status: AC | PRN
  Administered 2017-10-23 (×6): 25 ug via INTRAVENOUS

## 2017-10-23 MED ORDER — PHENYLEPHRINE HCL 10 MG/ML IJ SOLN
INTRAMUSCULAR | Status: DC | PRN
  Administered 2017-10-23: 100 ug via INTRAVENOUS

## 2017-10-23 MED ORDER — SODIUM CHLORIDE 0.9 % IV SOLN
INTRAVENOUS | Status: DC
  Administered 2017-10-23: 12:00:00 via INTRAVENOUS

## 2017-10-23 MED ORDER — MIDAZOLAM HCL 2 MG/2ML IJ SOLN
INTRAMUSCULAR | Status: AC
  Filled 2017-10-23: qty 2

## 2017-10-23 MED ORDER — MIDAZOLAM HCL 5 MG/5ML IJ SOLN
INTRAMUSCULAR | Status: DC | PRN
  Administered 2017-10-23: 1 mg via INTRAVENOUS

## 2017-10-23 MED ORDER — METOPROLOL TARTRATE 5 MG/5ML IV SOLN
INTRAVENOUS | Status: DC | PRN
  Administered 2017-10-23 (×2): 1 mg via INTRAVENOUS

## 2017-10-23 MED ORDER — BELLADONNA ALKALOIDS-OPIUM 16.2-60 MG RE SUPP
RECTAL | Status: AC
  Filled 2017-10-23: qty 1

## 2017-10-23 MED ORDER — DEXAMETHASONE SODIUM PHOSPHATE 10 MG/ML IJ SOLN
INTRAMUSCULAR | Status: AC
  Filled 2017-10-23: qty 1

## 2017-10-23 MED ORDER — DOCUSATE SODIUM 100 MG PO CAPS: 100.0000 mg | ORAL_CAPSULE | Freq: Two times a day (BID) | ORAL | 0 refills | Status: AC

## 2017-10-23 MED ORDER — FENTANYL CITRATE (PF) 100 MCG/2ML IJ SOLN
INTRAMUSCULAR | Status: DC | PRN
  Administered 2017-10-23: 50 ug via INTRAVENOUS
  Administered 2017-10-23: 100 ug via INTRAVENOUS
  Administered 2017-10-23: 50 ug via INTRAVENOUS

## 2017-10-23 MED ORDER — HYDROCODONE-ACETAMINOPHEN 5-325 MG PO TABS: 1.0000 | ORAL_TABLET | Freq: Four times a day (QID) | ORAL | 0 refills | Status: DC | PRN

## 2017-10-23 MED ORDER — INSULIN ASPART 100 UNIT/ML ~~LOC~~ SOLN
SUBCUTANEOUS | Status: AC
  Administered 2017-10-23: 5 [IU] via SUBCUTANEOUS
  Filled 2017-10-23: qty 1

## 2017-10-23 MED ORDER — SUGAMMADEX SODIUM 200 MG/2ML IV SOLN
INTRAVENOUS | Status: DC | PRN
  Administered 2017-10-23: 150 mg via INTRAVENOUS

## 2017-10-23 MED ORDER — SUCCINYLCHOLINE CHLORIDE 20 MG/ML IJ SOLN
INTRAMUSCULAR | Status: DC | PRN
  Administered 2017-10-23: 100 mg via INTRAVENOUS

## 2017-10-23 MED ORDER — FENTANYL CITRATE (PF) 100 MCG/2ML IJ SOLN
INTRAMUSCULAR | Status: AC
  Filled 2017-10-23: qty 2

## 2017-10-23 MED ORDER — LIDOCAINE HCL (PF) 2 % IJ SOLN
INTRAMUSCULAR | Status: AC
  Filled 2017-10-23: qty 10

## 2017-10-23 MED ORDER — IOPAMIDOL (ISOVUE-200) INJECTION 41%
INTRAVENOUS | Status: DC | PRN
  Administered 2017-10-23: 40 mL

## 2017-10-23 MED ORDER — ONDANSETRON HCL 4 MG/2ML IJ SOLN: 4.0000 mg | Freq: Once | INTRAMUSCULAR | Status: DC | PRN

## 2017-10-23 MED ORDER — LIDOCAINE HCL (CARDIAC) PF 100 MG/5ML IV SOSY
PREFILLED_SYRINGE | INTRAVENOUS | Status: DC | PRN
  Administered 2017-10-23: 60 mg via INTRAVENOUS

## 2017-10-23 MED ORDER — INSULIN ASPART 100 UNIT/ML ~~LOC~~ SOLN
5.0000 [IU] | Freq: Once | SUBCUTANEOUS | Status: AC
  Administered 2017-10-23: 5 [IU] via SUBCUTANEOUS

## 2017-10-23 MED ORDER — SUGAMMADEX SODIUM 200 MG/2ML IV SOLN
INTRAVENOUS | Status: AC
  Filled 2017-10-23: qty 2

## 2017-10-23 MED ORDER — BELLADONNA ALKALOIDS-OPIUM 16.2-60 MG RE SUPP
1.0000 | Freq: Every day | RECTAL | Status: DC
  Administered 2017-10-23: 1 via RECTAL

## 2017-10-23 MED ORDER — FENTANYL CITRATE (PF) 100 MCG/2ML IJ SOLN
INTRAMUSCULAR | Status: AC
  Administered 2017-10-23: 25 ug via INTRAVENOUS
  Filled 2017-10-23: qty 2

## 2017-10-23 MED ORDER — ROCURONIUM BROMIDE 100 MG/10ML IV SOLN
INTRAVENOUS | Status: DC | PRN
  Administered 2017-10-23: 5 mg via INTRAVENOUS
  Administered 2017-10-23: 25 mg via INTRAVENOUS
  Administered 2017-10-23: 10 mg via INTRAVENOUS
  Administered 2017-10-23: 20 mg via INTRAVENOUS

## 2017-10-23 MED ORDER — CEFAZOLIN SODIUM-DEXTROSE 2-4 GM/100ML-% IV SOLN
INTRAVENOUS | Status: AC
  Filled 2017-10-23: qty 100

## 2017-10-23 MED ORDER — ROCURONIUM BROMIDE 50 MG/5ML IV SOLN
INTRAVENOUS | Status: AC
  Filled 2017-10-23: qty 1

## 2017-10-23 MED ORDER — DEXAMETHASONE SODIUM PHOSPHATE 10 MG/ML IJ SOLN
INTRAMUSCULAR | Status: DC | PRN
  Administered 2017-10-23: 4 mg via INTRAVENOUS

## 2017-10-23 MED ORDER — PROPOFOL 10 MG/ML IV BOLUS
INTRAVENOUS | Status: AC
  Filled 2017-10-23: qty 20

## 2017-10-23 MED ORDER — VANCOMYCIN HCL IN DEXTROSE 1-5 GM/200ML-% IV SOLN
INTRAVENOUS | Status: AC
  Administered 2017-10-23: 1000 mg via INTRAVENOUS
  Filled 2017-10-23: qty 200

## 2017-10-23 MED ORDER — ONDANSETRON HCL 4 MG/2ML IJ SOLN
INTRAMUSCULAR | Status: DC | PRN
  Administered 2017-10-23: 4 mg via INTRAVENOUS

## 2017-10-23 MED ORDER — EPHEDRINE SULFATE 50 MG/ML IJ SOLN
INTRAMUSCULAR | Status: DC | PRN
  Administered 2017-10-23: 10 mg via INTRAVENOUS

## 2017-10-23 MED ORDER — OXYBUTYNIN CHLORIDE 5 MG PO TABS: 5.0000 mg | ORAL_TABLET | Freq: Three times a day (TID) | ORAL | 0 refills | Status: AC | PRN

## 2017-10-23 MED ORDER — PROPOFOL 10 MG/ML IV BOLUS
INTRAVENOUS | Status: DC | PRN
  Administered 2017-10-23: 130 mg via INTRAVENOUS

## 2017-10-23 MED ORDER — METHYLENE BLUE 0.5 % INJ SOLN
INTRAVENOUS | Status: AC
  Filled 2017-10-23: qty 10

## 2017-10-23 MED ORDER — ONDANSETRON HCL 4 MG/2ML IJ SOLN
INTRAMUSCULAR | Status: AC
  Filled 2017-10-23: qty 2

## 2017-10-23 SURGICAL SUPPLY — 41 items
BAG DRAIN CYSTO-URO LG1000N (MISCELLANEOUS) ×4 IMPLANT
BAG URINE DRAINAGE (UROLOGICAL SUPPLIES) IMPLANT
BAND RUBBER 3X1/6 TAN STRL (MISCELLANEOUS) IMPLANT
BRUSH SCRUB EZ  4% CHG (MISCELLANEOUS) ×2
BRUSH SCRUB EZ 1% IODOPHOR (MISCELLANEOUS) ×4 IMPLANT
BRUSH SCRUB EZ 4% CHG (MISCELLANEOUS) ×2 IMPLANT
CATH FOL 2WAY LX 22X30 (CATHETERS) ×4 IMPLANT
CATH FOLEY 2W COUNCIL 5CC 18FR (CATHETERS) ×4 IMPLANT
CATH FOLEY 2WAY  5CC 16FR (CATHETERS)
CATH URETL 5X70 OPEN END (CATHETERS) ×4 IMPLANT
CATH URTH 16FR FL 2W BLN LF (CATHETERS) IMPLANT
DRAPE UTILITY 15X26 TOWEL STRL (DRAPES) ×4 IMPLANT
DRSG TELFA 4X3 1S NADH ST (GAUZE/BANDAGES/DRESSINGS) ×4 IMPLANT
ELECT LOOP 22F BIPOLAR SML (ELECTROSURGICAL)
ELECT REM PT RETURN 9FT ADLT (ELECTROSURGICAL)
ELECTRODE LOOP 22F BIPOLAR SML (ELECTROSURGICAL) IMPLANT
ELECTRODE REM PT RTRN 9FT ADLT (ELECTROSURGICAL) IMPLANT
GLIDEWIRE STIFF .35X180X3 HYDR (WIRE) ×4 IMPLANT
GLOVE BIO SURGEON STRL SZ 6.5 (GLOVE) ×3 IMPLANT
GLOVE BIO SURGEONS STRL SZ 6.5 (GLOVE) ×1
GOWN STRL REUS W/ TWL LRG LVL3 (GOWN DISPOSABLE) ×4 IMPLANT
GOWN STRL REUS W/TWL LRG LVL3 (GOWN DISPOSABLE) ×4
KIT TURNOVER CYSTO (KITS) ×4 IMPLANT
LOOP CUT BIPOLAR 24F LRG (ELECTROSURGICAL) ×4 IMPLANT
NDL SAFETY ECLIPSE 18X1.5 (NEEDLE) ×2 IMPLANT
NEEDLE HYPO 18GX1.5 SHARP (NEEDLE) ×2
PACK CYSTO AR (MISCELLANEOUS) ×4 IMPLANT
SENSORWIRE 0.038 NOT ANGLED (WIRE) ×8
SET CYSTO W/LG BORE CLAMP LF (SET/KITS/TRAYS/PACK) ×4 IMPLANT
SET IRRIG Y TYPE TUR BLADDER L (SET/KITS/TRAYS/PACK) ×4 IMPLANT
SET IRRIGATING DISP (SET/KITS/TRAYS/PACK) IMPLANT
SOL .9 NS 3000ML IRR  AL (IV SOLUTION) ×20
SOL .9 NS 3000ML IRR UROMATIC (IV SOLUTION) ×20 IMPLANT
STENT URET 6FRX24 CONTOUR (STENTS) IMPLANT
STENT URET 6FRX26 CONTOUR (STENTS) IMPLANT
STENT URO INLAY 6FRX24CM (STENTS) IMPLANT
STENT URO INLAY 6FRX26CM (STENTS) ×8 IMPLANT
SURGILUBE 2OZ TUBE FLIPTOP (MISCELLANEOUS) ×4 IMPLANT
SYRINGE IRR TOOMEY STRL 70CC (SYRINGE) ×4 IMPLANT
WATER STERILE IRR 1000ML POUR (IV SOLUTION) ×4 IMPLANT
WIRE SENSOR 0.038 NOT ANGLED (WIRE) ×4 IMPLANT

## 2017-10-23 NOTE — Interval H&P Note (Signed)
History and Physical Interval Note:  10/23/2017 12:12 PM  Jordan [name]  has presented today for surgery, with the diagnosis of bladder mass  The various methods of treatment have been discussed with the patient and family. After consideration of risks, benefits and other options for treatment, the patient has consented to  Procedure(s): TRANSURETHRAL RESECTION OF BLADDER TUMOR (TURBT) (N/A) CYSTOSCOPY WITH RETROGRADE PYELOGRAM (Bilateral) CYSTOSCOPY WITH STENT PLACEMENT (Bilateral) CYSTOSCOPY WITH antegrade nephrostogram (Bilateral) as a surgical intervention .  The patient's history has been reviewed, patient examined, no change in status, stable for surgery.  I have reviewed the patient's chart and labs.  Questions were answered to the patient's satisfaction.    RRR CTAB  Previously on Macrobid, transitioned to Keflex over past 2 days for bacterial colonization of nephrostomy tubes.  Hollice Espy

## 2017-10-23 NOTE — Anesthesia Preprocedure Evaluation (Signed)
Anesthesia Evaluation  Patient identified by MRN, date of birth, ID band Patient confused    Reviewed: Allergy & Precautions, NPO status , Patient's Chart, lab work & pertinent test results  History of Anesthesia Complications Negative for: history of anesthetic complications  Airway Mallampati: II       Dental   Pulmonary neg sleep apnea, neg COPD, former smoker,           Cardiovascular hypertension, Pt. on medications (-) Past MI and (-) CHF + dysrhythmias Atrial Fibrillation (-) Valvular Problems/Murmurs     Neuro/Psych CVA (pt confused, R sided weakness), Residual Symptoms    GI/Hepatic Neg liver ROS, neg GERD  ,  Endo/Other  diabetes, Type 2, Insulin DependentHyperthyroidism   Renal/GU ARFRenal disease     Musculoskeletal   Abdominal   Peds  Hematology   Anesthesia Other Findings   Reproductive/Obstetrics                             Anesthesia Physical Anesthesia Plan  ASA: III  Anesthesia Plan: General   Post-op Pain Management:    Induction: Intravenous  PONV Risk Score and Plan: 2 and Dexamethasone and Ondansetron  Airway Management Planned: Oral ETT  Additional Equipment:   Intra-op Plan:   Post-operative Plan:   Informed Consent: I have reviewed the patients History and Physical, chart, labs and discussed the procedure including the risks, benefits and alternatives for the proposed anesthesia with the patient or authorized representative who has indicated his/her understanding and acceptance.     Plan Discussed with:   Anesthesia Plan Comments:         Anesthesia Quick Evaluation

## 2017-10-23 NOTE — Anesthesia Procedure Notes (Signed)
Procedure Name: Intubation Date/Time: 10/23/2017 12:50 PM Performed by: Allean Found, CRNA Pre-anesthesia Checklist: Patient identified, Patient being monitored, Timeout performed, Emergency Drugs available and Suction available Patient Re-evaluated:Patient Re-evaluated prior to induction Oxygen Delivery Method: Circle system utilized Preoxygenation: Pre-oxygenation with 100% oxygen Induction Type: IV induction Ventilation: Mask ventilation without difficulty Laryngoscope Size: 3 and McGraph Grade View: Grade I Tube type: Oral Tube size: 7.0 mm Number of attempts: 1 Airway Equipment and Method: Stylet Placement Confirmation: ETT inserted through vocal cords under direct vision,  positive ETCO2 and breath sounds checked- equal and bilateral Secured at: 21 cm Tube secured with: Tape Dental Injury: Teeth and Oropharynx as per pre-operative assessment

## 2017-10-23 NOTE — Discharge Instructions (Addendum)
AMBULATORY SURGERY  °DISCHARGE INSTRUCTIONS ° ° °1) The drugs that you were given will stay in your system until tomorrow so for the next 24 hours you should not: ° °A) Drive an automobile °B) Make any legal decisions °C) Drink any alcoholic beverage ° ° °2) You may resume regular meals tomorrow.  Today it is better to start with liquids and gradually work up to solid foods. ° °You may eat anything you prefer, but it is better to start with liquids, then soup and crackers, and gradually work up to solid foods. ° ° °3) Please notify your doctor immediately if you have any unusual bleeding, trouble breathing, redness and pain at the surgery site, drainage, fever, or pain not relieved by medication. ° ° ° °4) Additional Instructions: ° ° ° ° ° ° ° °Please contact your physician with any problems or Same Day Surgery at [phone], Monday through Friday 6 am to 4 pm, or [facility] number at [phone]. ° ° °Indwelling Urinary Catheter Care, Adult °Take good care of your catheter to keep it working and to prevent problems. °How to wear your catheter °Attach your catheter to your leg with tape (adhesive tape) or a leg strap. Make sure it is not too tight. If you use tape, remove any bits of tape that are already on the catheter. °How to wear a drainage bag °You should have: °· A large overnight bag. °· A small leg bag. ° °Overnight Bag °You may wear the overnight bag at any time. Always keep the bag below the level of your bladder but off the floor. When you sleep, put a clean plastic bag in a wastebasket. Then hang the bag inside the wastebasket. °Leg Bag °Never wear the leg bag at night. Always wear the leg bag below your knee. Keep the leg bag secure with a leg strap or tape. °How to care for your skin °· Clean the skin around the catheter at least once every day. °· Shower every day. Do not take baths. °· Put creams, lotions, or ointments on your genital area only as told by your doctor. °· Do not  use powders, sprays, or lotions on your genital area. °How to clean your catheter and your skin °1. Wash your hands with soap and water. °2. Wet a washcloth in warm water and gentle (mild) soap. °3. Use the washcloth to clean the skin where the catheter enters your body. Clean downward and wipe away from the catheter in small circles. Do not wipe toward the catheter. °4. Pat the area dry with a clean towel. Make sure to clean off all soap. °How to care for your drainage bags °Empty your drainage bag when it is ?-½ full or at least 2-3 times a day. Replace your drainage bag once a month or sooner if it starts to smell bad or look dirty. Do not clean your drainage bag unless told by your doctor. °Emptying a drainage bag ° °Supplies Needed °· Rubbing alcohol. °· Gauze pad or cotton ball. °· Tape or a leg strap. ° °Steps °1. Wash your hands with soap and water. °2. Separate (detach) the bag from your leg. °3. Hold the bag over the toilet or a clean container. Keep the bag below your hips and bladder. This stops pee (urine) from going back into the tube. °4. Open the pour spout at the bottom of the bag. °5. Empty the pee into the toilet or container. Do not let the pour spout touch   any surface. °6. Put rubbing alcohol on a gauze pad or cotton ball. °7. Use the gauze pad or cotton ball to clean the pour spout. °8. Close the pour spout. °9. Attach the bag to your leg with tape or a leg strap. °10. Wash your hands. ° °Changing a drainage bag °Supplies Needed °· Alcohol wipes. °· A clean drainage bag. °· Adhesive tape or a leg strap. ° °Steps °1. Wash your hands with soap and water. °2. Separate the dirty bag from your leg. °3. Pinch the rubber catheter with your fingers so that pee does not spill out. °4. Separate the catheter tube from the drainage tube where these tubes connect (at the connection valve). Do not let the tubes touch any surface. °5. Clean the end of the catheter tube with an alcohol wipe. Use a different  alcohol wipe to clean the end of the drainage tube. °6. Connect the catheter tube to the drainage tube of the clean bag. °7. Attach the new bag to the leg with adhesive tape or a leg strap. °8. Wash your hands. ° °How to prevent infection and other problems °· Never pull on your catheter or try to remove it. Pulling can damage tissue in your body. °· Always wash your hands before and after touching your catheter. °· If a leg strap gets wet, replace it with a dry one. °· Drink enough fluids to keep your pee clear or pale yellow, or as told by your doctor. °· Do not let the drainage bag or tubing touch the floor. °· Wear cotton underwear. °· If you are male, wipe from front to back after you poop (have a bowel movement). °· Check on the catheter often to make sure it works and the tubing is not twisted. °Get help if: °· Your pee is cloudy. °· Your pee smells unusually bad. °· Your pee is not draining into the bag. °· Your tube gets clogged. °· Your catheter starts to leak. °· Your bladder feels full. °Get help right away if: °· You have redness, swelling, or pain where the catheter enters your body. °· You have fluid, pus, or a bad smell coming from the area where the catheter enters your body. °· The area where the catheter enters your body feels warm. °· You have a fever. °· You have pain in your: °? Stomach (abdomen). °? Legs. °? Lower back. °? Bladder. °· You see blood fill the catheter. °· Your pee is pink or red. °· You feel sick to your stomach (nauseous). °· You throw up (vomit). °· You have chills. °· Your catheter gets pulled out. °This information is not intended to replace advice given to you by your health care provider. Make sure you discuss any questions you have with your health care provider. °Document Released: 05/19/2012 Document Revised: 12/21/2015 Document Reviewed: 07/07/2013 °[employer]vier Interactive Patient Education © 2018 [employer] ° °

## 2017-10-23 NOTE — Progress Notes (Signed)
Pt having bladder spasms  B AND O supp given

## 2017-10-23 NOTE — Anesthesia Post-op Follow-up Note (Signed)
Anesthesia QCDR form completed.        

## 2017-10-23 NOTE — Transfer of Care (Signed)
Immediate Anesthesia Transfer of Care Note  Patient: Jordan [name]  Procedure(s) Performed: TRANSURETHRAL RESECTION OF BLADDER TUMOR (TURBT) (N/A Bladder) CYSTOSCOPY WITH RETROGRADE PYELOGRAM (Bilateral Ureter) CYSTOSCOPY WITH STENT PLACEMENT (Bilateral Ureter) CYSTOSCOPY WITH antegrade nephrostogram (Bilateral Ureter)  Patient Location: PACU  Anesthesia Type:General  Level of Consciousness: awake  Airway & Oxygen Therapy: Patient Spontanous Breathing and Patient connected to face mask oxygen  Post-op Assessment: Report given to RN and Post -op Vital signs reviewed and stable  Post vital signs: Reviewed and stable  Last Vitals:  Vitals Value Taken Time  BP 153/68 10/23/2017  3:00 PM  Temp    Pulse 82 10/23/2017  3:04 PM  Resp 16 10/23/2017  3:04 PM  SpO2 100 % 10/23/2017  3:04 PM  Vitals shown include unvalidated device data.  Last Pain:  Vitals:   10/23/17 1052  TempSrc: Oral  PainSc: 0-No pain         Complications: No apparent anesthesia complications

## 2017-10-23 NOTE — Op Note (Signed)
Date of procedure: 10/23/17  Preoperative diagnosis:  1. Bilateral hydroureteronephrosis 2. Bladder mass/tumor  Postoperative diagnosis:  1. Same as above  Procedure: 1. TURBT, 5+ centimeters 2. Bilateral ureteral stent placement 3. Retrograde pyelogram 4. 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  Bilateral distal ureteral stricture, extending from iliac down to bladder highly concerning for extrinsic compression.  EBL: Minimal  Specimens: Bladder tumor (TUR specimen and separate cold cup)  Drains: Foley catheter, 6 x 26 French Bard Optima ureteral stents bilaterally  Indication: Jordan [name] is a 67 y.o. patient with severe acute renal failure found to have bilateral hydronephrosis and concern for infiltrative bladder tumor.  After reviewing the management options for treatment, he elected to proceed with the above surgical procedure(s). We have discussed the potential benefits and risks of the procedure, side effects of the proposed treatment, the likelihood of the patient achieving the goals of the procedure, and any potential problems that might occur during the procedure or recuperation. Informed consent has been obtained.  Description of procedure:  The patient was taken to the operating room and general anesthesia was induced.  At the beginning of the procedure, his nephrostomy tubes were clamped. The patient was placed in the dorsal lithotomy position, prepped and draped in the usual sterile fashion, and preoperative antibiotics were administered. A preoperative time-out was performed.   Male sounds were used to dilate the urethral meatus.  A 26 French resectoscope was then advanced per urethra into the bladder.  Upon entry into the bladder, the  bladder was noted to be grossly abnormal.  There is diffuse nodular, edematous, and areas of papillary tumor involving the majority of the surface of the bladder, at least 70% of the surface area greater than 5 cm.  The trigone itself is also distorted and nodular.  Ultimately, I was able to identify each ureteral orifice and bilateral retrograde pyelograms were performed.  On both sides, there was a high-grade distal ureteral obstruction with a very small caliber ureter which started at the level of the iliacs and extended all the way down to level of the bladder.  This is highly concerning for extrinsic compression of the ureter.  Contrast material was retained throughout the entire case and did not drain with nephrostomy tubes clamped.  Each side was then cannulated with a wire, I had more difficulty getting the wire up on the left side requiring an angled Glidewire but ultimately was successful.  A 5 French open-ended ureteral catheter was advanced to the proximal ureter and the wire was exchanged with a sensor wire.  Each of the sensor wires were then stop to place a safety wires for planned ureteral stent placement at the end of the case.  Next, extensive TURBT was performed.  Due to the grossly abnormal bladder, is difficult to assess what was tumor given that there is no real normal tissue throughout the bladder which made resection somewhat difficult.  I attempted to take the lesion down to level of the muscularis propria in several areas but due to distortion, it is difficult to identify a normal muscularis propria.  In one area, use cold cup biopsy forcep in order to limit cautery artifact and ensure that the proper tissue was received for diagnostic purposes.  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  After adequate tumor was resected, careful hemostasis was achieved.  Next, the safety wire on the  right side was backloaded over rigid cystoscope.  A 6 x 26 French double-J ureteral stent, Bard Optima was placed on the side relatively easily.  The same procedure was performed on the left but this was significantly more difficult and due to the high-grade distal ureteral obstruction, I was successful with some difficulty placing a stent on the side.  After stents were placed, waited several minutes and there was still retained contrast material in the upper tract concerning for stent failure.  On either side, 5 cc of diluted methylene blue solution was injected per nephrostomy tube to ensure that the stents were patent and draining.  I did see a reflux from each of the stents with blue thus the stents were at least partially draining.  I elected to leave the nephrostomy tubes clamped at this time and place a catheter for maximal urinary drainage.  The patient was then clean and dry, repositioned supine position, reversed from anesthesia, taken to PACU in stable condition.  Plan: I will follow-up with this patient extremely closely.  I will bring him back in 2 days to recheck his creatinine.  He was advised to monitor his urine output.  Very low threshold to reopen his nephrostomy tubes.  If his creatinine remains stable with the stents in place and his nephrostomy tube clamped, will remove the nephrostomy tubes in the office and eventually that his Foley catheter.  I discussed this plan with his sister.  All questions were answered.  Hollice Espy, M.D.

## 2017-10-24 ENCOUNTER — Encounter: Payer: Self-pay | Admitting: Urology

## 2017-10-24 NOTE — Anesthesia Postprocedure Evaluation (Signed)
Anesthesia Post Note  Patient: Jordan [name]  Procedure(s) Performed: TRANSURETHRAL RESECTION OF BLADDER TUMOR (TURBT) (N/A Bladder) CYSTOSCOPY WITH RETROGRADE PYELOGRAM (Bilateral Ureter) CYSTOSCOPY WITH STENT PLACEMENT (Bilateral Ureter) CYSTOSCOPY WITH antegrade nephrostogram (Bilateral Ureter)  Patient location during evaluation: PACU Anesthesia Type: General Level of consciousness: awake and alert Pain management: pain level controlled Vital Signs Assessment: post-procedure vital signs reviewed and stable Respiratory status: spontaneous breathing, nonlabored ventilation and respiratory function stable Cardiovascular status: blood pressure returned to baseline and stable Postop Assessment: no apparent nausea or vomiting Anesthetic complications: no     Last Vitals:  Vitals:   10/23/17 1618 10/23/17 1651  BP: (!) 150/78 (!) 157/84  Pulse: 97 100  Resp: 16   Temp: (!) 36.2 C   SpO2: 100%     Last Pain:  Vitals:   10/23/17 1618  TempSrc: Temporal  PainSc: [place]

## 2017-10-25 ENCOUNTER — Emergency Department: Payer: Medicare Other

## 2017-10-25 ENCOUNTER — Other Ambulatory Visit: Payer: Self-pay | Admitting: Anatomic Pathology & Clinical Pathology

## 2017-10-25 ENCOUNTER — Other Ambulatory Visit
Admission: RE | Admit: 2017-10-25 | Discharge: 2017-10-25 | Disposition: A | Discharge status: Home / Self Care | Payer: Medicare Other | Source: Ambulatory Visit | Attending: Urology | Admitting: Urology

## 2017-10-25 ENCOUNTER — Ambulatory Visit (INDEPENDENT_AMBULATORY_CARE_PROVIDER_SITE_OTHER): Payer: Medicare Other | Admitting: Urology

## 2017-10-25 ENCOUNTER — Encounter: Payer: Self-pay | Admitting: Urology

## 2017-10-25 ENCOUNTER — Encounter: Payer: Self-pay | Admitting: Emergency Medicine

## 2017-10-25 ENCOUNTER — Inpatient Hospital Stay
Admission: EM | Admit: 2017-10-25 | Discharge: 2017-10-28 | DRG: 660 | Disposition: A | Discharge status: Skilled Nursing Facility | Payer: Medicare Other | Attending: Internal Medicine | Admitting: Internal Medicine

## 2017-10-25 VITALS — BP 160/73 | HR 87 | Ht 70.0 in

## 2017-10-25 DIAGNOSIS — I480 Paroxysmal atrial fibrillation: Secondary | ICD-10-CM | POA: Diagnosis present

## 2017-10-25 DIAGNOSIS — Z888 Allergy status to other drugs, medicaments and biological substances status: Secondary | ICD-10-CM | POA: Diagnosis not present

## 2017-10-25 DIAGNOSIS — D62 Acute posthemorrhagic anemia: Secondary | ICD-10-CM | POA: Diagnosis present

## 2017-10-25 DIAGNOSIS — T83518A Infection and inflammatory reaction due to other urinary catheter, initial encounter: Secondary | ICD-10-CM | POA: Diagnosis present

## 2017-10-25 DIAGNOSIS — Z79899 Other long term (current) drug therapy: Secondary | ICD-10-CM | POA: Diagnosis not present

## 2017-10-25 DIAGNOSIS — I69351 Hemiplegia and hemiparesis following cerebral infarction affecting right dominant side: Secondary | ICD-10-CM | POA: Diagnosis not present

## 2017-10-25 DIAGNOSIS — I130 Hypertensive heart and chronic kidney disease with heart failure and stage 1 through stage 4 chronic kidney disease, or unspecified chronic kidney disease: Secondary | ICD-10-CM | POA: Diagnosis present

## 2017-10-25 DIAGNOSIS — I5022 Chronic systolic (congestive) heart failure: Secondary | ICD-10-CM | POA: Diagnosis present

## 2017-10-25 DIAGNOSIS — N400 Enlarged prostate without lower urinary tract symptoms: Secondary | ICD-10-CM | POA: Diagnosis present

## 2017-10-25 DIAGNOSIS — E1065 Type 1 diabetes mellitus with hyperglycemia: Secondary | ICD-10-CM | POA: Diagnosis present

## 2017-10-25 DIAGNOSIS — C678 Malignant neoplasm of overlapping sites of bladder: Secondary | ICD-10-CM | POA: Diagnosis not present

## 2017-10-25 DIAGNOSIS — Z85038 Personal history of other malignant neoplasm of large intestine: Secondary | ICD-10-CM | POA: Diagnosis not present

## 2017-10-25 DIAGNOSIS — R531 Weakness: Secondary | ICD-10-CM

## 2017-10-25 DIAGNOSIS — E0590 Thyrotoxicosis, unspecified without thyrotoxic crisis or storm: Secondary | ICD-10-CM | POA: Diagnosis present

## 2017-10-25 DIAGNOSIS — Z23 Encounter for immunization: Secondary | ICD-10-CM

## 2017-10-25 DIAGNOSIS — T83512A Infection and inflammatory reaction due to nephrostomy catheter, initial encounter: Principal | ICD-10-CM | POA: Diagnosis present

## 2017-10-25 DIAGNOSIS — E875 Hyperkalemia: Secondary | ICD-10-CM

## 2017-10-25 DIAGNOSIS — R739 Hyperglycemia, unspecified: Secondary | ICD-10-CM

## 2017-10-25 DIAGNOSIS — Z87891 Personal history of nicotine dependence: Secondary | ICD-10-CM | POA: Diagnosis not present

## 2017-10-25 DIAGNOSIS — E1010 Type 1 diabetes mellitus with ketoacidosis without coma: Secondary | ICD-10-CM | POA: Diagnosis not present

## 2017-10-25 DIAGNOSIS — D494 Neoplasm of unspecified behavior of bladder: Secondary | ICD-10-CM | POA: Diagnosis not present

## 2017-10-25 DIAGNOSIS — D72829 Elevated white blood cell count, unspecified: Secondary | ICD-10-CM | POA: Diagnosis not present

## 2017-10-25 DIAGNOSIS — E1022 Type 1 diabetes mellitus with diabetic chronic kidney disease: Secondary | ICD-10-CM | POA: Diagnosis present

## 2017-10-25 DIAGNOSIS — N183 Chronic kidney disease, stage 3 (moderate): Secondary | ICD-10-CM | POA: Diagnosis present

## 2017-10-25 DIAGNOSIS — N136 Pyonephrosis: Secondary | ICD-10-CM | POA: Diagnosis present

## 2017-10-25 DIAGNOSIS — N3289 Other specified disorders of bladder: Secondary | ICD-10-CM | POA: Diagnosis present

## 2017-10-25 DIAGNOSIS — N390 Urinary tract infection, site not specified: Secondary | ICD-10-CM | POA: Diagnosis present

## 2017-10-25 DIAGNOSIS — N1330 Unspecified hydronephrosis: Secondary | ICD-10-CM

## 2017-10-25 DIAGNOSIS — Z66 Do not resuscitate: Secondary | ICD-10-CM | POA: Diagnosis present

## 2017-10-25 DIAGNOSIS — C679 Malignant neoplasm of bladder, unspecified: Secondary | ICD-10-CM | POA: Diagnosis present

## 2017-10-25 DIAGNOSIS — E785 Hyperlipidemia, unspecified: Secondary | ICD-10-CM | POA: Diagnosis present

## 2017-10-25 DIAGNOSIS — R3989 Other symptoms and signs involving the genitourinary system: Secondary | ICD-10-CM

## 2017-10-25 LAB — URINALYSIS, COMPLETE (UACMP) WITH MICROSCOPIC
BILIRUBIN URINE: NEGATIVE
KETONES UR: 5 mg/dL — AB
NITRITE: NEGATIVE
PH: 5 (ref 5.0–8.0)
Protein, ur: 100 mg/dL — AB
RBC / HPF: >50 RBC/hpf — ABNORMAL HIGH (ref 0–5)
SPECIFIC GRAVITY, URINE: 1.014 (ref 1.005–1.030)
Squamous Epithelial / LPF: NONE SEEN (ref 0–5)

## 2017-10-25 LAB — BASIC METABOLIC PANEL
ANION GAP: 12 (ref 5–15)
ANION GAP: 10 (ref 5–15)
BUN: 36 mg/dL — AB (ref 8–23)
BUN: 37 mg/dL — AB (ref 8–23)
CO2: 20 mmol/L — AB (ref 22–32)
CO2: 19 mmol/L — AB (ref 22–32)
Calcium: 8.4 mg/dL — ABNORMAL LOW (ref 8.9–10.3)
Calcium: 8.5 mg/dL — ABNORMAL LOW (ref 8.9–10.3)
Chloride: 98 mmol/L (ref 98–111)
Chloride: 98 mmol/L (ref 98–111)
Creatinine, Ser: 2.22 mg/dL — ABNORMAL HIGH (ref 0.61–1.24)
Creatinine, Ser: 2.16 mg/dL — ABNORMAL HIGH (ref 0.61–1.24)
GFR calc Af Amer: 34 mL/min — ABNORMAL LOW (ref >60)
GFR calc Af Amer: 35 mL/min — ABNORMAL LOW (ref >60)
GFR calc non Af Amer: 30 mL/min — ABNORMAL LOW (ref >60)
GFR, EST NON AFRICAN AMERICAN: 29 mL/min — AB (ref >60)
GLUCOSE: 546 mg/dL — AB (ref 70–99)
GLUCOSE: 574 mg/dL — AB (ref 70–99)
POTASSIUM: 5.6 mmol/L — AB (ref 3.5–5.1)
POTASSIUM: 5.8 mmol/L — AB (ref 3.5–5.1)
Sodium: 130 mmol/L — ABNORMAL LOW (ref 135–145)
Sodium: 127 mmol/L — ABNORMAL LOW (ref 135–145)

## 2017-10-25 LAB — GLUCOSE, CAPILLARY
GLUCOSE-CAPILLARY: 418 mg/dL — AB (ref 70–99)
Glucose-Capillary: 532 mg/dL (ref 70–99)
Glucose-Capillary: 574 mg/dL (ref 70–99)

## 2017-10-25 LAB — CBC
HEMATOCRIT: 29 % — AB (ref 40.0–52.0)
HEMOGLOBIN: 9.5 g/dL — AB (ref 13.0–18.0)
MCH: 30 pg (ref 26.0–34.0)
MCHC: 32.8 g/dL (ref 32.0–36.0)
MCV: 91.3 fL (ref 80.0–100.0)
Platelets: 662 10*3/uL — ABNORMAL HIGH (ref 150–440)
RBC: 3.18 MIL/uL — ABNORMAL LOW (ref 4.40–5.90)
RDW: 15.3 % — AB (ref 11.5–14.5)
WBC: 20.1 10*3/uL — ABNORMAL HIGH (ref 3.8–10.6)

## 2017-10-25 LAB — TROPONIN I: Troponin I: <0.03 ng/mL (ref <0.03)

## 2017-10-25 LAB — LACTIC ACID, PLASMA: Lactic Acid, Venous: 1.8 mmol/L (ref 0.5–1.9)

## 2017-10-25 LAB — SURGICAL PATHOLOGY

## 2017-10-25 LAB — DIGOXIN LEVEL: Digoxin Level: 0.6 ng/mL — ABNORMAL LOW (ref 0.8–2.0)

## 2017-10-25 MED ORDER — SODIUM CHLORIDE 0.9 % IV SOLN: 1.0000 g | Freq: Once | INTRAVENOUS | Status: DC

## 2017-10-25 MED ORDER — PIPERACILLIN-TAZOBACTAM 3.375 G IVPB
3.3750 g | Freq: Three times a day (TID) | INTRAVENOUS | Status: DC
  Administered 2017-10-26 – 2017-10-28 (×8): 3.375 g via INTRAVENOUS
  Filled 2017-10-25 (×8): qty 50

## 2017-10-25 MED ORDER — SODIUM CHLORIDE 0.9 % IV BOLUS
500.0000 mL | Freq: Once | INTRAVENOUS | Status: AC
  Administered 2017-10-25: 500 mL via INTRAVENOUS

## 2017-10-25 MED ORDER — INSULIN ASPART 100 UNIT/ML ~~LOC~~ SOLN
10.0000 [IU] | Freq: Once | SUBCUTANEOUS | Status: AC
  Administered 2017-10-25: 10 [IU] via SUBCUTANEOUS
  Filled 2017-10-25: qty 1

## 2017-10-25 MED ORDER — PIPERACILLIN-TAZOBACTAM 3.375 G IVPB 30 MIN
3.3750 g | Freq: Once | INTRAVENOUS | Status: AC
  Administered 2017-10-25: 3.375 g via INTRAVENOUS
  Filled 2017-10-25: qty 50

## 2017-10-25 MED ORDER — SODIUM CHLORIDE 0.9 % IV BOLUS
1000.0000 mL | Freq: Once | INTRAVENOUS | Status: AC
  Administered 2017-10-25: 1000 mL via INTRAVENOUS

## 2017-10-25 NOTE — ED Notes (Signed)
Urine sample was collected and sent to lab

## 2017-10-25 NOTE — Progress Notes (Signed)
Family Meeting Note  Advance Directive:no  Today a meeting took place with the Patient.  Patient is able to participate.  The following clinical team members were present during this meeting:MD  The following were discussed:Patient's diagnosis: , Patient's progosis: Unable to determine and Goals for treatment: Full Code  Additional follow-up to be provided: prn  Time spent during discussion:20 minutes  Jordan [name] D [name], MD  

## 2017-10-25 NOTE — ED Provider Notes (Addendum)
[facility] Emergency Department Provider Note  ____________________________________________   I have reviewed the triage vital signs and the nursing notes. Where available I have reviewed prior notes and, if possible and indicated, outside hospital notes.    HISTORY  Chief Complaint Hyperglycemia    HPI Jordan [name] is a 67 y.o. male  Complicated medical history including hemicolectomy, colon cancer in the past, recent TURBT procedure with diagnosis of bladder cancer, has clamped bilateral nephrotic tubes.  Has pain and discomfort with the Foley.  Blood sugar was found to be elevated at home he was sent in here for further care.  Anion gap was normal but sugar was quite high white count was up.  Foley is draining well, and he is on Keflex at home.  No fevers.     Past Medical History:  Diagnosis Date  . Atrial fibrillation ([place])   . Cancer ([place])    a. 06/2011 b. s/p right hemicolectomy/ colon CA  . Colon polyps   . Dysrhythmia   . Hypertension   . Hyperthyroidism    a. 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 Stroke ([place]) 03/2012   a. residual right sided weakness  . Type 1 diabetes ([place])    a. on insulin b. prior DKA     Patient Active Problem List   Diagnosis Date Noted  . Acute kidney injury ([place]) 09/21/2017  . Hyperthyroidism 03/30/2014  . Type I diabetes mellitus ([place]) 03/30/2014  . Hyperglycemia 03/30/2014  . Thyrotoxicosis 03/30/2014  . History of stroke 03/30/2014  . Malnutrition of moderate degree ([place]) 03/28/2014  . Atrial fibrillation with rapid ventricular response ([place]) 03/19/2014    Past Surgical History:  Procedure Laterality Date  . CARDIOVERSION N/A 03/25/2014   Procedure: CARDIOVERSION;  Surgeon: Lelon Perla, MD;  Location: [facility] ENDOSCOPY;  Service: Cardiovascular;  Laterality: N/A;  . COLONOSCOPY    . COLONOSCOPY WITH PROPOFOL N/A 07/24/2017   Procedure: COLONOSCOPY WITH PROPOFOL;  Surgeon:  Toledo, Benay Pike, MD;  Location: [facility] ENDOSCOPY;  Service: Gastroenterology;  Laterality: N/A;  . CYSTOSCOPY W/ RETROGRADES Bilateral 10/23/2017   Procedure: CYSTOSCOPY WITH RETROGRADE PYELOGRAM;  Surgeon: Hollice Espy, MD;  Location: [facility] ORS;  Service: Urology;  Laterality: Bilateral;  . CYSTOSCOPY WITH RETROGRADE URETHROGRAM Bilateral 10/23/2017   Procedure: CYSTOSCOPY WITH antegrade nephrostogram;  Surgeon: Hollice Espy, MD;  Location: [facility] ORS;  Service: Urology;  Laterality: Bilateral;  . CYSTOSCOPY WITH STENT PLACEMENT Bilateral 10/23/2017   Procedure: CYSTOSCOPY WITH STENT PLACEMENT;  Surgeon: Hollice Espy, MD;  Location: [facility] ORS;  Service: Urology;  Laterality: Bilateral;  . HEMICOLECTOMY Right   . IR NEPHROSTOMY PLACEMENT LEFT  09/22/2017  . IR NEPHROSTOMY PLACEMENT RIGHT  09/22/2017  . TRANSURETHRAL RESECTION OF BLADDER TUMOR N/A 10/23/2017   Procedure: TRANSURETHRAL RESECTION OF BLADDER TUMOR (TURBT);  Surgeon: Hollice Espy, MD;  Location: [facility] ORS;  Service: Urology;  Laterality: N/A;    Prior to Admission medications   Medication Sig Start Date End Date Taking? Authorizing Provider  acetaminophen (TYLENOL) 325 MG tablet Take 2 tablets (650 mg total) by mouth every 6 (six) hours as needed for mild pain (or Fever >/= 101). 10/01/17  Yes Gouru, Illene Silver, MD  amiodarone (PACERONE) 200 MG tablet Take 200 mg by mouth daily. 10/04/17  Yes [provider]  digoxin 62.5 MCG TABS Take 0.0625 mg by mouth daily. 10/01/17  Yes Gouru, Illene Silver, MD  docusate sodium (COLACE) 100 MG capsule Take 1 capsule (  100 mg total) by mouth 2 (two) times daily. 10/23/17  Yes Hollice Espy, MD  famotidine (PEPCID) 20 MG tablet Take 1 tablet (20 mg total) by mouth daily. 10/02/17  Yes Gouru, Illene Silver, MD  folic acid (FOLVITE) 1 MG tablet Take 1 mg by mouth daily.   Yes [provider]  HYDROcodone-acetaminophen (NORCO/VICODIN) 5-325 MG tablet Take 1-2 tablets by mouth every 6 (six) hours as needed  for moderate pain. 10/23/17  Yes Hollice Espy, MD  insulin aspart (NOVOLOG) 100 UNIT/ML injection Inject 10 Units into the skin 3 (three) times daily with meals. Patient taking differently: Inject 18 Units into the skin 3 (three) times daily with meals.  10/01/17  Yes Gouru, Aruna, MD  insulin aspart (NOVOLOG) 100 UNIT/ML injection Inject 0-15 Units into the skin 4 (four) times daily -  before meals and at bedtime. CBG < 70: implement hypoglycemia protocol CBG 70 - 120: 0 units CBG 121 - 150: 2 units CBG 151 - 200: 3 units CBG 201 - 250: 5 units CBG 251 - 300: 8 units CBG 301 - 350: 11 units CBG 351 - 400: 15 units CBG > 400: call MD and obtain STAT lab verification 10/01/17  Yes Gouru, Aruna, MD  insulin glargine (LANTUS) 100 UNIT/ML injection Inject 0.37 mLs (37 Units total) into the skin daily. Patient taking differently: Inject 30 Units into the skin at bedtime.  10/02/17  Yes Gouru, Illene Silver, MD  loratadine (CLARITIN) 10 MG tablet Take 1 tablet (10 mg total) by mouth daily. 10/02/17  Yes Gouru, Illene Silver, MD  metoprolol tartrate (LOPRESSOR) 25 MG tablet Take 1 tablet (25 mg total) by mouth 2 (two) times daily. 10/01/17  Yes Gouru, Illene Silver, MD  Multiple Vitamin (MULTIVITAMIN WITH MINERALS) TABS tablet Take 1 tablet by mouth daily. 10/02/17  Yes Gouru, Illene Silver, MD  oxybutynin (DITROPAN) 5 MG tablet Take 1 tablet (5 mg total) by mouth every 8 (eight) hours as needed for bladder spasms. 10/23/17  Yes Hollice Espy, MD  protein supplement shake (PREMIER PROTEIN) LIQD Take 325 mLs (11 oz total) by mouth 2 (two) times daily between meals. 10/01/17  Yes Gouru, Illene Silver, MD  Rivaroxaban (XARELTO) 15 MG TABS tablet Take 15 mg by mouth daily.   Yes [provider]  rosuvastatin (CRESTOR) 5 MG tablet Take 1 tablet (5 mg total) by mouth daily at 6 PM. 10/01/17  Yes Gouru, Aruna, MD  tamsulosin (FLOMAX) 0.4 MG CAPS capsule Take 1 capsule (0.4 mg total) by mouth daily after supper. 10/01/17  Yes Gouru, Illene Silver, MD   vitamin C (VITAMIN C) 250 MG tablet Take 1 tablet (250 mg total) by mouth 2 (two) times daily. 10/01/17  Yes Gouru, Illene Silver, MD    Allergies Amiodarone  Family History  Problem Relation Age of Onset  . Heart attack Mother     Social History Social History   Tobacco Use  . Smoking status: Former Smoker    Last attempt to quit: 09/21/1973    Years since quitting: 44.1  . Smokeless tobacco: Never Used  Substance Use Topics  . Alcohol use: No  . Drug use: No    Review of Systems Constitutional: No fever/chills Eyes: No visual changes. ENT: No sore throat. No stiff neck no neck pain Cardiovascular: Denies chest pain. Respiratory: Denies shortness of breath. Gastrointestinal:   no vomiting.  No diarrhea.  No constipation. Genitourinary: Foley is uncomfortable Musculoskeletal: Negative lower extremity swelling Skin: Negative for rash. Neurological: Negative for severe headaches, focal weakness or numbness.  ____________________________________________   PHYSICAL EXAM:  VITAL SIGNS: ED Triage Vitals  Enc Vitals Group     BP 10/25/17 1521 (!) 149/73     Pulse Rate 10/25/17 1521 92     Resp 10/25/17 1830 18     Temp 10/25/17 1521 98.9 F (37.2 C)     Temp Source 10/25/17 1521 Oral     SpO2 10/25/17 1521 100 %     Weight 10/25/17 1523 159 lb 3 oz (72.2 kg)     Height 10/25/17 1523 5\' 10"  (1.778 m)     Head Circumference --      Peak Flow --      Pain Score 10/25/17 1522 8     Pain Loc --      Pain Edu? --      Excl. in [place]? --     Constitutional: Alert and oriented. Well appearing and in no acute distress. Eyes: Conjunctivae are normal Head: Atraumatic HEENT: No congestion/rhinnorhea. Mucous membranes are moist.  Oropharynx non-erythematous Neck:   Nontender with no meningismus, no masses, no stridor Cardiovascular: Normal rate, regular rhythm. Grossly normal heart sounds.  Good peripheral circulation. Respiratory: Normal respiratory effort.  No retractions.  Lungs CTAB. Abdominal: Soft and nontender. No distention. No guarding no rebound Back:  There is no focal tenderness or step off.  there is no midline tenderness there are no lesions noted. there is no CVA tenderness U: Normal external genitalia, the Foley is in place but the Foley adhesive around his leg is stretched around his knee causing significant traction on the Foley catheter himself.  When I relieve this traction patient's pain goes away and he states his Foley no longer hurts. Musculoskeletal: No lower extremity tenderness, no upper extremity tenderness. No joint effusions, no DVT signs strong distal pulses no edema Neurologic:  Normal speech and language. No gross focal neurologic deficits are appreciated.  Skin:  Skin is warm, dry and intact. No rash noted. Psychiatric: Mood and affect are normal. Speech and behavior are normal.  ____________________________________________   LABS (all labs ordered are listed, but only abnormal results are displayed)  Labs Reviewed  CBC - Abnormal; Notable for the following components:      Result Value   WBC 20.1 (*)    RBC 3.18 (*)    Hemoglobin 9.5 (*)    HCT 29.0 (*)    RDW 15.3 (*)    Platelets 662 (*)    All other components within normal limits  GLUCOSE, CAPILLARY - Abnormal; Notable for the following components:   Glucose-Capillary 532 (*)    All other components within normal limits  BASIC METABOLIC PANEL - Abnormal; Notable for the following components:   Sodium 127 (*)    Potassium 5.8 (*)    CO2 19 (*)    Glucose, Bld 574 (*)    BUN 37 (*)    Creatinine, Ser 2.16 (*)    Calcium 8.5 (*)    GFR calc non Af Amer 30 (*)    GFR calc Af Amer 35 (*)    All other components within normal limits  URINALYSIS, COMPLETE (UACMP) WITH MICROSCOPIC - Abnormal; Notable for the following components:   Color, Urine YELLOW (*)    APPearance TURBID (*)    Glucose, UA >=500 (*)    Hgb urine dipstick LARGE (*)    Ketones, ur 5 (*)     Protein, ur 100 (*)    Leukocytes, UA LARGE (*)    RBC / HPF >  50 (*)    WBC, UA >50 (*)    Bacteria, UA RARE (*)    All other components within normal limits  GLUCOSE, CAPILLARY - Abnormal; Notable for the following components:   Glucose-Capillary 574 (*)    All other components within normal limits  URINE CULTURE  CULTURE, BLOOD (ROUTINE X 2)  CULTURE, BLOOD (ROUTINE X 2)  TROPONIN I  LACTIC ACID, PLASMA  DIGOXIN LEVEL  LACTIC ACID, PLASMA  CBG MONITORING, ED    Pertinent labs  results that were available during my care of the patient were reviewed by me and considered in my medical decision making (see chart for details). ____________________________________________  EKG  I personally interpreted any EKGs ordered by me or triage Normal sinus rhythm rate 92 bpm no acute ST elevation or depression normal axis unremarkable EKG  ____________________________________________  RADIOLOGY  Pertinent labs & imaging results that were available during my care of the patient were reviewed by me and considered in my medical decision making (see chart for details). If possible, patient and/or family made aware of any abnormal findings.  Dg Chest Port 1 View  Result Date: 10/25/2017 CLINICAL DATA:  Hyperglycemia. Hyperkalemia. Recent acute renal failure. EXAM: PORTABLE CHEST 1 VIEW COMPARISON:  09/26/2017 and 09/23/2017 radiographs. FINDINGS: 1834 hour. The heart size and mediastinal contours are normal. The lungs are clear. There is no pleural effusion or pneumothorax. No acute osseous findings are identified. There are probable bilateral ureteral stents. IMPRESSION: No active cardiopulmonary process. Electronically Signed   By: Richardean Sale M.D.   On: 10/25/2017 18:52   ____________________________________________    PROCEDURES  Procedure(s) performed: None  Procedures  Critical Care performed: CRITICAL CARE Performed by: Schuyler Amor   Total critical care time: 45  minutes  Critical care time was exclusive of separately billable procedures and treating other patients.  Critical care was necessary to treat or prevent imminent or life-threatening deterioration.  Critical care was time spent personally by me on the following activities: development of treatment plan with patient and/or surrogate as well as nursing, discussions with consultants, evaluation of patient's response to treatment, examination of patient, obtaining history from patient or surrogate, ordering and performing treatments and interventions, ordering and review of laboratory studies, ordering and review of radiographic studies, pulse oximetry and re-evaluation of patient's condition.   ____________________________________________   INITIAL IMPRESSION / ASSESSMENT AND PLAN / ED COURSE  Pertinent labs & imaging results that were available during my care of the patient were reviewed by me and considered in my medical decision making (see chart for details).  Given white count elevated sugar and we did give the patient IV fluid, we are obtaining cultures.  Lactic is reassuring.  Kidney function is reassuring and close to his baseline.  I talked to Dr. Junious Silk of urology who agrees with management.  We will give the patient Zosyn he is already on Keflex last culture showed Klebsiella oxytoca which was sensitive to everything but ampicillin, sensitive to Zosyn.  We will give him IV antibiotics and given that we are already addressing his sugar with fluids and there is no evidence of DKA we did only give him a subcu insulin shot.  We will continue to watch his sugars and we will admit him    ____________________________________________   FINAL CLINICAL IMPRESSION(S) / ED DIAGNOSES  Final diagnoses:  Weakness      This chart was dictated using voice recognition software.  Despite best efforts to proofread,  errors can  occur which can change meaning.      Schuyler Amor,  MD 10/25/17 2001    Schuyler Amor, MD 10/25/17 2002

## 2017-10-25 NOTE — Progress Notes (Signed)
Pharmacy Antibiotic Note  Jordan [name] is a 67 y.o. male admitted on 10/25/2017 with UTI.  Pharmacy has been consulted for Zosyn dosing.  Plan: Zosyn 3.375g IV q8h (4 hour infusion).  Height: 5\' 10"  (177.8 cm) Weight: 159 lb 3 oz (72.2 kg) IBW/kg (Calculated) : 73  Temp (24hrs), Avg:98.9 F (37.2 C), Min:98.9 F (37.2 C), Max:98.9 F (37.2 C)  Recent Labs  Lab 10/25/17 1353 10/25/17 1526 10/25/17 1607 10/25/17 1858  WBC  --  20.1*  --   --   CREATININE 2.22*  --  2.16*  --   LATICACIDVEN  --   --   --  1.8    Estimated Creatinine Clearance: 33.9 mL/min (A) (by C-G formula based on SCr of 2.16 mg/dL (H)).    Allergies  Allergen Reactions  . Amiodarone Other (See Comments)    Thyrotoxicosis per Murray Hodgkins, NP    Antimicrobials this admission: Zosyn 9/20  >>    >>   Dose adjustments this admission:   Microbiology results: 9/20 BCx: pending 9/20 UCx: pending       9/20 UA: LE(+) NO2(-)  WBC >50 Thank you for allowing pharmacy to be a part of this patient's care.  Jaretzi Droz S 10/25/2017 10:09 PM

## 2017-10-25 NOTE — H&P (Addendum)
[facility] NAME: Jordan [name]    MR#:  [id number]  DATE OF BIRTH:  January 02, 1951  DATE OF ADMISSION:  10/25/2017  PRIMARY CARE PHYSICIAN: Albina Billet, MD   REQUESTING/REFERRING PHYSICIAN: Charlotte Crumb, MD  CHIEF COMPLAINT:   Chief Complaint  Patient presents with  . Hyperglycemia    HISTORY OF PRESENT ILLNESS:  Jordan [name]  is a 67 y.o. male with a known history of HTN, type 1 diabetes, paroxysmal atrial fibrillation, hyperthyroidism, history of colon cancer s/p right hemicolectomy, recent diagnosis of bladder cancer who was sent from the urology office to the ED for markedly elevated blood sugars and hyperkalemia. He was recently admitted from 8/17-8/27 with bilateral hydronephrosis and acute renal failure requiring dialysis due to bilateral ureteral obstruction. Bilateral nephrostomy tubes were placed while he was inpatient. He followed up in the urology office and had a cystoscopy that was concerning for bladder. He underwent TURBT and bilateral ureteral stent placement on 10/23/17. Surgical pathology showed high grade carcinoma. He states he has been having bladder and penile pain for the last couple of days. No fevers or chills. No back or flank pain.  In the ED, blood sugars were 574 but anion gap was normal. K was 5.8. UA showed large leukocytes and large hemoglobin. He was given zosyn, fluids, and 10 units of novolog. Hospitalists were called for admission.  PAST MEDICAL HISTORY:   Past Medical History:  Diagnosis Date  . Atrial fibrillation ([place])   . Cancer ([place])    a. 06/2011 b. s/p right hemicolectomy/ colon CA  . Colon polyps   . Dysrhythmia   . Hypertension   . Hyperthyroidism    a. 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 Stroke ([place]) 03/2012   a. residual right sided weakness  . Type 1 diabetes ([place])    a. on insulin b. prior DKA     PAST SURGICAL HISTORY:   Past Surgical History:    Procedure Laterality Date  . CARDIOVERSION N/A 03/25/2014   Procedure: CARDIOVERSION;  Surgeon: Lelon Perla, MD;  Location[facility] ENDOSCOPY;  Service: Cardiovascular;  Laterality: N/A;  . COLONOSCOPY    . COLONOSCOPY WITH PROPOFOL N/A 07/24/2017   Procedure: COLONOSCOPY WITH PROPOFOL;  Surgeon: Toledo, Benay Pike, MD;  Location: [facility] ENDOSCOPY;  Service: Gastroenterology;  Laterality: N/A;  . CYSTOSCOPY W/ RETROGRADES Bilateral 10/23/2017   Procedure: CYSTOSCOPY WITH RETROGRADE PYELOGRAM;  Surgeon: Hollice Espy, MD;  Location: [facility] ORS;  Service: Urology;  Laterality: Bilateral;  . CYSTOSCOPY WITH RETROGRADE URETHROGRAM Bilateral 10/23/2017   Procedure: CYSTOSCOPY WITH antegrade nephrostogram;  Surgeon: Hollice Espy, MD;  Location: [facility] ORS;  Service: Urology;  Laterality: Bilateral;  . CYSTOSCOPY WITH STENT PLACEMENT Bilateral 10/23/2017   Procedure: CYSTOSCOPY WITH STENT PLACEMENT;  Surgeon: Hollice Espy, MD;  Location: [facility] ORS;  Service: Urology;  Laterality: Bilateral;  . HEMICOLECTOMY Right   . IR NEPHROSTOMY PLACEMENT LEFT  09/22/2017  . IR NEPHROSTOMY PLACEMENT RIGHT  09/22/2017  . TRANSURETHRAL RESECTION OF BLADDER TUMOR N/A 10/23/2017   Procedure: TRANSURETHRAL RESECTION OF BLADDER TUMOR (TURBT);  Surgeon: Hollice Espy, MD;  Location: [facility] ORS;  Service: Urology;  Laterality: N/A;    SOCIAL HISTORY:   Social History   Tobacco Use  . Smoking status: Former Smoker    Last attempt to quit: 09/21/1973    Years since quitting: 44.1  . Smokeless tobacco: Never Used  Substance Use Topics  .  Alcohol use: No    FAMILY HISTORY:   Family History  Problem Relation Age of Onset  . Heart attack Mother     DRUG ALLERGIES:   Allergies  Allergen Reactions  . Amiodarone Other (See Comments)    Thyrotoxicosis per Murray Hodgkins, NP    REVIEW OF SYSTEMS:   Review of Systems  Constitutional: Negative for chills and fever.  HENT: Negative for congestion and sore  throat.   Eyes: Negative for blurred vision and double vision.  Respiratory: Negative for cough and shortness of breath.   Cardiovascular: Negative for chest pain, palpitations and leg swelling.  Gastrointestinal: Negative for abdominal pain, nausea and vomiting.  Genitourinary: Negative for dysuria, frequency, hematuria and urgency.  Musculoskeletal: Negative for back pain and neck pain.  Neurological: Negative for dizziness and headaches.  Psychiatric/Behavioral: Negative for depression. The patient is not nervous/anxious.     MEDICATIONS AT HOME:   Prior to Admission medications   Medication Sig Start Date End Date Taking? Authorizing Provider  acetaminophen (TYLENOL) 325 MG tablet Take 2 tablets (650 mg total) by mouth every 6 (six) hours as needed for mild pain (or Fever >/= 101). 10/01/17  Yes Gouru, Illene Silver, MD  amiodarone (PACERONE) 200 MG tablet Take 200 mg by mouth daily. 10/04/17  Yes [provider]  digoxin 62.5 MCG TABS Take 0.0625 mg by mouth daily. 10/01/17  Yes Gouru, Illene Silver, MD  docusate sodium (COLACE) 100 MG capsule Take 1 capsule (100 mg total) by mouth 2 (two) times daily. 10/23/17  Yes Hollice Espy, MD  famotidine (PEPCID) 20 MG tablet Take 1 tablet (20 mg total) by mouth daily. 10/02/17  Yes Gouru, Illene Silver, MD  folic acid (FOLVITE) 1 MG tablet Take 1 mg by mouth daily.   Yes [provider]  HYDROcodone-acetaminophen (NORCO/VICODIN) 5-325 MG tablet Take 1-2 tablets by mouth every 6 (six) hours as needed for moderate pain. 10/23/17  Yes Hollice Espy, MD  insulin aspart (NOVOLOG) 100 UNIT/ML injection Inject 10 Units into the skin 3 (three) times daily with meals. Patient taking differently: Inject 18 Units into the skin 3 (three) times daily with meals.  10/01/17  Yes Gouru, Aruna, MD  insulin aspart (NOVOLOG) 100 UNIT/ML injection Inject 0-15 Units into the skin 4 (four) times daily -  before meals and at bedtime. CBG < 70: implement hypoglycemia  protocol CBG 70 - 120: 0 units CBG 121 - 150: 2 units CBG 151 - 200: 3 units CBG 201 - 250: 5 units CBG 251 - 300: 8 units CBG 301 - 350: 11 units CBG 351 - 400: 15 units CBG > 400: call MD and obtain STAT lab verification 10/01/17  Yes Gouru, Aruna, MD  insulin glargine (LANTUS) 100 UNIT/ML injection Inject 0.37 mLs (37 Units total) into the skin daily. Patient taking differently: Inject 30 Units into the skin at bedtime.  10/02/17  Yes Gouru, Illene Silver, MD  loratadine (CLARITIN) 10 MG tablet Take 1 tablet (10 mg total) by mouth daily. 10/02/17  Yes Gouru, Illene Silver, MD  metoprolol tartrate (LOPRESSOR) 25 MG tablet Take 1 tablet (25 mg total) by mouth 2 (two) times daily. 10/01/17  Yes Gouru, Illene Silver, MD  Multiple Vitamin (MULTIVITAMIN WITH MINERALS) TABS tablet Take 1 tablet by mouth daily. 10/02/17  Yes Gouru, Illene Silver, MD  oxybutynin (DITROPAN) 5 MG tablet Take 1 tablet (5 mg total) by mouth every 8 (eight) hours as needed for bladder spasms. 10/23/17  Yes Hollice Espy, MD  protein supplement shake (PREMIER PROTEIN) LIQD  Take 325 mLs (11 oz total) by mouth 2 (two) times daily between meals. 10/01/17  Yes Gouru, Illene Silver, MD  Rivaroxaban (XARELTO) 15 MG TABS tablet Take 15 mg by mouth daily.   Yes [provider]  rosuvastatin (CRESTOR) 5 MG tablet Take 1 tablet (5 mg total) by mouth daily at 6 PM. 10/01/17  Yes Gouru, Aruna, MD  tamsulosin (FLOMAX) 0.4 MG CAPS capsule Take 1 capsule (0.4 mg total) by mouth daily after supper. 10/01/17  Yes Gouru, Illene Silver, MD  vitamin C (VITAMIN C) 250 MG tablet Take 1 tablet (250 mg total) by mouth 2 (two) times daily. 10/01/17  Yes Gouru, Illene Silver, MD      VITAL SIGNS:  Blood pressure (!) 157/63, pulse 86, temperature 98.9 F (37.2 C), temperature source Oral, resp. rate 18, height 5\' 10"  (1.778 m), weight 72.2 kg, SpO2 100 %.  PHYSICAL EXAMINATION:  Physical Exam  GENERAL:  67 y.o.-year-old patient lying in the bed with no acute distress.  EYES: Pupils equal,  round, reactive to light and accommodation. No scleral icterus. Extraocular muscles intact.  HEENT: Head atraumatic, normocephalic. Oropharynx and nasopharynx clear.  NECK:  Supple, no jugular venous distention. No thyroid enlargement, no tenderness.  LUNGS: Normal breath sounds bilaterally, no wheezing, rales,rhonchi or crepitation. No use of accessory muscles of respiration.  CARDIOVASCULAR: S1, S2 normal. No murmurs, rubs, or gallops. BACK: bilateral nephrostomy tubes in place  ABDOMEN: +mild suprapubic abdominal pain, soft and nondistended. Bowel sounds present. No organomegaly or mass.  EXTREMITIES: No pedal edema, cyanosis, or clubbing.  NEUROLOGIC: Cranial nerves II through XII are intact. Muscle strength 5/5 in all extremities. Sensation intact. Gait not checked.  PSYCHIATRIC: The patient is alert and oriented x 3.  SKIN: No obvious rash, lesion, or ulcer.   LABORATORY PANEL:   CBC Recent Labs  Lab 10/25/17 1526  WBC 20.1*  HGB 9.5*  HCT 29.0*  PLT 662*   ------------------------------------------------------------------------------------------------------------------  Chemistries  Recent Labs  Lab 10/25/17 1607  NA 127*  K 5.8*  CL 98  CO2 19*  GLUCOSE 574*  BUN 37*  CREATININE 2.16*  CALCIUM 8.5*   ------------------------------------------------------------------------------------------------------------------  Cardiac Enzymes Recent Labs  Lab 10/25/17 1607  TROPONINI <0.03   ------------------------------------------------------------------------------------------------------------------  RADIOLOGY:  Dg Chest Port 1 View  Result Date: 10/25/2017 CLINICAL DATA:  Hyperglycemia. Hyperkalemia. Recent acute renal failure. EXAM: PORTABLE CHEST 1 VIEW COMPARISON:  09/26/2017 and 09/23/2017 radiographs. FINDINGS: 1834 hour. The heart size and mediastinal contours are normal. The lungs are clear. There is no pleural effusion or pneumothorax. No acute osseous  findings are identified. There are probable bilateral ureteral stents. IMPRESSION: No active cardiopulmonary process. Electronically Signed   By: Richardean Sale M.D.   On: 10/25/2017 18:52      IMPRESSION AND PLAN:   Complicated UTI- patient with bilateral nephrostomy tubes and foley catheter in place. Recent TURBT and bilateral ureteral stent placement. WBC elevated to 20.1. No signs of sepsis. - continue zosyn - blood and urine cultures pending - will give IVFs overnight  High-grade invasive urothelial carcinoma- just diagnosed 9/18 after TURBT. Follows with Dr. Erlene Quan as an outpatient. Seen by her in clinic today. Has bilateral ureteral stents and bilateral nephrostomy tubes. Was going to have tubes removed today in clinic, but they were left in place. Per Dr. Cherrie Gauze note, can slowly remove his nephrostomy tubes and foley and monitor his renal function as an outpatient. Nephrostomy tubes are currently clamped. - urology consult for further management - continue oxybutynin  and flomax - needs to follow-up with oncology as an outpatient  Hyperglycemia with history of T1DM- not in DKA - lantus 20 units - moderate SSI  Hyperkalemia- K 5.8 in the ED. May be related to his hyperglycemia, although not in DKA. - will give a dose of veltassa - also receiving insulin for hyperkalemia - recheck K in the morning  CKD III- Cr at baseline. Had ARF requiring HD at most recent hospitalization. - monitor - avoid nephrotoxic agents  Paroxysmal atrial fibrillation- in NSR here. - continue amiodarone and metoprolol  - stopped xarelto due to urological procedure- will continue to hold pending urology's recommendations on when to restart  Chronic systolic heart failure- recent ECHO with EF 40%. No signs of volume overload. - continue digoxin and metoprolol   Hypertension- BPs mildly elevated in the ED - continue home metoprolol  - hydralazine prn  Hyperlipidemia- stable - continue  crestor  Acute blood loss anemia- likely related to recent urological procedures and hematuria from his bladder cancer. Hgb at baseline. - monitor  Plan for discharge back to [facility] when medically stable.  All the records are reviewed and case discussed with ED provider. Management plans discussed with the patient, family and they are in agreement.  CODE STATUS: full  TOTAL TIME TAKING CARE OF THIS PATIENT: 45 minutes.    Berna Spare Mayo M.D on 10/25/2017 at 9:18 PM  Between 7am to 6pm - Pager - [phone]  After 6pm go to [email or web address] - Proofreader  [facility]  Office  [phone]  CC: Primary care physician; Albina Billet, MD   Note: This dictation was prepared with Dragon dictation along with smaller phrase technology. Any transcriptional errors that result from this process are unintentional.

## 2017-10-25 NOTE — ED Triage Notes (Addendum)
Pt arrived with family with concerns over abnormal labs. Sister reports blood sugar findings 546 and high potassium.Family reports compliance with diabetic medication but inability to control blood sugar with recenet illness. Pt recently admitted with acute kidney failure.  Pt currently at [facility] for multiple complaints. Pt denies any pain other than at his penis where catheter is inserted.

## 2017-10-25 NOTE — Progress Notes (Signed)
10/25/2017 2:53 PM   Jordan [name] 1950-10-19 [id number]  Referring provider: Albina Billet, MD [address]   [place], [place] [address]  Chief Complaint  Patient presents with  . Urinary Retention    HPI:  67 year old male status post TURBT 2 days ago who presents the office for follow-up.  He initially presented with bilateral ureteral obstruction, hydronephrosis and renal failure.  He underwent bilateral percutaneous nephrostomy tubes during an admission involving ICU level care.  [facility]  He returned to the office and cystoscopic findings were concerning for bladder cancer.  He was taken to the operating 2 days ago for TURBT and placement of bilateral ureteral stents as well as a Foley catheter.  Unfortunately, surgical pathology is consistent with high-grade invasive urothelial carcinoma involving the muscularis propria.  Is glandular differentiation.  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  This was an incomplete resection due to the extent of his disease.  His nephrostomy tubes were clamped postoperatively and a Foley catheter was placed.   BMP was drawn today for the purpose of evaluating double-J ureteral stent function.  The patient was noted to have significantly elevated blood sugar, 546 as well as elevated potassium of 5.6, bicarb 20 concerning for DKA.  He had no anion gap.   Today, the patient complains of penile and bladder pain.  He does not feel like his pain meds are sufficient.  His Foley catheter is draining well.  His nephrostomy tubes are clamped.  His energy levels actually improved postoperatively.  The patient is colonized with bacteria.  He is treated operatively with Keflex and received broad-spectrum double antibiotics intraoperatively.  He continues Keflex at this time.   PMH: Past Medical History:  Diagnosis Date  . Atrial fibrillation ([place])   . Cancer ([place])    a. 06/2011 b. s/p right hemicolectomy/ colon CA    . Colon polyps   . Dysrhythmia   . Hypertension   . Hyperthyroidism    a. 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 Stroke ([place]) 03/2012   a. residual right sided weakness  . Type 1 diabetes ([place])    a. on insulin b. prior DKA     Surgical History: Past Surgical History:  Procedure Laterality Date  . CARDIOVERSION N/A 03/25/2014   Procedure: CARDIOVERSION;  Surgeon: Lelon Perla, MD;  Location: [facility] ENDOSCOPY;  Service: Cardiovascular;  Laterality: N/A;  . COLONOSCOPY    . COLONOSCOPY WITH PROPOFOL N/A 07/24/2017   Procedure: COLONOSCOPY WITH PROPOFOL;  Surgeon: Toledo, Benay Pike, MD;  Location: [facility] ENDOSCOPY;  Service: Gastroenterology;  Laterality: N/A;  . CYSTOSCOPY W/ RETROGRADES Bilateral 10/23/2017   Procedure: CYSTOSCOPY WITH RETROGRADE PYELOGRAM;  Surgeon: Hollice Espy, MD;  Location: [facility] ORS;  Service: Urology;  Laterality: Bilateral;  . CYSTOSCOPY WITH RETROGRADE URETHROGRAM Bilateral 10/23/2017   Procedure: CYSTOSCOPY WITH antegrade nephrostogram;  Surgeon: Hollice Espy, MD;  Location: [facility] ORS;  Service: Urology;  Laterality: Bilateral;  . CYSTOSCOPY WITH STENT PLACEMENT Bilateral 10/23/2017   Procedure: CYSTOSCOPY WITH STENT PLACEMENT;  Surgeon: Hollice Espy, MD;  Location: [facility] ORS;  Service: Urology;  Laterality: Bilateral;  . HEMICOLECTOMY Right   . IR NEPHROSTOMY PLACEMENT LEFT  09/22/2017  . IR NEPHROSTOMY PLACEMENT RIGHT  09/22/2017  . TRANSURETHRAL RESECTION OF BLADDER TUMOR N/A 10/23/2017   Procedure: TRANSURETHRAL RESECTION OF BLADDER TUMOR (TURBT);  Surgeon: Hollice Espy, MD;  Location: [facility] ORS;  Service: Urology;  Laterality: N/A;    Home Medications:  Allergies as of 10/25/2017      Reactions   Amiodarone Other (See Comments)   Thyrotoxicosis per Murray Hodgkins, NP      Medication List        Accurate as of 10/25/17  2:53 PM. Always use your most recent med list.          acetaminophen 325 MG tablet Commonly known  as:  TYLENOL Take 2 tablets (650 mg total) by mouth every 6 (six) hours as needed for mild pain (or Fever >/= 101).   amiodarone 200 MG tablet Commonly known as:  PACERONE Take 200 mg by mouth daily.   ascorbic acid 250 MG tablet Commonly known as:  VITAMIN C Take 1 tablet (250 mg total) by mouth 2 (two) times daily.   Digoxin 62.5 MCG Tabs Take 0.0625 mg by mouth daily.   docusate sodium 100 MG capsule Commonly known as:  COLACE Take 1 capsule (100 mg total) by mouth 2 (two) times daily.   famotidine 20 MG tablet Commonly known as:  PEPCID Take 1 tablet (20 mg total) by mouth daily.   folic acid 1 MG tablet Commonly known as:  FOLVITE Take 1 mg by mouth daily.   HYDROcodone-acetaminophen 5-325 MG tablet Commonly known as:  NORCO/VICODIN Take 1-2 tablets by mouth every 6 (six) hours as needed for moderate pain.   insulin aspart 100 UNIT/ML injection Commonly known as:  novoLOG Inject 10 Units into the skin 3 (three) times daily with meals.   insulin aspart 100 UNIT/ML injection Commonly known as:  novoLOG Inject 0-15 Units into the skin 4 (four) times daily -  before meals and at bedtime. CBG < 70: implement hypoglycemia protocol CBG 70 - 120: 0 units CBG 121 - 150: 2 units CBG 151 - 200: 3 units CBG 201 - 250: 5 units CBG 251 - 300: 8 units CBG 301 - 350: 11 units CBG 351 - 400: 15 units CBG > 400: call MD and obtain STAT lab verification   insulin glargine 100 UNIT/ML injection Commonly known as:  LANTUS Inject 0.37 mLs (37 Units total) into the skin daily.   loratadine 10 MG tablet Commonly known as:  CLARITIN Take 1 tablet (10 mg total) by mouth daily.   metoprolol tartrate 25 MG tablet Commonly known as:  LOPRESSOR Take 1 tablet (25 mg total) by mouth 2 (two) times daily.   multivitamin with minerals Tabs tablet Take 1 tablet by mouth daily.   nitrofurantoin (macrocrystal-monohydrate) 100 MG capsule Commonly known as:  MACROBID Take 100 mg by mouth  2 (two) times daily.   oxybutynin 5 MG tablet Commonly known as:  DITROPAN Take 1 tablet (5 mg total) by mouth every 8 (eight) hours as needed for bladder spasms.   protein supplement shake Liqd Commonly known as:  PREMIER PROTEIN Take 325 mLs (11 oz total) by mouth 2 (two) times daily between meals.   Rivaroxaban 15 MG Tabs tablet Commonly known as:  XARELTO Take 15 mg by mouth daily.   rosuvastatin 5 MG tablet Commonly known as:  CRESTOR Take 1 tablet (5 mg total) by mouth daily at 6 PM.   tamsulosin 0.4 MG Caps capsule Commonly known as:  FLOMAX Take 1 capsule (0.4 mg total) by mouth daily after supper.       Allergies:  Allergies  Allergen Reactions  . Amiodarone Other (See Comments)    Thyrotoxicosis per Murray Hodgkins, NP    Family  History: Family History  Problem Relation Age of Onset  . Heart attack Mother     Social History:  reports that he quit smoking about 44 years ago. He has never used smokeless tobacco. He reports that he does not drink alcohol or use drugs.  ROS: UROLOGY Frequent Urination?: Yes Hard to postpone urination?: No Burning/pain with urination?: No Get up at night to urinate?: Yes Leakage of urine?: No Urine stream starts and stops?: No Trouble starting stream?: No Do you have to strain to urinate?: No Blood in urine?: No Urinary tract infection?: No Sexually transmitted disease?: No Injury to kidneys or bladder?: No Painful intercourse?: No Weak stream?: No Erection problems?: No Penile pain?: Yes  Gastrointestinal Nausea?: No Vomiting?: No Indigestion/heartburn?: No Diarrhea?: No Constipation?: Yes  Constitutional Fever: No Night sweats?: No Weight loss?: No Fatigue?: No  Skin Skin rash/lesions?: No Itching?: No  Eyes Blurred vision?: No Double vision?: No  Ears/Nose/Throat Sore throat?: No Sinus problems?: Yes  Hematologic/Lymphatic Swollen glands?: No Easy bruising?: Yes  Cardiovascular Leg  swelling?: No Chest pain?: No  Respiratory Cough?: No Shortness of breath?: Yes  Endocrine Excessive thirst?: Yes  Musculoskeletal Back pain?: No Joint pain?: No  Neurological Headaches?: No Dizziness?: No  Psychologic Depression?: Yes Anxiety?: Yes  Physical Exam: BP (!) 160/73   Pulse 87   Ht 5\' 10"  (1.778 m)   BMI 22.84 kg/m   Constitutional:  Alert and oriented, No acute distress.  Alert and oriented today, more awake than on previous occasions.  Sister present.  In wheelchair. HEENT: [place] AT, moist mucus membranes.  Trachea midline, no masses. Cardiovascular: No clubbing, cyanosis, or edema. Respiratory: Normal respiratory effort, no increased work of breathing. GI: Abdomen is soft, nontender, nondistended, no abdominal masses GU: Lateral nephrostomy tubes capped.  Foley catheter draining slightly cloudy yellow urine. Skin: No rashes, bruises or suspicious lesions. Neurologic: Grossly intact, no focal deficits, moving all 4 extremities. Psychiatric: Normal mood and affect.  Laboratory Data: Component     Latest Ref Rng & Units 10/25/2017          Sodium     135 - 145 mmol/L 130 (L)  Potassium     3.5 - 5.1 mmol/L 5.6 (H)  Chloride     98 - 111 mmol/L 98  CO2     22 - 32 mmol/L 20 (L)  Glucose     70 - 99 mg/dL 546 (HH)  BUN     8 - 23 mg/dL 36 (H)  Creatinine     0.61 - 1.24 mg/dL 2.22 (H)  Calcium     8.9 - 10.3 mg/dL 8.4 (L)  GFR, Est Non African American     >60 mL/min 29 (L)  GFR, Est African American     >60 mL/min 34 (L)  Anion gap     5 - 15 12    Urinalysis N/a  Pertinent Imaging: NA  Assessment & Plan:    1. Malignant neoplasm of overlapping sites of bladder [facility]) Newly diagnosed muscle invasive bladder cancer with bilateral hydroureteronephrosis Noncontrast CT scan without evidence of metastatic disease Will need chest CT for further staging Plan for referral to medical oncology Will discuss next week at tumor board Given  the patient's multiple medical comorbidities and recent severe illness, he may not be able to tolerate neoadjuvant chemotherapy and may or may not be a surgical candidate for cystectomy We will address the below issues and then decide on a treatment plan - CT  CHEST WO CONTRAST; Future - Ambulatory referral to Oncology  2. Hydronephrosis, unspecified hydronephrosis type Currently with bilateral percutaneous nephrostomy tubes which are clamped as well as indwelling double-J stents High risk for stent failure Creatinine is relatively stable despite nephrostomy tube clamping If the patient were otherwise well today, I would remove his nephrostomy tubes, however, given his DKA and hyperkalemia, will leave them in place for the time being as well as Foley  If he is admitted to the hospital, we can slowly start to remove his nephrostomy tubes and Foley and continue to monitor his renal function as an inpatient  3. Type 1 diabetes mellitus with ketoacidosis without coma (HCC) Markely elevated blood sugar today Case was discussed with Dr. Vianne Bulls and inpatient hospitalist who recommends evaluation in the ER to triage whether or not the patient needs ICU level care with insulin drip  4. Hyperkalemia To ED as above  5. Bladder pain Poorly controlled bladder pain We will work to remove Foley catheter once his medical issues are stabilized While inpatient, he would benefit from BNO suppository and more sufficient pain control   Hollice Espy, MD  [place] [address] [place], [place] [address] [phone]   I spent 40 min with this patient of which greater than 50% was spent in counseling and coordination of care with the patient.

## 2017-10-26 ENCOUNTER — Other Ambulatory Visit: Payer: Self-pay

## 2017-10-26 DIAGNOSIS — D494 Neoplasm of unspecified behavior of bladder: Secondary | ICD-10-CM

## 2017-10-26 DIAGNOSIS — N1330 Unspecified hydronephrosis: Secondary | ICD-10-CM

## 2017-10-26 DIAGNOSIS — D72829 Elevated white blood cell count, unspecified: Secondary | ICD-10-CM

## 2017-10-26 DIAGNOSIS — R739 Hyperglycemia, unspecified: Secondary | ICD-10-CM

## 2017-10-26 LAB — BASIC METABOLIC PANEL
Anion gap: 10 (ref 5–15)
BUN: 35 mg/dL — AB (ref 8–23)
CALCIUM: 8.5 mg/dL — AB (ref 8.9–10.3)
CHLORIDE: 105 mmol/L (ref 98–111)
CO2: 20 mmol/L — AB (ref 22–32)
Creatinine, Ser: 2.25 mg/dL — ABNORMAL HIGH (ref 0.61–1.24)
GFR calc Af Amer: 33 mL/min — ABNORMAL LOW (ref >60)
GFR calc non Af Amer: 28 mL/min — ABNORMAL LOW (ref >60)
GLUCOSE: 405 mg/dL — AB (ref 70–99)
Potassium: 4.5 mmol/L (ref 3.5–5.1)
Sodium: 135 mmol/L (ref 135–145)

## 2017-10-26 LAB — GLUCOSE, CAPILLARY
Glucose-Capillary: 381 mg/dL — ABNORMAL HIGH (ref 70–99)
Glucose-Capillary: 228 mg/dL — ABNORMAL HIGH (ref 70–99)
Glucose-Capillary: 184 mg/dL — ABNORMAL HIGH (ref 70–99)
Glucose-Capillary: 346 mg/dL — ABNORMAL HIGH (ref 70–99)
Glucose-Capillary: 400 mg/dL — ABNORMAL HIGH (ref 70–99)

## 2017-10-26 LAB — MRSA PCR SCREENING: MRSA BY PCR: NEGATIVE

## 2017-10-26 LAB — CBC
HCT: 24.6 % — ABNORMAL LOW (ref 40.0–52.0)
Hemoglobin: 8.5 g/dL — ABNORMAL LOW (ref 13.0–18.0)
MCH: 30.8 pg (ref 26.0–34.0)
MCHC: 34.6 g/dL (ref 32.0–36.0)
MCV: 88.9 fL (ref 80.0–100.0)
PLATELETS: 573 10*3/uL — AB (ref 150–440)
RBC: 2.76 MIL/uL — AB (ref 4.40–5.90)
RDW: 14.3 % (ref 11.5–14.5)
WBC: 12.3 10*3/uL — ABNORMAL HIGH (ref 3.8–10.6)

## 2017-10-26 LAB — LACTIC ACID, PLASMA: LACTIC ACID, VENOUS: 1.4 mmol/L (ref 0.5–1.9)

## 2017-10-26 MED ORDER — LORATADINE 10 MG PO TABS
10.0000 mg | ORAL_TABLET | Freq: Every day | ORAL | Status: DC
  Administered 2017-10-26 – 2017-10-28 (×3): 10 mg via ORAL
  Filled 2017-10-26 (×3): qty 1

## 2017-10-26 MED ORDER — INSULIN GLARGINE 100 UNIT/ML ~~LOC~~ SOLN
20.0000 [IU] | Freq: Every day | SUBCUTANEOUS | Status: DC
  Administered 2017-10-26: 20 [IU] via SUBCUTANEOUS
  Filled 2017-10-26 (×2): qty 0.2

## 2017-10-26 MED ORDER — INSULIN ASPART 100 UNIT/ML ~~LOC~~ SOLN
0.0000 [IU] | Freq: Three times a day (TID) | SUBCUTANEOUS | Status: DC
  Administered 2017-10-26: 3 [IU] via SUBCUTANEOUS
  Administered 2017-10-26: 11 [IU] via SUBCUTANEOUS
  Administered 2017-10-26: 5 [IU] via SUBCUTANEOUS
  Administered 2017-10-27: 11 [IU] via SUBCUTANEOUS
  Administered 2017-10-27: 5 [IU] via SUBCUTANEOUS
  Administered 2017-10-27: 8 [IU] via SUBCUTANEOUS
  Administered 2017-10-28 (×3): 5 [IU] via SUBCUTANEOUS
  Filled 2017-10-26 (×9): qty 1

## 2017-10-26 MED ORDER — PREMIER PROTEIN SHAKE
11.0000 [oz_av] | Freq: Two times a day (BID) | ORAL | Status: DC
  Administered 2017-10-26 – 2017-10-28 (×6): 11 [oz_av] via ORAL

## 2017-10-26 MED ORDER — ONDANSETRON HCL 4 MG/2ML IJ SOLN: 4.0000 mg | Freq: Four times a day (QID) | INTRAMUSCULAR | Status: DC | PRN

## 2017-10-26 MED ORDER — DOCUSATE SODIUM 100 MG PO CAPS
100.0000 mg | ORAL_CAPSULE | Freq: Two times a day (BID) | ORAL | Status: DC
  Administered 2017-10-26 – 2017-10-28 (×5): 100 mg via ORAL
  Filled 2017-10-26 (×5): qty 1

## 2017-10-26 MED ORDER — HYDROCODONE-ACETAMINOPHEN 5-325 MG PO TABS: 1.0000 | ORAL_TABLET | Freq: Four times a day (QID) | ORAL | Status: DC | PRN

## 2017-10-26 MED ORDER — AMIODARONE HCL 200 MG PO TABS
200.0000 mg | ORAL_TABLET | Freq: Every day | ORAL | Status: DC
  Administered 2017-10-26 – 2017-10-28 (×3): 200 mg via ORAL
  Filled 2017-10-26 (×3): qty 1

## 2017-10-26 MED ORDER — ROSUVASTATIN CALCIUM 10 MG PO TABS
5.0000 mg | ORAL_TABLET | Freq: Every day | ORAL | Status: DC
  Administered 2017-10-26 – 2017-10-27 (×2): 5 mg via ORAL
  Filled 2017-10-26 (×2): qty 1

## 2017-10-26 MED ORDER — VITAMIN C 500 MG PO TABS
250.0000 mg | ORAL_TABLET | Freq: Two times a day (BID) | ORAL | Status: DC
  Administered 2017-10-26 – 2017-10-28 (×5): 250 mg via ORAL
  Filled 2017-10-26 (×5): qty 1

## 2017-10-26 MED ORDER — SODIUM CHLORIDE 0.9 % IV SOLN
INTRAVENOUS | Status: AC
  Administered 2017-10-26: 01:00:00 via INTRAVENOUS

## 2017-10-26 MED ORDER — PATIROMER SORBITEX CALCIUM 8.4 G PO PACK
8.4000 g | PACK | Freq: Once | ORAL | Status: AC
  Administered 2017-10-26: 8.4 g via ORAL
  Filled 2017-10-26: qty 1

## 2017-10-26 MED ORDER — ONDANSETRON HCL 4 MG PO TABS: 4.0000 mg | ORAL_TABLET | Freq: Four times a day (QID) | ORAL | Status: DC | PRN

## 2017-10-26 MED ORDER — ACETAMINOPHEN 650 MG RE SUPP: 650.0000 mg | Freq: Four times a day (QID) | RECTAL | Status: DC | PRN

## 2017-10-26 MED ORDER — METOPROLOL TARTRATE 25 MG PO TABS
25.0000 mg | ORAL_TABLET | Freq: Two times a day (BID) | ORAL | Status: DC
  Administered 2017-10-26 – 2017-10-28 (×6): 25 mg via ORAL
  Filled 2017-10-26 (×6): qty 1

## 2017-10-26 MED ORDER — OXYBUTYNIN CHLORIDE 5 MG PO TABS
5.0000 mg | ORAL_TABLET | Freq: Three times a day (TID) | ORAL | Status: DC | PRN
  Administered 2017-10-26: 5 mg via ORAL
  Filled 2017-10-26: qty 1

## 2017-10-26 MED ORDER — INFLUENZA VAC SPLIT QUAD 0.5 ML IM SUSY
0.5000 mL | PREFILLED_SYRINGE | INTRAMUSCULAR | Status: AC
  Administered 2017-10-27: 0.5 mL via INTRAMUSCULAR
  Filled 2017-10-26: qty 0.5

## 2017-10-26 MED ORDER — INSULIN GLARGINE 100 UNIT/ML ~~LOC~~ SOLN
20.0000 [IU] | Freq: Two times a day (BID) | SUBCUTANEOUS | Status: DC
  Administered 2017-10-26 – 2017-10-28 (×4): 20 [IU] via SUBCUTANEOUS
  Filled 2017-10-26 (×6): qty 0.2

## 2017-10-26 MED ORDER — INSULIN ASPART 100 UNIT/ML ~~LOC~~ SOLN
0.0000 [IU] | Freq: Every day | SUBCUTANEOUS | Status: DC
  Administered 2017-10-26 – 2017-10-27 (×3): 5 [IU] via SUBCUTANEOUS
  Filled 2017-10-26 (×3): qty 1

## 2017-10-26 MED ORDER — ADULT MULTIVITAMIN W/MINERALS CH
1.0000 | ORAL_TABLET | Freq: Every day | ORAL | Status: DC
  Administered 2017-10-26 – 2017-10-28 (×3): 1 via ORAL
  Filled 2017-10-26 (×3): qty 1

## 2017-10-26 MED ORDER — TAMSULOSIN HCL 0.4 MG PO CAPS
0.4000 mg | ORAL_CAPSULE | Freq: Every day | ORAL | Status: DC
  Administered 2017-10-26 – 2017-10-27 (×2): 0.4 mg via ORAL
  Filled 2017-10-26 (×2): qty 1

## 2017-10-26 MED ORDER — FOLIC ACID 1 MG PO TABS
1.0000 mg | ORAL_TABLET | Freq: Every day | ORAL | Status: DC
  Administered 2017-10-26 – 2017-10-28 (×3): 1 mg via ORAL
  Filled 2017-10-26 (×3): qty 1

## 2017-10-26 MED ORDER — POLYETHYLENE GLYCOL 3350 17 G PO PACK: 17.0000 g | PACK | Freq: Every day | ORAL | Status: DC | PRN

## 2017-10-26 MED ORDER — ENOXAPARIN SODIUM 40 MG/0.4ML ~~LOC~~ SOLN
40.0000 mg | SUBCUTANEOUS | Status: DC
  Administered 2017-10-26: 40 mg via SUBCUTANEOUS
  Filled 2017-10-26: qty 0.4

## 2017-10-26 MED ORDER — HYDRALAZINE HCL 20 MG/ML IJ SOLN: 5.0000 mg | INTRAMUSCULAR | Status: DC | PRN

## 2017-10-26 MED ORDER — DIGOXIN 125 MCG PO TABS
0.0625 mg | ORAL_TABLET | Freq: Every day | ORAL | Status: DC
  Administered 2017-10-26 – 2017-10-28 (×3): 0.0625 mg via ORAL
  Filled 2017-10-26 (×3): qty 0.5

## 2017-10-26 MED ORDER — ACETAMINOPHEN 325 MG PO TABS: 650.0000 mg | ORAL_TABLET | Freq: Four times a day (QID) | ORAL | Status: DC | PRN

## 2017-10-26 MED ORDER — FAMOTIDINE 20 MG PO TABS
20.0000 mg | ORAL_TABLET | Freq: Every day | ORAL | Status: DC
  Administered 2017-10-26 – 2017-10-28 (×3): 20 mg via ORAL
  Filled 2017-10-26 (×3): qty 1

## 2017-10-26 NOTE — Progress Notes (Signed)
[place] [facility] [place] NAME: Jordan [name]    MR#:  [id number]  DATE OF BIRTH:  11/04/1950  SUBJECTIVE:  CHIEF COMPLAINT:   Chief Complaint  Patient presents with  . Hyperglycemia  Patient seen and evaluated today No fever No complaints of any shortness of breath no chest pain  REVIEW OF SYSTEMS:    ROS  CONSTITUTIONAL: No documented fever. No fatigue, weakness. No weight gain, no weight loss.  EYES: No blurry or double vision.  ENT: No tinnitus. No postnasal drip. No redness of the oropharynx.  RESPIRATORY: No cough, no wheeze, no hemoptysis. No dyspnea.  CARDIOVASCULAR: No chest pain. No orthopnea. No palpitations. No syncope.  GASTROINTESTINAL: No nausea, no vomiting or diarrhea. No abdominal pain. No melena or hematochezia.  GENITOURINARY: No dysuria or hematuria.  Has bilateral nephrostomy tubes ENDOCRINE: No polyuria or nocturia. No heat or cold intolerance.  HEMATOLOGY: No anemia. No bruising. No bleeding.  INTEGUMENTARY: No rashes. No lesions.  MUSCULOSKELETAL: No arthritis. No swelling. No gout.  NEUROLOGIC: No numbness, tingling, or ataxia. No seizure-type activity.  PSYCHIATRIC: No anxiety. No insomnia. No ADD.   DRUG ALLERGIES:   Allergies  Allergen Reactions  . Amiodarone Other (See Comments)    Thyrotoxicosis per Murray Hodgkins, NP    VITALS:  Blood pressure 135/60, pulse 80, temperature 99.5 F (37.5 C), temperature source Oral, resp. rate 16, height 5\' 10"  (1.778 m), weight 71.3 kg, SpO2 99 %.  PHYSICAL EXAMINATION:   Physical Exam  GENERAL:  67 y.o.-year-old patient lying in the bed with no acute distress.  EYES: Pupils equal, round, reactive to light and accommodation. No scleral icterus. Extraocular muscles intact.  HEENT: Head atraumatic, normocephalic. Oropharynx and nasopharynx clear.  NECK:  Supple, no jugular venous distention. No thyroid enlargement, no tenderness.  LUNGS: Normal breath sounds  bilaterally, no wheezing, rales, rhonchi. No use of accessory muscles of respiration.  CARDIOVASCULAR: S1, S2 normal. No murmurs, rubs, or gallops.  ABDOMEN: Soft, nontender, nondistended. Bowel sounds present. No organomegaly or mass.  EXTREMITIES: No cyanosis, clubbing or edema b/l.    NEUROLOGIC: Cranial nerves II through XII are intact. No focal Motor or sensory deficits b/l.   PSYCHIATRIC: The patient is alert and oriented x 3.  SKIN: No obvious rash, lesion, or ulcer.   LABORATORY PANEL:   CBC Recent Labs  Lab 10/26/17 0101  WBC 12.3*  HGB 8.5*  HCT 24.6*  PLT 573*   ------------------------------------------------------------------------------------------------------------------ Chemistries  Recent Labs  Lab 10/26/17 0101  NA 135  K 4.5  CL 105  CO2 20*  GLUCOSE 405*  BUN 35*  CREATININE 2.25*  CALCIUM 8.5*   ------------------------------------------------------------------------------------------------------------------  Cardiac Enzymes Recent Labs  Lab 10/25/17 1607  TROPONINI <0.03   ------------------------------------------------------------------------------------------------------------------  RADIOLOGY:  Dg Chest Port 1 View  Result Date: 10/25/2017 CLINICAL DATA:  Hyperglycemia. Hyperkalemia. Recent acute renal failure. EXAM: PORTABLE CHEST 1 VIEW COMPARISON:  09/26/2017 and 09/23/2017 radiographs. FINDINGS: 1834 hour. The heart size and mediastinal contours are normal. The lungs are clear. There is no pleural effusion or pneumothorax. No acute osseous findings are identified. There are probable bilateral ureteral stents. IMPRESSION: No active cardiopulmonary process. Electronically Signed   By: Richardean Sale M.D.   On: 10/25/2017 18:52     ASSESSMENT AND PLAN:   67 year old male patient with history of diabetes mellitus type 2, hypertension, paroxysmal atrial fibrillation, colon cancer, hemicolectomy, bladder cancer, bilateral nephrostomy tubes  placement for ureteral obstruction currently under service  for urinary tract infection  -Complicated urinary tract infection Leukocytosis improving Continue IV Zosyn antibiotic Follow-up urine cultures  -High-grade invasive urothelial carcinoma Recent TURBT with bilateral ureteral stent placements and bilateral nephrostomy tubes Monitor renal function Urology follow-up Continue oxybutynin and Flomax Oncology follow-up as outpatient  -Hyperglycemia Not in diabetic ketoacidosis Sliding scale coverage with insulin with Lantus coverage Increase dose of lantus insulin to 20 untis [place] BID  -Hyperkalemia improved  -Paroxysmal atrial fibrillation Currently in sinus rhythm Continue oral amiodarone and metoprolol Xarelto on hold  All the records are reviewed and case discussed with Care Management/Social Worker. Management plans discussed with the patient, family and they are in agreement.  CODE STATUS: Full code  DVT Prophylaxis: SCDs  TOTAL TIME TAKING CARE OF THIS PATIENT: 35 minutes.   POSSIBLE D/C IN 2 to 3 DAYS, DEPENDING ON CLINICAL CONDITION.  Saundra Shelling M.D on 10/26/2017 at 12:13 PM  Between 7am to 6pm - Pager - [phone]  After 6pm go to [email or web address] - password [email or web address] [facility]  Office  [phone]  CC: Primary care physician; Albina Billet, MD  Note: This dictation was prepared with Dragon dictation along with smaller phrase technology. Any transcriptional errors that result from this process are unintentional.

## 2017-10-26 NOTE — NC FL2 (Signed)
[place] LEVEL OF CARE SCREENING TOOL     IDENTIFICATION  Patient Name: Jordan [name] Birthdate: May 22, 1950 Sex: male Admission Date (Current Location): 10/25/2017  [place] and [place] Number:  Engineering geologist and Address:  [facility], [address], [place], [place] [address]      Provider Number: [id number]  Attending Physician Name and Address:  Saundra Shelling, MD  Relative Name and Phone Number:  Basilia Jumbo (Sister) [phone], Al Pimple (Sister) [phone], Rawley Harju [place]) [phone]    Current Level of Care: Hospital Recommended Level of Care: [place] Prior Approval Number:    Date Approved/Denied:   PASRR Number: [id number]  Discharge Plan: SNF    Current Diagnoses: Patient Active Problem List   Diagnosis Date Noted  . Urinary tract infection 10/25/2017  . Acute kidney injury ([place]) 09/21/2017  . Hyperthyroidism 03/30/2014  . Type I diabetes mellitus ([place]) 03/30/2014  . Hyperglycemia 03/30/2014  . Thyrotoxicosis 03/30/2014  . History of stroke 03/30/2014  . Malnutrition of moderate degree ([place]) 03/28/2014  . Atrial fibrillation with rapid ventricular response (HCC) 03/19/2014    Orientation RESPIRATION BLADDER Height & Weight     Self, Time, Situation, Place  Normal Indwelling catheter Weight: 157 lb 3 oz (71.3 kg) Height:  5\' 10"  (177.8 cm)  BEHAVIORAL SYMPTOMS/MOOD NEUROLOGICAL BOWEL NUTRITION STATUS      Continent Diet(Heart healthy/Carb modified)  AMBULATORY STATUS COMMUNICATION OF NEEDS Skin   Extensive Assist Verbally Normal                       Personal Care Assistance Level of Assistance  Bathing, Feeding, Dressing Bathing Assistance: Limited assistance Feeding assistance: Independent Dressing Assistance: Limited assistance     Functional Limitations Info  Sight, Hearing, Speech Sight Info: Adequate Hearing Info: Adequate Speech Info: Adequate     SPECIAL CARE FACTORS FREQUENCY  PT (By licensed PT)     PT Frequency: Up to 5X per week              Contractures Contractures Info: Not present    Additional Factors Info  Code Status, Allergies Code Status Info: Full Allergies Info: Amiodarone           Current Medications (10/26/2017):  This is the current hospital active medication list Current Facility-Administered Medications  Medication Dose Route Frequency Provider Last Rate Last Dose  . acetaminophen (TYLENOL) tablet 650 mg  650 mg Oral Q6H PRN Mayo, Pete Pelt, MD       Or  . acetaminophen (TYLENOL) suppository 650 mg  650 mg Rectal Q6H PRN Mayo, Pete Pelt, MD      . amiodarone (PACERONE) tablet 200 mg  200 mg Oral Daily Mayo, Pete Pelt, MD   200 mg at 10/26/17 0958  . digoxin (LANOXIN) tablet 0.0625 mg  0.0625 mg Oral Daily Mayo, Pete Pelt, MD   0.0625 mg at 10/26/17 0958  . docusate sodium (COLACE) capsule 100 mg  100 mg Oral BID Sela Hua, MD   100 mg at 10/26/17 0958  . enoxaparin (LOVENOX) injection 40 mg  40 mg Subcutaneous Q24H Mayo, Pete Pelt, MD      . famotidine (PEPCID) tablet 20 mg  20 mg Oral Daily Mayo, Pete Pelt, MD   20 mg at 10/26/17 0958  . folic acid (FOLVITE) tablet 1 mg  1 mg Oral Daily Mayo, Pete Pelt, MD   1 mg at 10/26/17 0958  . hydrALAZINE (APRESOLINE) injection  5 mg  5 mg Intravenous Q4H PRN Mayo, Pete Pelt, MD      . HYDROcodone-acetaminophen (NORCO/VICODIN) 5-325 MG per tablet 1-2 tablet  1-2 tablet Oral Q6H PRN Mayo, Pete Pelt, MD      . Derrill Memo ON 10/27/2017] Influenza vac split quadrivalent PF (FLUARIX) injection 0.5 mL  0.5 mL Intramuscular Tomorrow-1000 Mayo, Pete Pelt, MD      . insulin aspart (novoLOG) injection 0-15 Units  0-15 Units Subcutaneous TID WC Mayo, Pete Pelt, MD   3 Units at 10/26/17 1159  . insulin aspart (novoLOG) injection 0-5 Units  0-5 Units Subcutaneous QHS Mayo, Pete Pelt, MD   5 Units at 10/26/17 0130  . insulin glargine (LANTUS) injection 20 Units  20  Units Subcutaneous BID Pyreddy, [place], MD      . loratadine (CLARITIN) tablet 10 mg  10 mg Oral Daily Mayo, Pete Pelt, MD   10 mg at 10/26/17 0958  . metoprolol tartrate (LOPRESSOR) tablet 25 mg  25 mg Oral BID Sela Hua, MD   25 mg at 10/26/17 0958  . multivitamin with minerals tablet 1 tablet  1 tablet Oral Daily Mayo, Pete Pelt, MD   1 tablet at 10/26/17 [phone]  . ondansetron (ZOFRAN) tablet 4 mg  4 mg Oral Q6H PRN Mayo, Pete Pelt, MD       Or  . ondansetron [facility]) injection 4 mg  4 mg Intravenous Q6H PRN Mayo, Pete Pelt, MD      . oxybutynin (DITROPAN) tablet 5 mg  5 mg Oral Q8H PRN Mayo, Pete Pelt, MD   5 mg at 10/26/17 0129  . piperacillin-tazobactam (ZOSYN) IVPB 3.375 g  3.375 g Intravenous Q8H Mayo, Pete Pelt, MD   Stopped at 10/26/17 1325  . polyethylene glycol (MIRALAX / GLYCOLAX) packet 17 g  17 g Oral Daily PRN Mayo, Pete Pelt, MD      . protein supplement (PREMIER PROTEIN) liquid - approved for s/p bariatric surgery  11 oz Oral BID BM Mayo, Pete Pelt, MD   11 oz at 10/26/17 1446  . rosuvastatin (CRESTOR) tablet 5 mg  5 mg Oral q1800 Mayo, Pete Pelt, MD      . tamsulosin (FLOMAX) capsule 0.4 mg  0.4 mg Oral QPC supper Mayo, Pete Pelt, MD      . vitamin C (ASCORBIC ACID) tablet 250 mg  250 mg Oral BID Sela Hua, MD   250 mg at 10/26/17 5883     Discharge Medications: Please see discharge summary for a list of discharge medications.  Relevant Imaging Results:  Relevant Lab Results:   Additional Information [phone]  Zettie Pho, LCSW

## 2017-10-26 NOTE — Clinical Social Work Note (Signed)
Clinical Social Work Assessment  Patient Details  Name: Jordan [name] MRN: [id number] Date of Birth: Feb 02, 1951  Date of referral:  10/26/17               Reason for consult:  Facility Placement                Permission sought to share information with:  Chartered certified accountant granted to share information::  Yes, Verbal Permission Granted  Name::        Agency::  [facility]  Relationship::     Contact Information:     Housing/Transportation Living arrangements for the past 2 months:  Mobile Home, [place] of Information:  Patient, Medical Team Patient Interpreter Needed:  None Criminal Activity/Legal Involvement Pertinent to Current Situation/Hospitalization:  No - Comment as needed Significant Relationships:  Warehouse manager, Siblings Lives with:  Self Do you feel safe going back to the place where you live?  Yes Need for family participation in patient care:  No (Coment)  Care giving concerns: Patient admitted from [facility]  Social Worker assessment / plan:  The CSW met with the patient at bedside to discuss discharge planning. The patient confirmed that he has been a short term rehab resident at [facility] and would like to return when stable. The CSW advised the [facility] would have to [place]. The patient shared that he had no further questions. The patient should discharge Monday or Tuesday pending medical clearance. CSW will confirm with [facility] that the patient can return and facilitate discharge.    Employment status:  Retired Nurse, adult PT Recommendations:  Not assessed at this time Information / Referral to community resources:     Patient/Family's Response to care:  The patient thanked the CSW Patient/Family's Understanding of and Emotional Response to Diagnosis, Current Treatment, and Prognosis:  The patient understands and agrees with the discharge plan.  Emotional  Assessment Appearance:  Appears stated age Attitude/Demeanor/Rapport:  Engaged Affect (typically observed):  Pleasant Orientation:  Oriented to Self, Oriented to Place, Oriented to  Time, Oriented to Situation Alcohol / Substance use:  Never Used Psych involvement (Current and /or in the community):  No (Comment)  Discharge Needs  Concerns to be addressed:  Discharge Planning Concerns, Care Coordination Readmission within the last 30 days:  Yes Current discharge risk:  Chronically ill Barriers to Discharge:  Continued Medical Work up   [employer], LCSW 10/26/2017, 5:04 PM

## 2017-10-26 NOTE — Consult Note (Signed)
Consultation: Leukocytosis, elevated blood sugars Requested by: Sela Hua, MD  History of Present Illness: Jordan [name] is a 67 year old white male recently diagnosed with a bladder mass and bilateral hydronephrosis.  He had bilateral PERC tubes placed and then was taken for TURBT and ureteral stenting 3 days ago.  Of note, his nephrostomy tubes were clamped at that time.  He was having bladder pain last night and came to the emergency department and was noted to have a white count of 20 and elevated blood sugars.  Dr. Burlene Arnt noted the patient's catheter was pulled tight and straight and he put some slack back in the line in the patient's bladder pain improved.  Blood and urine cultures were obtained in case of infection.  Patient's last urine culture grew Klebsiella and he was on cephalexin at home which should have provided adequate coverage.  He is feeling well today.  He has excellent urine output with almost 3 L of urine.  He is on Zosyn. No fever and normal wbc went from 20 down to 12. His creatinine is 2.25 which is about his baseline.  Creatinine was down to 1.9 when the nephrostomy tubes were opened.  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  I reviewed the patient's most recent CT scan images.  Past Medical History:  Diagnosis Date  . Atrial fibrillation ([place])   . Cancer ([place])    a. 06/2011 b. s/p right hemicolectomy/ colon CA  . Colon polyps   . Dysrhythmia   . Hypertension   . Hyperthyroidism    a. 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 Stroke ([place]) 03/2012   a. residual right sided weakness  . Type 1 diabetes ([place])    a. on insulin b. prior DKA    Past Surgical History:  Procedure Laterality Date  . CARDIOVERSION N/A 03/25/2014   Procedure: CARDIOVERSION;  Surgeon: Lelon Perla, MD;  Location: [facility] ENDOSCOPY;  Service: Cardiovascular;  Laterality: N/A;  . COLONOSCOPY    . COLONOSCOPY WITH PROPOFOL N/A 07/24/2017   Procedure:  COLONOSCOPY WITH PROPOFOL;  Surgeon: Toledo, Benay Pike, MD;  Location: [facility] ENDOSCOPY;  Service: Gastroenterology;  Laterality: N/A;  . CYSTOSCOPY W/ RETROGRADES Bilateral 10/23/2017   Procedure: CYSTOSCOPY WITH RETROGRADE PYELOGRAM;  Surgeon: Hollice Espy, MD;  Location: [facility] ORS;  Service: Urology;  Laterality: Bilateral;  . CYSTOSCOPY WITH RETROGRADE URETHROGRAM Bilateral 10/23/2017   Procedure: CYSTOSCOPY WITH antegrade nephrostogram;  Surgeon: Hollice Espy, MD;  Location: [facility] ORS;  Service: Urology;  Laterality: Bilateral;  . CYSTOSCOPY WITH STENT PLACEMENT Bilateral 10/23/2017   Procedure: CYSTOSCOPY WITH STENT PLACEMENT;  Surgeon: Hollice Espy, MD;  Location: [facility] ORS;  Service: Urology;  Laterality: Bilateral;  . HEMICOLECTOMY Right   . IR NEPHROSTOMY PLACEMENT LEFT  09/22/2017  . IR NEPHROSTOMY PLACEMENT RIGHT  09/22/2017  . TRANSURETHRAL RESECTION OF BLADDER TUMOR N/A 10/23/2017   Procedure: TRANSURETHRAL RESECTION OF BLADDER TUMOR (TURBT);  Surgeon: Hollice Espy, MD;  Location: [facility] ORS;  Service: Urology;  Laterality: N/A;    Home Medications:  Medications Prior to Admission  Medication Sig Dispense Refill Last Dose  . acetaminophen (TYLENOL) 325 MG tablet Take 2 tablets (650 mg total) by mouth every 6 (six) hours as needed for mild pain (or Fever >/= 101).   Unknown at PRN  . amiodarone (PACERONE) 200 MG tablet Take 200 mg by mouth daily.  0 10/25/2017 at 0900  . digoxin 62.5 MCG TABS Take 0.0625 mg  by mouth daily.   10/25/2017 at 0600  . docusate sodium (COLACE) 100 MG capsule Take 1 capsule (100 mg total) by mouth 2 (two) times daily. 60 capsule 0 10/25/2017 at 0800  . famotidine (PEPCID) 20 MG tablet Take 1 tablet (20 mg total) by mouth daily.   10/25/2017 at 0600  . folic acid (FOLVITE) 1 MG tablet Take 1 mg by mouth daily.   10/25/2017 at 0900  . HYDROcodone-acetaminophen (NORCO/VICODIN) 5-325 MG tablet Take 1-2 tablets by mouth every 6 (six) hours as needed for moderate  pain. 10 tablet 0 Unknown at PRN  . insulin aspart (NOVOLOG) 100 UNIT/ML injection Inject 10 Units into the skin 3 (three) times daily with meals. (Patient taking differently: Inject 18 Units into the skin 3 (three) times daily with meals. ) 10 mL 11 10/25/2017 at 0730  . insulin aspart (NOVOLOG) 100 UNIT/ML injection Inject 0-15 Units into the skin 4 (four) times daily -  before meals and at bedtime. CBG < 70: implement hypoglycemia protocol CBG 70 - 120: 0 units CBG 121 - 150: 2 units CBG 151 - 200: 3 units CBG 201 - 250: 5 units CBG 251 - 300: 8 units CBG 301 - 350: 11 units CBG 351 - 400: 15 units CBG > 400: call MD and obtain STAT lab verification 10 mL 11 10/25/2017 at 0730  . insulin glargine (LANTUS) 100 UNIT/ML injection Inject 0.37 mLs (37 Units total) into the skin daily. (Patient taking differently: Inject 30 Units into the skin at bedtime. ) 10 mL 11 10/24/2017 at 2000  . loratadine (CLARITIN) 10 MG tablet Take 1 tablet (10 mg total) by mouth daily.   10/25/2017 at 0900  . metoprolol tartrate (LOPRESSOR) 25 MG tablet Take 1 tablet (25 mg total) by mouth 2 (two) times daily.   10/25/2017 at 0900  . Multiple Vitamin (MULTIVITAMIN WITH MINERALS) TABS tablet Take 1 tablet by mouth daily.   10/25/2017 at 0900  . oxybutynin (DITROPAN) 5 MG tablet Take 1 tablet (5 mg total) by mouth every 8 (eight) hours as needed for bladder spasms. 30 tablet 0 Unknown at PRN  . protein supplement shake (PREMIER PROTEIN) LIQD Take 325 mLs (11 oz total) by mouth 2 (two) times daily between meals.  0 Taking  . Rivaroxaban (XARELTO) 15 MG TABS tablet Take 15 mg by mouth daily.   Past Week at 0900  . rosuvastatin (CRESTOR) 5 MG tablet Take 1 tablet (5 mg total) by mouth daily at 6 PM.   10/24/2017 at 1800  . tamsulosin (FLOMAX) 0.4 MG CAPS capsule Take 1 capsule (0.4 mg total) by mouth daily after supper. 30 capsule  10/24/2017 at 1900  . vitamin C (VITAMIN C) 250 MG tablet Take 1 tablet (250 mg total) by mouth 2  (two) times daily.   Past Week at 0900   Allergies:  Allergies  Allergen Reactions  . Amiodarone Other (See Comments)    Thyrotoxicosis per Murray Hodgkins, NP    Family History  Problem Relation Age of Onset  . Heart attack Mother    Social History:  reports that he quit smoking about 44 years ago. He has never used smokeless tobacco. He reports that he does not drink alcohol or use drugs.  ROS: A complete review of systems was performed.  All systems are negative except for pertinent findings as noted. Review of Systems  All other systems reviewed and are negative.    Physical Exam:  Vital signs in last 24 hours:  Temp:  [98.6 F (37 C)-99.5 F (37.5 C)] 98.6 F (37 C) (09/21 1233) Pulse Rate:  [80-99] 86 (09/21 1233) Resp:  [14-18] 14 (09/21 1233) BP: (135-189)/(60-94) 151/73 (09/21 1233) SpO2:  [99 %-100 %] 100 % (09/21 1233) Weight:  [71.3 kg-72.2 kg] 71.3 kg (09/21 0101) General:  Alert and oriented, No acute distress HEENT: Normocephalic, atraumatic Cardiovascular: Regular rate and rhythm Lungs: Regular rate and effort Abdomen: Soft, nontender, nondistended, no abdominal masses Back: No CVA tenderness Extremities: No edema Neurologic: Grossly intact GU: Foley catheter in place-urine clear yellow.  Penis circumcised and without mass or lesion.  Laboratory Data:  Results for orders placed or performed during the hospital encounter of 10/25/17 (from the past 24 hour(s))  CBC     Status: Abnormal   Collection Time: 10/25/17  3:26 PM  Result Value Ref Range   WBC 20.1 (H) 3.8 - 10.6 K/uL   RBC 3.18 (L) 4.40 - 5.90 MIL/uL   Hemoglobin 9.5 (L) 13.0 - 18.0 g/dL   HCT 29.0 (L) 40.0 - 52.0 %   MCV 91.3 80.0 - 100.0 fL   MCH 30.0 26.0 - 34.0 pg   MCHC 32.8 32.0 - 36.0 g/dL   RDW 15.3 (H) 11.5 - 14.5 %   Platelets 662 (H) 150 - 440 K/uL  Glucose, capillary     Status: Abnormal   Collection Time: 10/25/17  3:39 PM  Result Value Ref Range   Glucose-Capillary 532  (HH) 70 - 99 mg/dL   Comment 1 Notify RN   Basic metabolic panel     Status: Abnormal   Collection Time: 10/25/17  4:07 PM  Result Value Ref Range   Sodium 127 (L) 135 - 145 mmol/L   Potassium 5.8 (H) 3.5 - 5.1 mmol/L   Chloride 98 98 - 111 mmol/L   CO2 19 (L) 22 - 32 mmol/L   Glucose, Bld 574 (HH) 70 - 99 mg/dL   BUN 37 (H) 8 - 23 mg/dL   Creatinine, Ser 2.16 (H) 0.61 - 1.24 mg/dL   Calcium 8.5 (L) 8.9 - 10.3 mg/dL   GFR calc non Af Amer 30 (L) >60 mL/min   GFR calc Af Amer 35 (L) >60 mL/min   Anion gap 10 5 - 15  Troponin I     Status: None   Collection Time: 10/25/17  4:07 PM  Result Value Ref Range   Troponin I <0.03 <0.03 ng/mL  Glucose, capillary     Status: Abnormal   Collection Time: 10/25/17  6:37 PM  Result Value Ref Range   Glucose-Capillary 574 (HH) 70 - 99 mg/dL   Comment 1 Notify RN   Lactic acid, plasma     Status: None   Collection Time: 10/25/17  6:58 PM  Result Value Ref Range   Lactic Acid, Venous 1.8 0.5 - 1.9 mmol/L  Digoxin level     Status: Abnormal   Collection Time: 10/25/17  7:15 PM  Result Value Ref Range   Digoxin Level 0.6 (L) 0.8 - 2.0 ng/mL  Urinalysis, Complete w Microscopic     Status: Abnormal   Collection Time: 10/25/17  7:15 PM  Result Value Ref Range   Color, Urine YELLOW (A) YELLOW   APPearance TURBID (A) CLEAR   Specific Gravity, Urine 1.014 1.005 - 1.030   pH 5.0 5.0 - 8.0   Glucose, UA >=500 (A) NEGATIVE mg/dL   Hgb urine dipstick LARGE (A) NEGATIVE   Bilirubin Urine NEGATIVE NEGATIVE   Ketones, ur 5 (  A) NEGATIVE mg/dL   Protein, ur 100 (A) NEGATIVE mg/dL   Nitrite NEGATIVE NEGATIVE   Leukocytes, UA LARGE (A) NEGATIVE   RBC / HPF >50 (H) 0 - 5 RBC/hpf   WBC, UA >50 (H) 0 - 5 WBC/hpf   Bacteria, UA RARE (A) NONE SEEN   Squamous Epithelial / LPF NONE SEEN 0 - 5   WBC Clumps PRESENT    Mucus PRESENT    WBC Casts, UA PRESENT   Culture, blood (routine x 2)     Status: None (Preliminary result)   Collection Time: 10/25/17  8:12  PM  Result Value Ref Range   Specimen Description BLOOD RIGHT ANTECUBITAL    Special Requests      BOTTLES DRAWN AEROBIC AND ANAEROBIC Blood Culture results may not be optimal due to an excessive volume of blood received in culture bottles   Culture      NO GROWTH < 12 HOURS Performed at [facility], [address] [place], [place] [address]    Report Status PENDING   Culture, blood (routine x 2)     Status: None (Preliminary result)   Collection Time: 10/25/17  8:12 PM  Result Value Ref Range   Specimen Description BLOOD BLOOD LEFT WRIST    Special Requests      BOTTLES DRAWN AEROBIC AND ANAEROBIC Blood Culture results may not be optimal due to an inadequate volume of blood received in culture bottles   Culture      NO GROWTH < 12 HOURS Performed at [facility], [address] [place], [place] [address]    Report Status PENDING   Glucose, capillary     Status: Abnormal   Collection Time: 10/25/17  8:54 PM  Result Value Ref Range   Glucose-Capillary 418 (H) 70 - 99 mg/dL  MRSA PCR Screening     Status: None   Collection Time: 10/26/17 12:57 AM  Result Value Ref Range   MRSA by PCR NEGATIVE NEGATIVE  Glucose, capillary     Status: Abnormal   Collection Time: 10/26/17 12:57 AM  Result Value Ref Range   Glucose-Capillary 381 (H) 70 - 99 mg/dL  Lactic acid, plasma     Status: None   Collection Time: 10/26/17  1:01 AM  Result Value Ref Range   Lactic Acid, Venous 1.4 0.5 - 1.9 mmol/L  Basic metabolic panel     Status: Abnormal   Collection Time: 10/26/17  1:01 AM  Result Value Ref Range   Sodium 135 135 - 145 mmol/L   Potassium 4.5 3.5 - 5.1 mmol/L   Chloride 105 98 - 111 mmol/L   CO2 20 (L) 22 - 32 mmol/L   Glucose, Bld 405 (H) 70 - 99 mg/dL   BUN 35 (H) 8 - 23 mg/dL   Creatinine, Ser 2.25 (H) 0.61 - 1.24 mg/dL   Calcium 8.5 (L) 8.9 - 10.3 mg/dL   GFR calc non Af Amer 28 (L) >60 mL/min   GFR calc Af Amer 33 (L) >60 mL/min   Anion gap 10 5 - 15   CBC     Status: Abnormal   Collection Time: 10/26/17  1:01 AM  Result Value Ref Range   WBC 12.3 (H) 3.8 - 10.6 K/uL   RBC 2.76 (L) 4.40 - 5.90 MIL/uL   Hemoglobin 8.5 (L) 13.0 - 18.0 g/dL   HCT 24.6 (L) 40.0 - 52.0 %   MCV 88.9 80.0 - 100.0 fL   MCH 30.8 26.0 - 34.0  pg   MCHC 34.6 32.0 - 36.0 g/dL   RDW 14.3 11.5 - 14.5 %   Platelets 573 (H) 150 - 440 K/uL  Glucose, capillary     Status: Abnormal   Collection Time: 10/26/17  7:45 AM  Result Value Ref Range   Glucose-Capillary 228 (H) 70 - 99 mg/dL   Comment 1 Notify RN   Glucose, capillary     Status: Abnormal   Collection Time: 10/26/17 11:43 AM  Result Value Ref Range   Glucose-Capillary 184 (H) 70 - 99 mg/dL   Comment 1 Notify RN    Recent Results (from the past 240 hour(s))  Urine culture     Status: Abnormal   Collection Time: 10/17/17  9:09 AM  Result Value Ref Range Status   Specimen Description   Final    URINE, RANDOM Performed at [facility], [address], [place], [place] [address]    Special Requests   Final    NONE Performed at [facility], [address] [place], [place] [address]    Culture >=100,000 COLONIES/mL KLEBSIELLA OXYTOCA (A)  Final   Report Status 10/19/2017 FINAL  Final   Organism ID, Bacteria KLEBSIELLA OXYTOCA (A)  Final      Susceptibility   Klebsiella oxytoca - MIC*    AMPICILLIN >=32 RESISTANT Resistant     CEFAZOLIN <=4 SENSITIVE Sensitive     CEFTRIAXONE <=1 SENSITIVE Sensitive     CIPROFLOXACIN <=0.25 SENSITIVE Sensitive     GENTAMICIN <=1 SENSITIVE Sensitive     IMIPENEM <=0.25 SENSITIVE Sensitive     NITROFURANTOIN 32 SENSITIVE Sensitive     TRIMETH/SULFA <=20 SENSITIVE Sensitive     AMPICILLIN/SULBACTAM 8 SENSITIVE Sensitive     PIP/TAZO <=4 SENSITIVE Sensitive     Extended ESBL NEGATIVE Sensitive     * >=100,000 COLONIES/mL KLEBSIELLA OXYTOCA  Culture, blood (routine x 2)     Status: None (Preliminary result)   Collection Time: 10/25/17  8:12 PM   Result Value Ref Range Status   Specimen Description BLOOD RIGHT ANTECUBITAL  Final   Special Requests   Final    BOTTLES DRAWN AEROBIC AND ANAEROBIC Blood Culture results may not be optimal due to an excessive volume of blood received in culture bottles   Culture   Final    NO GROWTH < 12 HOURS Performed at [facility], [address] [place], [place] [address]    Report Status PENDING  Incomplete  Culture, blood (routine x 2)     Status: None (Preliminary result)   Collection Time: 10/25/17  8:12 PM  Result Value Ref Range Status   Specimen Description BLOOD BLOOD LEFT WRIST  Final   Special Requests   Final    BOTTLES DRAWN AEROBIC AND ANAEROBIC Blood Culture results may not be optimal due to an inadequate volume of blood received in culture bottles   Culture   Final    NO GROWTH < 12 HOURS Performed at [facility], [address] [place], [place] [address]    Report Status PENDING  Incomplete  MRSA PCR Screening     Status: None   Collection Time: 10/26/17 12:57 AM  Result Value Ref Range Status   MRSA by PCR NEGATIVE NEGATIVE Final    Comment:        The GeneXpert MRSA Assay (FDA approved for NASAL specimens only), is one component of a comprehensive MRSA colonization surveillance program. It is not intended to diagnose MRSA infection nor to  guide or monitor treatment for MRSA infections. Performed at [facility], [address] [place],  [address]    Creatinine: Recent Labs    10/25/17 1353 10/25/17 1607 10/26/17 0101  CREATININE 2.22* 2.16* 2.25*    Impression/Assessment/plan:  #1 leukocytosis, elevated blood sugars-Repeat cultures are pending.  He is on Zosyn.  I do not believe the nephrostomy tubes need to be placed back to gravity drainage.  He has had no fevers, his white count is normalized.  Urine output is excellent.  #2 high-grade muscle invasive urothelial carcinoma- patient should keep postop appointment  to go over pathology.  He is certainly high risk cystectomy candidate and CT showed likely T4 disease but kidney fxn might limit oncologic options.  I did not go over pathology with him.   #3 bilateral hydronephrosis- status post bilateral ureteral stent.  As above, urine output is good and cr about at his new baseline.  Continue to monitor creatinine.  I left the nephrostomy tubes capped.  I will sign off.  Patient should go home on p.o. antibiotics pending current culture results.  He does have follow-up with Dr. Erlene Quan Thursday, October 31, 2017 and he should keep that appointment.  Please notify GU of any changes in patient's status, or any other questions or concerns.  Festus Aloe 10/26/2017, 3:08 PM

## 2017-10-26 NOTE — Clinical Social Work Note (Signed)
CSW received consult that patient has admitted from [facility]. CSW will assess when able.  Santiago Bumpers, MSW, Latanya Presser [phone]

## 2017-10-27 LAB — CBC
HCT: 24.9 % — ABNORMAL LOW (ref 40.0–52.0)
HEMOGLOBIN: 8.6 g/dL — AB (ref 13.0–18.0)
MCH: 30.5 pg (ref 26.0–34.0)
MCHC: 34.4 g/dL (ref 32.0–36.0)
MCV: 88.4 fL (ref 80.0–100.0)
Platelets: 587 10*3/uL — ABNORMAL HIGH (ref 150–440)
RBC: 2.82 MIL/uL — AB (ref 4.40–5.90)
RDW: 14.4 % (ref 11.5–14.5)
WBC: 11.4 10*3/uL — AB (ref 3.8–10.6)

## 2017-10-27 LAB — BASIC METABOLIC PANEL
Anion gap: 9 (ref 5–15)
BUN: 34 mg/dL — ABNORMAL HIGH (ref 8–23)
CALCIUM: 8.6 mg/dL — AB (ref 8.9–10.3)
CO2: 22 mmol/L (ref 22–32)
CREATININE: 2.31 mg/dL — AB (ref 0.61–1.24)
Chloride: 107 mmol/L (ref 98–111)
GFR calc Af Amer: 32 mL/min — ABNORMAL LOW (ref >60)
GFR calc non Af Amer: 28 mL/min — ABNORMAL LOW (ref >60)
GLUCOSE: 251 mg/dL — AB (ref 70–99)
Potassium: 4.1 mmol/L (ref 3.5–5.1)
Sodium: 138 mmol/L (ref 135–145)

## 2017-10-27 LAB — URINE CULTURE: CULTURE: NO GROWTH

## 2017-10-27 LAB — GLUCOSE, CAPILLARY
GLUCOSE-CAPILLARY: 275 mg/dL — AB (ref 70–99)
GLUCOSE-CAPILLARY: 336 mg/dL — AB (ref 70–99)
Glucose-Capillary: 248 mg/dL — ABNORMAL HIGH (ref 70–99)
Glucose-Capillary: 354 mg/dL — ABNORMAL HIGH (ref 70–99)

## 2017-10-27 MED ORDER — RIVAROXABAN 15 MG PO TABS
15.0000 mg | ORAL_TABLET | Freq: Every day | ORAL | Status: DC
  Administered 2017-10-27 – 2017-10-28 (×2): 15 mg via ORAL
  Filled 2017-10-27 (×2): qty 1

## 2017-10-27 NOTE — Progress Notes (Signed)
[facility] NAME: Jordan [name]    MR#:  [id number]  DATE OF BIRTH:  1951/01/24  SUBJECTIVE:  CHIEF COMPLAINT:   Chief Complaint  Patient presents with  . Hyperglycemia  Patient seen and evaluated today No fever No complaints of any shortness of breath no chest pain Tolerating diet okay  REVIEW OF SYSTEMS:    ROS  CONSTITUTIONAL: No documented fever. No fatigue, weakness. No weight gain, no weight loss.  EYES: No blurry or double vision.  ENT: No tinnitus. No postnasal drip. No redness of the oropharynx.  RESPIRATORY: No cough, no wheeze, no hemoptysis. No dyspnea.  CARDIOVASCULAR: No chest pain. No orthopnea. No palpitations. No syncope.  GASTROINTESTINAL: No nausea, no vomiting or diarrhea. No abdominal pain. No melena or hematochezia.  GENITOURINARY: No dysuria or hematuria.  Has bilateral nephrostomy tubes ENDOCRINE: No polyuria or nocturia. No heat or cold intolerance.  HEMATOLOGY: No anemia. No bruising. No bleeding.  INTEGUMENTARY: No rashes. No lesions.  MUSCULOSKELETAL: No arthritis. No swelling. No gout.  NEUROLOGIC: No numbness, tingling, or ataxia. No seizure-type activity.  PSYCHIATRIC: No anxiety. No insomnia. No ADD.   DRUG ALLERGIES:   Allergies  Allergen Reactions  . Amiodarone Other (See Comments)    Thyrotoxicosis per Murray Hodgkins, NP    VITALS:  Blood pressure 120/75, pulse 72, temperature 98.1 F (36.7 C), temperature source Oral, resp. rate 18, height 5\' 10"  (1.778 m), weight 71.3 kg, SpO2 99 %.  PHYSICAL EXAMINATION:   Physical Exam  GENERAL:  67 y.o.-year-old patient lying in the bed with no acute distress.  EYES: Pupils equal, round, reactive to light and accommodation. No scleral icterus. Extraocular muscles intact.  HEENT: Head atraumatic, normocephalic. Oropharynx and nasopharynx clear.  NECK:  Supple, no jugular venous distention. No thyroid enlargement, no tenderness.  LUNGS:  Normal breath sounds bilaterally, no wheezing, rales, rhonchi. No use of accessory muscles of respiration.  CARDIOVASCULAR: S1, S2 normal. No murmurs, rubs, or gallops.  ABDOMEN: Soft, nontender, nondistended. Bowel sounds present. No organomegaly or mass.  EXTREMITIES: No cyanosis, clubbing or edema b/l.    NEUROLOGIC: Cranial nerves II through XII are intact. No focal Motor or sensory deficits b/l.   PSYCHIATRIC: The patient is alert and oriented x 3.  SKIN: No obvious rash, lesion, or ulcer.   LABORATORY PANEL:   CBC Recent Labs  Lab 10/27/17 0520  WBC 11.4*  HGB 8.6*  HCT 24.9*  PLT 587*   ------------------------------------------------------------------------------------------------------------------ Chemistries  Recent Labs  Lab 10/27/17 0520  NA 138  K 4.1  CL 107  CO2 22  GLUCOSE 251*  BUN 34*  CREATININE 2.31*  CALCIUM 8.6*   ------------------------------------------------------------------------------------------------------------------  Cardiac Enzymes Recent Labs  Lab 10/25/17 1607  TROPONINI <0.03   ------------------------------------------------------------------------------------------------------------------  RADIOLOGY:  Dg Chest Port 1 View  Result Date: 10/25/2017 CLINICAL DATA:  Hyperglycemia. Hyperkalemia. Recent acute renal failure. EXAM: PORTABLE CHEST 1 VIEW COMPARISON:  09/26/2017 and 09/23/2017 radiographs. FINDINGS: 1834 hour. The heart size and mediastinal contours are normal. The lungs are clear. There is no pleural effusion or pneumothorax. No acute osseous findings are identified. There are probable bilateral ureteral stents. IMPRESSION: No active cardiopulmonary process. Electronically Signed   By: Richardean Sale M.D.   On: 10/25/2017 18:52     ASSESSMENT AND PLAN:   67 year old male patient with history of diabetes mellitus type 2, hypertension, paroxysmal atrial fibrillation, colon cancer, hemicolectomy, bladder cancer,  bilateral nephrostomy tubes placement for ureteral obstruction  currently under service for urinary tract infection  -Complicated urinary tract infection Leukocytosis improved Continue IV Zosyn antibiotic Follow-up urine cultures and sensitivities  -High-grade invasive urothelial carcinoma Recent TURBT with bilateral ureteral stent placements and bilateral nephrostomy tubes Monitor renal function Urology follow-up appreciated No acute intervention recommended Continue oxybutynin and Flomax Oncology follow-up as outpatient Urology follow-up as outpatient  -Hyperglycemia Not in diabetic ketoacidosis Sliding scale coverage with insulin with Lantus coverage Increase dose of lantus insulin to 20 untis [place] BID  -Hyperkalemia improved  -Paroxysmal atrial fibrillation Currently in sinus rhythm Continue oral amiodarone and metoprolol Xarelto will be resumed  -Disposition Awaiting insurance authorization [facility]  All the records are reviewed and case discussed with Care Management/Social Worker. Management plans discussed with the patient, family and they are in agreement.  CODE STATUS: Full code  DVT Prophylaxis: SCDs  TOTAL TIME TAKING CARE OF THIS PATIENT: 35 minutes.   POSSIBLE D/C IN 2 to 3 DAYS, DEPENDING ON CLINICAL CONDITION.  Saundra Shelling M.D on 10/27/2017 at 1:18 PM  Between 7am to 6pm - Pager - [phone]  After 6pm go to [email or web address] - password [email or web address] [facility]  Office  [phone]  CC: Primary care physician; Albina Billet, MD  Note: This dictation was prepared with Dragon dictation along with smaller phrase technology. Any transcriptional errors that result from this process are unintentional.

## 2017-10-28 ENCOUNTER — Telehealth: Payer: Self-pay | Admitting: Urology

## 2017-10-28 LAB — CBC
HCT: 25 % — ABNORMAL LOW (ref 40.0–52.0)
Hemoglobin: 8.5 g/dL — ABNORMAL LOW (ref 13.0–18.0)
MCH: 30.1 pg (ref 26.0–34.0)
MCHC: 34.1 g/dL (ref 32.0–36.0)
MCV: 88.4 fL (ref 80.0–100.0)
Platelets: 595 10*3/uL — ABNORMAL HIGH (ref 150–440)
RBC: 2.82 MIL/uL — AB (ref 4.40–5.90)
RDW: 14.2 % (ref 11.5–14.5)
WBC: 12 10*3/uL — ABNORMAL HIGH (ref 3.8–10.6)

## 2017-10-28 LAB — GLUCOSE, CAPILLARY
GLUCOSE-CAPILLARY: 246 mg/dL — AB (ref 70–99)
Glucose-Capillary: 226 mg/dL — ABNORMAL HIGH (ref 70–99)
Glucose-Capillary: 232 mg/dL — ABNORMAL HIGH (ref 70–99)

## 2017-10-28 MED ORDER — HYDROCODONE-ACETAMINOPHEN 5-325 MG PO TABS: 1.0000 | ORAL_TABLET | Freq: Four times a day (QID) | ORAL | 0 refills | Status: DC | PRN

## 2017-10-28 MED ORDER — CEPHALEXIN 500 MG PO CAPS: 500.0000 mg | ORAL_CAPSULE | Freq: Three times a day (TID) | ORAL | 0 refills | Status: AC

## 2017-10-28 NOTE — Telephone Encounter (Signed)
When did you want this patient to get his chest CT?  Sharyn Lull

## 2017-10-28 NOTE — Clinical Social Work Note (Signed)
[facility] received reauth from [facility] and patient is returning today. Sister requests EMS transport. Discharge information sent. Shela Leff MSW,LCSW [phone]

## 2017-10-28 NOTE — Discharge Summary (Signed)
[facility] NAME: Jordan [name]    MR#:  [id number]  DATE OF BIRTH:  11/14/1950  DATE OF ADMISSION:  10/25/2017 ADMITTING PHYSICIAN: Sela Hua, MD  DATE OF DISCHARGE: 10/28/2017  PRIMARY CARE PHYSICIAN: Albina Billet, MD   ADMISSION DIAGNOSIS:  Weakness [R53.1] Hyperglycemia [R73.9] Atrial fibrillation Hypertension Complicated urinary tract infection High-grade invasive urothelial carcinoma Hyperkalemia Chronic kidney disease stage III Chronic systolic heart failure DISCHARGE DIAGNOSIS:  Active Problems:   Urinary tract infection High-grade invasive urothelial carcinoma Hyperkalemia Chronic kidney disease stage III Chronic systolic heart failure Atrial fibrillation Hypertension  SECONDARY DIAGNOSIS:   Past Medical History:  Diagnosis Date  . Atrial fibrillation ([place])   . Cancer ([place])    a. 06/2011 b. s/p right hemicolectomy/ colon CA  . Colon polyps   . Dysrhythmia   . Hypertension   . Hyperthyroidism    a. 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 Stroke ([facility]) 03/2012   a. residual right sided weakness  . Type 1 diabetes ([facility])    a. on insulin b. prior DKA      ADMITTING HISTORY Jordan [name]  is a 67 y.o. male with a known history of HTN, type 1 diabetes, paroxysmal atrial fibrillation, hyperthyroidism, history of colon cancer s/p right hemicolectomy, recent diagnosis of bladder cancer who was sent from the urology office to the ED for markedly elevated blood sugars and hyperkalemia. He was recently admitted from 8/17-8/27 with bilateral hydronephrosis and acute renal failure requiring dialysis due to bilateral ureteral obstruction. Bilateral nephrostomy tubes were placed while he was inpatient. He followed up in the urology office and had a cystoscopy that was concerning for bladder. He underwent TURBT and bilateral ureteral stent placement on 10/23/17. Surgical pathology showed high grade  carcinoma. He states he has been having bladder and penile pain for the last couple of days. No fevers or chills. No back or flank pain.In the ED, blood sugars were 574 but anion gap was normal. K was 5.8. UA showed large leukocytes and large hemoglobin. He was given zosyn, fluids, and 10 units of novolog. Hospitalists were called for admission.  HOSPITAL COURSE:  Patient was admitted to medical floor.  Patient received IV Zosyn antibiotic during the hospitalization.  Urine and blood cultures did not reveal any growth.  His hyperglycemia resolved. Patient received a dose of oral Veltassa for hyperkalemia and it improved.  Urology consultation was done for history of for TURBT, bilateral ureteral stents and bilateral nephrostomy tubes.  No acute intervention recommended.  Patient tolerated diet well.  Patient says Xarelto was resumed for anticoagulation.  [facility]  He will be discharged back to the facility. CONSULTS OBTAINED:  Treatment Team:  Festus Aloe, MD  DRUG ALLERGIES:   Allergies  Allergen Reactions  . Amiodarone Other (See Comments)    Thyrotoxicosis per Murray Hodgkins, NP    DISCHARGE MEDICATIONS:   Allergies as of 10/28/2017      Reactions   Amiodarone Other (See Comments)   Thyrotoxicosis per Murray Hodgkins, NP      Medication List    TAKE these medications   acetaminophen 325 MG tablet Commonly known as:  TYLENOL Take 2 tablets (650 mg total) by mouth every 6 (six) hours as needed for mild pain (or Fever >/= 101).   amiodarone 200 MG tablet Commonly known as:  PACERONE Take 200 mg by mouth daily.  ascorbic acid 250 MG tablet Commonly known as:  VITAMIN C Take 1 tablet (250 mg total) by mouth 2 (two) times daily.   cephALEXin 500 MG capsule Commonly known as:  KEFLEX Take 1 capsule (500 mg total) by mouth 3 (three) times daily for 7 days.   Digoxin 62.5 MCG Tabs Take 0.0625 mg by mouth daily.   docusate sodium 100 MG  capsule Commonly known as:  COLACE Take 1 capsule (100 mg total) by mouth 2 (two) times daily.   famotidine 20 MG tablet Commonly known as:  PEPCID Take 1 tablet (20 mg total) by mouth daily.   folic acid 1 MG tablet Commonly known as:  FOLVITE Take 1 mg by mouth daily.   HYDROcodone-acetaminophen 5-325 MG tablet Commonly known as:  NORCO/VICODIN Take 1-2 tablets by mouth every 6 (six) hours as needed for moderate pain.   insulin aspart 100 UNIT/ML injection Commonly known as:  novoLOG Inject 10 Units into the skin 3 (three) times daily with meals. What changed:  how much to take   insulin aspart 100 UNIT/ML injection Commonly known as:  novoLOG Inject 0-15 Units into the skin 4 (four) times daily -  before meals and at bedtime. CBG < 70: implement hypoglycemia protocol CBG 70 - 120: 0 units CBG 121 - 150: 2 units CBG 151 - 200: 3 units CBG 201 - 250: 5 units CBG 251 - 300: 8 units CBG 301 - 350: 11 units CBG 351 - 400: 15 units CBG > 400: call MD and obtain STAT lab verification What changed:  Another medication with the same name was changed. Make sure you understand how and when to take each.   insulin glargine 100 UNIT/ML injection Commonly known as:  LANTUS Inject 0.37 mLs (37 Units total) into the skin daily. What changed:    how much to take  when to take this   loratadine 10 MG tablet Commonly known as:  CLARITIN Take 1 tablet (10 mg total) by mouth daily.   metoprolol tartrate 25 MG tablet Commonly known as:  LOPRESSOR Take 1 tablet (25 mg total) by mouth 2 (two) times daily.   multivitamin with minerals Tabs tablet Take 1 tablet by mouth daily.   oxybutynin 5 MG tablet Commonly known as:  DITROPAN Take 1 tablet (5 mg total) by mouth every 8 (eight) hours as needed for bladder spasms.   protein supplement shake Liqd Commonly known as:  PREMIER PROTEIN Take 325 mLs (11 oz total) by mouth 2 (two) times daily between meals.   Rivaroxaban 15 MG Tabs  tablet Commonly known as:  XARELTO Take 15 mg by mouth daily.   rosuvastatin 5 MG tablet Commonly known as:  CRESTOR Take 1 tablet (5 mg total) by mouth daily at 6 PM.   tamsulosin 0.4 MG Caps capsule Commonly known as:  FLOMAX Take 1 capsule (0.4 mg total) by mouth daily after supper.       Today  Patient seen and evaluated today Tolerating diet well No abdominal flank pain No fever and chills  VITAL SIGNS:  Blood pressure (!) 154/75, pulse 86, temperature 98.2 F (36.8 C), temperature source Oral, resp. rate 18, height 5\' 10"  (1.778 m), weight 71.3 kg, SpO2 98 %.  I/O:    Intake/Output Summary (Last 24 hours) at 10/28/2017 1052 Last data filed at 10/28/2017 0900 Gross per 24 hour  Intake 684.74 ml  Output 2150 ml  Net -1465.26 ml    PHYSICAL EXAMINATION:  Physical Exam  GENERAL:  67 y.o.-year-old patient lying in the bed with no acute distress.  LUNGS: Normal breath sounds bilaterally, no wheezing, rales,rhonchi or crepitation. No use of accessory muscles of respiration.  CARDIOVASCULAR: S1, S2 normal. No murmurs, rubs, or gallops.  ABDOMEN: Soft, non-tender, non-distended. Bowel sounds present. No organomegaly or mass.  NEUROLOGIC: Moves all 4 extremities. PSYCHIATRIC: The patient is alert and oriented x 3.  SKIN: No obvious rash, lesion, or ulcer.   DATA REVIEW:   CBC Recent Labs  Lab 10/28/17 0528  WBC 12.0*  HGB 8.5*  HCT 25.0*  PLT 595*    Chemistries  Recent Labs  Lab 10/27/17 0520  NA 138  K 4.1  CL 107  CO2 22  GLUCOSE 251*  BUN 34*  CREATININE 2.31*  CALCIUM 8.6*    Cardiac Enzymes Recent Labs  Lab 10/25/17 1607  TROPONINI <0.03    Microbiology Results  Results for orders placed or performed during the hospital encounter of 10/25/17  Urine culture     Status: None   Collection Time: 10/25/17  7:15 PM  Result Value Ref Range Status   Specimen Description   Final    URINE, RANDOM Performed at [facility], [address] [place], [place] [address]    Special Requests   Final    NONE Performed at [facility], [address] [place], [place] [address]    Culture   Final    NO GROWTH Performed at [facility], [place] [address], [place], [place] [address]    Report Status 10/27/2017 FINAL  Final  Culture, blood (routine x 2)     Status: None (Preliminary result)   Collection Time: 10/25/17  8:12 PM  Result Value Ref Range Status   Specimen Description BLOOD RIGHT ANTECUBITAL  Final   Special Requests   Final    BOTTLES DRAWN AEROBIC AND ANAEROBIC Blood Culture results may not be optimal due to an excessive volume of blood received in culture bottles   Culture   Final    NO GROWTH 3 DAYS Performed at [facility], [address], [place], [place] [address]    Report Status PENDING  Incomplete  Culture, blood (routine x 2)     Status: None (Preliminary result)   Collection Time: 10/25/17  8:12 PM  Result Value Ref Range Status   Specimen Description BLOOD BLOOD LEFT WRIST  Final   Special Requests   Final    BOTTLES DRAWN AEROBIC AND ANAEROBIC Blood Culture results may not be optimal due to an inadequate volume of blood received in culture bottles   Culture   Final    NO GROWTH 3 DAYS Performed at [facility], [address], [place], [place] [address]    Report Status PENDING  Incomplete  MRSA PCR Screening     Status: None   Collection Time: 10/26/17 12:57 AM  Result Value Ref Range Status   MRSA by PCR NEGATIVE NEGATIVE Final    Comment:        The GeneXpert MRSA Assay (FDA approved for NASAL specimens only), is one component of a comprehensive MRSA colonization surveillance program. It is not intended to diagnose MRSA infection nor to guide or monitor treatment for MRSA infections. Performed at [facility], [address], [place], [place] [address]     RADIOLOGY:  No results found.  Follow up with PCP in 1  week.  Management plans discussed with the patient, family and they  are in agreement.  CODE STATUS: Full code    Code Status Orders  (From admission, onward)         Start     Ordered   10/26/17 0044  Full code  Continuous     10/26/17 0043        Code Status History    Date Active Date Inactive Code Status Order ID Comments User Context   09/21/2017 1548 10/01/2017 1912 Full Code [id number]  Loletha Grayer, MD ED   03/21/2014 0955 03/30/2014 1829 Full Code [id number]  Bensimhon, Shaune Pascal, MD Inpatient   03/20/2014 2024 03/21/2014 0955 Full Code [id number]  Luz Brazen, MD Inpatient   03/20/2014 2023 03/20/2014 2024 DNR [id number]  Luz Brazen, MD Inpatient   03/19/2014 2205 03/20/2014 2023 Full Code [id number]  Raliegh Ip, MD Inpatient      TOTAL TIME TAKING CARE OF THIS PATIENT ON DAY OF DISCHARGE: more than 34 minutes.   Saundra Shelling M.D on 10/28/2017 at 10:52 AM  Between 7am to 6pm - Pager - [phone]  After 6pm go to [email or web address] - password [email or web address] [facility]  Office  [phone]  CC: Primary care physician; Albina Billet, MD  Note: This dictation was prepared with Dragon dictation along with smaller phrase technology. Any transcriptional errors that result from this process are unintentional.

## 2017-10-28 NOTE — Telephone Encounter (Signed)
ASAP, ideally before Thursday so we can discuss at tumor board.  Hollice Espy, MD

## 2017-10-28 NOTE — Progress Notes (Signed)
RN called to request non emergency transport for pt. Report called to Kim at [employer].

## 2017-10-28 NOTE — Clinical Social Work Note (Signed)
Patient to discharge today to return to [facility] however he has to have reauth from [employer] before discharge. Discharge information provided to [facility]. Shela Leff MSW,LCSW [phone]

## 2017-10-28 NOTE — Progress Notes (Signed)
Pt discharged per MD order.IV removed. Pt discharged via EMS to [facility]

## 2017-10-28 NOTE — Care Management Important Message (Signed)
Copy of signed IM left with patient in room.  

## 2017-10-28 NOTE — Telephone Encounter (Signed)
I have sent a message to scheduling to see if this can happen. I will let you know if there is a problem.  MB

## 2017-10-28 NOTE — Progress Notes (Signed)
Inpatient Diabetes Program Recommendations  AACE/ADA: New Consensus Statement on Inpatient Glycemic Control (2019)  Target Ranges:  Prepandial:   less than 140 mg/dL      [facility] postprandial:   less than 180 mg/dL (1-2 hours)      Critically ill patients:  140 - 180 mg/dL   Results for [name], [name] (MRN [id number]) as of 10/28/2017 09:36  Ref. Range 10/27/2017 07:32 10/27/2017 11:48 10/27/2017 16:24 10/27/2017 22:48 10/28/2017 07:38  Glucose-Capillary Latest Ref Range: 70 - 99 mg/dL 275 (H) 336 (H) 248 (H) 354 (H) 246 (H)   Review of Glycemic Control  Diabetes history: DM1 Outpatient Diabetes medications: Lantus 30 units daily, Novolog 8 units TID with meals plus correction scale Current orders for Inpatient glycemic control: Lantus 20 units BID, Novolog 0-15 units TID with meals, Novolog 0-5 units QHS  Inpatient Diabetes Program Recommendations:  Insulin - Meal Coverage: Please consider ordering Novolog 5 units TID with meals for meal coverage if patient eats at least 50% of meals.  NOTE: In reviewing chart, noted patient has Type 1 DM is followed by Dr. Gabriel Carina for DM management. Patient last seen Dr. Gabriel Carina on 06/05/17 and per office note patient is prescribed Lantus 28 units QAM, Humalog 10 units with breakfast, 8 units with lunch, 12 units with supper (4 units for snacks), and Humalog 1 unit for every 50 mg/dl above target glucose of 150 mg/dl. Home medication list has Novolog instead of Humalog listed as bolus insulin.  Thanks, Barnie Alderman, RN, MSN, CDE Diabetes Coordinator Inpatient Diabetes Program [phone] (Team Pager from 8am to 5pm)

## 2017-10-30 ENCOUNTER — Ambulatory Visit
Admission: RE | Admit: 2017-10-30 | Discharge: 2017-10-30 | Disposition: A | Discharge status: Home / Self Care | Payer: Medicare Other | Source: Ambulatory Visit | Attending: Urology | Admitting: Urology

## 2017-10-30 DIAGNOSIS — I2510 Atherosclerotic heart disease of native coronary artery without angina pectoris: Secondary | ICD-10-CM | POA: Insufficient documentation

## 2017-10-30 DIAGNOSIS — N1330 Unspecified hydronephrosis: Secondary | ICD-10-CM | POA: Diagnosis not present

## 2017-10-30 DIAGNOSIS — C678 Malignant neoplasm of overlapping sites of bladder: Secondary | ICD-10-CM | POA: Diagnosis not present

## 2017-10-30 LAB — CULTURE, BLOOD (ROUTINE X 2)
Culture: NO GROWTH
Culture: NO GROWTH

## 2017-10-31 ENCOUNTER — Ambulatory Visit (INDEPENDENT_AMBULATORY_CARE_PROVIDER_SITE_OTHER): Payer: Medicare Other | Admitting: Urology

## 2017-10-31 ENCOUNTER — Other Ambulatory Visit: Payer: Self-pay

## 2017-10-31 ENCOUNTER — Encounter: Payer: Self-pay | Admitting: Oncology

## 2017-10-31 ENCOUNTER — Ambulatory Visit: Payer: Medicare Other | Admitting: Urology

## 2017-10-31 ENCOUNTER — Inpatient Hospital Stay: Payer: Medicare Other | Attending: Oncology | Admitting: Oncology

## 2017-10-31 ENCOUNTER — Inpatient Hospital Stay: Payer: Medicare Other

## 2017-10-31 VITALS — BP 145/70 | HR 77

## 2017-10-31 VITALS — BP 127/64 | HR 64 | Temp 97.6°F | Resp 16

## 2017-10-31 DIAGNOSIS — C689 Malignant neoplasm of urinary organ, unspecified: Secondary | ICD-10-CM | POA: Diagnosis not present

## 2017-10-31 DIAGNOSIS — C679 Malignant neoplasm of bladder, unspecified: Secondary | ICD-10-CM | POA: Diagnosis present

## 2017-10-31 DIAGNOSIS — Z87891 Personal history of nicotine dependence: Secondary | ICD-10-CM | POA: Insufficient documentation

## 2017-10-31 DIAGNOSIS — Z992 Dependence on renal dialysis: Secondary | ICD-10-CM | POA: Diagnosis not present

## 2017-10-31 DIAGNOSIS — I4891 Unspecified atrial fibrillation: Secondary | ICD-10-CM | POA: Diagnosis not present

## 2017-10-31 DIAGNOSIS — D649 Anemia, unspecified: Secondary | ICD-10-CM | POA: Insufficient documentation

## 2017-10-31 DIAGNOSIS — Z800 Family history of malignant neoplasm of digestive organs: Secondary | ICD-10-CM | POA: Diagnosis not present

## 2017-10-31 DIAGNOSIS — Z801 Family history of malignant neoplasm of trachea, bronchus and lung: Secondary | ICD-10-CM | POA: Diagnosis not present

## 2017-10-31 DIAGNOSIS — D75839 Thrombocytosis, unspecified: Secondary | ICD-10-CM

## 2017-10-31 DIAGNOSIS — Z7901 Long term (current) use of anticoagulants: Secondary | ICD-10-CM | POA: Insufficient documentation

## 2017-10-31 DIAGNOSIS — N179 Acute kidney failure, unspecified: Secondary | ICD-10-CM | POA: Insufficient documentation

## 2017-10-31 DIAGNOSIS — Z85038 Personal history of other malignant neoplasm of large intestine: Secondary | ICD-10-CM | POA: Insufficient documentation

## 2017-10-31 DIAGNOSIS — Z9049 Acquired absence of other specified parts of digestive tract: Secondary | ICD-10-CM | POA: Insufficient documentation

## 2017-10-31 DIAGNOSIS — Z7189 Other specified counseling: Secondary | ICD-10-CM

## 2017-10-31 DIAGNOSIS — Z794 Long term (current) use of insulin: Secondary | ICD-10-CM | POA: Diagnosis not present

## 2017-10-31 DIAGNOSIS — N289 Disorder of kidney and ureter, unspecified: Secondary | ICD-10-CM | POA: Insufficient documentation

## 2017-10-31 DIAGNOSIS — C678 Malignant neoplasm of overlapping sites of bladder: Secondary | ICD-10-CM

## 2017-10-31 DIAGNOSIS — Z79899 Other long term (current) drug therapy: Secondary | ICD-10-CM | POA: Insufficient documentation

## 2017-10-31 DIAGNOSIS — E109 Type 1 diabetes mellitus without complications: Secondary | ICD-10-CM | POA: Insufficient documentation

## 2017-10-31 DIAGNOSIS — D473 Essential (hemorrhagic) thrombocythemia: Secondary | ICD-10-CM

## 2017-10-31 DIAGNOSIS — Z8673 Personal history of transient ischemic attack (TIA), and cerebral infarction without residual deficits: Secondary | ICD-10-CM | POA: Insufficient documentation

## 2017-10-31 LAB — CBC WITH DIFFERENTIAL/PLATELET
Basophils Absolute: 0.1 10*3/uL (ref 0–0.1)
Basophils Relative: 1 %
EOS ABS: 0.2 10*3/uL (ref 0–0.7)
EOS PCT: 2 %
HCT: 24.4 % — ABNORMAL LOW (ref 40.0–52.0)
Hemoglobin: 8.3 g/dL — ABNORMAL LOW (ref 13.0–18.0)
LYMPHS ABS: 1.2 10*3/uL (ref 1.0–3.6)
Lymphocytes Relative: 12 %
MCH: 30 pg (ref 26.0–34.0)
MCHC: 33.9 g/dL (ref 32.0–36.0)
MCV: 88.3 fL (ref 80.0–100.0)
MONO ABS: 0.9 10*3/uL (ref 0.2–1.0)
Monocytes Relative: 9 %
Neutro Abs: 7.9 10*3/uL — ABNORMAL HIGH (ref 1.4–6.5)
Neutrophils Relative %: 76 %
PLATELETS: 662 10*3/uL — AB (ref 150–440)
RBC: 2.76 MIL/uL — ABNORMAL LOW (ref 4.40–5.90)
RDW: 14.4 % (ref 11.5–14.5)
WBC: 10.2 10*3/uL (ref 3.8–10.6)

## 2017-10-31 LAB — COMPREHENSIVE METABOLIC PANEL
ALBUMIN: 2.7 g/dL — AB (ref 3.5–5.0)
ALT: 15 U/L (ref 0–44)
AST: 18 U/L (ref 15–41)
Alkaline Phosphatase: 103 U/L (ref 38–126)
Anion gap: 9 (ref 5–15)
BILIRUBIN TOTAL: 0.4 mg/dL (ref 0.3–1.2)
BUN: 41 mg/dL — AB (ref 8–23)
CHLORIDE: 102 mmol/L (ref 98–111)
CO2: 18 mmol/L — ABNORMAL LOW (ref 22–32)
CREATININE: 3.3 mg/dL — AB (ref 0.61–1.24)
Calcium: 8.6 mg/dL — ABNORMAL LOW (ref 8.9–10.3)
GFR calc Af Amer: 21 mL/min — ABNORMAL LOW (ref >60)
GFR calc non Af Amer: 18 mL/min — ABNORMAL LOW (ref >60)
GLUCOSE: 337 mg/dL — AB (ref 70–99)
Potassium: 4.5 mmol/L (ref 3.5–5.1)
Sodium: 129 mmol/L — ABNORMAL LOW (ref 135–145)
Total Protein: 7.7 g/dL (ref 6.5–8.1)

## 2017-10-31 NOTE — Progress Notes (Signed)
Patient here today as a new patient  

## 2017-10-31 NOTE — Progress Notes (Signed)
Patient presented to clinic today but did not have a scheduled appointment (previously rescheduled).   He was seen and evaluated by my MA today and was having pain from his Foley catheter secondary to tension on the catheter which was corrected and secured.  He was offered a voiding trial but ultimately declined due to concern for failure of voiding trial.  Due his discomfort, he elected not to stay to see me this AM (did not want to wait).   Most importantly, he has an appointment this afternoon with Dr. Tasia Catchings of medical oncology.  His case was discussed today tumor board as well.  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  Due to his creatinine, he is not a candidate for cisplatin.  He is also a fairly poor surgical candidate for cystoprostatectomy.  As such, will likely pursue tri-modal therapy with chemo, rads, and TUR-resection to continue to debulk him.  Additionally, he is a relatively high risk for stent failure.  As such, I like him to keep his percutaneous nephrostomy tubes in place and capped for the time being, reopen if his creatinine begins to rise.    He can follow up with me on 10/17 as scheduled.    Hollice Espy, MD

## 2017-11-01 ENCOUNTER — Encounter (INDEPENDENT_AMBULATORY_CARE_PROVIDER_SITE_OTHER): Payer: Self-pay

## 2017-11-01 ENCOUNTER — Telehealth: Payer: Self-pay

## 2017-11-01 ENCOUNTER — Telehealth: Payer: Self-pay | Admitting: *Deleted

## 2017-11-01 ENCOUNTER — Other Ambulatory Visit (INDEPENDENT_AMBULATORY_CARE_PROVIDER_SITE_OTHER): Payer: Self-pay | Admitting: Nurse Practitioner

## 2017-11-01 NOTE — Telephone Encounter (Addendum)
Appointment given to wife for consultation with Dr. Baruch Gouty with readback of date and time of appointment.   Wife requested that [facility] also be called with the appointment.  Appointment also given to [facility] per family request.

## 2017-11-01 NOTE — Telephone Encounter (Signed)
Called [facility] and spoke to [place], pt's nurse, to notify her about upcoming appt for port placement on 10/2 @ 215pm.

## 2017-11-03 ENCOUNTER — Encounter: Payer: Self-pay | Admitting: Oncology

## 2017-11-03 DIAGNOSIS — Z7901 Long term (current) use of anticoagulants: Secondary | ICD-10-CM | POA: Insufficient documentation

## 2017-11-03 DIAGNOSIS — C689 Malignant neoplasm of urinary organ, unspecified: Secondary | ICD-10-CM | POA: Insufficient documentation

## 2017-11-03 DIAGNOSIS — D649 Anemia, unspecified: Secondary | ICD-10-CM | POA: Insufficient documentation

## 2017-11-03 DIAGNOSIS — D75839 Thrombocytosis, unspecified: Secondary | ICD-10-CM | POA: Insufficient documentation

## 2017-11-03 DIAGNOSIS — D473 Essential (hemorrhagic) thrombocythemia: Secondary | ICD-10-CM | POA: Insufficient documentation

## 2017-11-03 DIAGNOSIS — Z7189 Other specified counseling: Secondary | ICD-10-CM | POA: Insufficient documentation

## 2017-11-03 MED ORDER — PROCHLORPERAZINE MALEATE 10 MG PO TABS: 10.0000 mg | ORAL_TABLET | Freq: Four times a day (QID) | ORAL | 1 refills | Status: DC | PRN

## 2017-11-03 MED ORDER — LIDOCAINE-PRILOCAINE 2.5-2.5 % EX CREA: TOPICAL_CREAM | CUTANEOUS | 3 refills | Status: DC

## 2017-11-03 MED ORDER — ONDANSETRON HCL 8 MG PO TABS: 8.0000 mg | ORAL_TABLET | Freq: Two times a day (BID) | ORAL | 1 refills | Status: DC | PRN

## 2017-11-03 NOTE — Progress Notes (Signed)
Hematology/Oncology Consult note [facility] [phone] [phone]   Patient Care Team: Albina Billet, MD as PCP - General (Internal Medicine)  REFERRING PROVIDER: Dr.Brandon CHIEF COMPLAINTS/REASON FOR VISIT:  Evaluation of bladder cancer  HISTORY OF PRESENTING ILLNESS:  Jordan [name] is a  67 y.o.  male with PMH listed below who was referred to me for evaluation of newly discovered bladder cancer.  He was recently admitted from 8/17-8/27 with bilateral hydronephrosis and acute renal failure requiring dialysis due to bilateral ureteral obstruction. Bilateral nephrostomy tubes were placed while he was inpatient. He followed up in the urology office and had a cystoscopy that was concerning for bladder. He underwent TURBT and bilateral ureteral stent placement on 10/23/17. Surgical pathology showed high grade carcinoma Image was independently revived by me.  09/22/2017 CT abdomen pelvis wo  Bilateral symmetric mild hydroureteronephrosis. Foley catheter present within a collapsed bladder as there is increased density material within the bladder possibly hemorrhagic debris. There is also increased density material over the left pelvis with some crossing midline adjacent both distal ureters left worse than right likely hemorrhagic debris. These factors may be responsible for the Hydroureteronephrosis.  Small bilateral pleural effusions with associated bibasilar atelectasis. 1.4 cm right renal cyst. Previous partial right colectomy.Left femoral venous catheter with tip over the external iliac vein near the junction to common iliac vein. 10/30/2017 CT chest wo contrast  1. No evidence of metastatic disease. 2. Bilateral hydronephrosis and double-J ureteral stents, partially imaged. 3. Coronary artery calcification  10/23/2017 Cystoscopy showed  diffuse nodular, edematous, and areas of papillary tumor involving the majority of the surface of the bladder, at  least 70% of the surface area greater than 5 cm.  The trigone itself is also distorted and nodular. On both sides, there was a high-grade distal ureteral obstruction with a very small caliber ureter which started at the level of the iliacs and extended all the way down to level of the bladder.  This is highly concerning for extrinsic compression of the ureter.s/p TUBRT, ureteral stent placed.   # Type 1 Diabetes,poorly controlled, recently admitted due to DKA. Chronic Paroxysmal A fib, on reduced dose of Xarelto 15mg  daily. .  Bilateral hydronephrosis s/p percutaneous drainage tubes placed, TURBT and ureteral stenting.  Case was discussed on tumor board on 10/31/2017. Although patient did not have obvious metastatic disease, he does likely have locally advanced disease likely extending of the pelvic wall bilaterally which compresses his ureters. On both sides, there was a high-grade distal ureteral obstruction with a very small caliber ureter which started at the level of the iliacs and extended all the way down to level of the bladder.  This is highly concerning for extrinsic compression of the ureter.   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 Stage II Reports feeling weak, lower abdominal cramps.   Review of Systems  Constitutional: Positive for malaise/fatigue and weight loss. Negative for chills and fever.  HENT: Negative for sore throat.   Eyes: Negative for redness.  Respiratory: Negative for cough.   Cardiovascular: Negative for chest pain and leg swelling.  Gastrointestinal: Negative for diarrhea and melena.  Musculoskeletal: Negative for myalgias.  Skin: Negative for rash.  Neurological: Negative for dizziness.  Endo/Heme/Allergies: Does not bruise/bleed easily.  Psychiatric/Behavioral: Negative for hallucinations.    MEDICAL HISTORY:  Past Medical History:  Diagnosis Date  . Atrial fibrillation ([place])   . Cancer ([place])    a. 06/2011  b. s/p  right hemicolectomy/ colon CA  . Colon polyps   . Dysrhythmia   . Hypertension   . Hyperthyroidism    a. 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 Stroke ([place]) 03/2012   a. residual right sided weakness  . Type 1 diabetes ([place])    a. on insulin b. prior DKA     SURGICAL HISTORY: Past Surgical History:  Procedure Laterality Date  . CARDIOVERSION N/A 03/25/2014   Procedure: CARDIOVERSION;  Surgeon: Lelon Perla, MD;  Location: [facility] ENDOSCOPY;  Service: Cardiovascular;  Laterality: N/A;  . COLONOSCOPY    . COLONOSCOPY WITH PROPOFOL N/A 07/24/2017   Procedure: COLONOSCOPY WITH PROPOFOL;  Surgeon: Toledo, Benay Pike, MD;  Location: [facility] ENDOSCOPY;  Service: Gastroenterology;  Laterality: N/A;  . CYSTOSCOPY W/ RETROGRADES Bilateral 10/23/2017   Procedure: CYSTOSCOPY WITH RETROGRADE PYELOGRAM;  Surgeon: Hollice Espy, MD;  Location: [facility] ORS;  Service: Urology;  Laterality: Bilateral;  . CYSTOSCOPY WITH RETROGRADE URETHROGRAM Bilateral 10/23/2017   Procedure: CYSTOSCOPY WITH antegrade nephrostogram;  Surgeon: Hollice Espy, MD;  Location: [facility] ORS;  Service: Urology;  Laterality: Bilateral;  . CYSTOSCOPY WITH STENT PLACEMENT Bilateral 10/23/2017   Procedure: CYSTOSCOPY WITH STENT PLACEMENT;  Surgeon: Hollice Espy, MD;  Location: [facility] ORS;  Service: Urology;  Laterality: Bilateral;  . HEMICOLECTOMY Right   . IR NEPHROSTOMY PLACEMENT LEFT  09/22/2017  . IR NEPHROSTOMY PLACEMENT RIGHT  09/22/2017  . TRANSURETHRAL RESECTION OF BLADDER TUMOR N/A 10/23/2017   Procedure: TRANSURETHRAL RESECTION OF BLADDER TUMOR (TURBT);  Surgeon: Hollice Espy, MD;  Location: [facility] ORS;  Service: Urology;  Laterality: N/A;    SOCIAL HISTORY: Social History   Socioeconomic History  . Marital status: Single    Spouse name: Not on file  . Number of children: 0  . Years of education: Not on file  . Highest education level: Not on file  Occupational History  . Not on file  Social Needs    . Financial resource strain: Not hard at all  . Food insecurity:    Worry: Never true    Inability: Never true  . Transportation needs:    Medical: Yes    Non-medical: Yes  Tobacco Use  . Smoking status: Former Smoker    Years: 10.00    Types: Cigarettes    Last attempt to quit: 09/21/1973    Years since quitting: 44.1  . Smokeless tobacco: Never Used  Substance and Sexual Activity  . Alcohol use: No  . Drug use: No  . Sexual activity: Not Currently  Lifestyle  . Physical activity:    Days per week: Patient refused    Minutes per session: Patient refused  . Stress: Not on file  Relationships  . Social connections:    Talks on phone: Once a week    Gets together: Once a week    Attends religious service: 1 to 4 times per year    Active member of club or organization: No    Attends meetings of clubs or organizations: Never    Relationship status: Never married  . Intimate partner violence:    Fear of current or ex partner: Patient refused    Emotionally abused: Patient refused    Physically abused: Patient refused    Forced sexual activity: Patient refused  Other Topics Concern  . Not on file  Social History Narrative  . Not on file    FAMILY HISTORY: Family History  Problem Relation Age of Onset  .  Heart attack Mother   . Diabetes Mother   . Hypertension Mother   . Heart attack Father   . Colon cancer Brother   . Lung cancer Brother     ALLERGIES:  is allergic to amiodarone.  MEDICATIONS:  Current Outpatient Medications  Medication Sig Dispense Refill  . acetaminophen (TYLENOL) 325 MG tablet Take 2 tablets (650 mg total) by mouth every 6 (six) hours as needed for mild pain (or Fever >/= 101).    Marland Kitchen amLODipine (NORVASC) 10 MG tablet Take 10 mg by mouth daily.    Marland Kitchen atorvastatin (LIPITOR) 40 MG tablet Take 40 mg by mouth daily.    Marland Kitchen HYDROcodone-acetaminophen (NORCO/VICODIN) 5-325 MG tablet Take 1-2 tablets by mouth every 6 (six) hours as needed for moderate  pain. 20 tablet 0  . loratadine (CLARITIN) 10 MG tablet Take 1 tablet (10 mg total) by mouth daily.    . Multiple Vitamin (MULTIVITAMIN WITH MINERALS) TABS tablet Take 1 tablet by mouth daily.    . polyethylene glycol (MIRALAX / GLYCOLAX) packet Take 17 g by mouth daily.    Marland Kitchen senna-docusate (SENOKOT-S) 8.6-50 MG tablet Take 1 tablet by mouth daily.    . Simethicone (MYLANTA GAS PO) Take by mouth.    . tamsulosin (FLOMAX) 0.4 MG CAPS capsule Take 1 capsule (0.4 mg total) by mouth daily after supper. 30 capsule   . traZODone (DESYREL) 50 MG tablet Take 50 mg by mouth at bedtime.    Marland Kitchen amiodarone (PACERONE) 200 MG tablet Take 200 mg by mouth daily.  0  . cephALEXin (KEFLEX) 500 MG capsule Take 1 capsule (500 mg total) by mouth 3 (three) times daily for 7 days. (Patient not taking: Reported on 10/31/2017) 21 capsule 0  . digoxin 62.5 MCG TABS Take 0.0625 mg by mouth daily. (Patient not taking: Reported on 10/31/2017)    . docusate sodium (COLACE) 100 MG capsule Take 1 capsule (100 mg total) by mouth 2 (two) times daily. (Patient not taking: Reported on 10/31/2017) 60 capsule 0  . famotidine (PEPCID) 20 MG tablet Take 1 tablet (20 mg total) by mouth daily. (Patient not taking: Reported on 10/31/2017)    . folic acid (FOLVITE) 1 MG tablet Take 1 mg by mouth daily.    . insulin aspart (NOVOLOG) 100 UNIT/ML injection Inject 10 Units into the skin 3 (three) times daily with meals. (Patient not taking: Reported on 10/31/2017) 10 mL 11  . insulin aspart (NOVOLOG) 100 UNIT/ML injection Inject 0-15 Units into the skin 4 (four) times daily -  before meals and at bedtime. CBG < 70: implement hypoglycemia protocol CBG 70 - 120: 0 units CBG 121 - 150: 2 units CBG 151 - 200: 3 units CBG 201 - 250: 5 units CBG 251 - 300: 8 units CBG 301 - 350: 11 units CBG 351 - 400: 15 units CBG > 400: call MD and obtain STAT lab verification (Patient not taking: Reported on 10/31/2017) 10 mL 11  . insulin glargine (LANTUS) 100  UNIT/ML injection Inject 0.37 mLs (37 Units total) into the skin daily. (Patient not taking: Reported on 10/31/2017) 10 mL 11  . metoprolol tartrate (LOPRESSOR) 25 MG tablet Take 1 tablet (25 mg total) by mouth 2 (two) times daily. (Patient not taking: Reported on 10/31/2017)    . oxybutynin (DITROPAN) 5 MG tablet Take 1 tablet (5 mg total) by mouth every 8 (eight) hours as needed for bladder spasms. (Patient not taking: Reported on 10/31/2017) 30 tablet 0  . protein  supplement shake (PREMIER PROTEIN) LIQD Take 325 mLs (11 oz total) by mouth 2 (two) times daily between meals. (Patient not taking: Reported on 10/31/2017)  0  . Rivaroxaban (XARELTO) 15 MG TABS tablet Take 15 mg by mouth daily.    . vitamin C (VITAMIN C) 250 MG tablet Take 1 tablet (250 mg total) by mouth 2 (two) times daily. (Patient not taking: Reported on 10/31/2017)     No current facility-administered medications for this visit.      PHYSICAL EXAMINATION: ECOG PERFORMANCE STATUS: 2 - Symptomatic, <50% confined to bed Vitals:   10/31/17 1554  BP: 127/64  Pulse: 64  Resp: 16  Temp: 97.6 F (36.4 C)   Filed Weights    Physical Exam  Constitutional: He is oriented to person, place, and time. No distress.  HENT:  Head: Normocephalic and atraumatic.  Mouth/Throat: No oropharyngeal exudate.  Eyes: Pupils are equal, round, and reactive to light. EOM are normal. No scleral icterus.  Neck: Normal range of motion. Neck supple.  Cardiovascular: Normal rate and regular rhythm.  No murmur heard. Pulmonary/Chest: Effort normal and breath sounds normal.  Abdominal: Soft. Bowel sounds are normal.  Genitourinary:  Genitourinary Comments: Bilateral percutaneous nephrostomy tubes.   Musculoskeletal: Normal range of motion. He exhibits no edema.  Neurological: He is alert and oriented to person, place, and time. No cranial nerve deficit.  Skin: Skin is warm and dry. No erythema.     LABORATORY DATA:  I have reviewed the data as  listed Lab Results  Component Value Date   WBC 10.2 10/31/2017   HGB 8.3 (L) 10/31/2017   HCT 24.4 (L) 10/31/2017   MCV 88.3 10/31/2017   PLT 662 (H) 10/31/2017   Recent Labs    09/23/17 0425  09/26/17 0502  10/01/17 0818  10/26/17 0101 10/27/17 0520 10/31/17 1630  NA 146*   < > 152*   < > 135   < > 135 138 129*  K 3.3*   < > 3.4*   < > 5.0   < > 4.5 4.1 4.5  CL 101   < > 115*   < > 101   < > 105 107 102  CO2 25   < > 27   < > 25   < > 20* 22 18*  GLUCOSE 227*   < > 211*   < > 310*   < > 405* 251* 337*  BUN 80*   < > 25*   < > 32*   < > 35* 34* 41*  CREATININE 7.89*   < > 2.32*   < > 2.00*   < > 2.25* 2.31* 3.30*  CALCIUM 8.2*   < > 8.9   < > 8.7*   < > 8.5* 8.6* 8.6*  GFRNONAA 6*   < > 28*   < > 33*   < > 28* 28* 18*  GFRAA 7*   < > 32*   < > 38*   < > 33* 32* 21*  PROT 6.0*  --  6.7  --   --   --   --   --  7.7  ALBUMIN 2.3*  --  2.8*  --  3.0*  --   --   --  2.7*  AST 12*  --  20  --   --   --   --   --  18  ALT 12  --  12  --   --   --   --   --  15  ALKPHOS 80  --  80  --   --   --   --   --  103  BILITOT 1.7*  --  1.0  --   --   --   --   --  0.4   < > = values in this interval not displayed.   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 Acute kidney injury ([place])   3. Chronic anticoagulation   4. Anemia, unspecified type   5. Thrombocytosis ([place])    Images were independently reviewed by me and discussed with patient.  Also discussed about urothelia cancer diagnosis.  The diagnosis and care plan were discussed with patient in detail.   He is a poor surgical candidate due to multiple comorbidity. Not cisplatin candidate due to impaired kidney function.  Case discussed at tumor board. Consensus reached to start with concurrent chemotherapy and Radiation and re-evaluate.  \  Recommend gemcitabine with concurrent RT.   We had discussed the composition of chemotherapy regimen, length of chemo cycle, duration of treatment and the time to assess response to treatment.  Supportive care measures are necessary for patient well-being and will be provided as necessary. I explained to the patient the risks and benefits of chemotherapy including all but not limited to hair loss, mouth sore, nausea, vomiting, low blood counts, bleeding, and risk of life threatening infection and even death, secondary malignancy etc.  . Patient voices understanding and willing to proceed chemotherapy. We spent sufficient time to discuss many aspect of care, questions were answered to patient's satisfaction. Refer to RadOnc for evaluation.  # Chemotherapy education; port placement. Hopefully the planned start chemotherapy next week. Antiemetics-Zofran and Compazine; EMLA cream sent to pharmacy  # A-Fib on chronic anticoagulation with Evalina Field, 15mg  daily.  # Impaired kidney function, repeat kidney function today. Creatine trending up. Repeat kidney function next week.  # Anemia, monitor hemoglobin.  # Thrombocytosis, likely reactive. Check iron panel.   # Personal history of both colon cancer and urothelia cancer, refer to genetic counselor.   At risk of stent failure, so percutaneous nephrostomy tubes were kept and capped and can be re-open if rental function rises.  Orders Placed This Encounter  Procedures  . CBC with Differential/Platelet    Standing Status:   Future    Number of Occurrences:   1    Standing Expiration Date:   11/01/2018  . Comprehensive metabolic panel    Standing Status:   Future    Number of Occurrences:   1    Standing Expiration Date:   11/01/2018  . Ambulatory referral to Vascular Surgery    Referral Priority:   Routine    Referral Type:   Surgical    Referral Reason:   Specialty Services Required    Referred to Provider:   Algernon Huxley, MD    Requested  Specialty:   Vascular Surgery    Number of Visits Requested:   1  . Ambulatory referral to Radiation Oncology    Referral Priority:   Routine    Referral Type:   Consultation    Referral Reason:   Specialty Services Required    Referred to Provider:   Noreene Filbert, MD    Requested  Specialty:   Radiation Oncology    Number of Visits Requested:   1    All questions were answered. The patient knows to call the clinic with any problems questions or concerns.  Return of visit: approximately 2 weeks when RT start. .  Thank you for this kind referral and the opportunity to participate in the care of this patient. A copy of today's note is routed to referring provider  Total face to face encounter time for this patient visit was 70 min. >50% of the time was  spent in counseling and coordination of care.    Earlie Server, MD, PhD Hematology Oncology [facility] Pager- [phone] 11/03/2017

## 2017-11-03 NOTE — Progress Notes (Signed)
START ON PATHWAY REGIMEN - Bladder   Gemcitabine 27 mg/m2 IV Twice Weekly + RT x 4 Weeks (Induction):   One induction cycle is 4 weeks:     Gemcitabine   **Always confirm dose/schedule in your pharmacy ordering system**  Gemcitabine 27 mg/m2 IV Twice Weekly + RT x 2.5 Weeks (Consolidation):   One consolidation cycle is 2.5 weeks:     Gemcitabine   **Always confirm dose/schedule in your pharmacy ordering system**  Patient Characteristics: Pre Cystectomy, Clinical T2-T4a, N0-1, M0, Cystectomy Ineligible/Patient Refuses Cystectomy AJCC M Category: M0 AJCC N Category: NX AJCC T Category: TX Current evidence of distant metastases<= No AJCC 8 Stage Grouping: Unknown Intent of Therapy: Non-Curative / Palliative Intent, Discussed with Patient

## 2017-11-04 ENCOUNTER — Inpatient Hospital Stay
Admission: EM | Admit: 2017-11-04 | Discharge: 2017-11-11 | DRG: 673 | Disposition: A | Discharge status: Skilled Nursing Facility | Payer: Medicare Other | Attending: Specialist | Admitting: Specialist

## 2017-11-04 ENCOUNTER — Encounter: Payer: Self-pay | Admitting: *Deleted

## 2017-11-04 ENCOUNTER — Telehealth: Payer: Self-pay | Admitting: Genetic Counselor

## 2017-11-04 ENCOUNTER — Emergency Department: Payer: Medicare Other

## 2017-11-04 DIAGNOSIS — E871 Hypo-osmolality and hyponatremia: Secondary | ICD-10-CM | POA: Diagnosis present

## 2017-11-04 DIAGNOSIS — E875 Hyperkalemia: Secondary | ICD-10-CM | POA: Diagnosis present

## 2017-11-04 DIAGNOSIS — B9620 Unspecified Escherichia coli [E. coli] as the cause of diseases classified elsewhere: Secondary | ICD-10-CM | POA: Diagnosis present

## 2017-11-04 DIAGNOSIS — Z8249 Family history of ischemic heart disease and other diseases of the circulatory system: Secondary | ICD-10-CM

## 2017-11-04 DIAGNOSIS — R531 Weakness: Secondary | ICD-10-CM | POA: Diagnosis present

## 2017-11-04 DIAGNOSIS — Z888 Allergy status to other drugs, medicaments and biological substances status: Secondary | ICD-10-CM

## 2017-11-04 DIAGNOSIS — E785 Hyperlipidemia, unspecified: Secondary | ICD-10-CM | POA: Diagnosis present

## 2017-11-04 DIAGNOSIS — Z79899 Other long term (current) drug therapy: Secondary | ICD-10-CM

## 2017-11-04 DIAGNOSIS — N139 Obstructive and reflux uropathy, unspecified: Principal | ICD-10-CM | POA: Diagnosis present

## 2017-11-04 DIAGNOSIS — E1022 Type 1 diabetes mellitus with diabetic chronic kidney disease: Secondary | ICD-10-CM | POA: Diagnosis present

## 2017-11-04 DIAGNOSIS — N136 Pyonephrosis: Secondary | ICD-10-CM | POA: Diagnosis present

## 2017-11-04 DIAGNOSIS — Z794 Long term (current) use of insulin: Secondary | ICD-10-CM

## 2017-11-04 DIAGNOSIS — C680 Malignant neoplasm of urethra: Secondary | ICD-10-CM

## 2017-11-04 DIAGNOSIS — R8290 Unspecified abnormal findings in urine: Secondary | ICD-10-CM | POA: Diagnosis not present

## 2017-11-04 DIAGNOSIS — R310 Gross hematuria: Secondary | ICD-10-CM | POA: Diagnosis not present

## 2017-11-04 DIAGNOSIS — E0590 Thyrotoxicosis, unspecified without thyrotoxic crisis or storm: Secondary | ICD-10-CM | POA: Diagnosis present

## 2017-11-04 DIAGNOSIS — E872 Acidosis: Secondary | ICD-10-CM | POA: Diagnosis present

## 2017-11-04 DIAGNOSIS — C679 Malignant neoplasm of bladder, unspecified: Secondary | ICD-10-CM | POA: Diagnosis present

## 2017-11-04 DIAGNOSIS — N131 Hydronephrosis with ureteral stricture, not elsewhere classified: Secondary | ICD-10-CM | POA: Diagnosis not present

## 2017-11-04 DIAGNOSIS — I69320 Aphasia following cerebral infarction: Secondary | ICD-10-CM | POA: Diagnosis not present

## 2017-11-04 DIAGNOSIS — D62 Acute posthemorrhagic anemia: Secondary | ICD-10-CM | POA: Diagnosis present

## 2017-11-04 DIAGNOSIS — D509 Iron deficiency anemia, unspecified: Secondary | ICD-10-CM | POA: Diagnosis present

## 2017-11-04 DIAGNOSIS — I4891 Unspecified atrial fibrillation: Secondary | ICD-10-CM | POA: Diagnosis present

## 2017-11-04 DIAGNOSIS — E1065 Type 1 diabetes mellitus with hyperglycemia: Secondary | ICD-10-CM | POA: Diagnosis not present

## 2017-11-04 DIAGNOSIS — Z7901 Long term (current) use of anticoagulants: Secondary | ICD-10-CM

## 2017-11-04 DIAGNOSIS — N390 Urinary tract infection, site not specified: Secondary | ICD-10-CM

## 2017-11-04 DIAGNOSIS — I129 Hypertensive chronic kidney disease with stage 1 through stage 4 chronic kidney disease, or unspecified chronic kidney disease: Secondary | ICD-10-CM | POA: Diagnosis present

## 2017-11-04 DIAGNOSIS — N189 Chronic kidney disease, unspecified: Secondary | ICD-10-CM | POA: Diagnosis not present

## 2017-11-04 DIAGNOSIS — N1330 Unspecified hydronephrosis: Secondary | ICD-10-CM

## 2017-11-04 DIAGNOSIS — Z833 Family history of diabetes mellitus: Secondary | ICD-10-CM

## 2017-11-04 DIAGNOSIS — Z86010 Personal history of colonic polyps: Secondary | ICD-10-CM

## 2017-11-04 DIAGNOSIS — Z9049 Acquired absence of other specified parts of digestive tract: Secondary | ICD-10-CM

## 2017-11-04 DIAGNOSIS — Z436 Encounter for attention to other artificial openings of urinary tract: Secondary | ICD-10-CM | POA: Diagnosis not present

## 2017-11-04 DIAGNOSIS — G9341 Metabolic encephalopathy: Secondary | ICD-10-CM | POA: Diagnosis present

## 2017-11-04 DIAGNOSIS — D649 Anemia, unspecified: Secondary | ICD-10-CM

## 2017-11-04 DIAGNOSIS — C689 Malignant neoplasm of urinary organ, unspecified: Secondary | ICD-10-CM

## 2017-11-04 DIAGNOSIS — Z87891 Personal history of nicotine dependence: Secondary | ICD-10-CM

## 2017-11-04 DIAGNOSIS — N183 Chronic kidney disease, stage 3 (moderate): Secondary | ICD-10-CM | POA: Diagnosis present

## 2017-11-04 DIAGNOSIS — N179 Acute kidney failure, unspecified: Secondary | ICD-10-CM | POA: Diagnosis present

## 2017-11-04 DIAGNOSIS — E860 Dehydration: Secondary | ICD-10-CM | POA: Diagnosis present

## 2017-11-04 DIAGNOSIS — I69351 Hemiplegia and hemiparesis following cerebral infarction affecting right dominant side: Secondary | ICD-10-CM

## 2017-11-04 DIAGNOSIS — Z85038 Personal history of other malignant neoplasm of large intestine: Secondary | ICD-10-CM

## 2017-11-04 DIAGNOSIS — Z800 Family history of malignant neoplasm of digestive organs: Secondary | ICD-10-CM

## 2017-11-04 DIAGNOSIS — Z801 Family history of malignant neoplasm of trachea, bronchus and lung: Secondary | ICD-10-CM

## 2017-11-04 HISTORY — DX: Other specified health status: Z78.9

## 2017-11-04 HISTORY — DX: Problems related to living in residential institution: Z59.3

## 2017-11-04 LAB — COMPREHENSIVE METABOLIC PANEL
ALK PHOS: 99 U/L (ref 38–126)
ALT: 12 U/L (ref 0–44)
AST: 14 U/L — ABNORMAL LOW (ref 15–41)
Albumin: 2.5 g/dL — ABNORMAL LOW (ref 3.5–5.0)
Anion gap: 12 (ref 5–15)
BUN: 70 mg/dL — ABNORMAL HIGH (ref 8–23)
CALCIUM: 8.2 mg/dL — AB (ref 8.9–10.3)
CO2: 17 mmol/L — ABNORMAL LOW (ref 22–32)
CREATININE: 7.89 mg/dL — AB (ref 0.61–1.24)
Chloride: 98 mmol/L (ref 98–111)
GFR, EST AFRICAN AMERICAN: 7 mL/min — AB (ref >60)
GFR, EST NON AFRICAN AMERICAN: 6 mL/min — AB (ref >60)
Glucose, Bld: 228 mg/dL — ABNORMAL HIGH (ref 70–99)
Potassium: 4.6 mmol/L (ref 3.5–5.1)
Sodium: 127 mmol/L — ABNORMAL LOW (ref 135–145)
Total Bilirubin: 0.5 mg/dL (ref 0.3–1.2)
Total Protein: 6.9 g/dL (ref 6.5–8.1)

## 2017-11-04 LAB — URINALYSIS, COMPLETE (UACMP) WITH MICROSCOPIC
RBC / HPF: >50 RBC/hpf — ABNORMAL HIGH (ref 0–5)
SPECIFIC GRAVITY, URINE: 1.014 (ref 1.005–1.030)
Squamous Epithelial / LPF: NONE SEEN (ref 0–5)

## 2017-11-04 LAB — CBC WITH DIFFERENTIAL/PLATELET
Basophils Absolute: 0.1 10*3/uL (ref 0–0.1)
Basophils Relative: 1 %
Eosinophils Absolute: 0.1 10*3/uL (ref 0–0.7)
Eosinophils Relative: 1 %
HCT: 22.1 % — ABNORMAL LOW (ref 40.0–52.0)
HEMOGLOBIN: 7.6 g/dL — AB (ref 13.0–18.0)
LYMPHS ABS: 0.9 10*3/uL — AB (ref 1.0–3.6)
LYMPHS PCT: 7 %
MCH: 29.3 pg (ref 26.0–34.0)
MCHC: 34.3 g/dL (ref 32.0–36.0)
MCV: 85.5 fL (ref 80.0–100.0)
MONOS PCT: 9 %
Monocytes Absolute: 1.1 10*3/uL — ABNORMAL HIGH (ref 0.2–1.0)
NEUTROS PCT: 82 %
Neutro Abs: 10.2 10*3/uL — ABNORMAL HIGH (ref 1.4–6.5)
Platelets: 606 10*3/uL — ABNORMAL HIGH (ref 150–440)
RBC: 2.59 MIL/uL — AB (ref 4.40–5.90)
RDW: 14.3 % (ref 11.5–14.5)
WBC: 12.4 10*3/uL — AB (ref 3.8–10.6)

## 2017-11-04 LAB — LACTIC ACID, PLASMA: LACTIC ACID, VENOUS: 1.3 mmol/L (ref 0.5–1.9)

## 2017-11-04 LAB — APTT: APTT: 63 s — AB (ref 24–36)

## 2017-11-04 LAB — PROTIME-INR
INR: 2.49
Prothrombin Time: 26.7 seconds — ABNORMAL HIGH (ref 11.4–15.2)

## 2017-11-04 LAB — DIGOXIN LEVEL: DIGOXIN LVL: 1.2 ng/mL (ref 0.8–2.0)

## 2017-11-04 MED ORDER — SODIUM CHLORIDE 0.9 % IV BOLUS
1000.0000 mL | Freq: Once | INTRAVENOUS | Status: AC
  Administered 2017-11-04: 1000 mL via INTRAVENOUS

## 2017-11-04 MED ORDER — SODIUM CHLORIDE 0.9 % IV SOLN
10.0000 mL/h | Freq: Once | INTRAVENOUS | Status: AC
  Administered 2017-11-05: 16:00:00 10 mL/h via INTRAVENOUS

## 2017-11-04 MED ORDER — PIPERACILLIN-TAZOBACTAM 3.375 G IVPB 30 MIN
3.3750 g | Freq: Once | INTRAVENOUS | Status: AC
  Administered 2017-11-04: 3.375 g via INTRAVENOUS
  Filled 2017-11-04: qty 50

## 2017-11-04 MED ORDER — VANCOMYCIN HCL IN DEXTROSE 1-5 GM/200ML-% IV SOLN
1000.0000 mg | Freq: Once | INTRAVENOUS | Status: AC
  Administered 2017-11-04: 1000 mg via INTRAVENOUS
  Filled 2017-11-04: qty 200

## 2017-11-04 MED ORDER — SODIUM CHLORIDE 0.9 % IV SOLN: Freq: Once | INTRAVENOUS | Status: DC

## 2017-11-04 NOTE — ED Provider Notes (Signed)
[facility] Emergency Department Provider Note  ____________________________________________  Time seen: Approximately 8:43 PM  I have reviewed the triage vital signs and the nursing notes.   HISTORY  Chief Complaint Abnormal Lab  Level 5 caveat:  Portions of the history and physical were unable to be obtained due to confusion   HPI Izacc Demeyer Oriordan is a 67 y.o. male with a history of colon cancer status post right colectomy and recent diagnosis of bladder cancer status post bilateral nephrostomy tubes, bilateral ureteral stenting and Foley catheter, also history of atrial fibrillation on Xarelto who presents for evaluation of confusion, abnormal labs, and hematuria.  Patient is slightly confused, denies abdominal pain, flank pain, dizziness, dysuria, nausea, vomiting.  According to the nursing home they check labs today and found the patient's creatinine was 6.99 with a BUN in the 50s.  Patient also had worsening anemia.  They report that since earlier this morning he has had very dark urine in the Foley bag. No fever per EMS.  Past Medical History:  Diagnosis Date  . Atrial fibrillation ([place])   . Cancer ([place])    a. 06/2011 b. s/p right hemicolectomy/ colon CA  . Colon polyps   . Dysrhythmia   . Hypertension   . Hyperthyroidism    a. 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 Nursing home resident   . Stroke ([place]) 03/2012   a. residual right sided weakness  . Type 1 diabetes ([place])    a. on insulin b. prior DKA     Patient Active Problem List   Diagnosis Date Noted  . Goals of care, counseling/discussion 11/03/2017  . Anemia 11/03/2017  . Chronic anticoagulation 11/03/2017  . Thrombocytosis ([place]) 11/03/2017  . 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 Urinary tract infection 10/25/2017  . Acute kidney injury ([place]) 09/21/2017  . Hyperthyroidism 03/30/2014  . Type I diabetes mellitus ([place]) 03/30/2014  . Hyperglycemia 03/30/2014  .  Thyrotoxicosis 03/30/2014  . History of stroke 03/30/2014  . Malnutrition of moderate degree ([place]) 03/28/2014  . Atrial fibrillation with rapid ventricular response ([place]) 03/19/2014    Past Surgical History:  Procedure Laterality Date  . CARDIOVERSION N/A 03/25/2014   Procedure: CARDIOVERSION;  Surgeon: Lelon Perla, MD;  Location: [facility] ENDOSCOPY;  Service: Cardiovascular;  Laterality: N/A;  . COLONOSCOPY    . COLONOSCOPY WITH PROPOFOL N/A 07/24/2017   Procedure: COLONOSCOPY WITH PROPOFOL;  Surgeon: Tol[place], Benay Pike, MD;  Location: [facility] ENDOSCOPY;  Service: Gastroenterology;  Laterality: N/A;  . CYSTOSCOPY W/ RETROGRADES Bilateral 10/23/2017   Procedure: CYSTOSCOPY WITH RETROGRADE PYELOGRAM;  Surgeon: Hollice Espy, MD;  Location: [facility] ORS;  Service: Urology;  Laterality: Bilateral;  . CYSTOSCOPY WITH RETROGRADE URETHROGRAM Bilateral 10/23/2017   Procedure: CYSTOSCOPY WITH antegrade nephrostogram;  Surgeon: Hollice Espy, MD;  Location: [facility] ORS;  Service: Urology;  Laterality: Bilateral;  . CYSTOSCOPY WITH STENT PLACEMENT Bilateral 10/23/2017   Procedure: CYSTOSCOPY WITH STENT PLACEMENT;  Surgeon: Hollice Espy, MD;  Location: [facility] ORS;  Service: Urology;  Laterality: Bilateral;  . HEMICOLECTOMY Right   . IR NEPHROSTOMY PLACEMENT LEFT  09/22/2017  . IR NEPHROSTOMY PLACEMENT RIGHT  09/22/2017  . TRANSURETHRAL RESECTION OF BLADDER TUMOR N/A 10/23/2017   Procedure: TRANSURETHRAL RESECTION OF BLADDER TUMOR (TURBT);  Surgeon: Hollice Espy, MD;  Location: [facility] ORS;  Service: Urology;  Laterality: N/A;    Prior to Admission medications   Medication Sig Start Date End Date Taking? Authorizing Provider  acetaminophen (TYLENOL)  325 MG tablet Take 2 tablets (650 mg total) by mouth every 6 (six) hours as needed for mild pain (or Fever >/= 101). 10/01/17   N[place]oles Mango, MD  amiodarone (PACERONE) 200 MG tablet Take 200 mg by mouth daily. 10/04/17   [provider]  amLODipine (NORVASC) 10  MG tablet Take 10 mg by mouth daily.    [provider]  atorvastatin (LIPITOR) 40 MG tablet Take 40 mg by mouth daily.    [provider]  cephALEXin (KEFLEX) 500 MG capsule Take 1 capsule (500 mg total) by mouth 3 (three) times daily for 7 days. Patient not taking: Reported on 10/31/2017 10/28/17 11/04/17  Saundra Shelling, MD  digoxin 62.5 MCG TABS Take 0.0625 mg by mouth daily. Patient not taking: Reported on 10/31/2017 10/01/17   [place] Mango, MD  docusate sodium (COLACE) 100 MG capsule Take 1 capsule (100 mg total) by mouth 2 (two) times daily. Patient not taking: Reported on 10/31/2017 10/23/17   Hollice Espy, MD  famotidine (PEPCID) 20 MG tablet Take 1 tablet (20 mg total) by mouth daily. Patient not taking: Reported on 10/31/2017 10/02/17   [place] Mango, MD  folic acid (FOLVITE) 1 MG tablet Take 1 mg by mouth daily.    [provider]  HYDROcodone-acetaminophen (NORCO/VICODIN) 5-325 MG tablet Take 1-2 tablets by mouth every 6 (six) hours as needed for moderate pain. 10/28/17   Saundra Shelling, MD  insulin aspart (NOVOLOG) 100 UNIT/ML injection Inject 10 Units into the skin 3 (three) times daily with meals. Patient not taking: Reported on 10/31/2017 10/01/17   [place]oles Mango, MD  insulin aspart (NOVOLOG) 100 UNIT/ML injection Inject 0-15 Units into the skin 4 (four) times daily -  before meals and at bedtime. CBG < 70: implement hypoglycemia protocol CBG 70 - 120: 0 units CBG 121 - 150: 2 units CBG 151 - 200: 3 units CBG 201 - 250: 5 units CBG 251 - 300: 8 units CBG 301 - 350: 11 units CBG 351 - 400: 15 units CBG > 400: call MD and obtain STAT lab verification Patient not taking: Reported on 10/31/2017 10/01/17   N[place]oles Mango, MD  insulin glargine (LANTUS) 100 UNIT/ML injection Inject 0.37 mLs (37 Units total) into the skin daily. Patient not taking: Reported on 10/31/2017 10/02/17   [place]oles Mango, MD  lidocaine-prilocaine (EMLA) cream Apply to affected area once 11/03/17    Earlie Server, MD  loratadine (CLARITIN) 10 MG tablet Take 1 tablet (10 mg total) by mouth daily. 10/02/17   N[place]oles Mango, MD  metoprolol tartrate (LOPRESSOR) 25 MG tablet Take 1 tablet (25 mg total) by mouth 2 (two) times daily. Patient not taking: Reported on 10/31/2017 10/01/17   [place]oles Mango, MD  Multiple Vitamin (MULTIVITAMIN WITH MINERALS) TABS tablet Take 1 tablet by mouth daily. 10/02/17   Gouru, Illene Silver, MD  ondansetron (ZOFRAN) 8 MG tablet Take 1 tablet (8 mg total) by mouth 2 (two) times daily as needed (Nausea or vomiting). 11/03/17   Earlie Server, MD  oxybutynin (DITROPAN) 5 MG tablet Take 1 tablet (5 mg total) by mouth every 8 (eight) hours as needed for bladder spasms. Patient not taking: Reported on 10/31/2017 10/23/17   Hollice Espy, MD  polyethylene glycol [facility] / Floria Raveling) packet Take 17 g by mouth daily.    [provider]  prochlorperazine (COMPAZINE) 10 MG tablet Take 1 tablet (10 mg total) by mouth every 6 (six) hours as needed (Nausea or vomiting). 11/03/17   Earlie Server, MD  protein  supplement shake (PREMIER PROTEIN) LIQD Take 325 mLs (11 oz total) by mouth 2 (two) times daily between meals. Patient not taking: Reported on 10/31/2017 10/01/17   [place]oles Mango, MD  Rivaroxaban (XARELTO) 15 MG TABS tablet Take 15 mg by mouth daily.    [provider]  senna-docusate (SENOKOT-S) 8.6-50 MG tablet Take 1 tablet by mouth daily.    [provider]  Simethicone (MYLANTA GAS PO) Take by mouth.    [provider]  tamsulosin (FLOMAX) 0.4 MG CAPS capsule Take 1 capsule (0.4 mg total) by mouth daily after supper. 10/01/17   N[place]oles Mango, MD  traZODone (DESYREL) 50 MG tablet Take 50 mg by mouth at bedtime.    [provider]  vitamin C (VITAMIN C) 250 MG tablet Take 1 tablet (250 mg total) by mouth 2 (two) times daily. Patient not taking: Reported on 10/31/2017 10/01/17   N[place]oles Mango, MD    Allergies Amiodarone  Family History  Problem Relation Age of Onset   . Heart attack Mother   . Diabetes Mother   . Hypertension Mother   . Heart attack Father   . Colon cancer Brother   . Lung cancer Brother     Social History Social History   Tobacco Use  . Smoking status: Former Smoker    Years: 10.00    Types: Cigarettes    Last attempt to quit: 09/21/1973    Years since quitting: 44.1  . Smokeless tobacco: Never Used  Substance Use Topics  . Alcohol use: No  . Drug use: No    Review of Systems  Constitutional: Negative for fever. + confusion Eyes: Negative for visual changes. ENT: Negative for sore throat. Neck: No neck pain  Cardiovascular: Negative for chest pain. Respiratory: Negative for shortness of breath. Gastrointestinal: Negative for abdominal pain, vomiting or diarrhea. Genitourinary: Negative for dysuria. + hematuria Musculoskeletal: Negative for back pain. Skin: Negative for rash. Neurological: Negative for headaches, weakness or numbness. Psych: No SI or HI  ____________________________________________   PHYSICAL EXAM:  VITAL SIGNS: ED Triage Vitals  Enc Vitals Group     BP 11/04/17 1927 (!) 150/69     Pulse Rate 11/04/17 1927 75     Resp 11/04/17 1927 16     Temp 11/04/17 1927 99.4 F (37.4 C)     Temp Source 11/04/17 1927 Oral     SpO2 11/04/17 1927 100 %     Weight --      Height --      Head Circumference --      Peak Flow --      Pain Score 11/04/17 1929 0     Pain Loc --      Pain Edu? --      Excl. in [place]? --     Constitutional: Alert and oriented x2. Well appearing and in no apparent distress. HEENT:      Head: Normocephalic and atraumatic.         Eyes: Conjunctivae are normal. Sclera is non-icteric.       Mouth/Throat: Mucous membranes are moist.       Neck: Supple with no signs of meningismus. Cardiovascular: Regular rate and rhythm. No murmurs, gallops, or rubs. 2+ symmetrical distal pulses are present in all extremities. No JVD. Respiratory: Normal respiratory effort. Lungs are clear  to auscultation bilaterally. No wheezes, crackles, or rhonchi.  Gastrointestinal: Soft, non tender, and non distended with positive bowel sounds. No rebound or guarding. Genitourinary: No CVA tenderness. Foley bag  draining gross hematuria. Bilateral nephrostomy tubes are capped and not connected to a bag. Musculoskeletal: Nontender with normal range of motion in all extremities. No edema, cyanosis, or erythema of extremities. Neurologic: Normal speech and language. Face is symmetric. Moving all extremities. No gross focal neurologic deficits are appreciated. Skin: Skin is warm, dry and intact. No rash noted. Psychiatric: Mood and affect are normal. Speech and behavior are normal.  ____________________________________________   LABS (all labs ordered are listed, but only abnormal results are displayed)  Labs Reviewed  CBC WITH DIFFERENTIAL/PLATELET - Abnormal; Notable for the following components:      Result Value   WBC 12.4 (*)    RBC 2.59 (*)    Hemoglobin 7.6 (*)    HCT 22.1 (*)    Platelets 606 (*)    Neutro Abs 10.2 (*)    Lymphs Abs 0.9 (*)    Monocytes Absolute 1.1 (*)    All other components within normal limits  COMPREHENSIVE METABOLIC PANEL - Abnormal; Notable for the following components:   Sodium 127 (*)    CO2 17 (*)    Glucose, Bld 228 (*)    BUN 70 (*)    Creatinine, Ser 7.89 (*)    Calcium 8.2 (*)    Albumin 2.5 (*)    AST 14 (*)    GFR calc non Af Amer 6 (*)    GFR calc Af Amer 7 (*)    All other components within normal limits  URINALYSIS, COMPLETE (UACMP) WITH MICROSCOPIC - Abnormal; Notable for the following components:   Color, Urine AMBER (*)    APPearance CLOUDY (*)    Glucose, UA   (*)    Value: TEST NOT REPORTED DUE TO COLOR INTERFERENCE OF URINE PIGMENT   Hgb urine dipstick   (*)    Value: TEST NOT REPORTED DUE TO COLOR INTERFERENCE OF URINE PIGMENT   Bilirubin Urine   (*)    Value: TEST NOT REPORTED DUE TO COLOR INTERFERENCE OF URINE PIGMENT    Ketones, ur   (*)    Value: TEST NOT REPORTED DUE TO COLOR INTERFERENCE OF URINE PIGMENT   Protein, ur   (*)    Value: TEST NOT REPORTED DUE TO COLOR INTERFERENCE OF URINE PIGMENT   Nitrite   (*)    Value: TEST NOT REPORTED DUE TO COLOR INTERFERENCE OF URINE PIGMENT   Leukocytes, UA   (*)    Value: TEST NOT REPORTED DUE TO COLOR INTERFERENCE OF URINE PIGMENT   RBC / HPF >50 (*)    WBC, UA >50 (*)    Bacteria, UA MANY (*)    All other components within normal limits  URINE CULTURE  LACTIC ACID, PLASMA  PROTIME-INR  APTT  DIGOXIN LEVEL  PREPARE RBC (CROSSMATCH)   ____________________________________________  EKG  none  ____________________________________________  RADIOLOGY  I have personally reviewed the images performed during this visit and I agree with the Radiologist's read.   Interpretation by Radiologist:  Ct Renal Stone Study  Result Date: 11/04/2017 CLINICAL DATA:  Pt has some confusion per ems and this is his baseline. Pt had lab work done and his creatinine was elevated and his hemoglobin has dropped. Pt has hx of GU cancer. Pt has Foley cath in place and the urine appears red. Patient underwent trans urethral resection of bladder tumor on 10/23/2017. EXAM: CT ABDOMEN AND PELVIS WITHOUT CONTRAST TECHNIQUE: Multidetector CT imaging of the abdomen and pelvis was performed following the standard protocol without IV contrast.  COMPARISON:  09/22/2017 FINDINGS: Lower chest: Small bilateral pleural effusions and bibasilar atelectasis. Heart size is normal. There is coronary artery calcification. Hepatobiliary: A 5 millimeter low-attenuation lesion is identified in the RIGHT hepatic lobe, not further characterized. No radiopaque gallstones, biliary dilatation, or pericholecystic inflammatory changes. Pancreas: Unremarkable. No pancreatic ductal dilatation or surrounding inflammatory changes. Spleen: Normal in size without focal abnormality. Adrenals/Urinary Tract: Adrenal glands  are normal in appearance. There is bilateral hydronephrosis. There are bilateral percutaneous nephrostomies. There are bilateral double-J ureteral stents traversing dilated ureters. A Foley catheter is in place. The degree of hydronephrosis is similar compared with previous exams. Stomach/Bowel: The stomach is normal in appearance. Small bowel loops are unremarkable. Status post ascending colectomy. Moderate stool within the transverse colon and sigmoid colon. There is thickening of the rectal wall, associated with stranding in the perirectal fat. Soft tissue density along the LEFT aspect of the rectum measures 2.1 x 5.1 centimeters and may represent inflammatory change or tumor. There is thickening along the distal portions of the ureters bilaterally, LEFT greater than RIGHT. Vascular/Lymphatic: There is atherosclerotic calcification of the abdominal aorta. No associated aneurysm. Enlarged periaortic lymph nodes are identified, measuring up to 11 millimeters at the level of the renal veins. Reproductive: Prostate is unremarkable. Other: No abdominal wall hernia or abnormality. No abdominopelvic ascites. Musculoskeletal: Degenerative changes are identified primarily at L5-S1. Degenerative changes are seen in both hips. IMPRESSION: 1. Bilateral percutaneous nephrostomies and bilateral ureteral stents appear in good position. 2. Degree of hydronephrosis appears similar to the previous exam, prior to stent placement. 3. Foley catheter decompresses the bladder. 4. Thickening of the rectum, consistent with infectious, inflammatory change, or tumor. Soft tissue along the LEFT aspect of the rectum likely represents tumor. 5. Coronary artery calcification. 6.  Aortic atherosclerosis.  (ICD10-I70.0) 7. Status post ascending colectomy. 8. Small bilateral effusions. Electronically Signed   By: Nolon Nations M.D.   On: 11/04/2017 20:11    ____________________________________________   PROCEDURES  Procedure(s)  performed: None Procedures Critical Care performed: yes  CRITICAL CARE Performed by: Rudene Re  ?  Total critical care time: 45 min  Critical care time was exclusive of separately billable procedures and treating other patients.  Critical care was necessary to treat or prevent imminent or life-threatening deterioration.  Critical care was time spent personally by me on the following activities: development of treatment plan with patient and/or surrogate as well as nursing, discussions with consultants, evaluation of patient's response to treatment, examination of patient, obtaining history from patient or surrogate, ordering and performing treatments and interventions, ordering and review of laboratory studies, ordering and review of radiographic studies, pulse oximetry and re-evaluation of patient's condition.  ____________________________________________   INITIAL IMPRESSION / ASSESSMENT AND PLAN / ED COURSE  67 y.o. male with a history of colon cancer status post right colectomy and recent diagnosis of bladder cancer status post bilateral nephrostomy tubes, bilateral ureteral stenting and Foley catheter, also history of atrial fibrillation on Xarelto who presents for evaluation of confusion, abnormal labs, and hematuria.  Patient alert and oriented x2, does endorse feeling slightly confused, he is hemodynamically stable with a low-grade temp of 99.12F.  Foley bag draining gross hematuria.  Bilateral nephrostomy tubes are capped and not connected to a bag.  Labs from the nursing home show worsening creatinine and hemoglobin.  CT renal was done to evaluate for any evidence of obstruction which could be causing the worsening creatinine.  The bilateral nephrostomy tubes and ureteral stents appear in  good position.  There is a mild degree of hydronephrosis bilaterally. Urology consulted and recommended uncapping nephrostomy tubes to help relieve obstruction. Labs confirm a worsening  creatinine from 3.3-7.89 with a bicarb of 17, normal okay, no anion gap.  I spoke with Dr. Zollie Scale who recommended hydration and connecting the nephrostomy tubes to bags after speaking with urology to allow for kidneys to hopefully respond. He did not recommend HD yet but will follow closely. [place] + for UTI will treat with Zosyn and Vanco for complicated UTI. Hgb trending dwon from 8.5 to 7.6, will transfuse. Will discuss with the hospitalist for admission.      As part of my medical decision making, I reviewed the following data within the [place] notes reviewed and incorporated, Labs reviewed , Old chart reviewed, Radiograph reviewed , Discussed with admitting physician , A consult was requested and obtained from this/these consultant(s) Nephrology and Urology, Notes from prior ED visits and [place] Controlled Substance Database    Pertinent labs & imaging results that were available during my care of the patient were reviewed by me and considered in my medical decision making (see chart for details).    ____________________________________________   FINAL CLINICAL IMPRESSION(S) / ED DIAGNOSES  Final diagnoses:  Acute kidney injury superimposed on chronic kidney disease ([place])  Complicated UTI (urinary tract infection)  Gross hematuria  Acute on chronic anemia      NEW MEDICATIONS STARTED DURING THIS VISIT:  ED Discharge Orders    None       Note:  This document was prepared using Dragon voice recognition software and may include unintentional dictation errors.    Rudene Re, MD 11/04/17 [id number]

## 2017-11-04 NOTE — Consult Note (Signed)
I have been asked to see the patient by Dr. Rudene Re, for evaluation and management of elevated creatinine and hydro.  History of present illness: Jordan [name] is a 67 year old white male recently diagnosed with a bladder mass and bilateral hydronephrosis.  He had bilateral PERC tubes placed and then was taken for TURBT and ureteral stenting on Sept 19th.  The patient subsequently was discharged home and his nephrostomy tubes were capped.  He does have a Foley catheter.  In the nursing home the patient was having labs checked and he was noted to have a creatinine of 7.89.  (Baseline 3.30) he was noted to have some mild or worsening confusion.  He was not having any fevers or chills.  He was not complaining of any significant pain.  A CT scan was obtained in the emergency department upon presentation and he was noted to have hydroureteronephrosis.   Review of systems: A 12 point comprehensive review of systems was obtained and is negative unless otherwise stated in the history of present illness.  Patient Active Problem List   Diagnosis Date Noted  . Acute UTI 11/04/2017  . Goals of care, counseling/discussion 11/03/2017  . Anemia 11/03/2017  . Chronic anticoagulation 11/03/2017  . Thrombocytosis ([place]) 11/03/2017  . 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 Urinary tract infection 10/25/2017  . Acute kidney injury ([place]) 09/21/2017  . Hyperthyroidism 03/30/2014  . Type I diabetes mellitus ([place]) 03/30/2014  . Hyperglycemia 03/30/2014  . Thyrotoxicosis 03/30/2014  . History of stroke 03/30/2014  . Malnutrition of moderate degree ([place]) 03/28/2014  . Atrial fibrillation with rapid ventricular response ([place]) 03/19/2014    No current facility-administered medications on file prior to encounter.    Current Outpatient Medications on File Prior to Encounter  Medication Sig Dispense Refill  . acetaminophen (TYLENOL) 325 MG tablet Take 2 tablets (650 mg total) by mouth every 6 (six) hours as  needed for mild pain (or Fever >/= 101).    Marland Kitchen amiodarone (PACERONE) 200 MG tablet Take 200 mg by mouth daily.  0  . cephALEXin (KEFLEX) 500 MG capsule Take 1 capsule (500 mg total) by mouth 3 (three) times daily for 7 days. 21 capsule 0  . digoxin 62.5 MCG TABS Take 0.0625 mg by mouth daily.    Marland Kitchen docusate sodium (COLACE) 100 MG capsule Take 1 capsule (100 mg total) by mouth 2 (two) times daily. 60 capsule 0  . famotidine (PEPCID) 20 MG tablet Take 1 tablet (20 mg total) by mouth daily.    . folic acid (FOLVITE) 1 MG tablet Take 1 mg by mouth daily.    Marland Kitchen HYDROcodone-acetaminophen (NORCO/VICODIN) 5-325 MG tablet Take 1-2 tablets by mouth every 6 (six) hours as needed for moderate pain. 20 tablet 0  . insulin aspart (NOVOLOG) 100 UNIT/ML injection Inject 10 Units into the skin 3 (three) times daily with meals. (Patient taking differently: Inject 13 Units into the skin 3 (three) times daily with meals. ) 10 mL 11  . insulin aspart (NOVOLOG) 100 UNIT/ML injection Inject 0-15 Units into the skin 4 (four) times daily -  before meals and at bedtime. CBG < 70: implement hypoglycemia protocol CBG 70 - 120: 0 units CBG 121 - 150: 2 units CBG 151 - 200: 3 units CBG 201 - 250: 5 units CBG 251 - 300: 8 units CBG 301 - 350: 11 units CBG 351 - 400: 15 units CBG > 400: call MD and obtain STAT lab verification 10 mL 11  . insulin glargine (  LANTUS) 100 UNIT/ML injection Inject 0.37 mLs (37 Units total) into the skin daily. 10 mL 11  . loratadine (CLARITIN) 10 MG tablet Take 1 tablet (10 mg total) by mouth daily.    . metoprolol tartrate (LOPRESSOR) 25 MG tablet Take 1 tablet (25 mg total) by mouth 2 (two) times daily.    . Multiple Vitamin (MULTIVITAMIN WITH MINERALS) TABS tablet Take 1 tablet by mouth daily.    . ondansetron (ZOFRAN) 8 MG tablet Take 1 tablet (8 mg total) by mouth 2 (two) times daily as needed (Nausea or vomiting). 30 tablet 1  . oxybutynin (DITROPAN) 5 MG tablet Take 1 tablet (5 mg total) by  mouth every 8 (eight) hours as needed for bladder spasms. 30 tablet 0  . prochlorperazine (COMPAZINE) 10 MG tablet Take 1 tablet (10 mg total) by mouth every 6 (six) hours as needed (Nausea or vomiting). 30 tablet 1  . protein supplement shake (PREMIER PROTEIN) LIQD Take 325 mLs (11 oz total) by mouth 2 (two) times daily between meals.  0  . rosuvastatin (CRESTOR) 5 MG tablet Take 5 mg by mouth daily.    . tamsulosin (FLOMAX) 0.4 MG CAPS capsule Take 1 capsule (0.4 mg total) by mouth daily after supper. 30 capsule   . vitamin C (VITAMIN C) 250 MG tablet Take 1 tablet (250 mg total) by mouth 2 (two) times daily.    Marland Kitchen amLODipine (NORVASC) 10 MG tablet Take 10 mg by mouth daily.    Marland Kitchen atorvastatin (LIPITOR) 40 MG tablet Take 40 mg by mouth daily.    Marland Kitchen lidocaine-prilocaine (EMLA) cream Apply to affected area once (Patient not taking: Reported on 11/04/2017) 30 g 3  . polyethylene glycol (MIRALAX / GLYCOLAX) packet Take 17 g by mouth daily.    . Rivaroxaban (XARELTO) 15 MG TABS tablet Take 15 mg by mouth daily.    Marland Kitchen senna-docusate (SENOKOT-S) 8.6-50 MG tablet Take 1 tablet by mouth daily.    . Simethicone (MYLANTA GAS PO) Take by mouth.    . traZODone (DESYREL) 50 MG tablet Take 50 mg by mouth at bedtime.      Past Medical History:  Diagnosis Date  . Atrial fibrillation ([place])   . Cancer ([place])    a. 06/2011 b. s/p right hemicolectomy/ colon CA  . Colon polyps   . Dysrhythmia   . Hypertension   . Hyperthyroidism    a. 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 Nursing home resident   . Stroke ([place]) 03/2012   a. residual right sided weakness  . Type 1 diabetes ([place])    a. on insulin b. prior DKA     Past Surgical History:  Procedure Laterality Date  . CARDIOVERSION N/A 03/25/2014   Procedure: CARDIOVERSION;  Surgeon: Lelon Perla, MD;  Location[facility] ENDOSCOPY;  Service: Cardiovascular;  Laterality: N/A;  . COLONOSCOPY    . COLONOSCOPY WITH PROPOFOL N/A 07/24/2017    Procedure: COLONOSCOPY WITH PROPOFOL;  Surgeon: Toledo, Benay Pike, MD;  Location: [facility] ENDOSCOPY;  Service: Gastroenterology;  Laterality: N/A;  . CYSTOSCOPY W/ RETROGRADES Bilateral 10/23/2017   Procedure: CYSTOSCOPY WITH RETROGRADE PYELOGRAM;  Surgeon: Hollice Espy, MD;  Location: [facility] ORS;  Service: Urology;  Laterality: Bilateral;  . CYSTOSCOPY WITH RETROGRADE URETHROGRAM Bilateral 10/23/2017   Procedure: CYSTOSCOPY WITH antegrade nephrostogram;  Surgeon: Hollice Espy, MD;  Location: [facility] ORS;  Service: Urology;  Laterality: Bilateral;  . CYSTOSCOPY WITH STENT PLACEMENT Bilateral 10/23/2017   Procedure: CYSTOSCOPY  WITH STENT PLACEMENT;  Surgeon: Hollice Espy, MD;  Location: [facility] ORS;  Service: Urology;  Laterality: Bilateral;  . HEMICOLECTOMY Right   . IR NEPHROSTOMY PLACEMENT LEFT  09/22/2017  . IR NEPHROSTOMY PLACEMENT RIGHT  09/22/2017  . TRANSURETHRAL RESECTION OF BLADDER TUMOR N/A 10/23/2017   Procedure: TRANSURETHRAL RESECTION OF BLADDER TUMOR (TURBT);  Surgeon: Hollice Espy, MD;  Location: [facility] ORS;  Service: Urology;  Laterality: N/A;    Social History   Tobacco Use  . Smoking status: Former Smoker    Years: 10.00    Types: Cigarettes    Last attempt to quit: 09/21/1973    Years since quitting: 44.1  . Smokeless tobacco: Never Used  Substance Use Topics  . Alcohol use: No  . Drug use: No    Family History  Problem Relation Age of Onset  . Heart attack Mother   . Diabetes Mother   . Hypertension Mother   . Heart attack Father   . Colon cancer Brother   . Lung cancer Brother     PE: Vitals:   11/04/17 1927  BP: (!) 150/69  Pulse: 75  Resp: 16  Temp: 99.4 F (37.4 C)  TempSrc: Oral  SpO2: 100%   Patient appears to be in no acute distress  patient is alert and oriented x3 Atraumatic normocephalic head No cervical or supraclavicular lymphadenopathy appreciated No increased work of breathing, no audible wheezes/rhonchi Regular sinus  rhythm/rate Abdomen is soft, nontender, nondistended, no CVA or suprapubic tenderness Nephrostomy tubes were opened and draining clear yellow urine Foley catheter was draining blood-tinged urine.  PVR was 0 Lower extremities are symmetric without appreciable edema Grossly neurologically intact No identifiable skin lesions  Recent Labs    11/04/17 2007  WBC 12.4*  HGB 7.6*  HCT 22.1*   Recent Labs    11/04/17 2007  NA 127*  K 4.6  CL 98  CO2 17*  GLUCOSE 228*  BUN 70*  CREATININE 7.89*  CALCIUM 8.2*   Recent Labs    11/04/17 2007  INR 2.49   No results for input(s): LABURIN in the last 72 hours. Results for orders placed or performed during the hospital encounter of 10/25/17  Urine culture     Status: None   Collection Time: 10/25/17  7:15 PM  Result Value Ref Range Status   Specimen Description   Final    URINE, RANDOM Performed at [facility], [address], [place], [place] [address]    Special Requests   Final    NONE Performed at [facility], [address], [place], [place] [address]    Culture   Final    NO GROWTH Performed at [facility], [place] [address] [place], [place] [address]    Report Status 10/27/2017 FINAL  Final  Culture, blood (routine x 2)     Status: None   Collection Time: 10/25/17  8:12 PM  Result Value Ref Range Status   Specimen Description BLOOD RIGHT ANTECUBITAL  Final   Special Requests   Final    BOTTLES DRAWN AEROBIC AND ANAEROBIC Blood Culture results may not be optimal due to an excessive volume of blood received in culture bottles   Culture   Final    NO GROWTH 5 DAYS Performed at [facility], [address] [place],  [address]    Report Status 10/30/2017 FINAL  Final  Culture, blood (routine x 2)     Status: None   Collection Time: 10/25/17  8:12 PM  Result Value Ref Range Status   Specimen Description BLOOD BLOOD LEFT WRIST  Final   Special Requests   Final    BOTTLES  DRAWN AEROBIC AND ANAEROBIC Blood Culture results may not be optimal due to an inadequate volume of blood received in culture bottles   Culture   Final    NO GROWTH 5 DAYS Performed at [facility], [address] [place], [place] [address]    Report Status 10/30/2017 FINAL  Final  MRSA PCR Screening     Status: None   Collection Time: 10/26/17 12:57 AM  Result Value Ref Range Status   MRSA by PCR NEGATIVE NEGATIVE Final    Comment:        The GeneXpert MRSA Assay (FDA approved for NASAL specimens only), is one component of a comprehensive MRSA colonization surveillance program. It is not intended to diagnose MRSA infection nor to guide or monitor treatment for MRSA infections. Performed at [facility], [address] [place], [place] [address]     Imaging: I reviewed the patient's CT scan demonstrating bilateral hydroureteronephrosis with stents in the appropriate position as well as nephrostomy tubes.  Imp: The patient has had worsening renal function secondary to ureteral stent occlusion and subsequent obstruction leading to hydronephrosis and acute on chronic renal failure.  Recommendations: Recommended that the patient have his nephrostomy tubes uncapped and allowed to drain.  The patient should be well hydrated.  We will continue to trend his labs with his tubes uncapped.  His catheter is draining, there do not appear to be any clots and is not retention.  Thank you for involving me in this patient's care, we will continue to follow along.  Louis Meckel W

## 2017-11-04 NOTE — ED Triage Notes (Signed)
Pt is here from [facility].  Pt has MOST form.  Pt has some confusion per ems and this is his baseline.  Pt had lab work done and his creatnine was elevated and his hgb has dropped.  Pt has hx of gu cancer.  Pt has foley cath in place and the urine appears red.  Pt is alert and oriented to person place and situation. He cant tell me the year

## 2017-11-04 NOTE — Telephone Encounter (Signed)
Dr. Tasia Catchings is referring Mr. Hietala for genetic counseling due to a personal history of colorectal cancer and bladder cancer and family history of colorectal cancer.  I spoke with his sister, Edwena Blow, to schedule this telegenetics visit to be done by phone at his convenience and she scheduled it for tomorrow 11/05/17 at 1pm when she will be with him to help him communicate information to me.   Steele Berg, [place], [place] Genetic Counselor Phone: [phone]

## 2017-11-04 NOTE — Patient Instructions (Signed)

## 2017-11-04 NOTE — ED Notes (Signed)
Patient transported to CT 

## 2017-11-05 ENCOUNTER — Telehealth: Payer: Self-pay | Admitting: Genetic Counselor

## 2017-11-05 ENCOUNTER — Encounter: Payer: Self-pay | Admitting: Genetic Counselor

## 2017-11-05 ENCOUNTER — Inpatient Hospital Stay: Payer: Medicare Other | Attending: Oncology

## 2017-11-05 DIAGNOSIS — I129 Hypertensive chronic kidney disease with stage 1 through stage 4 chronic kidney disease, or unspecified chronic kidney disease: Secondary | ICD-10-CM | POA: Insufficient documentation

## 2017-11-05 DIAGNOSIS — C679 Malignant neoplasm of bladder, unspecified: Secondary | ICD-10-CM | POA: Insufficient documentation

## 2017-11-05 DIAGNOSIS — Z5111 Encounter for antineoplastic chemotherapy: Secondary | ICD-10-CM | POA: Insufficient documentation

## 2017-11-05 DIAGNOSIS — R197 Diarrhea, unspecified: Secondary | ICD-10-CM | POA: Insufficient documentation

## 2017-11-05 DIAGNOSIS — E109 Type 1 diabetes mellitus without complications: Secondary | ICD-10-CM | POA: Insufficient documentation

## 2017-11-05 DIAGNOSIS — C689 Malignant neoplasm of urinary organ, unspecified: Secondary | ICD-10-CM

## 2017-11-05 DIAGNOSIS — N179 Acute kidney failure, unspecified: Secondary | ICD-10-CM | POA: Insufficient documentation

## 2017-11-05 DIAGNOSIS — Z7901 Long term (current) use of anticoagulants: Secondary | ICD-10-CM | POA: Insufficient documentation

## 2017-11-05 DIAGNOSIS — Z8673 Personal history of transient ischemic attack (TIA), and cerebral infarction without residual deficits: Secondary | ICD-10-CM | POA: Insufficient documentation

## 2017-11-05 DIAGNOSIS — I480 Paroxysmal atrial fibrillation: Secondary | ICD-10-CM | POA: Insufficient documentation

## 2017-11-05 DIAGNOSIS — N131 Hydronephrosis with ureteral stricture, not elsewhere classified: Secondary | ICD-10-CM | POA: Insufficient documentation

## 2017-11-05 DIAGNOSIS — J90 Pleural effusion, not elsewhere classified: Secondary | ICD-10-CM | POA: Insufficient documentation

## 2017-11-05 DIAGNOSIS — Z992 Dependence on renal dialysis: Secondary | ICD-10-CM | POA: Insufficient documentation

## 2017-11-05 DIAGNOSIS — Z936 Other artificial openings of urinary tract status: Secondary | ICD-10-CM | POA: Insufficient documentation

## 2017-11-05 DIAGNOSIS — Z87891 Personal history of nicotine dependence: Secondary | ICD-10-CM | POA: Insufficient documentation

## 2017-11-05 DIAGNOSIS — I2510 Atherosclerotic heart disease of native coronary artery without angina pectoris: Secondary | ICD-10-CM | POA: Insufficient documentation

## 2017-11-05 DIAGNOSIS — Z79899 Other long term (current) drug therapy: Secondary | ICD-10-CM | POA: Insufficient documentation

## 2017-11-05 DIAGNOSIS — D649 Anemia, unspecified: Secondary | ICD-10-CM | POA: Insufficient documentation

## 2017-11-05 DIAGNOSIS — Z809 Family history of malignant neoplasm, unspecified: Secondary | ICD-10-CM | POA: Insufficient documentation

## 2017-11-05 LAB — CBC
HCT: 20.6 % — ABNORMAL LOW (ref 40.0–52.0)
Hemoglobin: 7.2 g/dL — ABNORMAL LOW (ref 13.0–18.0)
MCH: 30.4 pg (ref 26.0–34.0)
MCHC: 35 g/dL (ref 32.0–36.0)
MCV: 86.9 fL (ref 80.0–100.0)
PLATELETS: 573 10*3/uL — AB (ref 150–440)
RBC: 2.37 MIL/uL — AB (ref 4.40–5.90)
RDW: 14.3 % (ref 11.5–14.5)
WBC: 10.6 10*3/uL (ref 3.8–10.6)

## 2017-11-05 LAB — GLUCOSE, CAPILLARY
GLUCOSE-CAPILLARY: 273 mg/dL — AB (ref 70–99)
GLUCOSE-CAPILLARY: 363 mg/dL — AB (ref 70–99)
GLUCOSE-CAPILLARY: 399 mg/dL — AB (ref 70–99)
Glucose-Capillary: 246 mg/dL — ABNORMAL HIGH (ref 70–99)
Glucose-Capillary: 254 mg/dL — ABNORMAL HIGH (ref 70–99)

## 2017-11-05 LAB — BASIC METABOLIC PANEL
ANION GAP: 9 (ref 5–15)
BUN: 59 mg/dL — ABNORMAL HIGH (ref 8–23)
CALCIUM: 8.1 mg/dL — AB (ref 8.9–10.3)
CHLORIDE: 107 mmol/L (ref 98–111)
CO2: 17 mmol/L — ABNORMAL LOW (ref 22–32)
CREATININE: 6.86 mg/dL — AB (ref 0.61–1.24)
GFR calc Af Amer: 9 mL/min — ABNORMAL LOW (ref >60)
GFR calc non Af Amer: 7 mL/min — ABNORMAL LOW (ref >60)
Glucose, Bld: 232 mg/dL — ABNORMAL HIGH (ref 70–99)
POTASSIUM: 4.7 mmol/L (ref 3.5–5.1)
Sodium: 133 mmol/L — ABNORMAL LOW (ref 135–145)

## 2017-11-05 LAB — PREPARE RBC (CROSSMATCH)

## 2017-11-05 MED ORDER — AMIODARONE HCL 200 MG PO TABS
200.0000 mg | ORAL_TABLET | Freq: Every day | ORAL | Status: DC
  Administered 2017-11-05 – 2017-11-11 (×6): 200 mg via ORAL
  Filled 2017-11-05 (×6): qty 1

## 2017-11-05 MED ORDER — INSULIN ASPART 100 UNIT/ML ~~LOC~~ SOLN: 0.0000 [IU] | Freq: Three times a day (TID) | SUBCUTANEOUS | Status: DC

## 2017-11-05 MED ORDER — DIGOXIN 125 MCG PO TABS
0.0625 mg | ORAL_TABLET | Freq: Every day | ORAL | Status: DC
  Administered 2017-11-05 – 2017-11-11 (×6): 0.0625 mg via ORAL
  Filled 2017-11-05 (×7): qty 0.5

## 2017-11-05 MED ORDER — FAMOTIDINE 20 MG PO TABS
20.0000 mg | ORAL_TABLET | Freq: Every day | ORAL | Status: DC
  Administered 2017-11-05 – 2017-11-11 (×6): 20 mg via ORAL
  Filled 2017-11-05 (×6): qty 1

## 2017-11-05 MED ORDER — TAMSULOSIN HCL 0.4 MG PO CAPS
0.4000 mg | ORAL_CAPSULE | Freq: Every day | ORAL | Status: DC
  Administered 2017-11-05 – 2017-11-10 (×6): 0.4 mg via ORAL
  Filled 2017-11-05 (×6): qty 1

## 2017-11-05 MED ORDER — ACETAMINOPHEN 650 MG RE SUPP: 650.0000 mg | Freq: Four times a day (QID) | RECTAL | Status: DC | PRN

## 2017-11-05 MED ORDER — INSULIN ASPART 100 UNIT/ML ~~LOC~~ SOLN
0.0000 [IU] | Freq: Every day | SUBCUTANEOUS | Status: DC
  Administered 2017-11-05: 22:00:00 5 [IU] via SUBCUTANEOUS
  Administered 2017-11-07: 2 [IU] via SUBCUTANEOUS
  Administered 2017-11-08 – 2017-11-10 (×3): 3 [IU] via SUBCUTANEOUS
  Filled 2017-11-05 (×5): qty 1

## 2017-11-05 MED ORDER — BISACODYL 5 MG PO TBEC: 5.0000 mg | DELAYED_RELEASE_TABLET | Freq: Every day | ORAL | Status: DC | PRN

## 2017-11-05 MED ORDER — FOLIC ACID 1 MG PO TABS
1.0000 mg | ORAL_TABLET | Freq: Every day | ORAL | Status: DC
  Administered 2017-11-05 – 2017-11-11 (×6): 1 mg via ORAL
  Filled 2017-11-05 (×6): qty 1

## 2017-11-05 MED ORDER — SODIUM CHLORIDE 0.9 % IV SOLN
1.0000 g | INTRAVENOUS | Status: DC
  Administered 2017-11-05 – 2017-11-07 (×3): 1 g via INTRAVENOUS
  Filled 2017-11-05 (×3): qty 1

## 2017-11-05 MED ORDER — ADULT MULTIVITAMIN W/MINERALS CH
1.0000 | ORAL_TABLET | Freq: Every day | ORAL | Status: DC
  Administered 2017-11-05 – 2017-11-11 (×6): 1 via ORAL
  Filled 2017-11-05 (×6): qty 1

## 2017-11-05 MED ORDER — ONDANSETRON HCL 4 MG PO TABS: 4.0000 mg | ORAL_TABLET | Freq: Four times a day (QID) | ORAL | Status: DC | PRN

## 2017-11-05 MED ORDER — VITAMIN C 500 MG PO TABS
250.0000 mg | ORAL_TABLET | Freq: Two times a day (BID) | ORAL | Status: DC
  Administered 2017-11-05 – 2017-11-11 (×13): 250 mg via ORAL
  Filled 2017-11-05 (×13): qty 1

## 2017-11-05 MED ORDER — SODIUM CHLORIDE 0.9 % IV SOLN
INTRAVENOUS | Status: DC
  Administered 2017-11-05 – 2017-11-09 (×11): via INTRAVENOUS

## 2017-11-05 MED ORDER — METOPROLOL TARTRATE 25 MG PO TABS
25.0000 mg | ORAL_TABLET | Freq: Two times a day (BID) | ORAL | Status: DC
  Administered 2017-11-05 – 2017-11-11 (×13): 25 mg via ORAL
  Filled 2017-11-05 (×13): qty 1

## 2017-11-05 MED ORDER — AMLODIPINE BESYLATE 10 MG PO TABS
10.0000 mg | ORAL_TABLET | Freq: Every day | ORAL | Status: DC
  Administered 2017-11-05 – 2017-11-11 (×6): 10 mg via ORAL
  Filled 2017-11-05 (×2): qty 1
  Filled 2017-11-05: qty 2
  Filled 2017-11-05: qty 1
  Filled 2017-11-05: qty 2
  Filled 2017-11-05: qty 1

## 2017-11-05 MED ORDER — TRAZODONE HCL 50 MG PO TABS: 25.0000 mg | ORAL_TABLET | Freq: Every evening | ORAL | Status: DC | PRN

## 2017-11-05 MED ORDER — HYDROCODONE-ACETAMINOPHEN 5-325 MG PO TABS: 1.0000 | ORAL_TABLET | ORAL | Status: DC | PRN

## 2017-11-05 MED ORDER — LORATADINE 10 MG PO TABS
10.0000 mg | ORAL_TABLET | Freq: Every day | ORAL | Status: DC
  Administered 2017-11-05 – 2017-11-11 (×6): 10 mg via ORAL
  Filled 2017-11-05 (×6): qty 1

## 2017-11-05 MED ORDER — INSULIN ASPART 100 UNIT/ML ~~LOC~~ SOLN
0.0000 [IU] | Freq: Three times a day (TID) | SUBCUTANEOUS | Status: DC
  Administered 2017-11-05: 18:00:00 9 [IU] via SUBCUTANEOUS
  Administered 2017-11-05 (×2): 5 [IU] via SUBCUTANEOUS
  Administered 2017-11-06: 7 [IU] via SUBCUTANEOUS
  Filled 2017-11-05 (×4): qty 1

## 2017-11-05 MED ORDER — ROSUVASTATIN CALCIUM 10 MG PO TABS
5.0000 mg | ORAL_TABLET | Freq: Every day | ORAL | Status: DC
  Administered 2017-11-05 – 2017-11-11 (×6): 5 mg via ORAL
  Filled 2017-11-05 (×6): qty 1

## 2017-11-05 MED ORDER — PREMIER PROTEIN SHAKE
11.0000 [oz_av] | Freq: Two times a day (BID) | ORAL | Status: DC
  Administered 2017-11-05 – 2017-11-11 (×9): 11 [oz_av] via ORAL

## 2017-11-05 MED ORDER — ONDANSETRON HCL 4 MG/2ML IJ SOLN: 4.0000 mg | Freq: Four times a day (QID) | INTRAMUSCULAR | Status: DC | PRN

## 2017-11-05 MED ORDER — DOCUSATE SODIUM 100 MG PO CAPS
100.0000 mg | ORAL_CAPSULE | Freq: Two times a day (BID) | ORAL | Status: DC
  Administered 2017-11-05 – 2017-11-11 (×11): 100 mg via ORAL
  Filled 2017-11-05 (×13): qty 1

## 2017-11-05 MED ORDER — ACETAMINOPHEN 325 MG PO TABS: 650.0000 mg | ORAL_TABLET | Freq: Four times a day (QID) | ORAL | Status: DC | PRN

## 2017-11-05 MED ORDER — ATORVASTATIN CALCIUM 20 MG PO TABS: 40.0000 mg | ORAL_TABLET | Freq: Every day | ORAL | Status: DC

## 2017-11-05 MED ORDER — INSULIN GLARGINE 100 UNIT/ML ~~LOC~~ SOLN
12.0000 [IU] | Freq: Every day | SUBCUTANEOUS | Status: DC
  Administered 2017-11-05 – 2017-11-06 (×2): 12 [IU] via SUBCUTANEOUS
  Filled 2017-11-05 (×2): qty 0.12

## 2017-11-05 NOTE — Progress Notes (Signed)
[place] [facility] [place] NAME: Roderick Calo    MR#:  [id number]  DATE OF BIRTH:  [birth year]  SUBJECTIVE:  CHIEF COMPLAINT:   Chief Complaint  Patient presents with  . Abnormal Lab   -Sent in from [facility] for abnormal labs.  Both nephrostomy tubes are draining clear amber-colored urine -Hemoglobin low at 7  REVIEW OF SYSTEMS:  Review of Systems  Constitutional: Positive for malaise/fatigue. Negative for chills and fever.  HENT: Negative for congestion, ear discharge, hearing loss and nosebleeds.   Respiratory: Negative for cough, shortness of breath and wheezing.   Cardiovascular: Negative for chest pain, palpitations and leg swelling.  Gastrointestinal: Negative for abdominal pain, constipation, diarrhea, nausea and vomiting.  Genitourinary: Positive for dysuria.  Musculoskeletal: Negative for myalgias.  Neurological: Negative for dizziness, focal weakness, seizures, weakness and headaches.  Psychiatric/Behavioral: Negative for depression.    DRUG ALLERGIES:   Allergies  Allergen Reactions  . Amiodarone Other (See Comments)    Thyrotoxicosis per Murray Hodgkins, NP    VITALS:  Blood pressure (!) 149/74, pulse 71, temperature 98.3 F (36.8 C), temperature source Oral, resp. rate 18, height 5\' 9"  (1.753 m), weight 74.3 kg, SpO2 97 %.  PHYSICAL EXAMINATION:  Physical Exam   GENERAL:  67 y.o.-year-old patient lying in the bed with no acute distress.  EYES: Pupils equal, round, reactive to light and accommodation. No scleral icterus. Extraocular muscles intact.  HEENT: Head atraumatic, normocephalic. Oropharynx and nasopharynx clear.  NECK:  Supple, no jugular venous distention. No thyroid enlargement, no tenderness.  LUNGS: Normal breath sounds bilaterally, no wheezing, rales,rhonchi or crepitation. No use of accessory muscles of respiration. Decreased bibasilar breath sounds CARDIOVASCULAR: S1, S2 normal. No murmurs,  rubs, or gallops.  ABDOMEN: Soft, nontender, nondistended. Bowel sounds present. No organomegaly or mass.  - bilateral nephrostomy tubes in place EXTREMITIES: No pedal edema, cyanosis, or clubbing.  NEUROLOGIC: Cranial nerves II through XII are intact. Muscle strength 5/5 in all extremities. Sensation intact. Gait not checked. Global weakness noted. PSYCHIATRIC: The patient is alert and oriented to self  SKIN: No obvious rash, lesion, or ulcer.    LABORATORY PANEL:   CBC Recent Labs  Lab 11/05/17 0611  WBC 10.6  HGB 7.2*  HCT 20.6*  PLT 573*   ------------------------------------------------------------------------------------------------------------------  Chemistries  Recent Labs  Lab 11/04/17 2007 11/05/17 0611  NA 127* 133*  K 4.6 4.7  CL 98 107  CO2 17* 17*  GLUCOSE 228* 232*  BUN 70* 59*  CREATININE 7.89* 6.86*  CALCIUM 8.2* 8.1*  AST 14*  --   ALT 12  --   ALKPHOS 99  --   BILITOT 0.5  --    ------------------------------------------------------------------------------------------------------------------  Cardiac Enzymes No results for input(s): TROPONINI in the last 168 hours. ------------------------------------------------------------------------------------------------------------------  RADIOLOGY:  Ct Renal Stone Study  Result Date: 11/04/2017 CLINICAL DATA:  Pt has some confusion per ems and this is his baseline. Pt had lab work done and his creatinine was elevated and his hemoglobin has dropped. Pt has hx of GU cancer. Pt has Foley cath in place and the urine appears red. Patient underwent trans urethral resection of bladder tumor on 10/23/2017. EXAM: CT ABDOMEN AND PELVIS WITHOUT CONTRAST TECHNIQUE: Multidetector CT imaging of the abdomen and pelvis was performed following the standard protocol without IV contrast. COMPARISON:  09/22/2017 FINDINGS: Lower chest: Small bilateral pleural effusions and bibasilar atelectasis. Heart size is normal. There is  coronary artery calcification. Hepatobiliary: A  5 millimeter low-attenuation lesion is identified in the RIGHT hepatic lobe, not further characterized. No radiopaque gallstones, biliary dilatation, or pericholecystic inflammatory changes. Pancreas: Unremarkable. No pancreatic ductal dilatation or surrounding inflammatory changes. Spleen: Normal in size without focal abnormality. Adrenals/Urinary Tract: Adrenal glands are normal in appearance. There is bilateral hydronephrosis. There are bilateral percutaneous nephrostomies. There are bilateral double-J ureteral stents traversing dilated ureters. A Foley catheter is in place. The degree of hydronephrosis is similar compared with previous exams. Stomach/Bowel: The stomach is normal in appearance. Small bowel loops are unremarkable. Status post ascending colectomy. Moderate stool within the transverse colon and sigmoid colon. There is thickening of the rectal wall, associated with stranding in the perirectal fat. Soft tissue density along the LEFT aspect of the rectum measures 2.1 x 5.1 centimeters and may represent inflammatory change or tumor. There is thickening along the distal portions of the ureters bilaterally, LEFT greater than RIGHT. Vascular/Lymphatic: There is atherosclerotic calcification of the abdominal aorta. No associated aneurysm. Enlarged periaortic lymph nodes are identified, measuring up to 11 millimeters at the level of the renal veins. Reproductive: Prostate is unremarkable. Other: No abdominal wall hernia or abnormality. No abdominopelvic ascites. Musculoskeletal: Degenerative changes are identified primarily at L5-S1. Degenerative changes are seen in both hips. IMPRESSION: 1. Bilateral percutaneous nephrostomies and bilateral ureteral stents appear in good position. 2. Degree of hydronephrosis appears similar to the previous exam, prior to stent placement. 3. Foley catheter decompresses the bladder. 4. Thickening of the rectum, consistent with  infectious, inflammatory change, or tumor. Soft tissue along the LEFT aspect of the rectum likely represents tumor. 5. Coronary artery calcification. 6.  Aortic atherosclerosis.  (ICD10-I70.0) 7. Status post ascending colectomy. 8. Small bilateral effusions. Electronically Signed   By: Nolon Nations M.D.   On: 11/04/2017 20:11    EKG:   Orders placed or performed during the hospital encounter of 10/25/17  . ED EKG within 10 minutes  . ED EKG within 10 minutes  . EKG    ASSESSMENT AND PLAN:   67 year old male with past medical history significant for A. fib on Xarelto, history of colon cancer status post right colectomy, locally advanced invasive bladder cancer, hypertension and diabetes mellitus presents to hospital secondary to abnormal labs noted at [facility].  1.  Acute renal failure-on CKD stage III, baseline creatinine around 2.2. -Secondary to obstructive uropathy -Patient has highly invasive bladder cancer, had hydronephrosis with nephrostomy tubes placed percutaneously and also status post ureteral stenting. -His nephrostomy tubes were capped. -His creatinine was increasing up to 7 and so sent to the hospital. -Nephrostomy tubes have been uncapped and draining clear amber-colored urine -Nephrology consult.  No urgent indication for dialysis.  Continue monitoring urine output at this time. -Patient also has a Foley catheter which is not draining at this time.  Renal ultrasound showing worsening of his hydronephrosis likely from the nephrostomy tubes being capped. -Hyponatremia is also improving  2.  Metabolic acidosis-secondary to acute renal failure.  Continue to monitor at this time.  3.  Acute on chronic anemia-secondary to hematuria.  But hematuria has cleared up.  Hold Xarelto -1 unit packed RBC transfusion this admission.  4.  Acute cystitis-follow-up urine cultures.  On Rocephin  5.  Diabetes mellitus-appreciate diabetes coordinator's input Will start on Lantus  and also sliding scale insulin for now.  6.  DVT prophylaxis-teds and SCDs due to anemia  7 A. fib-rate controlled.  On low-dose digoxin, amiodarone.  And metoprolol.  Xarelto on hold due  to anemia   Physical therapy consult requested.  Patient is from [facility] short-term rehab Updated sister over the phone    All the records are reviewed and case discussed with Care Management/Social Workerr. Management plans discussed with the patient, family and they are in agreement.  CODE STATUS: Full Code  TOTAL TIME TAKING CARE OF THIS PATIENT: 38 minutes.   POSSIBLE D/C IN 2-3 DAYS, DEPENDING ON CLINICAL CONDITION.   Yatzil Clippinger M.D on 11/05/2017 at 2:56 PM  Between 7am to 6pm - Pager - [phone]  After 6pm go to [email or web address] - password [email or web address] [facility]  Office  [phone]  CC: Primary care physician; Albina Billet, MD

## 2017-11-05 NOTE — ED Notes (Signed)
On arrival pt had a foley cath in place which was draining red colored urine.  It was observed that he had a left and a right nephrostomy tube, both were capped with a red cap.  Caps were removed and urostomy was allowed to drain.  Right side was red, cool aid colored urine and left drained rose colored urine.  Both sides drained aproximately 900cc (each) from the time they were attached to a drain until I emptied them both now and attached the urostomy drainage bag

## 2017-11-05 NOTE — H&P (Signed)
[facility] NAME: Jordan [name]    MR#:  [id number]  DATE OF BIRTH:  [birth year]  DATE OF ADMISSION:  11/04/2017  PRIMARY CARE PHYSICIAN: Albina Billet, MD   REQUESTING/REFERRING PHYSICIAN:   CHIEF COMPLAINT:   Chief Complaint  Patient presents with  . Abnormal Lab    HISTORY OF PRESENT ILLNESS: Jordan [name]  is a 67 y.o. male with a known history of atrial fibrillation on Xarelto, colon cancer status post remote right colectomy and recent diagnosis of bladder cancer, status post bilateral nephrostomy tubes, bilateral ureteral stenting and Foley catheter. Patient was brought to the hospital for severe generalized weakness and confusion, abnormal labs and hematuria. Per nursing home records, he had blood test done earlier today and creatinine was 6.99 with a BUN and 50s.  No reports of fever or chills, no vomiting, no diarrhea. Blood test done in our emergency room showed WBC at 12.4, creatinine is 7.89, hemoglobin level at 7.6. Renal CT scan shows bilateral hydronephrosis.  Nephrostomy tube seemed to be in good position. Patient is admitted for further evaluation and treatment.  PAST MEDICAL HISTORY:   Past Medical History:  Diagnosis Date  . Atrial fibrillation ([place])   . Cancer ([place])    a. 06/2011 b. s/p right hemicolectomy/ colon CA  . Colon polyps   . Dysrhythmia   . Hypertension   . Hyperthyroidism    a. 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 Nursing home resident   . Stroke ([place]) 03/2012   a. residual right sided weakness  . Type 1 diabetes ([place])    a. on insulin b. prior DKA     PAST SURGICAL HISTORY:  Past Surgical History:  Procedure Laterality Date  . CARDIOVERSION N/A 03/25/2014   Procedure: CARDIOVERSION;  Surgeon: Lelon Perla, MD;  Location: [facility] ENDOSCOPY;  Service: Cardiovascular;  Laterality: N/A;  . COLONOSCOPY    . COLONOSCOPY WITH PROPOFOL N/A 07/24/2017   Procedure:  COLONOSCOPY WITH PROPOFOL;  Surgeon: Toledo, Benay Pike, MD;  Location: [facility] ENDOSCOPY;  Service: Gastroenterology;  Laterality: N/A;  . CYSTOSCOPY W/ RETROGRADES Bilateral 10/23/2017   Procedure: CYSTOSCOPY WITH RETROGRADE PYELOGRAM;  Surgeon: Hollice Espy, MD;  Location: [facility] ORS;  Service: Urology;  Laterality: Bilateral;  . CYSTOSCOPY WITH RETROGRADE URETHROGRAM Bilateral 10/23/2017   Procedure: CYSTOSCOPY WITH antegrade nephrostogram;  Surgeon: Hollice Espy, MD;  Location: [facility] ORS;  Service: Urology;  Laterality: Bilateral;  . CYSTOSCOPY WITH STENT PLACEMENT Bilateral 10/23/2017   Procedure: CYSTOSCOPY WITH STENT PLACEMENT;  Surgeon: Hollice Espy, MD;  Location: [facility] ORS;  Service: Urology;  Laterality: Bilateral;  . HEMICOLECTOMY Right   . IR NEPHROSTOMY PLACEMENT LEFT  09/22/2017  . IR NEPHROSTOMY PLACEMENT RIGHT  09/22/2017  . TRANSURETHRAL RESECTION OF BLADDER TUMOR N/A 10/23/2017   Procedure: TRANSURETHRAL RESECTION OF BLADDER TUMOR (TURBT);  Surgeon: Hollice Espy, MD;  Location: [facility] ORS;  Service: Urology;  Laterality: N/A;    SOCIAL HISTORY:  Social History   Tobacco Use  . Smoking status: Former Smoker    Years: 10.00    Types: Cigarettes    Last attempt to quit: 09/21/1973    Years since quitting: 44.1  . Smokeless tobacco: Never Used  Substance Use Topics  . Alcohol use: No    FAMILY HISTORY:  Family History  Problem Relation Age of Onset  . Heart attack Mother   . Diabetes Mother   . Hypertension Mother   .  Heart attack Father   . Colon cancer Brother   . Lung cancer Brother     DRUG ALLERGIES:  Allergies  Allergen Reactions  . Amiodarone Other (See Comments)    Thyrotoxicosis per Murray Hodgkins, NP    REVIEW OF SYSTEMS:   CONSTITUTIONAL: No fever, but positive for severe fatigue and generalized weakness.  EYES: No changes in vision.  EARS, NOSE, AND THROAT: No tinnitus or ear pain.  RESPIRATORY: No cough, shortness of breath, wheezing or  hemoptysis.  CARDIOVASCULAR: No chest pain, orthopnea, edema.  GASTROINTESTINAL: No vomiting, diarrhea or abdominal pain.  GENITOURINARY: Positive for hematuria.  ENDOCRINE: No polyuria, nocturia. HEMATOLOGY: Positive for bleeding in the urine. SKIN: No rash or lesion. MUSCULOSKELETAL: No joint pain at this time.   NEUROLOGIC: No focal weakness.  PSYCHIATRY: No anxiety or depression.   MEDICATIONS AT HOME:  Prior to Admission medications   Medication Sig Start Date End Date Taking? Authorizing Provider  acetaminophen (TYLENOL) 325 MG tablet Take 2 tablets (650 mg total) by mouth every 6 (six) hours as needed for mild pain (or Fever >/= 101). 10/01/17  Yes Gouru, Illene Silver, MD  amiodarone (PACERONE) 200 MG tablet Take 200 mg by mouth daily. 10/04/17  Yes [provider]  digoxin 62.5 MCG TABS Take 0.0625 mg by mouth daily. 10/01/17  Yes Gouru, Illene Silver, MD  docusate sodium (COLACE) 100 MG capsule Take 1 capsule (100 mg total) by mouth 2 (two) times daily. 10/23/17  Yes Hollice Espy, MD  famotidine (PEPCID) 20 MG tablet Take 1 tablet (20 mg total) by mouth daily. 10/02/17  Yes Gouru, Illene Silver, MD  folic acid (FOLVITE) 1 MG tablet Take 1 mg by mouth daily.   Yes [provider]  HYDROcodone-acetaminophen (NORCO/VICODIN) 5-325 MG tablet Take 1-2 tablets by mouth every 6 (six) hours as needed for moderate pain. 10/28/17  Yes Pyreddy, Reatha Harps, MD  insulin aspart (NOVOLOG) 100 UNIT/ML injection Inject 10 Units into the skin 3 (three) times daily with meals. Patient taking differently: Inject 13 Units into the skin 3 (three) times daily with meals.  10/01/17  Yes Gouru, Aruna, MD  insulin aspart (NOVOLOG) 100 UNIT/ML injection Inject 0-15 Units into the skin 4 (four) times daily -  before meals and at bedtime. CBG < 70: implement hypoglycemia protocol CBG 70 - 120: 0 units CBG 121 - 150: 2 units CBG 151 - 200: 3 units CBG 201 - 250: 5 units CBG 251 - 300: 8 units CBG 301 - 350: 11 units CBG  351 - 400: 15 units CBG > 400: call MD and obtain STAT lab verification 10/01/17  Yes Gouru, Aruna, MD  insulin glargine (LANTUS) 100 UNIT/ML injection Inject 0.37 mLs (37 Units total) into the skin daily. 10/02/17  Yes Gouru, Illene Silver, MD  loratadine (CLARITIN) 10 MG tablet Take 1 tablet (10 mg total) by mouth daily. 10/02/17  Yes Gouru, Illene Silver, MD  metoprolol tartrate (LOPRESSOR) 25 MG tablet Take 1 tablet (25 mg total) by mouth 2 (two) times daily. 10/01/17  Yes Gouru, Illene Silver, MD  Multiple Vitamin (MULTIVITAMIN WITH MINERALS) TABS tablet Take 1 tablet by mouth daily. 10/02/17  Yes Gouru, Aruna, MD  ondansetron (ZOFRAN) 8 MG tablet Take 1 tablet (8 mg total) by mouth 2 (two) times daily as needed (Nausea or vomiting). 11/03/17  Yes Earlie Server, MD  oxybutynin (DITROPAN) 5 MG tablet Take 1 tablet (5 mg total) by mouth every 8 (eight) hours as needed for bladder spasms. 10/23/17  Yes Hollice Espy, MD  prochlorperazine (COMPAZINE) 10 MG tablet Take 1 tablet (10 mg total) by mouth every 6 (six) hours as needed (Nausea or vomiting). 11/03/17  Yes Earlie Server, MD  protein supplement shake (PREMIER PROTEIN) LIQD Take 325 mLs (11 oz total) by mouth 2 (two) times daily between meals. 10/01/17  Yes Gouru, Illene Silver, MD  rosuvastatin (CRESTOR) 5 MG tablet Take 5 mg by mouth daily.   Yes [provider]  tamsulosin (FLOMAX) 0.4 MG CAPS capsule Take 1 capsule (0.4 mg total) by mouth daily after supper. 10/01/17  Yes Gouru, Illene Silver, MD  vitamin C (VITAMIN C) 250 MG tablet Take 1 tablet (250 mg total) by mouth 2 (two) times daily. 10/01/17  Yes Gouru, Illene Silver, MD  amLODipine (NORVASC) 10 MG tablet Take 10 mg by mouth daily.    [provider]  atorvastatin (LIPITOR) 40 MG tablet Take 40 mg by mouth daily.    [provider]  lidocaine-prilocaine (EMLA) cream Apply to affected area once Patient not taking: Reported on 11/04/2017 11/03/17   Earlie Server, MD  polyethylene glycol [facility] / Floria Raveling) packet Take 17 g by  mouth daily.    [provider]  Rivaroxaban (XARELTO) 15 MG TABS tablet Take 15 mg by mouth daily.    [provider]  senna-docusate (SENOKOT-S) 8.6-50 MG tablet Take 1 tablet by mouth daily.    [provider]  Simethicone (MYLANTA GAS PO) Take by mouth.    [provider]  traZODone (DESYREL) 50 MG tablet Take 50 mg by mouth at bedtime.    [provider]      PHYSICAL EXAMINATION:   VITAL SIGNS: Blood pressure (!) 148/73, pulse 72, temperature 98.3 F (36.8 C), temperature source Oral, resp. rate 18, height 5\' 9"  (1.753 m), weight 74.3 kg, SpO2 100 %.  GENERAL:  67 y.o.-year-old patient lying in the bed with no acute distress.  He looks chronically ill, exhausted EYES: Pupils equal, round, reactive to light and accommodation. No scleral icterus. Extraocular muscles intact.  HEENT: Head atraumatic, normocephalic. Oropharynx and nasopharynx clear.  NECK:  Supple, no jugular venous distention. No thyroid enlargement, no tenderness.  LUNGS: Normal breath sounds bilaterally, no wheezing, rales,rhonchi or crepitation. No use of accessory muscles of respiration.  CARDIOVASCULAR: S1, S2 normal. No S3/S4.  ABDOMEN: Soft, nontender, nondistended. Bowel sounds present. No organomegaly or mass.  EXTREMITIES: No pedal edema, cyanosis, or clubbing.  NEUROLOGIC: No focal weakness. GENITOURINARY: No CVA tenderness.  Bilateral nephrostomy tubes are now connected to the urinary bag and draining gross hematuria. PSYCHIATRIC: The patient is alert, but still somewhat confused, although he feels better now status post IV fluid.  SKIN: No obvious rash, lesion, or ulcer.   LABORATORY PANEL:   CBC Recent Labs  Lab 10/31/17 1630 11/04/17 2007  WBC 10.2 12.4*  HGB 8.3* 7.6*  HCT 24.4* 22.1*  PLT 662* 606*  MCV 88.3 85.5  MCH 30.0 29.3  MCHC 33.9 34.3  RDW 14.4 14.3  LYMPHSABS 1.2 0.9*  MONOABS 0.9 1.1*  EOSABS 0.2 0.1  BASOSABS 0.1 0.1    ------------------------------------------------------------------------------------------------------------------  Chemistries  Recent Labs  Lab 10/31/17 1630 11/04/17 2007  NA 129* 127*  K 4.5 4.6  CL 102 98  CO2 18* 17*  GLUCOSE 337* 228*  BUN 41* 70*  CREATININE 3.30* 7.89*  CALCIUM 8.6* 8.2*  AST 18 14*  ALT 15 12  ALKPHOS 103 99  BILITOT 0.4 0.5   ------------------------------------------------------------------------------------------------------------------ estimated creatinine clearance is 9.1 mL/min (A) (by C-G formula  based on SCr of 7.89 mg/dL (H)). ------------------------------------------------------------------------------------------------------------------ No results for input(s): TSH, T4TOTAL, T3FREE, THYROIDAB in the last 72 hours.  Invalid input(s): FREET3   Coagulation profile Recent Labs  Lab 11/04/17 2007  INR 2.49   ------------------------------------------------------------------------------------------------------------------- No results for input(s): DDIMER in the last 72 hours. -------------------------------------------------------------------------------------------------------------------  Cardiac Enzymes No results for input(s): CKMB, TROPONINI, MYOGLOBIN in the last 168 hours.  Invalid input(s): CK ------------------------------------------------------------------------------------------------------------------ Invalid input(s): POCBNP  ---------------------------------------------------------------------------------------------------------------  Urinalysis    Component Value Date/Time   COLORURINE AMBER (A) 11/04/2017 1936   APPEARANCEUR CLOUDY (A) 11/04/2017 1936   APPEARANCEUR Cloudy (A) 10/09/2017 0937   LABSPEC 1.014 11/04/2017 1936   LABSPEC 1.020 06/10/2012 1106   PHURINE  11/04/2017 1936    TEST NOT REPORTED DUE TO COLOR INTERFERENCE OF URINE PIGMENT   GLUCOSEU (A) 11/04/2017 1936    TEST NOT REPORTED DUE TO  COLOR INTERFERENCE OF URINE PIGMENT   GLUCOSEU >=500 06/10/2012 1106   HGBUR (A) 11/04/2017 1936    TEST NOT REPORTED DUE TO COLOR INTERFERENCE OF URINE PIGMENT   BILIRUBINUR (A) 11/04/2017 1936    TEST NOT REPORTED DUE TO COLOR INTERFERENCE OF URINE PIGMENT   BILIRUBINUR Negative 10/09/2017 0937   BILIRUBINUR Negative 06/10/2012 1106   KETONESUR (A) 11/04/2017 1936    TEST NOT REPORTED DUE TO COLOR INTERFERENCE OF URINE PIGMENT   PROTEINUR (A) 11/04/2017 1936    TEST NOT REPORTED DUE TO COLOR INTERFERENCE OF URINE PIGMENT   NITRITE (A) 11/04/2017 1936    TEST NOT REPORTED DUE TO COLOR INTERFERENCE OF URINE PIGMENT   LEUKOCYTESUR (A) 11/04/2017 1936    TEST NOT REPORTED DUE TO COLOR INTERFERENCE OF URINE PIGMENT   LEUKOCYTESUR 3+ (A) 10/09/2017 0937   LEUKOCYTESUR Negative 06/10/2012 1106     RADIOLOGY: Ct Renal Stone Study  Result Date: 11/04/2017 CLINICAL DATA:  Pt has some confusion per ems and this is his baseline. Pt had lab work done and his creatinine was elevated and his hemoglobin has dropped. Pt has hx of GU cancer. Pt has Foley cath in place and the urine appears red. Patient underwent trans urethral resection of bladder tumor on 10/23/2017. EXAM: CT ABDOMEN AND PELVIS WITHOUT CONTRAST TECHNIQUE: Multidetector CT imaging of the abdomen and pelvis was performed following the standard protocol without IV contrast. COMPARISON:  09/22/2017 FINDINGS: Lower chest: Small bilateral pleural effusions and bibasilar atelectasis. Heart size is normal. There is coronary artery calcification. Hepatobiliary: A 5 millimeter low-attenuation lesion is identified in the RIGHT hepatic lobe, not further characterized. No radiopaque gallstones, biliary dilatation, or pericholecystic inflammatory changes. Pancreas: Unremarkable. No pancreatic ductal dilatation or surrounding inflammatory changes. Spleen: Normal in size without focal abnormality. Adrenals/Urinary Tract: Adrenal glands are normal in  appearance. There is bilateral hydronephrosis. There are bilateral percutaneous nephrostomies. There are bilateral double-J ureteral stents traversing dilated ureters. A Foley catheter is in place. The degree of hydronephrosis is similar compared with previous exams. Stomach/Bowel: The stomach is normal in appearance. Small bowel loops are unremarkable. Status post ascending colectomy. Moderate stool within the transverse colon and sigmoid colon. There is thickening of the rectal wall, associated with stranding in the perirectal fat. Soft tissue density along the LEFT aspect of the rectum measures 2.1 x 5.1 centimeters and may represent inflammatory change or tumor. There is thickening along the distal portions of the ureters bilaterally, LEFT greater than RIGHT. Vascular/Lymphatic: There is atherosclerotic calcification of the abdominal aorta. No associated aneurysm. Enlarged periaortic lymph nodes are identified, measuring up to  11 millimeters at the level of the renal veins. Reproductive: Prostate is unremarkable. Other: No abdominal wall hernia or abnormality. No abdominopelvic ascites. Musculoskeletal: Degenerative changes are identified primarily at L5-S1. Degenerative changes are seen in both hips. IMPRESSION: 1. Bilateral percutaneous nephrostomies and bilateral ureteral stents appear in good position. 2. Degree of hydronephrosis appears similar to the previous exam, prior to stent placement. 3. Foley catheter decompresses the bladder. 4. Thickening of the rectum, consistent with infectious, inflammatory change, or tumor. Soft tissue along the LEFT aspect of the rectum likely represents tumor. 5. Coronary artery calcification. 6.  Aortic atherosclerosis.  (ICD10-I70.0) 7. Status post ascending colectomy. 8. Small bilateral effusions. Electronically Signed   By: Nolon Nations M.D.   On: 11/04/2017 20:11    EKG: Orders placed or performed during the hospital encounter of 10/25/17  . ED EKG within 10  minutes  . ED EKG within 10 minutes  . EKG    IMPRESSION AND PLAN:  1.  Bilateral hydronephrosis.  Per urology, connected nephrostomy tubes to the urinary bag.  Continue IV fluids and antibiotics.  Urology and nephrology are consulted for further evaluation and treatment. 2.  Acute renal failure, likely multifactorial, secondary to B/L hydronephrosis, UTI and dehydration/poor p.o. intake.  See treatment as above under #1. 3.  Complicated UTI, will start Zosyn and vancomycin IV, while waiting for urine culture result. 4.  Acute encephalopathy, likely secondary to uremia, improving with IV fluids and IV antibiotics.  Continue to monitor clinically closely. 5.  Acute on chronic anemia, multifactorial secondary to hematuria and malignancy.  Will hold any anticoagulants and transfuse packed RBC, as needed for hemoglobin lower than 7. 6. High-grade invasive urothelial carcinoma, status post bilateral nephrostomy tubes and ureteral stents.  Continue management per urology.  All the records are reviewed and case discussed with ED provider. Management plans discussed with the patient, who is in agreement.  CODE STATUS: Full    Code Status Orders  (From admission, onward)         Start     Ordered   11/05/17 0138  Full code  Continuous     11/05/17 0137        Code Status History    Date Active Date Inactive Code Status Order ID Comments User Context   10/26/2017 0043 10/28/2017 2128 Full Code [id number]  Sela Hua, MD Inpatient   09/21/2017 1548 10/01/2017 1912 Full Code [id number]  Loletha Grayer, MD ED   03/21/2014 0955 03/30/2014 1829 Full Code [id number]  Bensimhon, Shaune Pascal, MD Inpatient   03/20/2014 2024 03/21/2014 0955 Full Code [id number]  Luz Brazen, MD Inpatient   03/20/2014 2023 03/20/2014 2024 DNR [id number]  Luz Brazen, MD Inpatient   03/19/2014 2205 03/20/2014 2023 Full Code [id number]  Raliegh Ip, MD Inpatient    Advance Directive Documentation     Most Recent Value   Type of Advance Directive  Out of facility DNR (pink MOST or yellow form)  Pre-existing out of facility DNR order (yellow form or pink MOST form)  -  "MOST" Form in Place?  -       TOTAL TIME TAKING CARE OF THIS PATIENT: 60 minutes.    Amelia Jo M.D on 11/05/2017 at 5:00 AM  Between 7am to 6pm - Pager - [phone]  After 6pm go to [email or web address] - password [email or web address] [facility] [place] [facility]  [phone]  CC: Primary care physician; Albina Billet, MD

## 2017-11-05 NOTE — Progress Notes (Signed)
Inpatient Diabetes Program Recommendations  AACE/ADA: New Consensus Statement on Inpatient Glycemic Control (2015)  Target Ranges:  Prepandial:   less than 140 mg/dL      Peak postprandial:   less than 180 mg/dL (1-2 hours)      Critically ill patients:  140 - 180 mg/dL   Results for Jordan [name], Jordan [name] (MRN [id number]) as of 11/05/2017 09:06  Ref. Range 11/05/2017 08:05  Glucose-Capillary Latest Ref Range: 70 - 99 mg/dL 246 (H)   Results for Jordan [name], Jordan [name] (MRN [id number]) as of 11/05/2017 09:06  Ref. Range 09/21/2017 22:35  Hemoglobin A1C Latest Ref Range: 4.8 - 5.6 % 7.2 (H)    Admit with: Weakness/ AMS/ Hematuria/ Bilateral Hydronephrosis/ Acute Renal Failure/ UTI/ Anemia  History: DM, CVA  SNF DM Meds: Lantus 37 units Daily     Novolog 13 units TID with meals     Novolog 0-15 units QID per SSI  Current Orders: Novolog Sensitive Correction Scale/ SSI (0-9 units) TID AC + HS     MD- Please consider starting a portion of pt's home dose of Lantus:  Recommend Lantus 12 units Daily to start (1/3 total home dose)    --Will follow patient during hospitalization--  Wyn Quaker RN, MSN, CDE Diabetes Coordinator Inpatient Glycemic Control Team Team Pager: [phone] (8a-5p)

## 2017-11-05 NOTE — Telephone Encounter (Signed)
Cancer Genetics            Telegenetics Initial Visit    Patient Name: Jordan [name] Patient DOB: August 07, 1950 Patient Age: 67 y.o. Phone Call Date: 11/05/2017  Referring Provider: Earlie Server, MD  Reason for Visit: Evaluate for hereditary susceptibility to cancer    Assessment and Plan:  . 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 He has a very large family with no history of cancer.  . Testing is recommended to determine whether he has a pathogenic mutation that will impact his screening and risk-reduction for cancer. A negative result will be generally reassuring for his family.  . Jordan [name] wished to pursue genetic testing, and I will assist in coordinating his blood drawn. Analysis will include the 84 genes on [facility]'s Multi-Cancer panel (AIP, ALK, APC, ATM, AXIN2, BAP1, BARD1, BLM, BMPR1A, BRCA1, BRCA2, BRIP1, CASR, CDC73, CDH1, CDK4, CDKN1B, CDKN1C, CDKN2A, CEBPA, CHEK2, CTNNA1, DICER1, DIS3L2, EGFR, EPCAM, FH, FLCN, GATA2, GPC3, GREM1, HOXB13, HRAS, KIT, MAX, MEN1, MET, MITF, MLH1, MSH2, MSH3, MSH6, MUTYH, NBN, NF1, NF2, NTHL1, PALB2, PDGFRA, PHOX2B, PMS2, POLD1, POLE, POT1, PRKAR1A, PTCH1, PTEN, RAD50, RAD51C, RAD51D, RB1, RECQL4, RET, RUNX1, SDHA, SDHAF2, SDHB, SDHC, SDHD, SMAD4, SMARCA4, SMARCB1, SMARCE1, STK11, SUFU, TERC, TERT, TMEM127, TP53, TSC1, TSC2, VHL, WRN, WT1).   . Once the lab receives his specimen, results should be available in approximately 2-3 weeks, at which point we will contact him and address implications for him as well as address genetic testing for at-risk family members, if needed.     Dr. Grayland Ormond was available for questions concerning this case. Total time spent by counseling by phone was approximately 20 minutes.    _____________________________________________________________________   History of Present Illness: Jordan [name], a 67 y.o. male, was referred for genetic counseling to discuss the possibility of a hereditary predisposition to cancer and discuss whether genetic testing is warranted. This was a telegenetics visit via phone while he was in the hospital. His sister, Edwena Blow, was present for the session and helped in answering questions regarding family history.  Jordan [name] was recently diagnosed with urothelia cancer at the age of 12.  He also has a history of colon cancers (3 separate primaries) in 2013 at the age of 6. He is s/p right hemicolectomy    Urothelial cancer ([place])   11/03/2017 Initial Diagnosis    Urothelial cancer ([place])    11/03/2017 -  Chemotherapy    The patient had gemcitabine (GEMZAR) 190 mg in sodium chloride 0.9 % 100 mL chemo infusion, 100 mg/m2 = 190 mg (original dose ), Intravenous,  Once, 0 of 4 cycles Dose modification: 100 mg/m2 (Cycle 1, Reason: Other (see comments))  for chemotherapy treatment.     11/10/2017 - 11/10/2017 Chemotherapy    The patient had gemcitabine (GEMZAR) 1,862 mg in sodium chloride 0.9 % 100 mL chemo infusion, 1,000 mg/m2, Intravenous,  Once, 0 of 6 cycles  for chemotherapy treatment.      Past Medical History:  Diagnosis Date  . Atrial fibrillation ([place])   . Cancer ([place])    a. 06/2011 b. s/p right hemicolectomy/ colon CA  . Colon polyps   . Dysrhythmia   . Family history of cancer   . Hypertension   . Hyperthyroidism  a. 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 Nursing home resident   . Stroke ([place]) 03/2012   a. residual right sided weakness  . Type 1 diabetes ([place])    a. on insulin b. prior DKA     Past Surgical History:  Procedure Laterality Date  . CARDIOVERSION N/A 03/25/2014   Procedure: CARDIOVERSION;  Surgeon: Lelon Perla, MD;  [facility] END[facility]COPY;  Service: Cardiovascular;   Laterality: N/A;  . COLONOSCOPY    . COLONOSCOPY WITH PROPOFOL N/A 07/24/2017   Procedure: COLONOSCOPY WITH PROPOFOL;  Surgeon: Toledo, Benay Pike, MD;  Location: [facility] ENDOSCOPY;  Service: Gastroenterology;  Laterality: N/A;  . CYSTOSCOPY W/ RETROGRADES Bilateral 10/23/2017   Procedure: CYSTOSCOPY WITH RETROGRADE PYELOGRAM;  Surgeon: Hollice Espy, MD;  Location: [facility] ORS;  Service: Urology;  Laterality: Bilateral;  . CYSTOSCOPY WITH RETROGRADE URETHROGRAM Bilateral 10/23/2017   Procedure: CYSTOSCOPY WITH antegrade nephrostogram;  Surgeon: Hollice Espy, MD;  Location: [facility] ORS;  Service: Urology;  Laterality: Bilateral;  . CYSTOSCOPY WITH STENT PLACEMENT Bilateral 10/23/2017   Procedure: CYSTOSCOPY WITH STENT PLACEMENT;  Surgeon: Hollice Espy, MD;  Location: [facility] ORS;  Service: Urology;  Laterality: Bilateral;  . HEMICOLECTOMY Right   . IR NEPHROSTOMY PLACEMENT LEFT  09/22/2017  . IR NEPHROSTOMY PLACEMENT RIGHT  09/22/2017  . TRANSURETHRAL RESECTION OF BLADDER TUMOR N/A 10/23/2017   Procedure: TRANSURETHRAL RESECTION OF BLADDER TUMOR (TURBT);  Surgeon: Hollice Espy, MD;  Location: [facility] ORS;  Service: Urology;  Laterality: N/A;    Family History: Significant diagnoses include the following:  Family History  Problem Relation Age of Onset  . Heart attack Mother        deceased 6  . Diabetes Mother   . Hypertension Mother   . Heart attack Father        deceased 42  . Colon cancer Brother 14       currently 6  . Lung cancer Brother        smoker; deceased 9  . Cancer Maternal Aunt        unk. primary; deceased 60    Additionally, Jordan [name] has no children. He has 3 sisters and had 3 brothers; 2 have passed away unrelated to cancer. His mother died at 35. She had 5 brothers and 4 sisters. His father died at 71. He had 4 brothers and 4 sisters.  Jordan [name]'s ancestry is Caucasian - NOS. There is no known Jewish ancestry and no consanguinity.  Discussion: We reviewed the  characteristics, features and inheritance patterns of hereditary cancer syndromes. We discussed his risk of harboring a mutation in the context of his personal and family history. We discussed the process of genetic testing, insurance coverage and implications of results: positive, negative and variant of uncertain significance (VUS).   Jordan [name] questions were answered to his satisfaction today and he is welcome to call with any additional questions or concerns. Thank you for the referral and allowing [place] to share in the care of your patient.    Steele Berg, MS, [place] Certified Genetic Counselor phone: [phone]

## 2017-11-05 NOTE — Progress Notes (Signed)
Unable to complete pt's profile at this time due to pt's confusion. Pt is alert to self only. There is an active order for blood transfusion for Hgb of 7.6. Unable to find a consent or note from the ED doctor or nurse regarding the consent for blood transfusion.  I was not able to obtain a consent from the pt due to his mental status. I contacted MD Duane Boston to see if a consent could be obtained from a family member (no HCPOA on file) and MD Duane Boston stated that she was aware that the pt has history of dementia, MD felt that the pt was mentally capable to consent for himself and was not going to call the family at this time in the morning. I made MD aware that I was not going to administer this unit of blood on  my shift because I have no consent for the transfusion. I will continue to monitor pt's urine color, signs and symptoms for now as instructed by MD.

## 2017-11-05 NOTE — Progress Notes (Signed)
[facility]  ROUNDING NOTE   Subjective:  Patient seen at bedside.   We recently saw him back in the office. We noted that his nephrostomies were capped recently.  Thereafter renal function has been deteriorating. He was recently admitted withgeneralized weakness and confusion. After nephrostomies were uncapped urine output increased significantly. Creatinine down to 6.9 today.   Objective:  Vital signs in last 24 hours:  Temp:  [98.1 F (36.7 C)-99.4 F (37.4 C)] 98.3 F (36.8 C) (10/01 1247) Pulse Rate:  [67-79] 71 (10/01 1247) Resp:  [16-18] 18 (10/01 1247) BP: (137-165)/(62-78) 149/74 (10/01 1247) SpO2:  [97 %-100 %] 97 % (10/01 1247) Weight:  [74.3 kg] 74.3 kg (10/01 0155)  Weight change:  Filed Weights   11/05/17 0155  Weight: 74.3 kg    Intake/Output: I/O last 3 completed shifts: In: 2250 [IV Piggyback:2250] Out: 61 [Urine:3560]   Intake/Output this shift:  Total I/O In: -  Out: 2375 [Urine:2375]  Physical Exam: General: No acute distress  Head: Normocephalic, atraumatic. Moist oral mucosal membranes  Eyes: Anicteric  Neck: Supple, trachea midline  Lungs:  Clear to auscultation, normal effort  Heart: S1S2 no rubs  Abdomen:  Soft, nontender, bowel sounds present, bilateral nephrostomies in palce  Extremities: trace peripheral edema.  Neurologic: Awake, alert, following commands  Skin: No lesions       Basic Metabolic Panel: Recent Labs  Lab 10/31/17 1630 11/04/17 2007 11/05/17 0611  NA 129* 127* 133*  K 4.5 4.6 4.7  CL 102 98 107  CO2 18* 17* 17*  GLUCOSE 337* 228* 232*  BUN 41* 70* 59*  CREATININE 3.30* 7.89* 6.86*  CALCIUM 8.6* 8.2* 8.1*    Liver Function Tests: Recent Labs  Lab 10/31/17 1630 11/04/17 2007  AST 18 14*  ALT 15 12  ALKPHOS 103 99  BILITOT 0.4 0.5  PROT 7.7 6.9  ALBUMIN 2.7* 2.5*   No results for input(s): LIPASE, AMYLASE in the last 168 hours. No results for input(s): AMMONIA in the last 168  hours.  CBC: Recent Labs  Lab 10/31/17 1630 11/04/17 2007 11/05/17 0611  WBC 10.2 12.4* 10.6  NEUTROABS 7.9* 10.2*  --   HGB 8.3* 7.6* 7.2*  HCT 24.4* 22.1* 20.6*  MCV 88.3 85.5 86.9  PLT 662* 606* 573*    Cardiac Enzymes: No results for input(s): CKTOTAL, CKMB, CKMBINDEX, TROPONINI in the last 168 hours.  BNP: Invalid input(s): POCBNP  CBG: Recent Labs  Lab 11/05/17 0805 11/05/17 0923 11/05/17 1212  GLUCAP 246* 273* 254*    Microbiology: Results for orders placed or performed during the hospital encounter of 10/25/17  Urine culture     Status: None   Collection Time: 10/25/17  7:15 PM  Result Value Ref Range Status   Specimen Description   Final    URINE, RANDOM Performed at [facility], [address] [place], [place] [address]    Special Requests   Final    NONE Performed at [facility], [address], [place], [place] [address]    Culture   Final    NO GROWTH Performed at [facility], [place] [address] [place], [place] [address]    Report Status 10/27/2017 FINAL  Final  Culture, blood (routine x 2)     Status: None   Collection Time: 10/25/17  8:12 PM  Result Value Ref Range Status   Specimen Description BLOOD RIGHT ANTECUBITAL  Final   Special Requests   Final    BOTTLES  DRAWN AEROBIC AND ANAEROBIC Blood Culture results may not be optimal due to an excessive volume of blood received in culture bottles   Culture   Final    NO GROWTH 5 DAYS Performed at [facility], [address] [place], [place] [address]    Report Status 10/30/2017 FINAL  Final  Culture, blood (routine x 2)     Status: None   Collection Time: 10/25/17  8:12 PM  Result Value Ref Range Status   Specimen Description BLOOD BLOOD LEFT WRIST  Final   Special Requests   Final    BOTTLES DRAWN AEROBIC AND ANAEROBIC Blood Culture results may not be optimal due to an inadequate volume of blood received in culture bottles   Culture   Final    NO  GROWTH 5 DAYS Performed at [facility], [address] [place], [place] [address]    Report Status 10/30/2017 FINAL  Final  MRSA PCR Screening     Status: None   Collection Time: 10/26/17 12:57 AM  Result Value Ref Range Status   MRSA by PCR NEGATIVE NEGATIVE Final    Comment:        The GeneXpert MRSA Assay (FDA approved for NASAL specimens only), is one component of a comprehensive MRSA colonization surveillance program. It is not intended to diagnose MRSA infection nor to guide or monitor treatment for MRSA infections. Performed at [facility], [address] [place],  [address]     Coagulation Studies: Recent Labs    11/04/17 22-May-2005  LABPROT 26.7*  INR 2.49    Urinalysis: Recent Labs    11/04/17 1936  COLORURINE AMBER*  LABSPEC 1.014  PHURINE TEST NOT REPORTED DUE TO COLOR INTERFERENCE OF URINE PIGMENT  GLUCOSEU TEST NOT REPORTED DUE TO COLOR INTERFERENCE OF URINE PIGMENT*  HGBUR TEST NOT REPORTED DUE TO COLOR INTERFERENCE OF URINE PIGMENT*  BILIRUBINUR TEST NOT REPORTED DUE TO COLOR INTERFERENCE OF URINE PIGMENT*  KETONESUR TEST NOT REPORTED DUE TO COLOR INTERFERENCE OF URINE PIGMENT*  PROTEINUR TEST NOT REPORTED DUE TO COLOR INTERFERENCE OF URINE PIGMENT*  NITRITE TEST NOT REPORTED DUE TO COLOR INTERFERENCE OF URINE PIGMENT*  LEUKOCYTESUR TEST NOT REPORTED DUE TO COLOR INTERFERENCE OF URINE PIGMENT*      Imaging: Ct Renal Stone Study  Result Date: 11/04/2017 CLINICAL DATA:  Pt has some confusion per ems and this is his baseline. Pt had lab work done and his creatinine was elevated and his hemoglobin has dropped. Pt has hx of GU cancer. Pt has Foley cath in place and the urine appears red. Patient underwent trans urethral resection of bladder tumor on 10/23/2017. EXAM: CT ABDOMEN AND PELVIS WITHOUT CONTRAST TECHNIQUE: Multidetector CT imaging of the abdomen and pelvis was performed following the standard protocol without IV contrast.  COMPARISON:  09/22/2017 FINDINGS: Lower chest: Small bilateral pleural effusions and bibasilar atelectasis. Heart size is normal. There is coronary artery calcification. Hepatobiliary: A 5 millimeter low-attenuation lesion is identified in the RIGHT hepatic lobe, not further characterized. No radiopaque gallstones, biliary dilatation, or pericholecystic inflammatory changes. Pancreas: Unremarkable. No pancreatic ductal dilatation or surrounding inflammatory changes. Spleen: Normal in size without focal abnormality. Adrenals/Urinary Tract: Adrenal glands are normal in appearance. There is bilateral hydronephrosis. There are bilateral percutaneous nephrostomies. There are bilateral double-J ureteral stents traversing dilated ureters. A Foley catheter is in place. The degree of hydronephrosis is similar compared with previous exams. Stomach/Bowel: The stomach is normal in appearance. Small bowel loops are unremarkable. Status post ascending  colectomy. Moderate stool within the transverse colon and sigmoid colon. There is thickening of the rectal wall, associated with stranding in the perirectal fat. Soft tissue density along the LEFT aspect of the rectum measures 2.1 x 5.1 centimeters and may represent inflammatory change or tumor. There is thickening along the distal portions of the ureters bilaterally, LEFT greater than RIGHT. Vascular/Lymphatic: There is atherosclerotic calcification of the abdominal aorta. No associated aneurysm. Enlarged periaortic lymph nodes are identified, measuring up to 11 millimeters at the level of the renal veins. Reproductive: Prostate is unremarkable. Other: No abdominal wall hernia or abnormality. No abdominopelvic ascites. Musculoskeletal: Degenerative changes are identified primarily at L5-S1. Degenerative changes are seen in both hips. IMPRESSION: 1. Bilateral percutaneous nephrostomies and bilateral ureteral stents appear in good position. 2. Degree of hydronephrosis appears similar  to the previous exam, prior to stent placement. 3. Foley catheter decompresses the bladder. 4. Thickening of the rectum, consistent with infectious, inflammatory change, or tumor. Soft tissue along the LEFT aspect of the rectum likely represents tumor. 5. Coronary artery calcification. 6.  Aortic atherosclerosis.  (ICD10-I70.0) 7. Status post ascending colectomy. 8. Small bilateral effusions. Electronically Signed   By: Nolon Nations M.D.   On: 11/04/2017 20:11     Medications:   . sodium chloride Stopped (11/04/17 2207)  . sodium chloride Stopped (11/04/17 2221)  . sodium chloride 100 mL/hr at 11/05/17 1307  . cefTRIAXone (ROCEPHIN)  IV 1 g (11/05/17 1020)   . amiodarone  200 mg Oral Daily  . amLODipine  10 mg Oral Daily  . digoxin  0.0625 mg Oral Daily  . docusate sodium  100 mg Oral BID  . famotidine  20 mg Oral Daily  . folic acid  1 mg Oral Daily  . insulin aspart  0-5 Units Subcutaneous QHS  . insulin aspart  0-9 Units Subcutaneous TID WC  . insulin glargine  12 Units Subcutaneous Daily  . loratadine  10 mg Oral Daily  . metoprolol tartrate  25 mg Oral BID  . multivitamin with minerals  1 tablet Oral Daily  . protein supplement shake  11 oz Oral BID BM  . rosuvastatin  5 mg Oral Daily  . tamsulosin  0.4 mg Oral QPC supper  . ascorbic acid  250 mg Oral BID   acetaminophen **OR** acetaminophen, bisacodyl, HYDROcodone-acetaminophen, ondansetron **OR** ondansetron (ZOFRAN) IV, traZODone  Assessment/ Plan:  67 y.o. 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    1.  Acute renal failure secondary to obstructive uropathy after nephrostomies. 2.  Hyponatremia. 3.  Metabolic acidosis secondary to hydronephrosis. 4.  Altered  mental status secondary to uremia.  Plan: We'll follow the patient closely in the office.  Patient is noted as having worsening renal function after nephrostomies were.  The nephrostomies are now decapped and urine output has improved.  Creatinine down to 6.86.  Serum sodium also improved to 133.  Therefore no urgent indication for dialysis at the moment.  We will continue to monitor his progress very closely and if there is deterioration of renal function we may need to consider temporary dialysis.  Further plan as patient progresses.  Management of blood sugars as per hospitalist.   LOS: 1 Mykael Trott 10/1/20192:12 PM

## 2017-11-06 ENCOUNTER — Telehealth: Payer: Self-pay | Admitting: Urology

## 2017-11-06 ENCOUNTER — Ambulatory Visit: Admission: RE | Admit: 2017-11-06 | Payer: Medicare Other | Source: Ambulatory Visit | Admitting: Vascular Surgery

## 2017-11-06 ENCOUNTER — Encounter
Admission: EM | Disposition: A | Discharge status: Skilled Nursing Facility | Payer: Self-pay | Source: Home / Self Care | Attending: Internal Medicine

## 2017-11-06 DIAGNOSIS — R310 Gross hematuria: Secondary | ICD-10-CM

## 2017-11-06 DIAGNOSIS — N390 Urinary tract infection, site not specified: Secondary | ICD-10-CM

## 2017-11-06 DIAGNOSIS — N1330 Unspecified hydronephrosis: Secondary | ICD-10-CM

## 2017-11-06 DIAGNOSIS — N179 Acute kidney failure, unspecified: Secondary | ICD-10-CM

## 2017-11-06 DIAGNOSIS — C680 Malignant neoplasm of urethra: Secondary | ICD-10-CM

## 2017-11-06 DIAGNOSIS — N189 Chronic kidney disease, unspecified: Secondary | ICD-10-CM

## 2017-11-06 LAB — BASIC METABOLIC PANEL
ANION GAP: 8 (ref 5–15)
BUN: 55 mg/dL — ABNORMAL HIGH (ref 8–23)
CALCIUM: 8.6 mg/dL — AB (ref 8.9–10.3)
CO2: 19 mmol/L — ABNORMAL LOW (ref 22–32)
CREATININE: 4.77 mg/dL — AB (ref 0.61–1.24)
Chloride: 112 mmol/L — ABNORMAL HIGH (ref 98–111)
GFR calc Af Amer: 13 mL/min — ABNORMAL LOW (ref >60)
GFR, EST NON AFRICAN AMERICAN: 11 mL/min — AB (ref >60)
GLUCOSE: 329 mg/dL — AB (ref 70–99)
Potassium: 5.7 mmol/L — ABNORMAL HIGH (ref 3.5–5.1)
Sodium: 139 mmol/L (ref 135–145)

## 2017-11-06 LAB — GLUCOSE, CAPILLARY
GLUCOSE-CAPILLARY: 330 mg/dL — AB (ref 70–99)
GLUCOSE-CAPILLARY: 397 mg/dL — AB (ref 70–99)
GLUCOSE-CAPILLARY: 300 mg/dL — AB (ref 70–99)
GLUCOSE-CAPILLARY: 157 mg/dL — AB (ref 70–99)
Glucose-Capillary: 314 mg/dL — ABNORMAL HIGH (ref 70–99)

## 2017-11-06 LAB — CBC
HCT: 25.2 % — ABNORMAL LOW (ref 40.0–52.0)
Hemoglobin: 8.8 g/dL — ABNORMAL LOW (ref 13.0–18.0)
MCH: 30.1 pg (ref 26.0–34.0)
MCHC: 34.9 g/dL (ref 32.0–36.0)
MCV: 86.4 fL (ref 80.0–100.0)
PLATELETS: 659 10*3/uL — AB (ref 150–440)
RBC: 2.91 MIL/uL — ABNORMAL LOW (ref 4.40–5.90)
RDW: 14.6 % — AB (ref 11.5–14.5)
WBC: 9.1 10*3/uL (ref 3.8–10.6)

## 2017-11-06 LAB — GLUCOSE, RANDOM: Glucose, Bld: 371 mg/dL — ABNORMAL HIGH (ref 70–99)

## 2017-11-06 LAB — POTASSIUM: Potassium: 4.3 mmol/L (ref 3.5–5.1)

## 2017-11-06 LAB — HEMOGLOBIN A1C
Hgb A1c MFr Bld: 9 % — ABNORMAL HIGH (ref 4.8–5.6)
MEAN PLASMA GLUCOSE: 212 mg/dL

## 2017-11-06 SURGERY — PORTA CATH INSERTION
Anesthesia: Moderate Sedation

## 2017-11-06 MED ORDER — SODIUM POLYSTYRENE SULFONATE 15 GM/60ML PO SUSP
30.0000 g | Freq: Once | ORAL | Status: AC
  Administered 2017-11-06: 30 g via ORAL
  Filled 2017-11-06: qty 120

## 2017-11-06 MED ORDER — INSULIN ASPART 100 UNIT/ML ~~LOC~~ SOLN: 0.0000 [IU] | Freq: Three times a day (TID) | SUBCUTANEOUS | Status: DC

## 2017-11-06 MED ORDER — DEXTROSE 50 % IV SOLN: 1.0000 | Freq: Once | INTRAVENOUS | Status: DC

## 2017-11-06 MED ORDER — SODIUM ZIRCONIUM CYCLOSILICATE 5 G PO PACK
10.0000 g | PACK | Freq: Every day | ORAL | Status: DC
  Administered 2017-11-06: 10 g via ORAL
  Filled 2017-11-06 (×2): qty 2

## 2017-11-06 MED ORDER — HEPARIN SODIUM (PORCINE) 5000 UNIT/ML IJ SOLN
5000.0000 [IU] | Freq: Three times a day (TID) | INTRAMUSCULAR | Status: DC
  Administered 2017-11-06 – 2017-11-11 (×13): 5000 [IU] via SUBCUTANEOUS
  Filled 2017-11-06 (×13): qty 1

## 2017-11-06 MED ORDER — INSULIN ASPART 100 UNIT/ML IV SOLN
10.0000 [IU] | Freq: Once | INTRAVENOUS | Status: AC
  Administered 2017-11-06: 07:00:00 10 [IU] via INTRAVENOUS
  Filled 2017-11-06: qty 0.1

## 2017-11-06 MED ORDER — INSULIN ASPART 100 UNIT/ML ~~LOC~~ SOLN
0.0000 [IU] | Freq: Three times a day (TID) | SUBCUTANEOUS | Status: DC
  Administered 2017-11-06: 8 [IU] via SUBCUTANEOUS
  Administered 2017-11-06: 13:00:00 15 [IU] via SUBCUTANEOUS
  Administered 2017-11-07: 3 [IU] via SUBCUTANEOUS
  Administered 2017-11-07 (×2): 2 [IU] via SUBCUTANEOUS
  Administered 2017-11-08: 15 [IU] via SUBCUTANEOUS
  Administered 2017-11-09: 2 [IU] via SUBCUTANEOUS
  Administered 2017-11-09: 17:00:00 3 [IU] via SUBCUTANEOUS
  Administered 2017-11-09: 12:00:00 2 [IU] via SUBCUTANEOUS
  Administered 2017-11-10: 5 [IU] via SUBCUTANEOUS
  Administered 2017-11-10: 8 [IU] via SUBCUTANEOUS
  Administered 2017-11-10: 14:00:00 5 [IU] via SUBCUTANEOUS
  Administered 2017-11-11: 08:00:00 8 [IU] via SUBCUTANEOUS
  Administered 2017-11-11: 5 [IU] via SUBCUTANEOUS
  Filled 2017-11-06 (×14): qty 1

## 2017-11-06 MED ORDER — SODIUM ZIRCONIUM CYCLOSILICATE 5 G PO PACK
10.0000 g | PACK | Freq: Two times a day (BID) | ORAL | Status: DC
  Filled 2017-11-06: qty 2

## 2017-11-06 MED ORDER — INSULIN GLARGINE 100 UNIT/ML ~~LOC~~ SOLN
13.0000 [IU] | Freq: Once | SUBCUTANEOUS | Status: AC
  Administered 2017-11-06: 13:00:00 13 [IU] via SUBCUTANEOUS
  Filled 2017-11-06: qty 0.13

## 2017-11-06 MED ORDER — ALBUTEROL SULFATE (2.5 MG/3ML) 0.083% IN NEBU: 10.0000 mg | INHALATION_SOLUTION | Freq: Once | RESPIRATORY_TRACT | Status: DC

## 2017-11-06 MED ORDER — INSULIN GLARGINE 100 UNIT/ML ~~LOC~~ SOLN
25.0000 [IU] | Freq: Every day | SUBCUTANEOUS | Status: DC
  Administered 2017-11-07 – 2017-11-11 (×5): 25 [IU] via SUBCUTANEOUS
  Filled 2017-11-06 (×6): qty 0.25

## 2017-11-06 MED ORDER — CEFAZOLIN SODIUM-DEXTROSE 2-4 GM/100ML-% IV SOLN
2.0000 g | INTRAVENOUS | Status: AC
  Administered 2017-11-07: 2 g via INTRAVENOUS
  Filled 2017-11-06: qty 100

## 2017-11-06 MED ORDER — INSULIN ASPART 100 UNIT/ML ~~LOC~~ SOLN
6.0000 [IU] | Freq: Three times a day (TID) | SUBCUTANEOUS | Status: DC
  Administered 2017-11-06 – 2017-11-11 (×6): 6 [IU] via SUBCUTANEOUS
  Filled 2017-11-06 (×8): qty 1

## 2017-11-06 MED ORDER — AMOXICILLIN 500 MG PO CAPS
500.0000 mg | ORAL_CAPSULE | Freq: Two times a day (BID) | ORAL | Status: DC
  Administered 2017-11-06: 23:00:00 500 mg via ORAL
  Filled 2017-11-06 (×2): qty 1

## 2017-11-06 MED ORDER — SODIUM BICARBONATE 8.4 % IV SOLN
50.0000 meq | Freq: Once | INTRAVENOUS | Status: AC
  Administered 2017-11-06: 50 meq via INTRAVENOUS
  Filled 2017-11-06: qty 50

## 2017-11-06 NOTE — Telephone Encounter (Signed)
-----   Message from Hollice Espy, MD sent at 11/06/2017  8:48 AM EDT ----- Regarding: f/u This is an inpatient who needs to be seen in about 6 weeks with me.  He has a follow-up in 2 weeks which can be canceled.  Hollice Espy, MD

## 2017-11-06 NOTE — Clinical Social Work Note (Signed)
Clinical Social Work Assessment  Patient Details  Name: Jordan [name] MRN: [id number] Date of Birth: Dec 26, 1950  Date of referral:  11/06/17               Reason for consult:  Facility Placement                Permission sought to share information with:  Case Manager, Customer service manager, Family Supports Permission granted to share information::  Yes, Verbal Permission Granted  Name::        Agency::     Relationship::     Contact Information:     Housing/Transportation Living arrangements for the past 2 months:  [place] of Information:  Patient Patient Interpreter Needed:  None Criminal Activity/Legal Involvement Pertinent to Current Situation/Hospitalization:  No - Comment as needed Significant Relationships:  Siblings Lives with:  Self Do you feel safe going back to the place where you live?  Yes Need for family participation in patient care:  Yes (Comment)  Care giving concerns:  Patient is from [facility]   Social Worker assessment / plan:  CSW consulted for facility placement. CSW met with patient to discuss discharge planning. CSW introduced self and explained role. Patient was recently discharged from here to [facility] for rehab. Patient states that prior to last hospitalization, he was living alone. Patient reports that he would like to return to [facility] for rehab before going home. CSW spoke with Otila Kluver at [facility] and she confirms that patient can return when ready for discharge. Patient will need a new U[employer] authorization at discharge as well. CSW will continue to follow for discharge planning.   Employment status:  Retired Nurse, adult PT Recommendations:  [place] / Referral to community resources:  [place]  Patient/Family's Response to care:  Patient thanked CSW for assistance   Patient/Family's Understanding of and Emotional Response to Diagnosis, Current  Treatment, and Prognosis:  Patient in agreement with discharge plan   Emotional Assessment Appearance:  Appears stated age Attitude/Demeanor/Rapport:    Affect (typically observed):  Accepting, Pleasant, Hopeful Orientation:  Oriented to Self, Oriented to Place, Oriented to  Time Alcohol / Substance use:  Not Applicable Psych involvement (Current and /or in the community):  No (Comment)  Discharge Needs  Concerns to be addressed:  Discharge Planning Concerns Readmission within the last 30 days:  Yes Current discharge risk:  None Barriers to Discharge:  Continued Medical Work up   [employer], [place] 11/06/2017, 2:09 PM

## 2017-11-06 NOTE — Evaluation (Signed)
Physical Therapy Evaluation Patient Details Name: Jordan [name] MRN: [id number] DOB: 1950/12/21 Today's Date: 11/06/2017   History of Present Illness  Patient is a 67 year old male admitted from SNF with an acute UTI. Recent medical history includes removal of a bladder mass (1 mo. ago).  He also has a PMH of stroke, bladder CA and atrial fibrillation.  Clinical Impression  Patient is a 67 year old male admitted with above stated diagnosis.  Pt in bed and requiring a brief and bedding change when PT arrived.  PT and NA assisted pt with bed mobility and encouraged pt to participate as much as possible.  He required mod A for bed mobility and to maintain sidelying due to fatigue and pain.  Pt refused getting to EOB today, stating that he didn't think he was able and that he was experiencing too much pain in his "butt".  Pt presented with weakness of UE's and LE's throughout evaluation.  PT discussed importance of frequent mobility but deferred transfer due to pt becoming visibly agitated and anxious.  Pt will continue to benefit from skilled PT with focus on tolerance to activity, safe functional mobility and strengthening.    Follow Up Recommendations SNF    Equipment Recommendations  None recommended by PT    Recommendations for Other Services       Precautions / Restrictions Precautions Precautions: Fall Restrictions Weight Bearing Restrictions: No      Mobility  Bed Mobility Overal bed mobility: Needs Assistance Bed Mobility: Rolling Rolling: Min guard         General bed mobility comments: Able to hold to bed rails for support when rolling to change brief and bedding.  Pt refused sitting up to EOB  Transfers                    Ambulation/Gait                Stairs            Wheelchair Mobility    Modified Rankin (Stroke Patients Only)       Balance Overall balance assessment: (Did not test.)                                           Pertinent Vitals/Pain Pain Assessment: Faces Faces Pain Scale: Hurts little more Pain Location: When asked where he was experiencing pain, pt replied, "my butt". Pain Intervention(s): Limited activity within patient's tolerance    Home Living Family/patient expects to be discharged to:: Skilled nursing facility                      Prior Function Level of Independence: Needs assistance   Gait / Transfers Assistance Needed: Assistance with transfers  ADL's / [place] Needed: Assistance with toileting, bathing and dressing.  Comments: Unsure if hx provided by pt is accurate.  He stated that he has not been out of bed and has only been doing bed exercises with PT at SNF.     Hand Dominance        Extremity/Trunk Assessment   Upper Extremity Assessment Upper Extremity Assessment: Generalized weakness    Lower Extremity Assessment Lower Extremity Assessment: Generalized weakness       Communication   Communication: No difficulties  Cognition Arousal/Alertness: Awake/alert Behavior During Therapy: Restless;Anxious Overall Cognitive Status: No family/caregiver present  to determine baseline cognitive functioning                                        General Comments      Exercises Other Exercises Other Exercises: Assisted with bed mobility during changing of brief and bedding. x5 min Other Exercises: Educated pt concerning importance of frequent movement to prevent deconditioning and to prevent pressure ulcers. x1 min   Assessment/Plan    PT Assessment Patient needs continued PT services  PT Problem List Decreased strength;Decreased mobility;Decreased activity tolerance       PT Treatment Interventions DME instruction;Therapeutic activities;Gait training;Patient/family education;Therapeutic exercise;Cognitive remediation;Balance training;Functional mobility training    PT Goals (Current goals can be found in the  Care Plan section)  Acute Rehab PT Goals PT Goal Formulation: All assessment and education complete, DC therapy    Frequency Min 2X/week   Barriers to discharge        Co-evaluation               AM-PAC PT "6 Clicks" Daily Activity  Outcome Measure Difficulty turning over in bed (including adjusting bedclothes, sheets and blankets)?: A Lot Difficulty moving from lying on back to sitting on the side of the bed? : Unable Difficulty sitting down on and standing up from a chair with arms (e.g., wheelchair, bedside commode, etc,.)?: Unable Help needed moving to and from a bed to chair (including a wheelchair)?: Total Help needed walking in hospital room?: Total Help needed climbing 3-5 steps with a railing? : Total 6 Click Score: 7    End of Session   Activity Tolerance: Patient limited by pain Patient left: in bed;with nursing/sitter in room;with call bell/phone within reach   PT Visit Diagnosis: Muscle weakness (generalized) (M62.81)    Time: [id number] PT Time Calculation (min) (ACUTE ONLY): 15 min   Charges:   PT Evaluation $PT Eval Low Complexity: 1 Low          Roxanne Gates, PT, DPT   Roxanne Gates 11/06/2017, 9:53 AM

## 2017-11-06 NOTE — Progress Notes (Signed)
Urology Consult Follow Up  Subjective: Patient complaining of penile pain.  Foley to be discontinued.   Creatinine 4.77.  Good UOP.  Clear yellow urine from both nephrostomy tubes.    Anti-infectives: Anti-infectives (From admission, onward)   Start     Dose/Rate Route Frequency Ordered Stop   11/05/17 1000  cefTRIAXone (ROCEPHIN) 1 g in sodium chloride 0.9 % 100 mL IVPB     1 g 200 mL/hr over 30 Minutes Intravenous Every 24 hours 11/05/17 0808     11/04/17 2045  piperacillin-tazobactam (ZOSYN) IVPB 3.375 g     3.375 g 100 mL/hr over 30 Minutes Intravenous  Once 11/04/17 2041 11/04/17 2326   11/04/17 2045  vancomycin (VANCOCIN) IVPB 1000 mg/200 mL premix     1,000 mg 200 mL/hr over 60 Minutes Intravenous  Once 11/04/17 2041 11/04/17 2326      Current Facility-Administered Medications  Medication Dose Route Frequency Provider Last Rate Last Dose  . 0.9 %  sodium chloride infusion   Intravenous Continuous Amelia Jo, MD   Stopped at 11/06/17 [phone]  . acetaminophen (TYLENOL) tablet 650 mg  650 mg Oral Q6H PRN Amelia Jo, MD       Or  . acetaminophen (TYLENOL) suppository 650 mg  650 mg Rectal Q6H PRN Amelia Jo, MD      . albuterol (PROVENTIL) (2.5 MG/3ML) 0.083% nebulizer solution 10 mg  10 mg Nebulization Once Arta Silence, MD      . amiodarone (PACERONE) tablet 200 mg  200 mg Oral Daily Amelia Jo, MD   200 mg at 11/05/17 1013  . amLODipine (NORVASC) tablet 10 mg  10 mg Oral Daily Amelia Jo, MD   10 mg at 11/05/17 1011  . bisacodyl (DULCOLAX) EC tablet 5 mg  5 mg Oral Daily PRN Amelia Jo, MD      . cefTRIAXone (ROCEPHIN) 1 g in sodium chloride 0.9 % 100 mL IVPB  1 g Intravenous Q24H Gladstone Lighter, MD   Stopped at 11/05/17 1050  . dextrose 50 % solution 50 mL  1 ampule Intravenous Once Arta Silence, MD      . digoxin (LANOXIN) tablet 0.0625 mg  0.0625 mg Oral Daily Amelia Jo, MD   0.0625 mg at 11/05/17 1009  . docusate sodium (COLACE) capsule  100 mg  100 mg Oral BID Amelia Jo, MD   100 mg at 11/05/17 2206  . famotidine (PEPCID) tablet 20 mg  20 mg Oral Daily Amelia Jo, MD   20 mg at 11/05/17 1013  . folic acid (FOLVITE) tablet 1 mg  1 mg Oral Daily Amelia Jo, MD   1 mg at 11/05/17 1010  . HYDROcodone-acetaminophen (NORCO/VICODIN) 5-325 MG per tablet 1-2 tablet  1-2 tablet Oral Q4H PRN Amelia Jo, MD      . insulin aspart (novoLOG) injection 0-5 Units  0-5 Units Subcutaneous QHS Gladstone Lighter, MD   5 Units at 11/05/17 2214  . insulin aspart (novoLOG) injection 0-9 Units  0-9 Units Subcutaneous TID WC Gladstone Lighter, MD   9 Units at 11/05/17 1737  . insulin glargine (LANTUS) injection 12 Units  12 Units Subcutaneous Daily Gladstone Lighter, MD   12 Units at 11/05/17 1424  . loratadine (CLARITIN) tablet 10 mg  10 mg Oral Daily Amelia Jo, MD   10 mg at 11/05/17 1011  . metoprolol tartrate (LOPRESSOR) tablet 25 mg  25 mg Oral BID Amelia Jo, MD   25 mg at 11/05/17 2206  . multivitamin with minerals tablet 1 tablet  1 tablet Oral Daily Amelia Jo, MD   1 tablet at 11/05/17 1013  . ondansetron (ZOFRAN) tablet 4 mg  4 mg Oral Q6H PRN Amelia Jo, MD       Or  . ondansetron [facility]) injection 4 mg  4 mg Intravenous Q6H PRN Amelia Jo, MD      . protein supplement (PREMIER PROTEIN) liquid - approved for s/p bariatric surgery  11 oz Oral BID BM Amelia Jo, MD   11 oz at 11/05/17 1425  . rosuvastatin (CRESTOR) tablet 5 mg  5 mg Oral Daily Amelia Jo, MD   5 mg at 11/05/17 1012  . sodium zirconium cyclosilicate (LOKELMA) packet 10 g  10 g Oral Daily Gladstone Lighter, MD      . tamsulosin (FLOMAX) capsule 0.4 mg  0.4 mg Oral QPC supper Amelia Jo, MD   0.4 mg at 11/05/17 1738  . traZODone (DESYREL) tablet 25 mg  25 mg Oral QHS PRN Amelia Jo, MD      . vitamin C (ASCORBIC ACID) tablet 250 mg  250 mg Oral BID Amelia Jo, MD   250 mg at 11/05/17 2206     Objective: Vital signs in last 24  hours: Temp:  [97.3 F (36.3 C)-98.4 F (36.9 C)] 97.3 F (36.3 C) (10/02 0543) Pulse Rate:  [71-82] 81 (10/02 0543) Resp:  [18] 18 (10/01 1905) BP: (133-161)/(60-78) 157/67 (10/02 0543) SpO2:  [97 %-100 %] 100 % (10/02 0543) Weight:  [72.4 kg] 72.4 kg (10/02 0500)  Intake/Output from previous day: 10/01 0701 - 10/02 0700 In: 2474.6 [I.V.:2098.6; Blood:276; IV Piggyback:100] Out: 2426 [Urine:7160] Intake/Output this shift: No intake/output data recorded.   Physical Exam Constitutional: Frail.  Alert and oriented, No acute distress. HEENT: [place] AT, moist mucus membranes. Trachea midline, no masses. Cardiovascular: No clubbing, cyanosis, or edema. Respiratory: Normal respiratory effort, no increased work of breathing. GI: Abdomen is soft, non tender, non distended, no abdominal masses. Liver and spleen not palpable.  No hernias appreciated.  Stool sample for occult testing is not indicated.   GU: No CVA tenderness.  No bladder fullness or masses.  Patient with circumcised phallus.  Foley in place.  No penile discharge. No penile lesions or rashes. Scrotum without lesions, cysts, rashes and/or edema.   Skin: No rashes, bruises or suspicious lesions. Lymph: No cervical or inguinal adenopathy. Neurologic: Grossly intact, no focal deficits, moving all 4 extremities. Psychiatric: Normal mood and affect.  Lab Results:  Recent Labs    11/05/17 0611 11/06/17 0415  WBC 10.6 9.1  HGB 7.2* 8.8*  HCT 20.6* 25.2*  PLT 573* 659*   BMET Recent Labs    11/05/17 0611 11/06/17 0415  NA 133* 139  K 4.7 5.7*  CL 107 112*  CO2 17* 19*  GLUCOSE 232* 329*  BUN 59* 55*  CREATININE 6.86* 4.77*  CALCIUM 8.1* 8.6*   PT/INR Recent Labs    11/04/17 2007  LABPROT 26.7*  INR 2.49   ABG No results for input(s): PHART, HCO3 in the last 72 hours.  Invalid input(s): PCO2, PO2  Studies/Results: Ct Renal Stone Study  Result Date: 11/04/2017 CLINICAL DATA:  Pt has some confusion per ems  and this is his baseline. Pt had lab work done and his creatinine was elevated and his hemoglobin has dropped. Pt has hx of GU cancer. Pt has Foley cath in place and the urine appears red. Patient underwent trans urethral resection of bladder tumor on 10/23/2017. EXAM: CT ABDOMEN AND PELVIS WITHOUT CONTRAST  TECHNIQUE: Multidetector CT imaging of the abdomen and pelvis was performed following the standard protocol without IV contrast. COMPARISON:  09/22/2017 FINDINGS: Lower chest: Small bilateral pleural effusions and bibasilar atelectasis. Heart size is normal. There is coronary artery calcification. Hepatobiliary: A 5 millimeter low-attenuation lesion is identified in the RIGHT hepatic lobe, not further characterized. No radiopaque gallstones, biliary dilatation, or pericholecystic inflammatory changes. Pancreas: Unremarkable. No pancreatic ductal dilatation or surrounding inflammatory changes. Spleen: Normal in size without focal abnormality. Adrenals/Urinary Tract: Adrenal glands are normal in appearance. There is bilateral hydronephrosis. There are bilateral percutaneous nephrostomies. There are bilateral double-J ureteral stents traversing dilated ureters. A Foley catheter is in place. The degree of hydronephrosis is similar compared with previous exams. Stomach/Bowel: The stomach is normal in appearance. Small bowel loops are unremarkable. Status post ascending colectomy. Moderate stool within the transverse colon and sigmoid colon. There is thickening of the rectal wall, associated with stranding in the perirectal fat. Soft tissue density along the LEFT aspect of the rectum measures 2.1 x 5.1 centimeters and may represent inflammatory change or tumor. There is thickening along the distal portions of the ureters bilaterally, LEFT greater than RIGHT. Vascular/Lymphatic: There is atherosclerotic calcification of the abdominal aorta. No associated aneurysm. Enlarged periaortic lymph nodes are identified,  measuring up to 11 millimeters at the level of the renal veins. Reproductive: Prostate is unremarkable. Other: No abdominal wall hernia or abnormality. No abdominopelvic ascites. Musculoskeletal: Degenerative changes are identified primarily at L5-S1. Degenerative changes are seen in both hips. IMPRESSION: 1. Bilateral percutaneous nephrostomies and bilateral ureteral stents appear in good position. 2. Degree of hydronephrosis appears similar to the previous exam, prior to stent placement. 3. Foley catheter decompresses the bladder. 4. Thickening of the rectum, consistent with infectious, inflammatory change, or tumor. Soft tissue along the LEFT aspect of the rectum likely represents tumor. 5. Coronary artery calcification. 6.  Aortic atherosclerosis.  (ICD10-I70.0) 7. Status post ascending colectomy. 8. Small bilateral effusions. Electronically Signed   By: Nolon Nations M.D.   On: 11/04/2017 20:11     Assessment and Plan: Patient with bladder cancer found with bilateral hydronephrosis and creatinine of 7.89.  1. Bilateral hydronephrosis Nephrostomy tubes are draining well with clear yellow urine Creatinine trending down -continue to trend Nephrology consulted and no need for dialysis at this time DC Foley   2. High-grade muscle invasive urothelial carcinoma He is followed by the Cancer center and urology Has an upcoming appointment with Dr. Erlene Quan on 10/17   LOS: 2 days    [facility] 11/06/2017

## 2017-11-06 NOTE — Progress Notes (Addendum)
[place] [facility] [place] NAME: Jordan [name]    MR#:  [id number]  DATE OF BIRTH:  Jun 22, 1950  SUBJECTIVE:  CHIEF COMPLAINT:   Chief Complaint  Patient presents with  . Abnormal Lab   - creatinine improving, hb improved after blood transfusion yesterday - foley removed, nephrostomies draining well  REVIEW OF SYSTEMS:  Review of Systems  Constitutional: Positive for malaise/fatigue. Negative for chills and fever.  HENT: Negative for congestion, ear discharge, hearing loss and nosebleeds.   Respiratory: Negative for cough, shortness of breath and wheezing.   Cardiovascular: Negative for chest pain, palpitations and leg swelling.  Gastrointestinal: Negative for abdominal pain, constipation, diarrhea, nausea and vomiting.  Genitourinary: Negative for dysuria.  Musculoskeletal: Negative for myalgias.  Neurological: Negative for dizziness, focal weakness, seizures, weakness and headaches.  Psychiatric/Behavioral: Negative for depression.    DRUG ALLERGIES:   Allergies  Allergen Reactions  . Amiodarone Other (See Comments)    Thyrotoxicosis per Murray Hodgkins, NP    VITALS:  Blood pressure (!) 159/84, pulse 91, temperature 98 F (36.7 C), temperature source Axillary, resp. rate 18, height 5\' 9"  (1.753 m), weight 72.4 kg, SpO2 100 %.  PHYSICAL EXAMINATION:  Physical Exam   GENERAL:  67 y.o.-year-old patient lying in the bed with no acute distress.  EYES: Pupils equal, round, reactive to light and accommodation. No scleral icterus. Extraocular muscles intact.  HEENT: Head atraumatic, normocephalic. Oropharynx and nasopharynx clear.  NECK:  Supple, no jugular venous distention. No thyroid enlargement, no tenderness.  LUNGS: Normal breath sounds bilaterally, no wheezing, rales,rhonchi or crepitation. No use of accessory muscles of respiration. Decreased bibasilar breath sounds CARDIOVASCULAR: S1, S2 normal. No murmurs, rubs, or gallops.    ABDOMEN: Soft, nontender, nondistended. Bowel sounds present. No organomegaly or mass.  - bilateral nephrostomy tubes in place EXTREMITIES: No pedal edema, cyanosis, or clubbing.  NEUROLOGIC: Cranial nerves II through XII are intact. Muscle strength 5/5 in all extremities. Sensation intact. Gait not checked. Global weakness noted. PSYCHIATRIC: The patient is alert and oriented to self  SKIN: No obvious rash, lesion, or ulcer.    LABORATORY PANEL:   CBC Recent Labs  Lab 11/06/17 0415  WBC 9.1  HGB 8.8*  HCT 25.2*  PLT 659*   ------------------------------------------------------------------------------------------------------------------  Chemistries  Recent Labs  Lab 11/04/17 2007  11/06/17 0415 11/06/17 0759  NA 127*   < > 139  --   K 4.6   < > 5.7*  --   CL 98   < > 112*  --   CO2 17*   < > 19*  --   GLUCOSE 228*   < > 329* 371*  BUN 70*   < > 55*  --   CREATININE 7.89*   < > 4.77*  --   CALCIUM 8.2*   < > 8.6*  --   AST 14*  --   --   --   ALT 12  --   --   --   ALKPHOS 99  --   --   --   BILITOT 0.5  --   --   --    < > = values in this interval not displayed.   ------------------------------------------------------------------------------------------------------------------  Cardiac Enzymes No results for input(s): TROPONINI in the last 168 hours. ------------------------------------------------------------------------------------------------------------------  RADIOLOGY:  Ct Renal Stone Study  Result Date: 11/04/2017 CLINICAL DATA:  Pt has some confusion per ems and this is his baseline. Pt had lab work done  and his creatinine was elevated and his hemoglobin has dropped. Pt has hx of GU cancer. Pt has Foley cath in place and the urine appears red. Patient underwent trans urethral resection of bladder tumor on 10/23/2017. EXAM: CT ABDOMEN AND PELVIS WITHOUT CONTRAST TECHNIQUE: Multidetector CT imaging of the abdomen and pelvis was performed following the  standard protocol without IV contrast. COMPARISON:  09/22/2017 FINDINGS: Lower chest: Small bilateral pleural effusions and bibasilar atelectasis. Heart size is normal. There is coronary artery calcification. Hepatobiliary: A 5 millimeter low-attenuation lesion is identified in the RIGHT hepatic lobe, not further characterized. No radiopaque gallstones, biliary dilatation, or pericholecystic inflammatory changes. Pancreas: Unremarkable. No pancreatic ductal dilatation or surrounding inflammatory changes. Spleen: Normal in size without focal abnormality. Adrenals/Urinary Tract: Adrenal glands are normal in appearance. There is bilateral hydronephrosis. There are bilateral percutaneous nephrostomies. There are bilateral double-J ureteral stents traversing dilated ureters. A Foley catheter is in place. The degree of hydronephrosis is similar compared with previous exams. Stomach/Bowel: The stomach is normal in appearance. Small bowel loops are unremarkable. Status post ascending colectomy. Moderate stool within the transverse colon and sigmoid colon. There is thickening of the rectal wall, associated with stranding in the perirectal fat. Soft tissue density along the LEFT aspect of the rectum measures 2.1 x 5.1 centimeters and may represent inflammatory change or tumor. There is thickening along the distal portions of the ureters bilaterally, LEFT greater than RIGHT. Vascular/Lymphatic: There is atherosclerotic calcification of the abdominal aorta. No associated aneurysm. Enlarged periaortic lymph nodes are identified, measuring up to 11 millimeters at the level of the renal veins. Reproductive: Prostate is unremarkable. Other: No abdominal wall hernia or abnormality. No abdominopelvic ascites. Musculoskeletal: Degenerative changes are identified primarily at L5-S1. Degenerative changes are seen in both hips. IMPRESSION: 1. Bilateral percutaneous nephrostomies and bilateral ureteral stents appear in good position. 2.  Degree of hydronephrosis appears similar to the previous exam, prior to stent placement. 3. Foley catheter decompresses the bladder. 4. Thickening of the rectum, consistent with infectious, inflammatory change, or tumor. Soft tissue along the LEFT aspect of the rectum likely represents tumor. 5. Coronary artery calcification. 6.  Aortic atherosclerosis.  (ICD10-I70.0) 7. Status post ascending colectomy. 8. Small bilateral effusions. Electronically Signed   By: Nolon Nations M.D.   On: 11/04/2017 20:11    EKG:   Orders placed or performed during the hospital encounter of 10/25/17  . ED EKG within 10 minutes  . ED EKG within 10 minutes  . EKG    ASSESSMENT AND PLAN:   67 year old male with past medical history significant for A. fib on Xarelto, history of colon cancer status post right colectomy, locally advanced invasive bladder cancer, hypertension and diabetes mellitus presents to hospital secondary to abnormal labs noted at [facility].  1.  Acute renal failure-on CKD stage III, baseline creatinine around 2.2. -Secondary to obstructive uropathy -Patient has highly invasive bladder cancer, had hydronephrosis with nephrostomy tubes placed percutaneously and also status post ureteral stenting. -His nephrostomy tubes were capped. -His creatinine was increasing up to 7 and so sent to the hospital. -Nephrostomy tubes have been uncapped and draining clear amber-colored urine - creatinine now at 4.7 -Nephrology consult.  No indication for dialysis.  Continue monitoring urine output at this time. -foley removed as not draining urine. - Renal ultrasound showing worsening of his hydronephrosis likely from the nephrostomy tubes being capped. -Hyponatremia is also improving - nephrostomy tubes to be exchanged on Friday  2.  Hyperkalemia- secondary to ARF  and acidosis- being treated - on lokelma. monitor  3.  Acute on chronic anemia-secondary to hematuria.  But hematuria has cleared up.   Hold Xarelto -1 unit packed RBC transfusion this admission.  4.  Acute cystitis-follow-up urine cultures.  On Rocephin  5.  Diabetes mellitus-appreciate diabetes coordinator's input - on Lantus and also sliding scale insulin for now. novolog premeal added - dose adjustments done due to hyperglycemia. Hba1c is 9.0  6.  DVT prophylaxis- start SQ heparin xarelto on hold  7 A. fib-rate controlled.  On low-dose digoxin, amiodarone.  And metoprolol.  Xarelto on hold due to anemia   Physical therapy consult requested.  Patient is from [facility] short-term rehab Updated sister over the phone    All the records are reviewed and case discussed with Care Management/Social Workerr. Management plans discussed with the patient, family and they are in agreement.  CODE STATUS: Full Code  TOTAL TIME TAKING CARE OF THIS PATIENT: 38 minutes.   POSSIBLE D/C IN 2-3 DAYS, DEPENDING ON CLINICAL CONDITION.   Porchia Sinkler M.D on 11/06/2017 at 1:37 PM  Between 7am to 6pm - Pager - [phone]  After 6pm go to [email or web address] - password [email or web address] [facility]  Office  [phone]  CC: Primary care physician; Albina Billet, MD

## 2017-11-06 NOTE — Progress Notes (Signed)
Inpatient Diabetes Program Recommendations  AACE/ADA: New Consensus Statement on Inpatient Glycemic Control (2019)  Target Ranges:  Prepandial:   less than 140 mg/dL      [place] postprandial:   less than 180 mg/dL (1-2 hours)      Critically ill patients:  140 - 180 mg/dL   Results for Jordan [name], Jordan [name] (MRN [id number]) as of 11/06/2017 09:45  Ref. Range 11/05/2017 08:05 11/05/2017 09:23 11/05/2017 12:12 11/05/2017 17:07 11/05/2017 22:08 11/06/2017 06:45 11/06/2017 07:34  Glucose-Capillary Latest Ref Range: 70 - 99 mg/dL 246 (H) 273 (H)  Novolog 5 units 254 (H)  Novolog 5 units  Lantus 12 units@14 :24 363 (H)  Novolog 9 units  399 (H)  Novolog 5 units 330 (H)  Novolog 10 units@6 :48 314 (H)  Novolog 7 units@8 :26  Lantus 12 units@8 :26  Results for Jordan [name], Jordan [name] (MRN [id number]) as of 11/06/2017 09:45  Ref. Range 09/21/2017 22:35 11/05/2017 06:11  Hemoglobin A1C Latest Ref Range: 4.8 - 5.6 % 7.2 (H) 9.0 (H)   Review of Glycemic Control  Diabetes history: DM1 Outpatient Diabetes medications: Lantus 37 units daily, Novolog 13 units TID with meals, Novolog 0-15 units QID (before meals and at HS) Current orders for Inpatient glycemic control: Lantus 25 units daily, Novolog 0-9 units TID with meals, Novolog 0-5 units QHS  Inpatient Diabetes Program Recommendations: Insulin - Basal: Noted Lantus increased to 25 units daily starting on 11/07/17. Patient has already received Lantus 12 units today. Recommend ordering one time Lantus 13 units x 1 now (for total of 25 units today). Insulin - Meal Coverage: Please consider ordering Novolog 6 units TID with meals for meal coverage if patient eats at least 50% of meals.  NOTE:   Patient from [facility] with abnormal labs.  In reviewing chart, noted patient has Type 1 DM is followed by Dr. Gabriel Carina for DM management. Patient last seen Dr. Gabriel Carina on 06/05/17 and per office note patient was prescribed Lantus 28 units QAM, Humalog 10 units with breakfast, 8 units  with lunch, 12 units with supper (4 units for snacks), and Humalog 1 unit for every 50 mg/dl above target glucose of 150 mg/dl.  Thanks, Barnie Alderman, RN, MSN, CDE Diabetes Coordinator Inpatient Diabetes Program [phone] (Team Pager from 8am to 5pm)

## 2017-11-06 NOTE — NC FL2 (Signed)
[place] LEVEL OF CARE SCREENING TOOL     IDENTIFICATION  Patient Name: Jordan [name] Birthdate: 1951/01/09 Sex: male Admission Date (Current Location): 11/04/2017  [place] and [place] Number:  Engineering geologist and Address:  [facility], [address] [place], [place] [address]      Provider Number: [id number]  Attending Physician Name and Address:  Gladstone Lighter, MD  Relative Name and Phone Number:       Current Level of Care: Hospital Recommended Level of Care: [place] Prior Approval Number:    Date Approved/Denied:   PASRR Number:    Discharge Plan: SNF    Current Diagnoses: Patient Active Problem List   Diagnosis Date Noted  . Family history of cancer   . Acute UTI 11/04/2017  . Goals of care, counseling/discussion 11/03/2017  . Anemia 11/03/2017  . Chronic anticoagulation 11/03/2017  . Thrombocytosis ([place]) 11/03/2017  . Urothelial cancer ([place]) 11/03/2017  . Urinary tract infection 10/25/2017  . Acute kidney injury ([place]) 09/21/2017  . Hyperthyroidism 03/30/2014  . Type I diabetes mellitus ([place]) 03/30/2014  . Hyperglycemia 03/30/2014  . Thyrotoxicosis 03/30/2014  . History of stroke 03/30/2014  . Malnutrition of moderate degree ([place]) 03/28/2014  . Atrial fibrillation with rapid ventricular response ([place]) 03/19/2014    Orientation RESPIRATION BLADDER Height & Weight     Self, Time, Situation, Place  Normal Continent Weight: 159 lb 11.2 oz (72.4 kg) Height:  5\' 9"  (175.3 cm)  BEHAVIORAL SYMPTOMS/MOOD NEUROLOGICAL BOWEL NUTRITION STATUS  (none) (none) Incontinent Diet(Heart Healthy )  AMBULATORY STATUS COMMUNICATION OF NEEDS Skin   Extensive Assist Verbally Normal                       Personal Care Assistance Level of Assistance  Bathing, Feeding, Dressing Bathing Assistance: Limited assistance Feeding assistance: Independent Dressing Assistance: Limited assistance      Functional Limitations Info  Sight, Hearing, Speech Sight Info: Adequate Hearing Info: Adequate Speech Info: Adequate    SPECIAL CARE FACTORS FREQUENCY  PT (By licensed PT), OT (By licensed OT)     PT Frequency: 5 OT Frequency: 5            Contractures Contractures Info: Not present    Additional Factors Info  Code Status, Allergies Code Status Info: Full Code  Allergies Info: Amiodarone           Current Medications (11/06/2017):  This is the current hospital active medication list Current Facility-Administered Medications  Medication Dose Route Frequency Provider Last Rate Last Dose  . 0.9 %  sodium chloride infusion   Intravenous Continuous Amelia Jo, MD 100 mL/hr at 11/06/17 1254    . acetaminophen (TYLENOL) tablet 650 mg  650 mg Oral Q6H PRN Amelia Jo, MD       Or  . acetaminophen (TYLENOL) suppository 650 mg  650 mg Rectal Q6H PRN Amelia Jo, MD      . albuterol (PROVENTIL) (2.5 MG/3ML) 0.083% nebulizer solution 10 mg  10 mg Nebulization Once Arta Silence, MD      . amiodarone (PACERONE) tablet 200 mg  200 mg Oral Daily Amelia Jo, MD   200 mg at 11/06/17 0834  . amLODipine (NORVASC) tablet 10 mg  10 mg Oral Daily Amelia Jo, MD   10 mg at 11/06/17 0829  . bisacodyl (DULCOLAX) EC tablet 5 mg  5 mg Oral Daily PRN Amelia Jo, MD      . cefTRIAXone (ROCEPHIN)  1 g in sodium chloride 0.9 % 100 mL IVPB  1 g Intravenous Q24H Gladstone Lighter, MD 200 mL/hr at 11/06/17 1148 1 g at 11/06/17 1148  . dextrose 50 % solution 50 mL  1 ampule Intravenous Once Arta Silence, MD      . digoxin (LANOXIN) tablet 0.0625 mg  0.0625 mg Oral Daily Amelia Jo, MD   0.0625 mg at 11/06/17 0834  . docusate sodium (COLACE) capsule 100 mg  100 mg Oral BID Amelia Jo, MD   100 mg at 11/06/17 0834  . famotidine (PEPCID) tablet 20 mg  20 mg Oral Daily Amelia Jo, MD   20 mg at 11/06/17 0834  . folic acid (FOLVITE) tablet 1 mg  1 mg Oral Daily Amelia Jo, MD   1 mg at 11/06/17 1025  . heparin injection 5,000 Units  5,000 Units Subcutaneous Q8H Gladstone Lighter, MD      . HYDROcodone-acetaminophen (NORCO/VICODIN) 5-325 MG per tablet 1-2 tablet  1-2 tablet Oral Q4H PRN Amelia Jo, MD      . insulin aspart (novoLOG) injection 0-15 Units  0-15 Units Subcutaneous TID WC Gladstone Lighter, MD   15 Units at 11/06/17 1251  . insulin aspart (novoLOG) injection 0-5 Units  0-5 Units Subcutaneous QHS Gladstone Lighter, MD   5 Units at 11/05/17 2214  . insulin aspart (novoLOG) injection 6 Units  6 Units Subcutaneous TID WC Gladstone Lighter, MD   6 Units at 11/06/17 1240  . [START ON 11/07/2017] insulin glargine (LANTUS) injection 25 Units  25 Units Subcutaneous Daily Gladstone Lighter, MD      . loratadine (CLARITIN) tablet 10 mg  10 mg Oral Daily Amelia Jo, MD   10 mg at 11/06/17 8527  . metoprolol tartrate (LOPRESSOR) tablet 25 mg  25 mg Oral BID Amelia Jo, MD   25 mg at 11/06/17 0834  . multivitamin with minerals tablet 1 tablet  1 tablet Oral Daily Amelia Jo, MD   1 tablet at 11/06/17 [phone]  . ondansetron (ZOFRAN) tablet 4 mg  4 mg Oral Q6H PRN Amelia Jo, MD       Or  . ondansetron [facility]) injection 4 mg  4 mg Intravenous Q6H PRN Amelia Jo, MD      . protein supplement (PREMIER PROTEIN) liquid - approved for s/p bariatric surgery  11 oz Oral BID BM Amelia Jo, MD   11 oz at 11/06/17 1255  . rosuvastatin (CRESTOR) tablet 5 mg  5 mg Oral Daily Amelia Jo, MD   5 mg at 11/06/17 2353  . sodium zirconium cyclosilicate (LOKELMA) packet 10 g  10 g Oral Daily Gladstone Lighter, MD   10 g at 11/06/17 1138  . tamsulosin (FLOMAX) capsule 0.4 mg  0.4 mg Oral QPC supper Amelia Jo, MD   0.4 mg at 11/05/17 1738  . traZODone (DESYREL) tablet 25 mg  25 mg Oral QHS PRN Amelia Jo, MD      . vitamin C (ASCORBIC ACID) tablet 250 mg  250 mg Oral BID Amelia Jo, MD   250 mg at 11/06/17 6144     Discharge  Medications: Please see discharge summary for a list of discharge medications.  Relevant Imaging Results:  Relevant Lab Results:   Additional Information    Baylin Cabal  Louretta Shorten, LCSWA

## 2017-11-06 NOTE — Progress Notes (Signed)
PT Cancellation Note  Patient Details Name: MURVIN GIFT MRN: [id number] DOB: [birth year]   Cancelled Treatment:    Reason Eval/Treat Not Completed: Patient at procedure or test/unavailable.  Order received.  Chart reviewed.  Pt with nursing currently.  Will re-attempt shortly when pt is available.   Roxanne Gates, PT, DPT 11/06/2017, 8:45 AM

## 2017-11-06 NOTE — Progress Notes (Signed)
RN Yasmin stated pt does not need 10mg  Albuterol at this time. It is a one time treatment from an order set per RN

## 2017-11-06 NOTE — Telephone Encounter (Signed)
App made and given to the nurse on [employer]

## 2017-11-06 NOTE — Progress Notes (Signed)
[facility]  ROUNDING NOTE   Subjective:  Good UOP of 7.1 liters.  Cr down to 4.77.  Nephrostomies working well.    Objective:  Vital signs in last 24 hours:  Temp:  [97.3 F (36.3 C)-98.4 F (36.9 C)] 98 F (36.7 C) (10/02 0823) Pulse Rate:  [71-91] 91 (10/02 0823) Resp:  [18] 18 (10/02 0823) BP: (133-159)/(60-84) 159/84 (10/02 0823) SpO2:  [97 %-100 %] 100 % (10/02 0823) Weight:  [72.4 kg] 72.4 kg (10/02 0500)  Weight change: -1.814 kg Filed Weights   11/05/17 0155 11/06/17 0500  Weight: 74.3 kg 72.4 kg    Intake/Output: I/O last 3 completed shifts: In: 4724.6 [I.V.:2098.6; Blood:276; IV Piggyback:2350] Out: 32202 [RKYHC:[id number]]   Intake/Output this shift:  No intake/output data recorded.  Physical Exam: General: No acute distress  Head: Normocephalic, atraumatic. Moist oral mucosal membranes  Eyes: Anicteric  Neck: Supple, trachea midline  Lungs:  Clear to auscultation, normal effort  Heart: S1S2 no rubs  Abdomen:  Soft, nontender, bowel sounds present, bilateral nephrostomies in palce  Extremities: trace peripheral edema.  Neurologic: Awake, alert, following commands  Skin: No lesions       Basic Metabolic Panel: Recent Labs  Lab 10/31/17 1630 11/04/17 May 08, 2005 11/05/17 0611 11/06/17 0415 11/06/17 0759  NA 129* 127* 133* 139  --   K 4.5 4.6 4.7 5.7*  --   CL 102 98 107 112*  --   CO2 18* 17* 17* 19*  --   GLUCOSE 337* 228* 232* 329* 371*  BUN 41* 70* 59* 55*  --   CREATININE 3.30* 7.89* 6.86* 4.77*  --   CALCIUM 8.6* 8.2* 8.1* 8.6*  --     Liver Function Tests: Recent Labs  Lab 10/31/17 1630 11/04/17 2007  AST 18 14*  ALT 15 12  ALKPHOS 103 99  BILITOT 0.4 0.5  PROT 7.7 6.9  ALBUMIN 2.7* 2.5*   No results for input(s): LIPASE, AMYLASE in the last 168 hours. No results for input(s): AMMONIA in the last 168 hours.  CBC: Recent Labs  Lab 10/31/17 1630 11/04/17 05-08-2005 11/05/17 0611 11/06/17 0415  WBC 10.2 12.4* 10.6 9.1   NEUTROABS 7.9* 10.2*  --   --   HGB 8.3* 7.6* 7.2* 8.8*  HCT 24.4* 22.1* 20.6* 25.2*  MCV 88.3 85.5 86.9 86.4  PLT 662* 606* 573* 659*    Cardiac Enzymes: No results for input(s): CKTOTAL, CKMB, CKMBINDEX, TROPONINI in the last 168 hours.  BNP: Invalid input(s): POCBNP  CBG: Recent Labs  Lab 11/05/17 1212 11/05/17 1707 11/05/17 05-09-06 11/06/17 0645 11/06/17 0734  GLUCAP 254* 363* 399* 330* 314*    Microbiology: Results for orders placed or performed during the hospital encounter of 11/04/17  Urine Culture     Status: Abnormal (Preliminary result)   Collection Time: 11/04/17  7:36 PM  Result Value Ref Range Status   Specimen Description   Final    URINE, RANDOM Performed at [facility], [address] [place], [place] [address]    Special Requests   Final    NONE Performed at [facility], [address], [place], [place] [address]    Culture (A)  Final    >=100,000 COLONIES/mL UNIDENTIFIED ORGANISM Performed at [facility], [place] [address], [place], [place] [address]    Report Status PENDING  Incomplete    Coagulation Studies: Recent Labs    11/04/17 May 08, 2005  LABPROT 26.7*  INR 2.49    Urinalysis: Recent Labs  11/04/17 [place] 1.014  PHURINE TEST NOT REPORTED DUE TO COLOR INTERFERENCE OF URINE PIGMENT  GLUCOSEU TEST NOT REPORTED DUE TO COLOR INTERFERENCE OF URINE PIGMENT*  HGBUR TEST NOT REPORTED DUE TO COLOR INTERFERENCE OF URINE PIGMENT*  BILIRUBINUR TEST NOT REPORTED DUE TO COLOR INTERFERENCE OF URINE PIGMENT*  KETONESUR TEST NOT REPORTED DUE TO COLOR INTERFERENCE OF URINE PIGMENT*  PROTEINUR TEST NOT REPORTED DUE TO COLOR INTERFERENCE OF URINE PIGMENT*  NITRITE TEST NOT REPORTED DUE TO COLOR INTERFERENCE OF URINE PIGMENT*  LEUKOCYTESUR TEST NOT REPORTED DUE TO COLOR INTERFERENCE OF URINE PIGMENT*      Imaging: Ct Renal Stone Study  Result Date: 11/04/2017 CLINICAL DATA:  Pt has some  confusion per ems and this is his baseline. Pt had lab work done and his creatinine was elevated and his hemoglobin has dropped. Pt has hx of GU cancer. Pt has Foley cath in place and the urine appears red. Patient underwent trans urethral resection of bladder tumor on 10/23/2017. EXAM: CT ABDOMEN AND PELVIS WITHOUT CONTRAST TECHNIQUE: Multidetector CT imaging of the abdomen and pelvis was performed following the standard protocol without IV contrast. COMPARISON:  09/22/2017 FINDINGS: Lower chest: Small bilateral pleural effusions and bibasilar atelectasis. Heart size is normal. There is coronary artery calcification. Hepatobiliary: A 5 millimeter low-attenuation lesion is identified in the RIGHT hepatic lobe, not further characterized. No radiopaque gallstones, biliary dilatation, or pericholecystic inflammatory changes. Pancreas: Unremarkable. No pancreatic ductal dilatation or surrounding inflammatory changes. Spleen: Normal in size without focal abnormality. Adrenals/Urinary Tract: Adrenal glands are normal in appearance. There is bilateral hydronephrosis. There are bilateral percutaneous nephrostomies. There are bilateral double-J ureteral stents traversing dilated ureters. A Foley catheter is in place. The degree of hydronephrosis is similar compared with previous exams. Stomach/Bowel: The stomach is normal in appearance. Small bowel loops are unremarkable. Status post ascending colectomy. Moderate stool within the transverse colon and sigmoid colon. There is thickening of the rectal wall, associated with stranding in the perirectal fat. Soft tissue density along the LEFT aspect of the rectum measures 2.1 x 5.1 centimeters and may represent inflammatory change or tumor. There is thickening along the distal portions of the ureters bilaterally, LEFT greater than RIGHT. Vascular/Lymphatic: There is atherosclerotic calcification of the abdominal aorta. No associated aneurysm. Enlarged periaortic lymph nodes are  identified, measuring up to 11 millimeters at the level of the renal veins. Reproductive: Prostate is unremarkable. Other: No abdominal wall hernia or abnormality. No abdominopelvic ascites. Musculoskeletal: Degenerative changes are identified primarily at L5-S1. Degenerative changes are seen in both hips. IMPRESSION: 1. Bilateral percutaneous nephrostomies and bilateral ureteral stents appear in good position. 2. Degree of hydronephrosis appears similar to the previous exam, prior to stent placement. 3. Foley catheter decompresses the bladder. 4. Thickening of the rectum, consistent with infectious, inflammatory change, or tumor. Soft tissue along the LEFT aspect of the rectum likely represents tumor. 5. Coronary artery calcification. 6.  Aortic atherosclerosis.  (ICD10-I70.0) 7. Status post ascending colectomy. 8. Small bilateral effusions. Electronically Signed   By: Nolon Nations M.D.   On: 11/04/2017 20:11     Medications:   . sodium chloride Stopped (11/06/17 0754)  . cefTRIAXone (ROCEPHIN)  IV Stopped (11/05/17 1050)   . albuterol  10 mg Nebulization Once  . amiodarone  200 mg Oral Daily  . amLODipine  10 mg Oral Daily  . dextrose  1 ampule Intravenous Once  . digoxin  0.0625 mg Oral Daily  . docusate sodium  100 mg Oral BID  . famotidine  20 mg Oral Daily  . folic acid  1 mg Oral Daily  . insulin aspart  0-5 Units Subcutaneous QHS  . insulin aspart  0-9 Units Subcutaneous TID WC  . [START ON 11/07/2017] insulin glargine  25 Units Subcutaneous Daily  . loratadine  10 mg Oral Daily  . metoprolol tartrate  25 mg Oral BID  . multivitamin with minerals  1 tablet Oral Daily  . protein supplement shake  11 oz Oral BID BM  . rosuvastatin  5 mg Oral Daily  . sodium zirconium cyclosilicate  10 g Oral Daily  . tamsulosin  0.4 mg Oral QPC supper  . ascorbic acid  250 mg Oral BID   acetaminophen **OR** acetaminophen, bisacodyl, HYDROcodone-acetaminophen, ondansetron **OR** ondansetron  (ZOFRAN) IV, traZODone  Assessment/ Plan:  67 y.o. 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    1.  Acute renal failure secondary to obstructive uropathy after nephrostomies capped.  2.  Hyponatremia. 3.  Metabolic acidosis secondary to hydronephrosis. 4.  Altered mental status secondary to uremia. 5.  Hyperkalemia.   Plan: Renal function improving.  Cr down to 4.77.  Good UOP from nephrostomies noted.  Serum Na also significantly improved.  K high this AM at 5.7, continue sodium zirconium 10g po daily.  Monitor renal parameters and UOP daily and await further urology input.    LOS: 2 Krissia Schreier 10/2/20199:54 AM

## 2017-11-07 ENCOUNTER — Ambulatory Visit: Payer: Medicare Other | Attending: Radiation Oncology | Admitting: Radiation Oncology

## 2017-11-07 ENCOUNTER — Other Ambulatory Visit: Payer: Medicare Other

## 2017-11-07 ENCOUNTER — Encounter: Payer: Self-pay | Admitting: Vascular Surgery

## 2017-11-07 ENCOUNTER — Encounter
Admission: EM | Disposition: A | Discharge status: Skilled Nursing Facility | Payer: Self-pay | Source: Home / Self Care | Attending: Internal Medicine

## 2017-11-07 DIAGNOSIS — C680 Malignant neoplasm of urethra: Secondary | ICD-10-CM

## 2017-11-07 HISTORY — PX: PORTA CATH INSERTION: CATH118285

## 2017-11-07 LAB — BASIC METABOLIC PANEL
ANION GAP: 10 (ref 5–15)
BUN: 44 mg/dL — ABNORMAL HIGH (ref 8–23)
CALCIUM: 8.3 mg/dL — AB (ref 8.9–10.3)
CO2: 22 mmol/L (ref 22–32)
CREATININE: 3.16 mg/dL — AB (ref 0.61–1.24)
Chloride: 110 mmol/L (ref 98–111)
GFR calc Af Amer: 22 mL/min — ABNORMAL LOW (ref >60)
GFR, EST NON AFRICAN AMERICAN: 19 mL/min — AB (ref >60)
GLUCOSE: 135 mg/dL — AB (ref 70–99)
Potassium: 3.7 mmol/L (ref 3.5–5.1)
Sodium: 142 mmol/L (ref 135–145)

## 2017-11-07 LAB — CBC
HEMATOCRIT: 24.2 % — AB (ref 40.0–52.0)
Hemoglobin: 8.5 g/dL — ABNORMAL LOW (ref 13.0–18.0)
MCH: 30.3 pg (ref 26.0–34.0)
MCHC: 35.1 g/dL (ref 32.0–36.0)
MCV: 86.4 fL (ref 80.0–100.0)
PLATELETS: 628 10*3/uL — AB (ref 150–440)
RBC: 2.81 MIL/uL — ABNORMAL LOW (ref 4.40–5.90)
RDW: 14.5 % (ref 11.5–14.5)
WBC: 9.5 10*3/uL (ref 3.8–10.6)

## 2017-11-07 LAB — URINE CULTURE

## 2017-11-07 LAB — SURGICAL PCR SCREEN
MRSA, PCR: NEGATIVE
Staphylococcus aureus: NEGATIVE

## 2017-11-07 LAB — MRSA PCR SCREENING: MRSA by PCR: NEGATIVE

## 2017-11-07 LAB — GLUCOSE, CAPILLARY
GLUCOSE-CAPILLARY: 137 mg/dL — AB (ref 70–99)
GLUCOSE-CAPILLARY: 218 mg/dL — AB (ref 70–99)
Glucose-Capillary: 152 mg/dL — ABNORMAL HIGH (ref 70–99)
Glucose-Capillary: 130 mg/dL — ABNORMAL HIGH (ref 70–99)
Glucose-Capillary: 103 mg/dL — ABNORMAL HIGH (ref 70–99)

## 2017-11-07 SURGERY — PORTA CATH INSERTION
Anesthesia: Moderate Sedation

## 2017-11-07 MED ORDER — CEPHALEXIN 250 MG PO CAPS
250.0000 mg | ORAL_CAPSULE | Freq: Two times a day (BID) | ORAL | Status: DC
  Administered 2017-11-07: 22:00:00 250 mg via ORAL
  Filled 2017-11-07 (×2): qty 1

## 2017-11-07 MED ORDER — CEFAZOLIN SODIUM-DEXTROSE 2-4 GM/100ML-% IV SOLN
2.0000 g | Freq: Once | INTRAVENOUS | Status: AC
  Administered 2017-11-07: 2 g via INTRAVENOUS

## 2017-11-07 MED ORDER — LIDOCAINE-EPINEPHRINE (PF) 1 %-1:200000 IJ SOLN
INTRAMUSCULAR | Status: AC
  Filled 2017-11-07: qty 30

## 2017-11-07 MED ORDER — LIDOCAINE-PRILOCAINE 2.5-2.5 % EX CREA
TOPICAL_CREAM | Freq: Every day | CUTANEOUS | Status: DC | PRN
  Filled 2017-11-07: qty 5

## 2017-11-07 MED ORDER — CHLORHEXIDINE GLUCONATE CLOTH 2 % EX PADS
6.0000 | MEDICATED_PAD | Freq: Once | CUTANEOUS | Status: AC
  Administered 2017-11-07: 6 via TOPICAL

## 2017-11-07 MED ORDER — FENTANYL CITRATE (PF) 100 MCG/2ML IJ SOLN
INTRAMUSCULAR | Status: AC
  Filled 2017-11-07: qty 2

## 2017-11-07 MED ORDER — CEFAZOLIN SODIUM-DEXTROSE 2-4 GM/100ML-% IV SOLN
INTRAVENOUS | Status: AC
  Filled 2017-11-07: qty 100

## 2017-11-07 MED ORDER — LINEZOLID 600 MG PO TABS
600.0000 mg | ORAL_TABLET | Freq: Two times a day (BID) | ORAL | Status: DC
  Administered 2017-11-07 – 2017-11-11 (×8): 600 mg via ORAL
  Filled 2017-11-07 (×10): qty 1

## 2017-11-07 MED ORDER — FENTANYL CITRATE (PF) 100 MCG/2ML IJ SOLN
INTRAMUSCULAR | Status: DC | PRN
  Administered 2017-11-07: 50 ug via INTRAVENOUS
  Administered 2017-11-07: 25 ug via INTRAVENOUS

## 2017-11-07 MED ORDER — SODIUM CHLORIDE 0.9 % IV SOLN: INTRAVENOUS | Status: DC

## 2017-11-07 MED ORDER — MIDAZOLAM HCL 5 MG/5ML IJ SOLN
INTRAMUSCULAR | Status: AC
  Filled 2017-11-07: qty 5

## 2017-11-07 MED ORDER — MIDAZOLAM HCL 2 MG/2ML IJ SOLN
INTRAMUSCULAR | Status: DC | PRN
  Administered 2017-11-07 (×2): 2 mg via INTRAVENOUS

## 2017-11-07 MED ORDER — HEPARIN (PORCINE) IN NACL 1000-0.9 UT/500ML-% IV SOLN
INTRAVENOUS | Status: AC
  Filled 2017-11-07: qty 500

## 2017-11-07 SURGICAL SUPPLY — 8 items
KIT PORT POWER 8FR ISP CVUE (Port) ×3 IMPLANT
PACK ANGIOGRAPHY (CUSTOM PROCEDURE TRAY) ×3 IMPLANT
PAD GROUND ADULT SPLIT (MISCELLANEOUS) ×3 IMPLANT
PENCIL ELECTRO HAND CTR (MISCELLANEOUS) ×3 IMPLANT
SUT MNCRL AB 4-0 PS2 18 (SUTURE) ×3 IMPLANT
SUT PROLENE 0 CT 1 30 (SUTURE) ×3 IMPLANT
SUT VIC AB 3-0 SH 27 (SUTURE) ×2
SUT VIC AB 3-0 SH 27X BRD (SUTURE) ×1 IMPLANT

## 2017-11-07 NOTE — Progress Notes (Signed)
PT Cancellation Note  Patient Details Name: Jordan [name] MRN: [id number] DOB: 03/09/1950   Cancelled Treatment:    Reason Eval/Treat Not Completed: Patient at procedure or test/unavailable.  Pt currently off floor for procedure.  Will re-attempt PT treatment at a later date/time.  Leitha Bleak, PT 11/07/17, 2:31 PM [phone]

## 2017-11-07 NOTE — Op Note (Addendum)
      [facility]       Operative Note  Date: 11/07/2017  Preoperative diagnosis:  1. Urethral cancer  Postoperative diagnosis:  Same as above  Procedures: #1. Ultrasound guidance for vascular access to the right internal jugular vein. #2. Fluoroscopic guidance for placement of catheter. #3. Placement of CT compatible Port-A-Cath, right internal jugular vein.  Surgeon: Leotis Pain, MD.   Assistant: Hezzie Bump, PA-C  Anesthesia: Local with moderate conscious sedation for approximately 20  minutes using 4 mg of Versed and 75 mcg of Fentanyl  Fluoroscopy time: less than 1 minute  Contrast used: 0  Estimated blood loss: 5 cc  Indication for the procedure:  The patient is a 67 y.o.male with urethral cancer.  The patient needs a Port-A-Cath for durable venous access, chemotherapy, lab draws, and CT scans. We are asked to place this. Risks and benefits were discussed and informed consent was obtained. An assistant was present during the procedure to help facilitate the exposure and expedite the procedure as well as assist with closure.  Description of procedure: The patient was brought to the vascular and interventional radiology suite.  Moderate conscious sedation was administered throughout the procedure during a face to face encounter with the patient with my supervision of the RN administering medicines and monitoring the patient's vital signs, pulse oximetry, telemetry and mental status throughout from the start of the procedure until the patient was taken to the recovery room. The assistant provided retraction and mobilization to help facilitate exposure and expedite the procedure throughout the entire procedure.  This included following suture, using retractors, and optimizing lighting as well as assist with closure. The right neck chest and shoulder were sterilely prepped and draped, and a sterile surgical field was created. Ultrasound was used to help visualize a  patent right internal jugular vein. This was then accessed under direct ultrasound guidance without difficulty with the Seldinger needle and a permanent image was recorded. A J-wire was placed. After skin nick and dilatation, the peel-away sheath was then placed over the wire. I then anesthetized an area under the clavicle approximately 1-2 fingerbreadths. A transverse incision was created and an inferior pocket was created with electrocautery and blunt dissection. The port was then brought onto the field, placed into the pocket and secured to the chest wall with 2 Prolene sutures. The catheter was connected to the port and tunneled from the subclavicular incision to the access site. Fluoroscopic guidance was then used to cut the catheter to an appropriate length. The catheter was then placed through the peel-away sheath and the peel-away sheath was removed. The catheter tip was parked in excellent location under fluorocoscopic guidance in the cavoatrial junction. The pocket was then irrigated with antibiotic impregnated saline and the wound was closed with a running 3-0 Vicryl and a 4-0 Monocryl. The access incision was closed with a single 4-0 Monocryl. The Huber needle was used to withdraw blood and flush the port with heparinized saline. Dermabond was then placed as a dressing. The patient tolerated the procedure well and was taken to the recovery room in stable condition.   Leotis Pain 11/07/2017 2:49 PM   This note was created with Dragon Medical transcription system. Any errors in dictation are purely unintentional.

## 2017-11-07 NOTE — Progress Notes (Signed)
[facility]  ROUNDING NOTE   Subjective:  Patient continues to have good UOP. UOP was 4.2 liters over the preceding 24 hours.  Resting comfortably.    Objective:  Vital signs in last 24 hours:  Temp:  [98.2 F (36.8 C)-98.5 F (36.9 C)] 98.4 F (36.9 C) (10/03 1400) Pulse Rate:  [67-78] 78 (10/03 1201) Resp:  [15-20] 15 (10/03 1400) BP: (135-165)/(67-82) 165/82 (10/03 1400) SpO2:  [97 %-100 %] 98 % (10/03 1400)  Weight change:  Filed Weights   11/05/17 0155 11/06/17 0500  Weight: 74.3 kg 72.4 kg    Intake/Output: I/O last 3 completed shifts: In: 2999.6 [I.V.:2618.6; Blood:276; IV Piggyback:105] Out: 7085 [Urine:7085]   Intake/Output this shift:  Total I/O In: -  Out: 1050 [Urine:1050]  Physical Exam: General: No acute distress  Head: Normocephalic, atraumatic. Moist oral mucosal membranes  Eyes: Anicteric  Neck: Supple, trachea midline  Lungs:  Clear to auscultation, normal effort  Heart: S1S2 no rubs  Abdomen:  Soft, nontender, bowel sounds present, bilateral nephrostomies in palce  Extremities: trace peripheral edema.  Neurologic: Awake, alert, following commands  Skin: No lesions       Basic Metabolic Panel: Recent Labs  Lab 10/31/17 1630 11/04/17 2007 11/05/17 0611 11/06/17 0415 11/06/17 0759 11/06/17 1412 11/07/17 0326  NA 129* 127* 133* 139  --   --  142  K 4.5 4.6 4.7 5.7*  --  4.3 3.7  CL 102 98 107 112*  --   --  110  CO2 18* 17* 17* 19*  --   --  22  GLUCOSE 337* 228* 232* 329* 371*  --  135*  BUN 41* 70* 59* 55*  --   --  44*  CREATININE 3.30* 7.89* 6.86* 4.77*  --   --  3.16*  CALCIUM 8.6* 8.2* 8.1* 8.6*  --   --  8.3*    Liver Function Tests: Recent Labs  Lab 10/31/17 1630 11/04/17 2007  AST 18 14*  ALT 15 12  ALKPHOS 103 99  BILITOT 0.4 0.5  PROT 7.7 6.9  ALBUMIN 2.7* 2.5*   No results for input(s): LIPASE, AMYLASE in the last 168 hours. No results for input(s): AMMONIA in the last 168  hours.  CBC: Recent Labs  Lab 10/31/17 1630 11/04/17 2007 11/05/17 0611 11/06/17 0415 11/07/17 0326  WBC 10.2 12.4* 10.6 9.1 9.5  NEUTROABS 7.9* 10.2*  --   --   --   HGB 8.3* 7.6* 7.2* 8.8* 8.5*  HCT 24.4* 22.1* 20.6* 25.2* 24.2*  MCV 88.3 85.5 86.9 86.4 86.4  PLT 662* 606* 573* 659* 628*    Cardiac Enzymes: No results for input(s): CKTOTAL, CKMB, CKMBINDEX, TROPONINI in the last 168 hours.  BNP: Invalid input(s): POCBNP  CBG: Recent Labs  Lab 11/06/17 1714 11/06/17 2148 11/07/17 0737 11/07/17 1159 11/07/17 1405  GLUCAP 300* 157* 152* 130* 103*    Microbiology: Results for orders placed or performed during the hospital encounter of 11/04/17  Urine Culture     Status: Abnormal   Collection Time: 11/04/17  7:36 PM  Result Value Ref Range Status   Specimen Description   Final    URINE, RANDOM Performed at [facility], [address], [place], [place] [address]    Special Requests   Final    NONE Performed at [facility], [address] [place], [place] [address]    Culture (A)  Final    >=100,000 COLONIES/mL ESCHERICHIA COLI >=100,000 COLONIES/mL VANCOMYCIN RESISTANT ENTEROCOCCUS  ISOLATED    Report Status 11/07/2017 FINAL  Final   Organism ID, Bacteria ESCHERICHIA COLI (A)  Final   Organism ID, Bacteria VANCOMYCIN RESISTANT ENTEROCOCCUS ISOLATED (A)  Final      Susceptibility   Escherichia coli - MIC*    AMPICILLIN >=32 RESISTANT Resistant     CEFAZOLIN <=4 SENSITIVE Sensitive     CEFTRIAXONE <=1 SENSITIVE Sensitive     CIPROFLOXACIN <=0.25 SENSITIVE Sensitive     GENTAMICIN <=1 SENSITIVE Sensitive     IMIPENEM <=0.25 SENSITIVE Sensitive     NITROFURANTOIN <=16 SENSITIVE Sensitive     TRIMETH/SULFA >=320 RESISTANT Resistant     AMPICILLIN/SULBACTAM 16 INTERMEDIATE Intermediate     PIP/TAZO <=4 SENSITIVE Sensitive     Extended ESBL NEGATIVE Sensitive     * >=100,000 COLONIES/mL ESCHERICHIA COLI   Vancomycin resistant  enterococcus isolated - MIC*    AMPICILLIN >=32 RESISTANT Resistant     LEVOFLOXACIN >=8 RESISTANT Resistant     NITROFURANTOIN 256 RESISTANT Resistant     VANCOMYCIN >=32 RESISTANT Resistant     LINEZOLID 2 SENSITIVE Sensitive     * >=100,000 COLONIES/mL VANCOMYCIN RESISTANT ENTEROCOCCUS ISOLATED  MRSA PCR Screening     Status: None   Collection Time: 11/06/17 10:47 PM  Result Value Ref Range Status   MRSA by PCR NEGATIVE NEGATIVE Final    Comment:        The GeneXpert MRSA Assay (FDA approved for NASAL specimens only), is one component of a comprehensive MRSA colonization surveillance program. It is not intended to diagnose MRSA infection nor to guide or monitor treatment for MRSA infections. Performed at [facility], [address] [place], [place] [address]   Surgical PCR screen     Status: None   Collection Time: 11/06/17 10:47 PM  Result Value Ref Range Status   MRSA, PCR NEGATIVE NEGATIVE Final   Staphylococcus aureus NEGATIVE NEGATIVE Final    Comment: (NOTE) The Xpert SA Assay (FDA approved for NASAL specimens in patients 61 years of age and older), is one component of a comprehensive surveillance program. It is not intended to diagnose infection nor to guide or monitor treatment. Performed at [facility], [address] [place], [place] [address]     Coagulation Studies: Recent Labs    11/04/17 May 19, 2005  LABPROT 26.7*  INR 2.49    Urinalysis: Recent Labs    11/04/17 1936  COLORURINE AMBER*  LABSPEC 1.014  PHURINE TEST NOT REPORTED DUE TO COLOR INTERFERENCE OF URINE PIGMENT  GLUCOSEU TEST NOT REPORTED DUE TO COLOR INTERFERENCE OF URINE PIGMENT*  HGBUR TEST NOT REPORTED DUE TO COLOR INTERFERENCE OF URINE PIGMENT*  BILIRUBINUR TEST NOT REPORTED DUE TO COLOR INTERFERENCE OF URINE PIGMENT*  KETONESUR TEST NOT REPORTED DUE TO COLOR INTERFERENCE OF URINE PIGMENT*  PROTEINUR TEST NOT REPORTED DUE TO COLOR INTERFERENCE OF URINE PIGMENT*   NITRITE TEST NOT REPORTED DUE TO COLOR INTERFERENCE OF URINE PIGMENT*  LEUKOCYTESUR TEST NOT REPORTED DUE TO COLOR INTERFERENCE OF URINE PIGMENT*      Imaging: No results found.   Medications:   . sodium chloride 100 mL/hr at 11/07/17 0939  . sodium chloride    .  ceFAZolin (ANCEF) IV 2 g (11/07/17 1410)   . [MAR Hold] albuterol  10 mg Nebulization Once  . [MAR Hold] amiodarone  200 mg Oral Daily  . [MAR Hold] amLODipine  10 mg Oral Daily  . cephALEXin  250 mg Oral Q12H  . [MAR Hold] dextrose  1  ampule Intravenous Once  . [MAR Hold] digoxin  0.0625 mg Oral Daily  . [MAR Hold] docusate sodium  100 mg Oral BID  . [MAR Hold] famotidine  20 mg Oral Daily  . [MAR Hold] folic acid  1 mg Oral Daily  . [MAR Hold] heparin injection (subcutaneous)  5,000 Units Subcutaneous Q8H  . [MAR Hold] insulin aspart  0-15 Units Subcutaneous TID WC  . [MAR Hold] insulin aspart  0-5 Units Subcutaneous QHS  . [MAR Hold] insulin aspart  6 Units Subcutaneous TID WC  . [MAR Hold] insulin glargine  25 Units Subcutaneous Daily  . [MAR Hold] linezolid  600 mg Oral Q12H  . [MAR Hold] loratadine  10 mg Oral Daily  . [MAR Hold] metoprolol tartrate  25 mg Oral BID  . [MAR Hold] multivitamin with minerals  1 tablet Oral Daily  . [MAR Hold] protein supplement shake  11 oz Oral BID BM  . [MAR Hold] rosuvastatin  5 mg Oral Daily  . [MAR Hold] tamsulosin  0.4 mg Oral QPC supper  . [MAR Hold] ascorbic acid  250 mg Oral BID   [MAR Hold] acetaminophen **OR** [MAR Hold] acetaminophen, [MAR Hold] bisacodyl, fentaNYL, [MAR Hold] HYDROcodone-acetaminophen, [MAR Hold] lidocaine-prilocaine, midazolam, [MAR Hold] ondansetron **OR** [MAR Hold] ondansetron (ZOFRAN) IV, [MAR Hold] traZODone  Assessment/ Plan:  67 y.o. 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    1.  Acute renal failure secondary to obstructive uropathy after nephrostomies capped.  2.  Hyponatremia. 3.  Metabolic acidosis secondary to hydronephrosis. 4.  Altered mental status secondary to uremia. 5.  Hyperkalemia.   Plan: Kidney function continues to improve slowly. Creatinine currently 3.16 and urine output was 4.2 L over the preceding 24 hours.  No indication for dialysis at the moment.  Serum sodium also corrected to 142.  Continue to monitor serum electrolytes closely.  We will continue to monitor the patient's progress closely.   LOS: 3 Jordan [name] 10/3/20192:30 PM

## 2017-11-07 NOTE — Progress Notes (Signed)
[place] [facility] [place] NAME: Jordan [name]    MR#:  [id number]  DATE OF BIRTH:  [birth year]  SUBJECTIVE:  CHIEF COMPLAINT:   Chief Complaint  Patient presents with  . Abnormal Lab   - creatinine improving, hb improved after blood transfusion yesterday - foley removed, nephrostomies draining well  REVIEW OF SYSTEMS:  Review of Systems  Constitutional: Positive for malaise/fatigue. Negative for chills and fever.  HENT: Negative for congestion, ear discharge, hearing loss and nosebleeds.   Respiratory: Negative for cough, shortness of breath and wheezing.   Cardiovascular: Negative for chest pain, palpitations and leg swelling.  Gastrointestinal: Negative for abdominal pain, constipation, diarrhea, nausea and vomiting.  Genitourinary: Negative for dysuria.  Musculoskeletal: Negative for myalgias.  Neurological: Negative for dizziness, focal weakness, seizures, weakness and headaches.  Psychiatric/Behavioral: Negative for depression.    DRUG ALLERGIES:   Allergies  Allergen Reactions  . Amiodarone Other (See Comments)    Thyrotoxicosis per Murray Hodgkins, NP    VITALS:  Blood pressure (!) 150/80, pulse 78, temperature 98.3 F (36.8 C), temperature source Oral, resp. rate 16, height 5\' 9"  (1.753 m), weight 72.4 kg, SpO2 99 %.  PHYSICAL EXAMINATION:  Physical Exam   GENERAL:  67 y.o.-year-old patient lying in the bed with no acute distress.  EYES: Pupils equal, round, reactive to light and accommodation. No scleral icterus. Extraocular muscles intact.  HEENT: Head atraumatic, normocephalic. Oropharynx and nasopharynx clear.  NECK:  Supple, no jugular venous distention. No thyroid enlargement, no tenderness.  LUNGS: Normal breath sounds bilaterally, no wheezing, rales,rhonchi or crepitation. No use of accessory muscles of respiration. Decreased bibasilar breath sounds CARDIOVASCULAR: S1, S2 normal. No murmurs, rubs, or gallops.    ABDOMEN: Soft, nontender, nondistended. Bowel sounds present. No organomegaly or mass.  - bilateral nephrostomy tubes in place EXTREMITIES: No pedal edema, cyanosis, or clubbing.  NEUROLOGIC: Cranial nerves II through XII are intact. Muscle strength 5/5 in all extremities. Sensation intact. Gait not checked. Global weakness noted. PSYCHIATRIC: The patient is alert and oriented to self  SKIN: No obvious rash, lesion, or ulcer.    LABORATORY PANEL:   CBC Recent Labs  Lab 11/07/17 0326  WBC 9.5  HGB 8.5*  HCT 24.2*  PLT 628*   ------------------------------------------------------------------------------------------------------------------  Chemistries  Recent Labs  Lab 11/04/17 2007  11/07/17 0326  NA 127*   < > 142  K 4.6   < > 3.7  CL 98   < > 110  CO2 17*   < > 22  GLUCOSE 228*   < > 135*  BUN 70*   < > 44*  CREATININE 7.89*   < > 3.16*  CALCIUM 8.2*   < > 8.3*  AST 14*  --   --   ALT 12  --   --   ALKPHOS 99  --   --   BILITOT 0.5  --   --    < > = values in this interval not displayed.   ------------------------------------------------------------------------------------------------------------------  Cardiac Enzymes No results for input(s): TROPONINI in the last 168 hours. ------------------------------------------------------------------------------------------------------------------  RADIOLOGY:  No results found.  EKG:   Orders placed or performed during the hospital encounter of 10/25/17  . ED EKG within 10 minutes  . ED EKG within 10 minutes  . EKG    ASSESSMENT AND PLAN:   67 year old male with past medical history significant for A. fib on Xarelto, history of colon cancer status post right colectomy, locally  advanced invasive bladder cancer, hypertension and diabetes mellitus presents to hospital secondary to abnormal labs noted at [facility].  1.  Acute renal failure-on CKD stage III, baseline creatinine around 2.2. -Secondary to  obstructive uropathy -Patient has highly invasive bladder cancer, had hydronephrosis with nephrostomy tubes placed percutaneously and also status post ureteral stenting. -His nephrostomy tubes were capped. -His creatinine was increasing up to 7 and so sent to the hospital. -Nephrostomy tubes have been uncapped and draining clear amber-colored urine - creatinine now at 3.1 -Nephrology consult.  No indication for dialysis.  Continue monitoring urine output at this time. -foley removed as not draining urine. - Renal ultrasound showing worsening of his hydronephrosis likely from the nephrostomy tubes being capped. -Hyponatremia is also improving - nephrostomy tubes to be exchanged tomorrow -Port-A-Cath being placed today  2.  Hyperkalemia- secondary to ARF and acidosis- treated With  lokelma.  Discontinued as potassium is within normal limits  3.  Acute on chronic anemia-secondary to hematuria.  But hematuria has cleared up.  Hold Xarelto -1 unit packed RBC transfusion this admission.  4.  Acute cystitis- cultures growing VRE and E.coli -Patient is on Rocephin and Zyvox.  Treat for 7 days and stop  5.  Diabetes mellitus-appreciate diabetes coordinator's input - on Lantus and also sliding scale insulin for now. novolog premeal added - dose adjustments done due to hyperglycemia. Hba1c is 9.0  6.  DVT prophylaxis- SQ heparin xarelto on hold  7 A. fib-rate controlled.  On low-dose digoxin, amiodarone.  And metoprolol.  Xarelto on hold due to anemia   Physical therapy consult requested.  Patient is from [facility] short-term rehab Updated sister over the phone today    All the records are reviewed and case discussed with Care Management/Social Workerr. Management plans discussed with the patient, family and they are in agreement.  CODE STATUS: Full Code  TOTAL TIME TAKING CARE OF THIS PATIENT: 38 minutes.   POSSIBLE D/C TOMORROW, DEPENDING ON CLINICAL  CONDITION.   Gerrie Castiglia M.D on 11/07/2017 at 1:24 PM  Between 7am to 6pm - Pager - [phone]  After 6pm go to [email or web address] - password [email or web address] [facility]  Office  [phone]  CC: Primary care physician; Albina Billet, MD

## 2017-11-07 NOTE — Care Management Important Message (Signed)
Important Message  Patient Details  Name: GILFORD LARDIZABAL MRN: [id number] Date of Birth: [birth year]   Medicare Important Message Given:  Yes    Shelbie Ammons, RN 11/07/2017, 12:05 PM

## 2017-11-07 NOTE — H&P (Signed)
[facility] History & Physical Update  The patient was interviewed and re-examined.  The patient's previous History and Physical has been reviewed and is unchanged.  There is no change in the plan of care. We plan to proceed with the scheduled procedure.  Leotis Pain, MD  11/07/2017, 2:01 PM

## 2017-11-08 ENCOUNTER — Other Ambulatory Visit: Payer: Self-pay | Admitting: *Deleted

## 2017-11-08 ENCOUNTER — Inpatient Hospital Stay: Payer: Medicare Other | Admitting: Nurse Practitioner

## 2017-11-08 ENCOUNTER — Inpatient Hospital Stay: Payer: Medicare Other

## 2017-11-08 ENCOUNTER — Encounter: Payer: Self-pay | Admitting: Interventional Radiology

## 2017-11-08 HISTORY — PX: IR NEPHROSTOMY EXCHANGE LEFT: IMG6069

## 2017-11-08 HISTORY — PX: IR NEPHROSTOMY EXCHANGE RIGHT: IMG6070

## 2017-11-08 LAB — TYPE AND SCREEN
ABO/RH(D): A NEG
ANTIBODY SCREEN: NEGATIVE
UNIT DIVISION: 0
UNIT DIVISION: 0

## 2017-11-08 LAB — BPAM RBC
BLOOD PRODUCT EXPIRATION DATE: 201910222359
Blood Product Expiration Date: 201910222359
ISSUE DATE / TIME: 201910011602
UNIT TYPE AND RH: 600
Unit Type and Rh: 600

## 2017-11-08 LAB — GLUCOSE, CAPILLARY
GLUCOSE-CAPILLARY: 402 mg/dL — AB (ref 70–99)
GLUCOSE-CAPILLARY: 271 mg/dL — AB (ref 70–99)
Glucose-Capillary: 171 mg/dL — ABNORMAL HIGH (ref 70–99)
Glucose-Capillary: 395 mg/dL — ABNORMAL HIGH (ref 70–99)

## 2017-11-08 LAB — BASIC METABOLIC PANEL
Anion gap: 8 (ref 5–15)
BUN: 27 mg/dL — ABNORMAL HIGH (ref 8–23)
CHLORIDE: 112 mmol/L — AB (ref 98–111)
CO2: 24 mmol/L (ref 22–32)
CREATININE: 2.61 mg/dL — AB (ref 0.61–1.24)
Calcium: 8.4 mg/dL — ABNORMAL LOW (ref 8.9–10.3)
GFR calc non Af Amer: 24 mL/min — ABNORMAL LOW (ref >60)
GFR, EST AFRICAN AMERICAN: 28 mL/min — AB (ref >60)
Glucose, Bld: 172 mg/dL — ABNORMAL HIGH (ref 70–99)
Potassium: 4.4 mmol/L (ref 3.5–5.1)
Sodium: 144 mmol/L (ref 135–145)

## 2017-11-08 MED ORDER — LIDOCAINE HCL (PF) 1 % IJ SOLN
INTRAMUSCULAR | Status: AC
  Filled 2017-11-08: qty 30

## 2017-11-08 MED ORDER — CEPHALEXIN 250 MG PO CAPS: 250.0000 mg | ORAL_CAPSULE | Freq: Three times a day (TID) | ORAL | 0 refills | Status: AC

## 2017-11-08 MED ORDER — CEPHALEXIN 250 MG PO CAPS
250.0000 mg | ORAL_CAPSULE | Freq: Three times a day (TID) | ORAL | Status: DC
  Administered 2017-11-08 – 2017-11-11 (×10): 250 mg via ORAL
  Filled 2017-11-08 (×11): qty 1

## 2017-11-08 MED ORDER — IOPAMIDOL (ISOVUE-300) INJECTION 61%
10.0000 mL | Freq: Once | INTRAVENOUS | Status: AC | PRN
  Administered 2017-11-08: 09:00:00 10 mL via INTRAVENOUS

## 2017-11-08 MED ORDER — LIDOCAINE-PRILOCAINE 2.5-2.5 % EX CREA: TOPICAL_CREAM | CUTANEOUS | 3 refills | Status: DC

## 2017-11-08 MED ORDER — LINEZOLID 600 MG PO TABS: 600.0000 mg | ORAL_TABLET | Freq: Two times a day (BID) | ORAL | 0 refills | Status: AC

## 2017-11-08 MED ORDER — HYDROCODONE-ACETAMINOPHEN 5-325 MG PO TABS: 1.0000 | ORAL_TABLET | Freq: Four times a day (QID) | ORAL | 0 refills | Status: AC | PRN

## 2017-11-08 NOTE — Progress Notes (Signed)
[place] at [place] NAME: Jordan [name]    MR#:  [id number]  DATE OF BIRTH:  Aug 02, 1950  SUBJECTIVE:  CHIEF COMPLAINT:   Chief Complaint  Patient presents with  . Abnormal Lab   -Doing well.  Nephrostomy tubes exchanged. -Awaiting to go back to [facility] if bed available  REVIEW OF SYSTEMS:  Review of Systems  Constitutional: Positive for malaise/fatigue. Negative for chills and fever.  HENT: Negative for congestion, ear discharge, hearing loss and nosebleeds.   Respiratory: Negative for cough, shortness of breath and wheezing.   Cardiovascular: Negative for chest pain, palpitations and leg swelling.  Gastrointestinal: Negative for abdominal pain, constipation, diarrhea, nausea and vomiting.  Genitourinary: Negative for dysuria.  Musculoskeletal: Negative for myalgias.  Neurological: Negative for dizziness, focal weakness, seizures, weakness and headaches.  Psychiatric/Behavioral: Negative for depression.    DRUG ALLERGIES:   Allergies  Allergen Reactions  . Amiodarone Other (See Comments)    Thyrotoxicosis per Murray Hodgkins, NP    VITALS:  Blood pressure (!) 155/80, pulse 78, temperature 98.4 F (36.9 C), temperature source Oral, resp. rate 15, height 5\' 9"  (1.753 m), weight 72.3 kg, SpO2 97 %.  PHYSICAL EXAMINATION:  Physical Exam   GENERAL:  67 y.o.-year-old patient lying in the bed with no acute distress.  EYES: Pupils equal, round, reactive to light and accommodation. No scleral icterus. Extraocular muscles intact.  HEENT: Head atraumatic, normocephalic. Oropharynx and nasopharynx clear.  NECK:  Supple, no jugular venous distention. No thyroid enlargement, no tenderness.  LUNGS: Normal breath sounds bilaterally, no wheezing, rales,rhonchi or crepitation. No use of accessory muscles of respiration. Decreased bibasilar breath sounds CARDIOVASCULAR: S1, S2 normal. No murmurs, rubs, or gallops.  ABDOMEN: Soft,  nontender, nondistended. Bowel sounds present. No organomegaly or mass.  - bilateral nephrostomy tubes in place EXTREMITIES: No pedal edema, cyanosis, or clubbing.  NEUROLOGIC: Cranial nerves II through XII are intact. Muscle strength 5/5 in all extremities. Sensation intact. Gait not checked. Global weakness noted. PSYCHIATRIC: The patient is alert and oriented to self  SKIN: No obvious rash, lesion, or ulcer.    LABORATORY PANEL:   CBC Recent Labs  Lab 11/07/17 0326  WBC 9.5  HGB 8.5*  HCT 24.2*  PLT 628*   ------------------------------------------------------------------------------------------------------------------  Chemistries  Recent Labs  Lab 11/04/17 2007  11/08/17 0518  NA 127*   < > 144  K 4.6   < > 4.4  CL 98   < > 112*  CO2 17*   < > 24  GLUCOSE 228*   < > 172*  BUN 70*   < > 27*  CREATININE 7.89*   < > 2.61*  CALCIUM 8.2*   < > 8.4*  AST 14*  --   --   ALT 12  --   --   ALKPHOS 99  --   --   BILITOT 0.5  --   --    < > = values in this interval not displayed.   ------------------------------------------------------------------------------------------------------------------  Cardiac Enzymes No results for input(s): TROPONINI in the last 168 hours. ------------------------------------------------------------------------------------------------------------------  RADIOLOGY:  Ir Nephrostomy Exchange Left  Result Date: 11/08/2017 CLINICAL DATA:  Urethral cancer. Bilateral indwelling percutaneous nephrostomy catheters, presents for routine exchange. No current issues. EXAM: BILATERAL PERCUTANEOUS NEPHROSTOMY CATHETER EXCHANGE UNDER FLUOROSCOPY FLUOROSCOPY TIME:  1.9 minute; 12 mGy TECHNIQUE: The nephrostomy tubes and surrounding skin were prepped with Betadine, draped in usual sterile fashion. A small amount of contrast was injected  through the right nephrostomy catheter to opacify the renal collecting system. The catheter was in a peripheral calyx. The  catheter was cut and exchanged over a 0.035" angiographic wire. A Kumpe catheter was required to regain access to the central aspect of the renal collecting system. Kumpe was then exchanged for a new 10-French pigtail catheter, formed centrally within the collecting system under fluoroscopy. Contrast injection confirms appropriate positioning. In a similar fashion, the left nephrostomy catheter was injected, cut, and exchanged for a new 10-French pigtail catheter, formed centrally within the left renal collecting system. Injection confirms appropriate positioning and patency. Both catheters were secured externally with a Statlock devices and additional 0-Prolene sutures. The patient tolerated the procedure well. COMPLICATIONS: None immediate IMPRESSION: 1. Technically successful exchange of bilateral nephrostomy catheters under fluoroscopy Electronically Signed   By: Lucrezia Europe M.D.   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 No current issues. EXAM: BILATERAL PERCUTANEOUS NEPHROSTOMY CATHETER EXCHANGE UNDER FLUOROSCOPY FLUOROSCOPY TIME:  1.9 minute; 12 mGy TECHNIQUE: The nephrostomy tubes and surrounding skin were prepped with Betadine, draped in usual sterile fashion. A small amount of contrast was injected through the right nephrostomy catheter to opacify the renal collecting system. The catheter was in a peripheral calyx. The catheter was cut and exchanged over a 0.035" angiographic wire. A Kumpe catheter was required to regain access to the central aspect of the renal collecting system. Kumpe was then exchanged for a new 10-French pigtail catheter, formed centrally within the collecting system under fluoroscopy. Contrast injection confirms appropriate positioning. In a similar fashion, the left nephrostomy catheter was injected, cut, and exchanged for a  new 10-French pigtail catheter, formed centrally within the left renal collecting system. Injection confirms appropriate positioning and patency. Both catheters were secured externally with a Statlock devices and additional 0-Prolene sutures. The patient tolerated the procedure well. COMPLICATIONS: None immediate IMPRESSION: 1. Technically successful exchange of bilateral nephrostomy catheters under fluoroscopy Electronically Signed   By: Lucrezia Europe M.D.   On: 11/08/2017 09:34    EKG:   Orders placed or performed during the hospital encounter of 10/25/17  . ED EKG within 10 minutes  . ED EKG within 10 minutes  . EKG    ASSESSMENT AND PLAN:   67 year old male with past medical history significant for A. fib on Xarelto, history of colon cancer status post right colectomy, locally advanced invasive bladder cancer, hypertension and diabetes mellitus presents to hospital secondary to abnormal labs noted at [facility].  1.  Acute renal failure-on CKD stage III, baseline creatinine around 2.2. -Secondary to obstructive uropathy -Patient has highly invasive bladder cancer, had hydronephrosis with nephrostomy tubes placed percutaneously and also status post ureteral stenting. -His nephrostomy tubes were capped. -His creatinine was increasing up to 7 and so sent to the hospital. -Nephrostomy tubes have been uncapped and draining clear amber-colored urine - creatinine now at 2.6 -Nephrology consult.  No indication for dialysis.  Continue monitoring urine output at this time. -foley removed as not draining urine. - Renal ultrasound showing worsening of his hydronephrosis likely from the nephrostomy tubes being capped. -Hyponatremia also improved - nephrostomy tubes to be exchanged tomorrow -Port-A-Cath placed this admission  2.  Hyperkalemia- secondary to ARF and acidosis- treated With  lokelma.  Discontinued as potassium is within normal limits  3.  Acute on chronic anemia-secondary to  hematuria.  But hematuria has  cleared up.  discontinue Xarelto -1 unit packed RBC transfusion this admission.  4.  Acute cystitis- cultures growing VRE and E.coli -Patient is on keflex and Zyvox.  Treat for 7 days and stop  5.  Diabetes mellitus-appreciate diabetes coordinator's input - on Lantus and also sliding scale insulin for now. novolog premeal added - dose adjustments done due to hyperglycemia. Hba1c is 9.0  6.  DVT prophylaxis- SQ heparin xarelto on hold  7 A. fib-rate controlled.  On low-dose digoxin, amiodarone.  And metoprolol.  Xarelto on hold at discharge due to anemia   Physical therapy consult requested.  Patient is from [facility] short-term rehab Likely discharge today or tomorrow    All the records are reviewed and case discussed with Care Management/Social Workerr. Management plans discussed with the patient, family and they are in agreement.  CODE STATUS: Full Code  TOTAL TIME TAKING CARE OF THIS PATIENT: 38 minutes.   POSSIBLE D/C TOMORROW, DEPENDING ON CLINICAL CONDITION.   Gladstone Lighter M.D on 11/08/2017 at 2:12 PM  Between 7am to 6pm - Pager - [phone]  After 6pm go to [email or web address] - password [email or web address] [facility]  Office  [phone]  CC: Primary care physician; Albina Billet, MD

## 2017-11-08 NOTE — Progress Notes (Signed)
[facility]  ROUNDING NOTE   Subjective:  Creatinine down to 2.  6 1 today. Good drainage from nephrostomies.    Objective:  Vital signs in last 24 hours:  Temp:  [97.6 F (36.4 C)-98.4 F (36.9 C)] 98.4 F (36.9 C) (10/04 0425) Pulse Rate:  [78-107] 78 (10/04 0425) Resp:  [13-16] 15 (10/04 0425) BP: (118-165)/(52-84) 155/80 (10/04 0425) SpO2:  [97 %-100 %] 97 % (10/04 0425) Weight:  [72.3 kg] 72.3 kg (10/04 0500)  Weight change:  Filed Weights   11/05/17 0155 11/06/17 0500 11/08/17 0500  Weight: 74.3 kg 72.4 kg 72.3 kg    Intake/Output: I/O last 3 completed shifts: In: 1828.4 [I.V.:1723.4; IV Piggyback:105] Out: 2409 [Urine:6450]   Intake/Output this shift:  Total I/O In: -  Out: 450 [Urine:450]  Physical Exam: General: No acute distress  Head: Normocephalic, atraumatic. Moist oral mucosal membranes  Eyes: Anicteric  Neck: Supple, trachea midline  Lungs:  Clear to auscultation, normal effort  Heart: S1S2 no rubs  Abdomen:  Soft, nontender, bowel sounds present, bilateral nephrostomies in palce  Extremities: trace peripheral edema.  Neurologic: Awake, alert, following commands  Skin: No lesions       Basic Metabolic Panel: Recent Labs  Lab 11/04/17 2007 11/05/17 7353 11/06/17 0415 11/06/17 0759 11/06/17 1412 11/07/17 0326 11/08/17 0518  NA 127* 133* 139  --   --  142 144  K 4.6 4.7 5.7*  --  4.3 3.7 4.4  CL 98 107 112*  --   --  110 112*  CO2 17* 17* 19*  --   --  22 24  GLUCOSE 228* 232* 329* 371*  --  135* 172*  BUN 70* 59* 55*  --   --  44* 27*  CREATININE 7.89* 6.86* 4.77*  --   --  3.16* 2.61*  CALCIUM 8.2* 8.1* 8.6*  --   --  8.3* 8.4*    Liver Function Tests: Recent Labs  Lab 11/04/17 2007  AST 14*  ALT 12  ALKPHOS 99  BILITOT 0.5  PROT 6.9  ALBUMIN 2.5*   No results for input(s): LIPASE, AMYLASE in the last 168 hours. No results for input(s): AMMONIA in the last 168 hours.  CBC: Recent Labs  Lab  11/04/17 2007 11/05/17 0611 11/06/17 0415 11/07/17 0326  WBC 12.4* 10.6 9.1 9.5  NEUTROABS 10.2*  --   --   --   HGB 7.6* 7.2* 8.8* 8.5*  HCT 22.1* 20.6* 25.2* 24.2*  MCV 85.5 86.9 86.4 86.4  PLT 606* 573* 659* 628*    Cardiac Enzymes: No results for input(s): CKTOTAL, CKMB, CKMBINDEX, TROPONINI in the last 168 hours.  BNP: Invalid input(s): POCBNP  CBG: Recent Labs  Lab 11/07/17 1159 11/07/17 1405 11/07/17 1721 11/07/17 2112 11/08/17 0738  GLUCAP 130* 103* 137* 218* 171*    Microbiology: Results for orders placed or performed during the hospital encounter of 11/04/17  Urine Culture     Status: Abnormal   Collection Time: 11/04/17  7:36 PM  Result Value Ref Range Status   Specimen Description   Final    URINE, RANDOM Performed at [facility], [address], [place], [place] [address]    Special Requests   Final    NONE Performed at [facility], [address] [place], [place] [address]    Culture (A)  Final    >=100,000 COLONIES/mL ESCHERICHIA COLI >=100,000 COLONIES/mL VANCOMYCIN RESISTANT ENTEROCOCCUS ISOLATED    Report Status 11/07/2017 FINAL  Final  Organism ID, Bacteria ESCHERICHIA COLI (A)  Final   Organism ID, Bacteria VANCOMYCIN RESISTANT ENTEROCOCCUS ISOLATED (A)  Final      Susceptibility   Escherichia coli - MIC*    AMPICILLIN >=32 RESISTANT Resistant     CEFAZOLIN <=4 SENSITIVE Sensitive     CEFTRIAXONE <=1 SENSITIVE Sensitive     CIPROFLOXACIN <=0.25 SENSITIVE Sensitive     GENTAMICIN <=1 SENSITIVE Sensitive     IMIPENEM <=0.25 SENSITIVE Sensitive     NITROFURANTOIN <=16 SENSITIVE Sensitive     TRIMETH/SULFA >=320 RESISTANT Resistant     AMPICILLIN/SULBACTAM 16 INTERMEDIATE Intermediate     PIP/TAZO <=4 SENSITIVE Sensitive     Extended ESBL NEGATIVE Sensitive     * >=100,000 COLONIES/mL ESCHERICHIA COLI   Vancomycin resistant enterococcus isolated - MIC*    AMPICILLIN >=32 RESISTANT Resistant     LEVOFLOXACIN >=8  RESISTANT Resistant     NITROFURANTOIN 256 RESISTANT Resistant     VANCOMYCIN >=32 RESISTANT Resistant     LINEZOLID 2 SENSITIVE Sensitive     * >=100,000 COLONIES/mL VANCOMYCIN RESISTANT ENTEROCOCCUS ISOLATED  MRSA PCR Screening     Status: None   Collection Time: 11/06/17 10:47 PM  Result Value Ref Range Status   MRSA by PCR NEGATIVE NEGATIVE Final    Comment:        The GeneXpert MRSA Assay (FDA approved for NASAL specimens only), is one component of a comprehensive MRSA colonization surveillance program. It is not intended to diagnose MRSA infection nor to guide or monitor treatment for MRSA infections. Performed at [facility], [address] [place], [place] [address]   Surgical PCR screen     Status: None   Collection Time: 11/06/17 10:47 PM  Result Value Ref Range Status   MRSA, PCR NEGATIVE NEGATIVE Final   Staphylococcus aureus NEGATIVE NEGATIVE Final    Comment: (NOTE) The Xpert SA Assay (FDA approved for NASAL specimens in patients 34 years of age and older), is one component of a comprehensive surveillance program. It is not intended to diagnose infection nor to guide or monitor treatment. Performed at [facility], [address] [place], [place] [address]     Coagulation Studies: No results for input(s): LABPROT, INR in the last 72 hours.  Urinalysis: No results for input(s): COLORURINE, LABSPEC, PHURINE, GLUCOSEU, HGBUR, BILIRUBINUR, KETONESUR, PROTEINUR, UROBILINOGEN, NITRITE, LEUKOCYTESUR in the last 72 hours.  Invalid input(s): APPERANCEUR    Imaging: Ir Nephrostomy Exchange Left  Result Date: 11/08/2017 CLINICAL DATA:  Urethral cancer. Bilateral indwelling percutaneous nephrostomy catheters, presents for routine exchange. No current issues. EXAM: BILATERAL PERCUTANEOUS NEPHROSTOMY CATHETER EXCHANGE UNDER FLUOROSCOPY FLUOROSCOPY TIME:  1.9 minute; 12 mGy TECHNIQUE: The nephrostomy tubes and surrounding skin were prepped with  Betadine, draped in usual sterile fashion. A small amount of contrast was injected through the right nephrostomy catheter to opacify the renal collecting system. The catheter was in a peripheral calyx. The catheter was cut and exchanged over a 0.035" angiographic wire. A Kumpe catheter was required to regain access to the central aspect of the renal collecting system. Kumpe was then exchanged for a new 10-French pigtail catheter, formed centrally within the collecting system under fluoroscopy. Contrast injection confirms appropriate positioning. In a similar fashion, the left nephrostomy catheter was injected, cut, and exchanged for a new 10-French pigtail catheter, formed centrally within the left renal collecting system. Injection confirms appropriate positioning and patency. Both catheters were secured externally with a Statlock devices and additional 0-Prolene sutures. The patient tolerated  the procedure well. COMPLICATIONS: None immediate IMPRESSION: 1. Technically successful exchange of bilateral nephrostomy catheters under fluoroscopy Electronically Signed   By: Lucrezia Europe M.D.   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 No current issues. EXAM: BILATERAL PERCUTANEOUS NEPHROSTOMY CATHETER EXCHANGE UNDER FLUOROSCOPY FLUOROSCOPY TIME:  1.9 minute; 12 mGy TECHNIQUE: The nephrostomy tubes and surrounding skin were prepped with Betadine, draped in usual sterile fashion. A small amount of contrast was injected through the right nephrostomy catheter to opacify the renal collecting system. The catheter was in a peripheral calyx. The catheter was cut and exchanged over a 0.035" angiographic wire. A Kumpe catheter was required to regain access to the central aspect of the renal collecting system. Kumpe was then exchanged for a new 10-French pigtail catheter,  formed centrally within the collecting system under fluoroscopy. Contrast injection confirms appropriate positioning. In a similar fashion, the left nephrostomy catheter was injected, cut, and exchanged for a new 10-French pigtail catheter, formed centrally within the left renal collecting system. Injection confirms appropriate positioning and patency. Both catheters were secured externally with a Statlock devices and additional 0-Prolene sutures. The patient tolerated the procedure well. COMPLICATIONS: None immediate IMPRESSION: 1. Technically successful exchange of bilateral nephrostomy catheters under fluoroscopy Electronically Signed   By: Lucrezia Europe M.D.   On: 11/08/2017 09:34     Medications:   . sodium chloride 100 mL/hr at 11/08/17 1146   . albuterol  10 mg Nebulization Once  . amiodarone  200 mg Oral Daily  . amLODipine  10 mg Oral Daily  . cephALEXin  250 mg Oral Q8H  . dextrose  1 ampule Intravenous Once  . digoxin  0.0625 mg Oral Daily  . docusate sodium  100 mg Oral BID  . famotidine  20 mg Oral Daily  . folic acid  1 mg Oral Daily  . heparin injection (subcutaneous)  5,000 Units Subcutaneous Q8H  . insulin aspart  0-15 Units Subcutaneous TID WC  . insulin aspart  0-5 Units Subcutaneous QHS  . insulin aspart  6 Units Subcutaneous TID WC  . insulin glargine  25 Units Subcutaneous Daily  . linezolid  600 mg Oral Q12H  . loratadine  10 mg Oral Daily  . metoprolol tartrate  25 mg Oral BID  . multivitamin with minerals  1 tablet Oral Daily  . protein supplement shake  11 oz Oral BID BM  . rosuvastatin  5 mg Oral Daily  . tamsulosin  0.4 mg Oral QPC supper  . ascorbic acid  250 mg Oral BID   acetaminophen **OR** acetaminophen, bisacodyl, HYDROcodone-acetaminophen, lidocaine-prilocaine, ondansetron **OR** ondansetron (ZOFRAN) IV, traZODone  Assessment/ Plan:  67 y.o. 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    1.  Acute renal failure secondary to obstructive uropathy after nephrostomies capped.  2.  Hyponatremia. 3.  Metabolic acidosis secondary to hydronephrosis. 4.  Altered mental status secondary to uremia. 5.  Hyperkalemia.   Plan: Patient doing better as compared to admission.  Creatinine down to 2.61.  Good drainage from nephrostomies.  Hyponatremia and metabolic acidosis now corrected.  Recommend continued monitoring of renal parameters while he remains admitted.  Otherwise disposition as per hospitalist.  LOS: 4 Sai Moura 10/4/201912:03 PM

## 2017-11-08 NOTE — Progress Notes (Signed)
PT Cancellation Note  Patient Details Name: QUENTON RECENDEZ MRN: [id number] DOB: 09-Dec-1950   Cancelled Treatment:    Reason Eval/Treat Not Completed: Patient declined, no reason specified Pt awake and alert but reports that he is "Just too tired today" to work with PT. Deferred PT at this time.  Kreg Shropshire, DPT 11/08/2017, 4:31 PM

## 2017-11-08 NOTE — Discharge Summary (Signed)
[facility] NAME: Jordan [name]    MR#:  [id number]  DATE OF BIRTH:  1951-01-17  DATE OF ADMISSION:  11/04/2017   ADMITTING PHYSICIAN: Amelia Jo, MD  DATE OF DISCHARGE: 11/08/17  PRIMARY CARE PHYSICIAN: Albina Billet, MD   ADMISSION DIAGNOSIS:   Gross hematuria [V40.0] Complicated UTI (urinary tract infection) [N39.0] Acute kidney injury superimposed on chronic kidney disease ([place]) [N17.9, N18.9] Acute on chronic anemia [D64.9]  DISCHARGE DIAGNOSIS:   Active Problems:   Acute UTI   SECONDARY DIAGNOSIS:   Past Medical History:  Diagnosis Date  . Atrial fibrillation ([place])   . Cancer ([place])    a. 06/2011 b. s/p right hemicolectomy/ colon CA  . Colon polyps   . Dysrhythmia   . Family history of cancer   . Hypertension   . Hyperthyroidism    a. 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 Nursing home resident   . Stroke ([place]) 03/2012   a. residual right sided weakness  . Type 1 diabetes ([place])    a. on insulin b. prior DKA     HOSPITAL COURSE:   67 year old male with past medical history significant for A. fib on Xarelto, history of colon cancer status post right colectomy, locally advanced invasive bladder cancer, hypertension and diabetes mellitus presents to hospital secondary to abnormal labs noted at [facility].  1.  Acute renal failure-on CKD stage III, baseline creatinine around 2.2. -Secondary to obstructive uropathy -Patient has highly invasive bladder cancer, had hydronephrosis with nephrostomy tubes placed percutaneously and also status post ureteral stenting. -initially nephrostomy tubes were capped. -His creatinine was increasing up to 7 and so sent to the hospital. -Nephrostomy tubes have been uncapped and draining clear amber-colored urine - creatinine now at 2.6 -Nephrology consult.  No indication for dialysis.  Continue monitoring urine output at this time. -foley removed  as not draining urine. - Renal ultrasound showing worsening of his hydronephrosis likely from the nephrostomy tubes being capped. -Hyponatremia also improved - nephrostomy tubes  exchanged today -Port-A-Cath placed this admission  2.  Hyperkalemia- secondary to ARF and acidosis- treated With  lokelma.  Discontinued as potassium is within normal limits  3.  Acute on chronic anemia-secondary to hematuria.  But hematuria has cleared up.  discontinue Xarelto -1 unit packed RBC transfusion this admission.  4.  Acute cystitis- cultures growing VRE and E.coli -Patient is on keflex and Zyvox.  Treat for 7 days and stop  5.  Diabetes mellitus-appreciate diabetes coordinator's input - on Lantus and also sliding scale insulin for now. novolog premeal added - dose adjustments done due to hyperglycemia. Hba1c is 9.0  6. A. fib-rate controlled.  On low-dose digoxin, amiodarone.  And metoprolol.  Xarelto discontinued at discharge due to anemia -Sister updated.  Please evaluate as outpatient   Physical therapy consult requested.  Patient is from [facility] short-term rehab Likely discharge today  DISCHARGE CONDITIONS:   Guarded  CONSULTS OBTAINED:   Treatment Team:  Anthonette Legato, MD Abbie Sons, MD  DRUG ALLERGIES:   Allergies  Allergen Reactions  . Amiodarone Other (See Comments)    Thyrotoxicosis per Murray Hodgkins, NP   DISCHARGE MEDICATIONS:   Allergies as of 11/08/2017      Reactions   Amiodarone Other (See Comments)   Thyrotoxicosis per Murray Hodgkins, NP      Medication List    STOP taking these medications  Rivaroxaban 15 MG Tabs tablet Commonly known as:  XARELTO     TAKE these medications   acetaminophen 325 MG tablet Commonly known as:  TYLENOL Take 2 tablets (650 mg total) by mouth every 6 (six) hours as needed for mild pain (or Fever >/= 101).   amiodarone 200 MG tablet Commonly known as:  PACERONE Take 200 mg by mouth daily.     amLODipine 10 MG tablet Commonly known as:  NORVASC Take 10 mg by mouth daily.   ascorbic acid 250 MG tablet Commonly known as:  VITAMIN C Take 1 tablet (250 mg total) by mouth 2 (two) times daily.   atorvastatin 40 MG tablet Commonly known as:  LIPITOR Take 40 mg by mouth daily.   cephALEXin 250 MG capsule Commonly known as:  KEFLEX Take 1 capsule (250 mg total) by mouth every 8 (eight) hours for 6 days. What changed:    medication strength  how much to take  when to take this   Digoxin 62.5 MCG Tabs Take 0.0625 mg by mouth daily.   docusate sodium 100 MG capsule Commonly known as:  COLACE Take 1 capsule (100 mg total) by mouth 2 (two) times daily.   famotidine 20 MG tablet Commonly known as:  PEPCID Take 1 tablet (20 mg total) by mouth daily.   folic acid 1 MG tablet Commonly known as:  FOLVITE Take 1 mg by mouth daily.   HYDROcodone-acetaminophen 5-325 MG tablet Commonly known as:  NORCO/VICODIN Take 1-2 tablets by mouth every 6 (six) hours as needed for moderate pain.   insulin aspart 100 UNIT/ML injection Commonly known as:  novoLOG Inject 10 Units into the skin 3 (three) times daily with meals. What changed:  how much to take   insulin aspart 100 UNIT/ML injection Commonly known as:  novoLOG Inject 0-15 Units into the skin 4 (four) times daily -  before meals and at bedtime. CBG < 70: implement hypoglycemia protocol CBG 70 - 120: 0 units CBG 121 - 150: 2 units CBG 151 - 200: 3 units CBG 201 - 250: 5 units CBG 251 - 300: 8 units CBG 301 - 350: 11 units CBG 351 - 400: 15 units CBG > 400: call MD and obtain STAT lab verification What changed:  Another medication with the same name was changed. Make sure you understand how and when to take each.   insulin glargine 100 UNIT/ML injection Commonly known as:  LANTUS Inject 0.37 mLs (37 Units total) into the skin daily.   lidocaine-prilocaine cream Commonly known as:  EMLA Apply to port-a-cath prior to  accessing What changed:  additional instructions   linezolid 600 MG tablet Commonly known as:  ZYVOX Take 1 tablet (600 mg total) by mouth every 12 (twelve) hours for 6 days.   loratadine 10 MG tablet Commonly known as:  CLARITIN Take 1 tablet (10 mg total) by mouth daily.   metoprolol tartrate 25 MG tablet Commonly known as:  LOPRESSOR Take 1 tablet (25 mg total) by mouth 2 (two) times daily.   multivitamin with minerals Tabs tablet Take 1 tablet by mouth daily.   MYLANTA GAS PO Take by mouth.   ondansetron 8 MG tablet Commonly known as:  ZOFRAN Take 1 tablet (8 mg total) by mouth 2 (two) times daily as needed (Nausea or vomiting).   oxybutynin 5 MG tablet Commonly known as:  DITROPAN Take 1 tablet (5 mg total) by mouth every 8 (eight) hours as needed for bladder spasms.  polyethylene glycol packet Commonly known as:  MIRALAX / GLYCOLAX Take 17 g by mouth daily.   prochlorperazine 10 MG tablet Commonly known as:  COMPAZINE Take 1 tablet (10 mg total) by mouth every 6 (six) hours as needed (Nausea or vomiting).   protein supplement shake Liqd Commonly known as:  PREMIER PROTEIN Take 325 mLs (11 oz total) by mouth 2 (two) times daily between meals.   rosuvastatin 5 MG tablet Commonly known as:  CRESTOR Take 5 mg by mouth daily.   senna-docusate 8.6-50 MG tablet Commonly known as:  Senokot-S Take 1 tablet by mouth daily.   tamsulosin 0.4 MG Caps capsule Commonly known as:  FLOMAX Take 1 capsule (0.4 mg total) by mouth daily after supper.   traZODone 50 MG tablet Commonly known as:  DESYREL Take 50 mg by mouth at bedtime.        DISCHARGE INSTRUCTIONS:   1. Urology f/u in 2-3 weeks 2. PCP f/u in 1-2 weeks 3. Nephrology f/u in 2-3 weeks  DIET:   Cardiac diet and Diabetic diet  ACTIVITY:   Activity as tolerated  OXYGEN:   Home Oxygen: No.  Oxygen Delivery: room air  DISCHARGE LOCATION:   nursing home   If you experience worsening of your  admission symptoms, develop shortness of breath, life threatening emergency, suicidal or homicidal thoughts you must seek medical attention immediately by calling 911 or calling your MD immediately  if symptoms less severe.  You Must read complete instructions/literature along with all the possible adverse reactions/side effects for all the Medicines you take and that have been prescribed to you. Take any new Medicines after you have completely understood and accpet all the possible adverse reactions/side effects.   Please note  You were cared for by a hospitalist during your hospital stay. If you have any questions about your discharge medications or the care you received while you were in the hospital after you are discharged, you can call the unit and asked to speak with the hospitalist on call if the hospitalist that took care of you is not available. Once you are discharged, your primary care physician will handle any further medical issues. Please note that NO REFILLS for any discharge medications will be authorized once you are discharged, as it is imperative that you return to your primary care physician (or establish a relationship with a primary care physician if you do not have one) for your aftercare needs so that they can reassess your need for medications and monitor your lab values.    On the day of Discharge:  VITAL SIGNS:   Blood pressure (!) 160/83, pulse 82, temperature 98.2 F (36.8 C), temperature source Oral, resp. rate 18, height 5\' 9"  (1.753 m), weight 72.3 kg, SpO2 99 %.  PHYSICAL EXAMINATION:    GENERAL:  67 y.o.-year-old patient lying in the bed with no acute distress.  EYES: Pupils equal, round, reactive to light and accommodation. No scleral icterus. Extraocular muscles intact.  HEENT: Head atraumatic, normocephalic. Oropharynx and nasopharynx clear.  NECK:  Supple, no jugular venous distention. No thyroid enlargement, no tenderness.  LUNGS: Normal breath sounds  bilaterally, no wheezing, rales,rhonchi or crepitation. No use of accessory muscles of respiration. Decreased bibasilar breath sounds CARDIOVASCULAR: S1, S2 normal. No murmurs, rubs, or gallops.  ABDOMEN: Soft, nontender, nondistended. Bowel sounds present. No organomegaly or mass.  - bilateral nephrostomy tubes in place EXTREMITIES: No pedal edema, cyanosis, or clubbing.  NEUROLOGIC: Cranial nerves II through XII are intact. Muscle  strength 5/5 in all extremities. Sensation intact. Gait not checked. Global weakness noted. PSYCHIATRIC: The patient is alert and oriented to self  SKIN: No obvious rash, lesion, or ulcer.    DATA REVIEW:   CBC Recent Labs  Lab 11/07/17 0326  WBC 9.5  HGB 8.5*  HCT 24.2*  PLT 628*    Chemistries  Recent Labs  Lab 11/04/17 2007  11/08/17 0518  NA 127*   < > 144  K 4.6   < > 4.4  CL 98   < > 112*  CO2 17*   < > 24  GLUCOSE 228*   < > 172*  BUN 70*   < > 27*  CREATININE 7.89*   < > 2.61*  CALCIUM 8.2*   < > 8.4*  AST 14*  --   --   ALT 12  --   --   ALKPHOS 99  --   --   BILITOT 0.5  --   --    < > = values in this interval not displayed.     Microbiology Results  Results for orders placed or performed during the hospital encounter of 11/04/17  Urine Culture     Status: Abnormal   Collection Time: 11/04/17  7:36 PM  Result Value Ref Range Status   Specimen Description   Final    URINE, RANDOM Performed at [facility], [address] [place], [place] [address]    Special Requests   Final    NONE Performed at [facility], [address] [place], [place] [address]    Culture (A)  Final    >=100,000 COLONIES/mL ESCHERICHIA COLI >=100,000 COLONIES/mL VANCOMYCIN RESISTANT ENTEROCOCCUS ISOLATED    Report Status 11/07/2017 FINAL  Final   Organism ID, Bacteria ESCHERICHIA COLI (A)  Final   Organism ID, Bacteria VANCOMYCIN RESISTANT ENTEROCOCCUS ISOLATED (A)  Final      Susceptibility   Escherichia coli - MIC*     AMPICILLIN >=32 RESISTANT Resistant     CEFAZOLIN <=4 SENSITIVE Sensitive     CEFTRIAXONE <=1 SENSITIVE Sensitive     CIPROFLOXACIN <=0.25 SENSITIVE Sensitive     GENTAMICIN <=1 SENSITIVE Sensitive     IMIPENEM <=0.25 SENSITIVE Sensitive     NITROFURANTOIN <=16 SENSITIVE Sensitive     TRIMETH/SULFA >=320 RESISTANT Resistant     AMPICILLIN/SULBACTAM 16 INTERMEDIATE Intermediate     PIP/TAZO <=4 SENSITIVE Sensitive     Extended ESBL NEGATIVE Sensitive     * >=100,000 COLONIES/mL ESCHERICHIA COLI   Vancomycin resistant enterococcus isolated - MIC*    AMPICILLIN >=32 RESISTANT Resistant     LEVOFLOXACIN >=8 RESISTANT Resistant     NITROFURANTOIN 256 RESISTANT Resistant     VANCOMYCIN >=32 RESISTANT Resistant     LINEZOLID 2 SENSITIVE Sensitive     * >=100,000 COLONIES/mL VANCOMYCIN RESISTANT ENTEROCOCCUS ISOLATED  MRSA PCR Screening     Status: None   Collection Time: 11/06/17 10:47 PM  Result Value Ref Range Status   MRSA by PCR NEGATIVE NEGATIVE Final    Comment:        The GeneXpert MRSA Assay (FDA approved for NASAL specimens only), is one component of a comprehensive MRSA colonization surveillance program. It is not intended to diagnose MRSA infection nor to guide or monitor treatment for MRSA infections. Performed at [facility], [address], [place],  [address]   Surgical PCR screen     Status: None   Collection Time: 11/06/17 10:47 PM  Result  Value Ref Range Status   MRSA, PCR NEGATIVE NEGATIVE Final   Staphylococcus aureus NEGATIVE NEGATIVE Final    Comment: (NOTE) The Xpert SA Assay (FDA approved for NASAL specimens in patients 80 years of age and older), is one component of a comprehensive surveillance program. It is not intended to diagnose infection nor to guide or monitor treatment. Performed at [facility], [address], [place], [place] [address]     RADIOLOGY:  Ir Nephrostomy Exchange Left  Result Date:  11/08/2017 CLINICAL DATA:  Urethral cancer. Bilateral indwelling percutaneous nephrostomy catheters, presents for routine exchange. No current issues. EXAM: BILATERAL PERCUTANEOUS NEPHROSTOMY CATHETER EXCHANGE UNDER FLUOROSCOPY FLUOROSCOPY TIME:  1.9 minute; 12 mGy TECHNIQUE: The nephrostomy tubes and surrounding skin were prepped with Betadine, draped in usual sterile fashion. A small amount of contrast was injected through the right nephrostomy catheter to opacify the renal collecting system. The catheter was in a peripheral calyx. The catheter was cut and exchanged over a 0.035" angiographic wire. A Kumpe catheter was required to regain access to the central aspect of the renal collecting system. Kumpe was then exchanged for a new 10-French pigtail catheter, formed centrally within the collecting system under fluoroscopy. Contrast injection confirms appropriate positioning. In a similar fashion, the left nephrostomy catheter was injected, cut, and exchanged for a new 10-French pigtail catheter, formed centrally within the left renal collecting system. Injection confirms appropriate positioning and patency. Both catheters were secured externally with a Statlock devices and additional 0-Prolene sutures. The patient tolerated the procedure well. COMPLICATIONS: None immediate IMPRESSION: 1. Technically successful exchange of bilateral nephrostomy catheters under fluoroscopy Electronically Signed   By: Lucrezia Europe M.D.   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 No current issues. EXAM: BILATERAL PERCUTANEOUS NEPHROSTOMY CATHETER EXCHANGE UNDER FLUOROSCOPY FLUOROSCOPY TIME:  1.9 minute; 12 mGy TECHNIQUE: The nephrostomy tubes and surrounding skin were prepped with Betadine, draped in usual sterile fashion. A small amount of contrast was injected through the  right nephrostomy catheter to opacify the renal collecting system. The catheter was in a peripheral calyx. The catheter was cut and exchanged over a 0.035" angiographic wire. A Kumpe catheter was required to regain access to the central aspect of the renal collecting system. Kumpe was then exchanged for a new 10-French pigtail catheter, formed centrally within the collecting system under fluoroscopy. Contrast injection confirms appropriate positioning. In a similar fashion, the left nephrostomy catheter was injected, cut, and exchanged for a new 10-French pigtail catheter, formed centrally within the left renal collecting system. Injection confirms appropriate positioning and patency. Both catheters were secured externally with a Statlock devices and additional 0-Prolene sutures. The patient tolerated the procedure well. COMPLICATIONS: None immediate IMPRESSION: 1. Technically successful exchange of bilateral nephrostomy catheters under fluoroscopy Electronically Signed   By: Lucrezia Europe M.D.   On: 11/08/2017 09:34     Management plans discussed with the patient, family and they are in agreement.  CODE STATUS:     Code Status Orders  (From admission, onward)         Start     Ordered   11/05/17 0138  Full code  Continuous     11/05/17 0137        Code Status History    Date Active Date Inactive Code Status Order ID Comments User Context   10/26/2017 0043 10/28/2017 2128 Full Code [id number]  Sela Hua, MD Inpatient   09/21/2017 1548 10/01/2017 1912 Full Code [id number]  Loletha Grayer, MD ED   03/21/2014 0955 03/30/2014 1829 Full Code [id number]  Bensimhon, Shaune Pascal, MD Inpatient   03/20/2014 2024 03/21/2014 0955 Full Code [id number]  Luz Brazen, MD Inpatient   03/20/2014 2023 03/20/2014 2024 DNR [id number]  Luz Brazen, MD Inpatient   03/19/2014 2205 03/20/2014 2023 Full Code [id number]  Raliegh Ip, MD Inpatient    Advance Directive Documentation     Most Recent Value  Type of Advance  Directive  Out of facility DNR (pink MOST or yellow form)  Pre-existing out of facility DNR order (yellow form or pink MOST form)  -  "MOST" Form in Place?  -      TOTAL TIME TAKING CARE OF THIS PATIENT: 36 minutes.    Gladstone Lighter M.D on 11/08/2017 at 2:18 PM  Between 7am to 6pm - Pager - [phone]  After 6pm go to [email or web address] - Proofreader  [facility]  Office  [phone]  CC: Primary care physician; Albina Billet, MD   Note: This dictation was prepared with Dragon dictation along with smaller phrase technology. Any transcriptional errors that result from this process are unintentional.

## 2017-11-08 NOTE — Plan of Care (Signed)

## 2017-11-08 NOTE — Clinical Social Work Note (Addendum)
CSW contacted [facility] and they said they do not have a bed available today but they will have one tomorrow, they are also awaiting for insurance authorization.  CSW asked Tammy in admissions that if [facility] does have a bed open up today to contact CSW.  CSW updated patient's sister Basilia Jumbo, [phone] and updated her on what [facility] said.  CSW updated bedside nurse and physician, CSW to continue to follow patient's progress throughout discharge planning.  4:30pm Still no bed available at [facility] for discharge today, bed should be available tomorrow, updated physician and bedside nurse.  CSW to continue to follow patient's progress throughout discharge planning.  Jones Broom. [place], MSW, [place]  11/08/2017 12:07 PM

## 2017-11-08 NOTE — Progress Notes (Signed)
[facility]  Daily Progress Note   Port-A-Cath Placement on 11/07/17 by Dr. Lucky Cowboy No need for outpatient follow up unless patient would like Port-A-Cath removed. Vascular surgery will sign off at this time.  Please reconsult if necessary.  Marcelle Overlie PA-C 11/08/2017 8:52 AM

## 2017-11-08 NOTE — Procedures (Signed)
  Procedure: Exchange bilat perc nephrostomy tubes 6F EBL:   minimal Complications:  none immediate  See full dictation in [facility].  Dillard Cannon MD Main # [phone] Pager  [phone]

## 2017-11-09 LAB — GLUCOSE, CAPILLARY
Glucose-Capillary: 126 mg/dL — ABNORMAL HIGH (ref 70–99)
Glucose-Capillary: 147 mg/dL — ABNORMAL HIGH (ref 70–99)
Glucose-Capillary: 175 mg/dL — ABNORMAL HIGH (ref 70–99)
Glucose-Capillary: 286 mg/dL — ABNORMAL HIGH (ref 70–99)

## 2017-11-09 LAB — BASIC METABOLIC PANEL
ANION GAP: 10 (ref 5–15)
BUN: 21 mg/dL (ref 8–23)
CALCIUM: 8.2 mg/dL — AB (ref 8.9–10.3)
CO2: 22 mmol/L (ref 22–32)
Chloride: 107 mmol/L (ref 98–111)
Creatinine, Ser: 2.24 mg/dL — ABNORMAL HIGH (ref 0.61–1.24)
GFR calc Af Amer: 33 mL/min — ABNORMAL LOW (ref >60)
GFR, EST NON AFRICAN AMERICAN: 29 mL/min — AB (ref >60)
GLUCOSE: 179 mg/dL — AB (ref 70–99)
Potassium: 3.6 mmol/L (ref 3.5–5.1)
SODIUM: 139 mmol/L (ref 135–145)

## 2017-11-09 NOTE — Progress Notes (Signed)
[facility]  ROUNDING NOTE   Subjective:  Creatinine continues to trend down slowly. Today's creatinine was 2.24. Patient resting comfortably in bed.   Objective:  Vital signs in last 24 hours:  Temp:  [97.6 F (36.4 C)-98.8 F (37.1 C)] 98.8 F (37.1 C) (10/05 1144) Pulse Rate:  [65-93] 65 (10/05 1144) Resp:  [15-18] 17 (10/05 0438) BP: (133-160)/(73-83) 135/73 (10/05 1144) SpO2:  [98 %-100 %] 99 % (10/05 1144) Weight:  [71.6 kg] 71.6 kg (10/05 0438)  Weight change: -0.771 kg Filed Weights   11/06/17 0500 11/08/17 0500 11/09/17 0438  Weight: 72.4 kg 72.3 kg 71.6 kg    Intake/Output: I/O last 3 completed shifts: In: 3400.1 [I.V.:3400.1] Out: 6160 [Urine:6315]   Intake/Output this shift:  Total I/O In: 709.1 [P.O.:325; I.V.:384.1] Out: 475 [Urine:475]  Physical Exam: General: No acute distress  Head: Normocephalic, atraumatic. Moist oral mucosal membranes  Eyes: Anicteric  Neck: Supple, trachea midline  Lungs:  Clear to auscultation, normal effort  Heart: S1S2 no rubs  Abdomen:  Soft, nontender, bowel sounds present, bilateral nephrostomies in place  Extremities: trace peripheral edema.  Neurologic: Awake, alert, following commands  Skin: No lesions       Basic Metabolic Panel: Recent Labs  Lab 11/05/17 0611 11/06/17 0415 11/06/17 0759 11/06/17 1412 11/07/17 0326 11/08/17 0518 11/09/17 1004  NA 133* 139  --   --  142 144 139  K 4.7 5.7*  --  4.3 3.7 4.4 3.6  CL 107 112*  --   --  110 112* 107  CO2 17* 19*  --   --  22 24 22   GLUCOSE 232* 329* 371*  --  135* 172* 179*  BUN 59* 55*  --   --  44* 27* 21  CREATININE 6.86* 4.77*  --   --  3.16* 2.61* 2.24*  CALCIUM 8.1* 8.6*  --   --  8.3* 8.4* 8.2*    Liver Function Tests: Recent Labs  Lab 11/04/17 2007  AST 14*  ALT 12  ALKPHOS 99  BILITOT 0.5  PROT 6.9  ALBUMIN 2.5*   No results for input(s): LIPASE, AMYLASE in the last 168 hours. No results for input(s): AMMONIA in the  last 168 hours.  CBC: Recent Labs  Lab 11/04/17 2007 11/05/17 0611 11/06/17 0415 11/07/17 0326  WBC 12.4* 10.6 9.1 9.5  NEUTROABS 10.2*  --   --   --   HGB 7.6* 7.2* 8.8* 8.5*  HCT 22.1* 20.6* 25.2* 24.2*  MCV 85.5 86.9 86.4 86.4  PLT 606* 573* 659* 628*    Cardiac Enzymes: No results for input(s): CKTOTAL, CKMB, CKMBINDEX, TROPONINI in the last 168 hours.  BNP: Invalid input(s): POCBNP  CBG: Recent Labs  Lab 11/08/17 1627 11/08/17 1724 11/08/17 2106 11/09/17 0743 11/09/17 1143  GLUCAP 402* 395* 271* 126* 147*    Microbiology: Results for orders placed or performed during the hospital encounter of 11/04/17  Urine Culture     Status: Abnormal   Collection Time: 11/04/17  7:36 PM  Result Value Ref Range Status   Specimen Description   Final    URINE, RANDOM Performed at [facility], [address] [place], [place] [address]    Special Requests   Final    NONE Performed at [facility], [address] [place], [place] [address]    Culture (A)  Final    >=100,000 COLONIES/mL ESCHERICHIA COLI >=100,000 COLONIES/mL VANCOMYCIN RESISTANT ENTEROCOCCUS ISOLATED    Report Status 11/07/2017 FINAL  Final   Organism ID, Bacteria ESCHERICHIA COLI (A)  Final   Organism ID, Bacteria VANCOMYCIN RESISTANT ENTEROCOCCUS ISOLATED (A)  Final      Susceptibility   Escherichia coli - MIC*    AMPICILLIN >=32 RESISTANT Resistant     CEFAZOLIN <=4 SENSITIVE Sensitive     CEFTRIAXONE <=1 SENSITIVE Sensitive     CIPROFLOXACIN <=0.25 SENSITIVE Sensitive     GENTAMICIN <=1 SENSITIVE Sensitive     IMIPENEM <=0.25 SENSITIVE Sensitive     NITROFURANTOIN <=16 SENSITIVE Sensitive     TRIMETH/SULFA >=320 RESISTANT Resistant     AMPICILLIN/SULBACTAM 16 INTERMEDIATE Intermediate     PIP/TAZO <=4 SENSITIVE Sensitive     Extended ESBL NEGATIVE Sensitive     * >=100,000 COLONIES/mL ESCHERICHIA COLI   Vancomycin resistant enterococcus isolated - MIC*    AMPICILLIN >=32  RESISTANT Resistant     LEVOFLOXACIN >=8 RESISTANT Resistant     NITROFURANTOIN 256 RESISTANT Resistant     VANCOMYCIN >=32 RESISTANT Resistant     LINEZOLID 2 SENSITIVE Sensitive     * >=100,000 COLONIES/mL VANCOMYCIN RESISTANT ENTEROCOCCUS ISOLATED  MRSA PCR Screening     Status: None   Collection Time: 11/06/17 10:47 PM  Result Value Ref Range Status   MRSA by PCR NEGATIVE NEGATIVE Final    Comment:        The GeneXpert MRSA Assay (FDA approved for NASAL specimens only), is one component of a comprehensive MRSA colonization surveillance program. It is not intended to diagnose MRSA infection nor to guide or monitor treatment for MRSA infections. Performed at [facility], [address] [place], [place] [address]   Surgical PCR screen     Status: None   Collection Time: 11/06/17 10:47 PM  Result Value Ref Range Status   MRSA, PCR NEGATIVE NEGATIVE Final   Staphylococcus aureus NEGATIVE NEGATIVE Final    Comment: (NOTE) The Xpert SA Assay (FDA approved for NASAL specimens in patients 79 years of age and older), is one component of a comprehensive surveillance program. It is not intended to diagnose infection nor to guide or monitor treatment. Performed at [facility], [address] [place], [place] [address]     Coagulation Studies: No results for input(s): LABPROT, INR in the last 72 hours.  Urinalysis: No results for input(s): COLORURINE, LABSPEC, PHURINE, GLUCOSEU, HGBUR, BILIRUBINUR, KETONESUR, PROTEINUR, UROBILINOGEN, NITRITE, LEUKOCYTESUR in the last 72 hours.  Invalid input(s): APPERANCEUR    Imaging: Ir Nephrostomy Exchange Left  Result Date: 11/08/2017 CLINICAL DATA:  Urethral cancer. Bilateral indwelling percutaneous nephrostomy catheters, presents for routine exchange. No current issues. EXAM: BILATERAL PERCUTANEOUS NEPHROSTOMY CATHETER EXCHANGE UNDER FLUOROSCOPY FLUOROSCOPY TIME:  1.9 minute; 12 mGy TECHNIQUE: The nephrostomy tubes  and surrounding skin were prepped with Betadine, draped in usual sterile fashion. A small amount of contrast was injected through the right nephrostomy catheter to opacify the renal collecting system. The catheter was in a peripheral calyx. The catheter was cut and exchanged over a 0.035" angiographic wire. A Kumpe catheter was required to regain access to the central aspect of the renal collecting system. Kumpe was then exchanged for a new 10-French pigtail catheter, formed centrally within the collecting system under fluoroscopy. Contrast injection confirms appropriate positioning. In a similar fashion, the left nephrostomy catheter was injected, cut, and exchanged for a new 10-French pigtail catheter, formed centrally within the left renal collecting system. Injection confirms appropriate positioning and patency. Both catheters were secured externally with a Statlock devices and additional 0-Prolene sutures.  The patient tolerated the procedure well. COMPLICATIONS: None immediate IMPRESSION: 1. Technically successful exchange of bilateral nephrostomy catheters under fluoroscopy Electronically Signed   By: Lucrezia Europe M.D.   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 No current issues. EXAM: BILATERAL PERCUTANEOUS NEPHROSTOMY CATHETER EXCHANGE UNDER FLUOROSCOPY FLUOROSCOPY TIME:  1.9 minute; 12 mGy TECHNIQUE: The nephrostomy tubes and surrounding skin were prepped with Betadine, draped in usual sterile fashion. A small amount of contrast was injected through the right nephrostomy catheter to opacify the renal collecting system. The catheter was in a peripheral calyx. The catheter was cut and exchanged over a 0.035" angiographic wire. A Kumpe catheter was required to regain access to the central aspect of the renal collecting system. Kumpe was then exchanged  for a new 10-French pigtail catheter, formed centrally within the collecting system under fluoroscopy. Contrast injection confirms appropriate positioning. In a similar fashion, the left nephrostomy catheter was injected, cut, and exchanged for a new 10-French pigtail catheter, formed centrally within the left renal collecting system. Injection confirms appropriate positioning and patency. Both catheters were secured externally with a Statlock devices and additional 0-Prolene sutures. The patient tolerated the procedure well. COMPLICATIONS: None immediate IMPRESSION: 1. Technically successful exchange of bilateral nephrostomy catheters under fluoroscopy Electronically Signed   By: Lucrezia Europe M.D.   On: 11/08/2017 09:34     Medications:    . albuterol  10 mg Nebulization Once  . amiodarone  200 mg Oral Daily  . amLODipine  10 mg Oral Daily  . cephALEXin  250 mg Oral Q8H  . dextrose  1 ampule Intravenous Once  . digoxin  0.0625 mg Oral Daily  . docusate sodium  100 mg Oral BID  . famotidine  20 mg Oral Daily  . folic acid  1 mg Oral Daily  . heparin injection (subcutaneous)  5,000 Units Subcutaneous Q8H  . insulin aspart  0-15 Units Subcutaneous TID WC  . insulin aspart  0-5 Units Subcutaneous QHS  . insulin aspart  6 Units Subcutaneous TID WC  . insulin glargine  25 Units Subcutaneous Daily  . linezolid  600 mg Oral Q12H  . loratadine  10 mg Oral Daily  . metoprolol tartrate  25 mg Oral BID  . multivitamin with minerals  1 tablet Oral Daily  . protein supplement shake  11 oz Oral BID BM  . rosuvastatin  5 mg Oral Daily  . tamsulosin  0.4 mg Oral QPC supper  . ascorbic acid  250 mg Oral BID   acetaminophen **OR** acetaminophen, bisacodyl, HYDROcodone-acetaminophen, lidocaine-prilocaine, ondansetron **OR** ondansetron (ZOFRAN) IV, traZODone  Assessment/ Plan:  67 y.o. 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    1.  Acute renal failure secondary to obstructive uropathy after nephrostomies capped.  2.  Hyponatremia. 3.  Metabolic acidosis secondary to hydronephrosis. 4.  Altered mental status secondary to uremia. 5.  Hyperkalemia.   Plan: Patient continues to experience renal recovery.  Creatinine down to 2.24.  Urine output was 3.4 L over the preceding 24 hours.  Continue supportive care at this point in time.  Otherwise continue to follow urology recommendations.  LOS: 5 Jordan [name] 10/5/201912:44 PM

## 2017-11-09 NOTE — Progress Notes (Signed)
[facility] NAME: Jordan [name]    MR#:  [id number]  DATE OF BIRTH:  1950-02-18  SUBJECTIVE:   Acute events overnight, patient awaiting insurance authorization to discharge to rehab.  Creatinine is trending down.  Patient has no abdominal pain nausea or vomiting.  REVIEW OF SYSTEMS:  Review of Systems  Constitutional: Negative for chills, fever and malaise/fatigue.  HENT: Negative for congestion, ear discharge, hearing loss and nosebleeds.   Respiratory: Negative for cough, shortness of breath and wheezing.   Cardiovascular: Negative for chest pain, palpitations and leg swelling.  Gastrointestinal: Negative for abdominal pain, constipation, diarrhea, nausea and vomiting.  Genitourinary: Negative for dysuria.  Musculoskeletal: Negative for myalgias.  Neurological: Negative for dizziness, focal weakness, seizures, weakness and headaches.  Psychiatric/Behavioral: Negative for depression.    DRUG ALLERGIES:   Allergies  Allergen Reactions  . Amiodarone Other (See Comments)    Thyrotoxicosis per Murray Hodgkins, NP    VITALS:  Blood pressure 135/73, pulse 65, temperature 98.8 F (37.1 C), temperature source Oral, resp. rate 17, height 5\' 9"  (1.753 m), weight 71.6 kg, SpO2 99 %.  PHYSICAL EXAMINATION:  Physical Exam   GENERAL:  67 y.o.-year-old patient lying in the bed with no acute distress.  EYES: Pupils equal, round, reactive to light and accommodation. No scleral icterus. Extraocular muscles intact.  HEENT: Head atraumatic, normocephalic. Oropharynx and nasopharynx clear.  NECK:  Supple, no jugular venous distention. No thyroid enlargement, no tenderness.  LUNGS: Normal breath sounds bilaterally, no wheezing, rales,rhonchi or crepitation. No use of accessory muscles of respiration. Decreased bibasilar breath sounds CARDIOVASCULAR: S1, S2 normal. No murmurs, rubs, or gallops.  ABDOMEN: Soft, nontender, nondistended.  Bowel sounds present. No organomegaly or mass.  - bilateral nephrostomy tubes in place EXTREMITIES: No pedal edema, cyanosis, or clubbing.  NEUROLOGIC: Cranial nerves II through XII are intact. No focal motor or sensory deficits appreciated bilaterally, globally weak.   PSYCHIATRIC: The patient is alert and oriented to self  SKIN: No obvious rash, lesion, or ulcer.    LABORATORY PANEL:   CBC Recent Labs  Lab 11/07/17 0326  WBC 9.5  HGB 8.5*  HCT 24.2*  PLT 628*   ------------------------------------------------------------------------------------------------------------------  Chemistries  Recent Labs  Lab 11/04/17 2007  11/09/17 1004  NA 127*   < > 139  K 4.6   < > 3.6  CL 98   < > 107  CO2 17*   < > 22  GLUCOSE 228*   < > 179*  BUN 70*   < > 21  CREATININE 7.89*   < > 2.24*  CALCIUM 8.2*   < > 8.2*  AST 14*  --   --   ALT 12  --   --   ALKPHOS 99  --   --   BILITOT 0.5  --   --    < > = values in this interval not displayed.   ------------------------------------------------------------------------------------------------------------------  Cardiac Enzymes No results for input(s): TROPONINI in the last 168 hours. ------------------------------------------------------------------------------------------------------------------  RADIOLOGY:  Ir Nephrostomy Exchange Left  Result Date: 11/08/2017 CLINICAL DATA:  Urethral cancer. Bilateral indwelling percutaneous nephrostomy catheters, presents for routine exchange. No current issues. EXAM: BILATERAL PERCUTANEOUS NEPHROSTOMY CATHETER EXCHANGE UNDER FLUOROSCOPY FLUOROSCOPY TIME:  1.9 minute; 12 mGy TECHNIQUE: The nephrostomy tubes and surrounding skin were prepped with Betadine, draped in usual sterile fashion. A small amount of contrast was injected through the right nephrostomy catheter to opacify the renal collecting system.  The catheter was in a peripheral calyx. The catheter was cut and exchanged over a 0.035"  angiographic wire. A Kumpe catheter was required to regain access to the central aspect of the renal collecting system. Kumpe was then exchanged for a new 10-French pigtail catheter, formed centrally within the collecting system under fluoroscopy. Contrast injection confirms appropriate positioning. In a similar fashion, the left nephrostomy catheter was injected, cut, and exchanged for a new 10-French pigtail catheter, formed centrally within the left renal collecting system. Injection confirms appropriate positioning and patency. Both catheters were secured externally with a Statlock devices and additional 0-Prolene sutures. The patient tolerated the procedure well. COMPLICATIONS: None immediate IMPRESSION: 1. Technically successful exchange of bilateral nephrostomy catheters under fluoroscopy Electronically Signed   By: Lucrezia Europe M.D.   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 No current issues. EXAM: BILATERAL PERCUTANEOUS NEPHROSTOMY CATHETER EXCHANGE UNDER FLUOROSCOPY FLUOROSCOPY TIME:  1.9 minute; 12 mGy TECHNIQUE: The nephrostomy tubes and surrounding skin were prepped with Betadine, draped in usual sterile fashion. A small amount of contrast was injected through the right nephrostomy catheter to opacify the renal collecting system. The catheter was in a peripheral calyx. The catheter was cut and exchanged over a 0.035" angiographic wire. A Kumpe catheter was required to regain access to the central aspect of the renal collecting system. Kumpe was then exchanged for a new 10-French pigtail catheter, formed centrally within the collecting system under fluoroscopy. Contrast injection confirms appropriate positioning. In a similar fashion, the left nephrostomy catheter was injected, cut, and exchanged for a new 10-French pigtail catheter, formed  centrally within the left renal collecting system. Injection confirms appropriate positioning and patency. Both catheters were secured externally with a Statlock devices and additional 0-Prolene sutures. The patient tolerated the procedure well. COMPLICATIONS: None immediate IMPRESSION: 1. Technically successful exchange of bilateral nephrostomy catheters under fluoroscopy Electronically Signed   By: Lucrezia Europe M.D.   On: 11/08/2017 09:34    EKG:   Orders placed or performed during the hospital encounter of 10/25/17  . ED EKG within 10 minutes  . ED EKG within 10 minutes  . EKG    ASSESSMENT AND PLAN:   67 year old male with past medical history significant for A. fib on Xarelto, history of colon cancer status post right colectomy, locally advanced invasive bladder cancer, hypertension and diabetes mellitus presents to hospital secondary to abnormal labs [facility] at [facility].  1.  Acute renal failure-on CKD stage III, baseline creatinine around 2.2. - Secondary to obstructive uropathy - Patient has highly invasive bladder cancer, had hydronephrosis with nephrostomy tubes placed percutaneously and also status post ureteral stenting. - His nephrostomy tubes were capped. -His creatinine was increasing up to 7 and so sent to the hospital. -Nephrostomy tubes have been uncapped and draining clear amber-colored urine - creatinine now at 2.4 - appreciate Nephrology consult.  No indication for dialysis.   - Renal ultrasound showing worsening of his hydronephrosis likely from the nephrostomy tubes being capped. -Hyponatremia also improved  2.  Hyperkalemia- secondary to ARF and acidosis- treated With  lokelma and now normalized.   3.  Acute on chronic anemia-secondary to hematuria.  But hematuria has cleared up.  discontinue Xarelto - Hg. Stable post transfusion and will cont. To monitor.   4.  Acute cystitis- cultures growing VRE and E.coli - Patient is on keflex and Zyvox.  Treat for 7  days and stop  5.  Diabetes mellitus-appreciate diabetes coordinator's input - on Lantus and also sliding scale insulin for now. novolog premeal added - dose adjustments done due to hyperglycemia. Hba1c is 9.0  6.  DVT prophylaxis- SQ heparin Xarelto has been stopped.   7 A. fib-rate controlled.  On low-dose digoxin, amiodarone.  And metoprolol.  Xarelto on hold at discharge due to anemia  Discharge to short-term rehab once insurance authorization received.  All the records are reviewed and case discussed with Care Management/Social Workerr. Management plans discussed with the patient, family and they are in agreement.  CODE STATUS: Full Code  TOTAL TIME TAKING CARE OF THIS PATIENT: 30 minutes.   POSSIBLE D/C 1-2, DEPENDING ON CLINICAL CONDITION.   Henreitta Leber M.D on 11/09/2017 at 1:25 PM  Between 7am to 6pm - Pager - [phone]  After 6pm go to [email or web address] - password [email or web address] [facility]  Office  [phone]  CC: Primary care physician; Albina Billet, MD

## 2017-11-10 DIAGNOSIS — N179 Acute kidney failure, unspecified: Secondary | ICD-10-CM

## 2017-11-10 DIAGNOSIS — N131 Hydronephrosis with ureteral stricture, not elsewhere classified: Secondary | ICD-10-CM

## 2017-11-10 DIAGNOSIS — N390 Urinary tract infection, site not specified: Secondary | ICD-10-CM

## 2017-11-10 DIAGNOSIS — R8290 Unspecified abnormal findings in urine: Secondary | ICD-10-CM

## 2017-11-10 LAB — GLUCOSE, CAPILLARY
GLUCOSE-CAPILLARY: 254 mg/dL — AB (ref 70–99)
GLUCOSE-CAPILLARY: 216 mg/dL — AB (ref 70–99)
GLUCOSE-CAPILLARY: 217 mg/dL — AB (ref 70–99)
GLUCOSE-CAPILLARY: 295 mg/dL — AB (ref 70–99)
Glucose-Capillary: 229 mg/dL — ABNORMAL HIGH (ref 70–99)

## 2017-11-10 NOTE — Progress Notes (Signed)
[facility] [place] NAME: Jordan [name]    MR#:  [id number]  DATE OF BIRTH:  03/19/1950  SUBJECTIVE:   No other acute events overnight, right nephrostomy tube slightly cloudy but the left one is clear yellow urine.  Denies any abdominal pain nausea vomiting.  Awaiting insurance authorization for discharge to short-term rehab.  REVIEW OF SYSTEMS:  Review of Systems  Constitutional: Negative for chills, fever and malaise/fatigue.  HENT: Negative for congestion, ear discharge, hearing loss and nosebleeds.   Respiratory: Negative for cough, shortness of breath and wheezing.   Cardiovascular: Negative for chest pain, palpitations and leg swelling.  Gastrointestinal: Negative for abdominal pain, constipation, diarrhea, nausea and vomiting.  Genitourinary: Negative for dysuria.  Musculoskeletal: Negative for myalgias.  Neurological: Negative for dizziness, focal weakness, seizures, weakness and headaches.  Psychiatric/Behavioral: Negative for depression.    DRUG ALLERGIES:   Allergies  Allergen Reactions  . Amiodarone Other (See Comments)    Thyrotoxicosis per Murray Hodgkins, NP    VITALS:  Blood pressure 132/75, pulse 83, temperature 98.6 F (37 C), temperature source Oral, resp. rate 18, height 5\' 9"  (1.753 m), weight 70.8 kg, SpO2 97 %.  PHYSICAL EXAMINATION:  Physical Exam   GENERAL:  67 y.o.-year-old patient lying in the bed with no acute distress.  EYES: Pupils equal, round, reactive to light and accommodation. No scleral icterus. Extraocular muscles intact.  HEENT: Head atraumatic, normocephalic. Oropharynx and nasopharynx clear.  NECK:  Supple, no jugular venous distention. No thyroid enlargement, no tenderness.  LUNGS: Normal breath sounds bilaterally, no wheezing, rales,rhonchi or crepitation. No use of accessory muscles of respiration. Decreased bibasilar breath sounds CARDIOVASCULAR: S1, S2 normal. No murmurs, rubs, or  gallops.  ABDOMEN: Soft, nontender, nondistended. Bowel sounds present. No organomegaly or mass.  - bilateral nephrostomy tubes in place EXTREMITIES: No pedal edema, cyanosis, or clubbing.  NEUROLOGIC: Cranial nerves II through XII are intact. No focal motor or sensory deficits appreciated bilaterally, globally weak.   PSYCHIATRIC: The patient is alert and oriented to self  SKIN: No obvious rash, lesion, or ulcer.    LABORATORY PANEL:   CBC Recent Labs  Lab 11/07/17 0326  WBC 9.5  HGB 8.5*  HCT 24.2*  PLT 628*   ------------------------------------------------------------------------------------------------------------------  Chemistries  Recent Labs  Lab 11/04/17 2007  11/09/17 1004  NA 127*   < > 139  K 4.6   < > 3.6  CL 98   < > 107  CO2 17*   < > 22  GLUCOSE 228*   < > 179*  BUN 70*   < > 21  CREATININE 7.89*   < > 2.24*  CALCIUM 8.2*   < > 8.2*  AST 14*  --   --   ALT 12  --   --   ALKPHOS 99  --   --   BILITOT 0.5  --   --    < > = values in this interval not displayed.   ------------------------------------------------------------------------------------------------------------------  Cardiac Enzymes No results for input(s): TROPONINI in the last 168 hours. ------------------------------------------------------------------------------------------------------------------  RADIOLOGY:  No results found.  EKG:   Orders placed or performed during the hospital encounter of 10/25/17  . ED EKG within 10 minutes  . ED EKG within 10 minutes  . EKG    ASSESSMENT AND PLAN:   67 year old male with past medical history significant for A. fib on Xarelto, history of colon cancer status post right colectomy, locally advanced invasive  bladder cancer, hypertension and diabetes mellitus presents to hospital secondary to abnormal labs [facility] at [facility].  1.  Acute renal failure-on CKD stage III, baseline creatinine around 2.2. - Secondary to obstructive  uropathy - Patient had highly invasive bladder cancer, had hydronephrosis with nephrostomy tubes placed percutaneously and also status post ureteral stenting. - His nephrostomy tubes were capped and then His creatinine increased to 7 and so sent to the hospital. -Nephrostomy tubes have been uncapped and draining yellow urine now.  Nephrostomy tubes have also been exchanged by IR as of 11/08/17 - creatinine now at 2.2 and at baseline. - appreciate Nephrology consult.  No indication for dialysis.    2.  Hyperkalemia- secondary to ARF and acidosis- treated With  lokelma and now normalized.   3.  Acute on chronic anemia-secondary to hematuria.  But hematuria has cleared up.  discontinue Xarelto - Hg. Stable post transfusion and will cont. To monitor.   4.  Acute cystitis- cultures growing VRE and E.coli - Patient is on keflex and Zyvox.  Treat for 7 days and stop  5.  Diabetes mellitus-appreciate diabetes coordinator's input - on Lantus, novolog with meals and SSI and will cont. To follow BS.   - dose adjustments done due to hyperglycemia. Hba1c is 9.0  6.  DVT prophylaxis- SQ heparin Xarelto has been stopped.   7 A. fib-rate controlled.  On low-dose digoxin, amiodarone, metoprolol.  Xarelto on hold at discharge due to anemia  Discharge to short-term rehab once insurance authorization received.  All the records are reviewed and case discussed with Care Management/Social Workerr. Management plans discussed with the patient, family and they are in agreement.  CODE STATUS: Full Code  TOTAL TIME TAKING CARE OF THIS PATIENT: 30 minutes.   POSSIBLE D/C 1-2, DEPENDING ON CLINICAL CONDITION.   Henreitta Leber M.D on 11/10/2017 at 11:35 AM  Between 7am to 6pm - Pager - [phone]  After 6pm go to [email or web address] - password [email or web address] [facility]  Office  [phone]  CC: Primary care physician; Albina Billet, MD

## 2017-11-10 NOTE — Progress Notes (Signed)
[facility]  ROUNDING NOTE   Subjective:  Patient continues to have good urine output. 3.9 L from the nephrostomies yesterday. No new creatinine today.   Objective:  Vital signs in last 24 hours:  Temp:  [97.5 F (36.4 C)-98.6 F (37 C)] 98.6 F (37 C) (10/06 1021) Pulse Rate:  [71-83] 83 (10/06 1021) Resp:  [16-20] 18 (10/06 1021) BP: (125-156)/(73-81) 132/75 (10/06 1021) SpO2:  [97 %-99 %] 97 % (10/06 1021) Weight:  [70.8 kg] 70.8 kg (10/06 0500)  Weight change: -0.816 kg Filed Weights   11/08/17 0500 11/09/17 0438 11/10/17 0500  Weight: 72.3 kg 71.6 kg 70.8 kg    Intake/Output: I/O last 3 completed shifts: In: 4434.1 [P.O.:650; I.V.:3784.1] Out: 3846 [Urine:7365]   Intake/Output this shift:  Total I/O In: -  Out: 550 [Urine:550]  Physical Exam: General: No acute distress  Head: Normocephalic, atraumatic. Moist oral mucosal membranes  Eyes: Anicteric  Neck: Supple, trachea midline  Lungs:  Clear to auscultation, normal effort  Heart: S1S2 no rubs  Abdomen:  Soft, nontender, bowel sounds present, bilateral nephrostomies in place  Extremities: trace peripheral edema.  Neurologic: Awake, alert, following commands  Skin: No lesions       Basic Metabolic Panel: Recent Labs  Lab 11/05/17 0611 11/06/17 0415 11/06/17 0759 11/06/17 1412 11/07/17 0326 11/08/17 0518 11/09/17 1004  NA 133* 139  --   --  142 144 139  K 4.7 5.7*  --  4.3 3.7 4.4 3.6  CL 107 112*  --   --  110 112* 107  CO2 17* 19*  --   --  22 24 22   GLUCOSE 232* 329* 371*  --  135* 172* 179*  BUN 59* 55*  --   --  44* 27* 21  CREATININE 6.86* 4.77*  --   --  3.16* 2.61* 2.24*  CALCIUM 8.1* 8.6*  --   --  8.3* 8.4* 8.2*    Liver Function Tests: Recent Labs  Lab 11/04/17 2007  AST 14*  ALT 12  ALKPHOS 99  BILITOT 0.5  PROT 6.9  ALBUMIN 2.5*   No results for input(s): LIPASE, AMYLASE in the last 168 hours. No results for input(s): AMMONIA in the last 168  hours.  CBC: Recent Labs  Lab 11/04/17 2007 11/05/17 0611 11/06/17 0415 11/07/17 0326  WBC 12.4* 10.6 9.1 9.5  NEUTROABS 10.2*  --   --   --   HGB 7.6* 7.2* 8.8* 8.5*  HCT 22.1* 20.6* 25.2* 24.2*  MCV 85.5 86.9 86.4 86.4  PLT 606* 573* 659* 628*    Cardiac Enzymes: No results for input(s): CKTOTAL, CKMB, CKMBINDEX, TROPONINI in the last 168 hours.  BNP: Invalid input(s): POCBNP  CBG: Recent Labs  Lab 11/09/17 1143 11/09/17 1626 11/09/17 2024 11/10/17 0822 11/10/17 1148  GLUCAP 147* 175* 286* 254* 216*    Microbiology: Results for orders placed or performed during the hospital encounter of 11/04/17  Urine Culture     Status: Abnormal   Collection Time: 11/04/17  7:36 PM  Result Value Ref Range Status   Specimen Description   Final    URINE, RANDOM Performed at [facility], [address] [place], [place] [address]    Special Requests   Final    NONE Performed at [facility], [address] [place], [place] [address]    Culture (A)  Final    >=100,000 COLONIES/mL ESCHERICHIA COLI >=100,000 COLONIES/mL VANCOMYCIN RESISTANT ENTEROCOCCUS ISOLATED    Report Status 11/07/2017  FINAL  Final   Organism ID, Bacteria ESCHERICHIA COLI (A)  Final   Organism ID, Bacteria VANCOMYCIN RESISTANT ENTEROCOCCUS ISOLATED (A)  Final      Susceptibility   Escherichia coli - MIC*    AMPICILLIN >=32 RESISTANT Resistant     CEFAZOLIN <=4 SENSITIVE Sensitive     CEFTRIAXONE <=1 SENSITIVE Sensitive     CIPROFLOXACIN <=0.25 SENSITIVE Sensitive     GENTAMICIN <=1 SENSITIVE Sensitive     IMIPENEM <=0.25 SENSITIVE Sensitive     NITROFURANTOIN <=16 SENSITIVE Sensitive     TRIMETH/SULFA >=320 RESISTANT Resistant     AMPICILLIN/SULBACTAM 16 INTERMEDIATE Intermediate     PIP/TAZO <=4 SENSITIVE Sensitive     Extended ESBL NEGATIVE Sensitive     * >=100,000 COLONIES/mL ESCHERICHIA COLI   Vancomycin resistant enterococcus isolated - MIC*    AMPICILLIN >=32  RESISTANT Resistant     LEVOFLOXACIN >=8 RESISTANT Resistant     NITROFURANTOIN 256 RESISTANT Resistant     VANCOMYCIN >=32 RESISTANT Resistant     LINEZOLID 2 SENSITIVE Sensitive     * >=100,000 COLONIES/mL VANCOMYCIN RESISTANT ENTEROCOCCUS ISOLATED  MRSA PCR Screening     Status: None   Collection Time: 11/06/17 10:47 PM  Result Value Ref Range Status   MRSA by PCR NEGATIVE NEGATIVE Final    Comment:        The GeneXpert MRSA Assay (FDA approved for NASAL specimens only), is one component of a comprehensive MRSA colonization surveillance program. It is not intended to diagnose MRSA infection nor to guide or monitor treatment for MRSA infections. Performed at [facility], [address] [place], [place] [address]   Surgical PCR screen     Status: None   Collection Time: 11/06/17 10:47 PM  Result Value Ref Range Status   MRSA, PCR NEGATIVE NEGATIVE Final   Staphylococcus aureus NEGATIVE NEGATIVE Final    Comment: (NOTE) The Xpert SA Assay (FDA approved for NASAL specimens in patients 63 years of age and older), is one component of a comprehensive surveillance program. It is not intended to diagnose infection nor to guide or monitor treatment. Performed at [facility], [address] [place], [place] [address]     Coagulation Studies: No results for input(s): LABPROT, INR in the last 72 hours.  Urinalysis: No results for input(s): COLORURINE, LABSPEC, PHURINE, GLUCOSEU, HGBUR, BILIRUBINUR, KETONESUR, PROTEINUR, UROBILINOGEN, NITRITE, LEUKOCYTESUR in the last 72 hours.  Invalid input(s): APPERANCEUR    Imaging: No results found.   Medications:    . albuterol  10 mg Nebulization Once  . amiodarone  200 mg Oral Daily  . amLODipine  10 mg Oral Daily  . cephALEXin  250 mg Oral Q8H  . dextrose  1 ampule Intravenous Once  . digoxin  0.0625 mg Oral Daily  . docusate sodium  100 mg Oral BID  . famotidine  20 mg Oral Daily  . folic acid  1 mg  Oral Daily  . heparin injection (subcutaneous)  5,000 Units Subcutaneous Q8H  . insulin aspart  0-15 Units Subcutaneous TID WC  . insulin aspart  0-5 Units Subcutaneous QHS  . insulin aspart  6 Units Subcutaneous TID WC  . insulin glargine  25 Units Subcutaneous Daily  . linezolid  600 mg Oral Q12H  . loratadine  10 mg Oral Daily  . metoprolol tartrate  25 mg Oral BID  . multivitamin with minerals  1 tablet Oral Daily  . protein supplement shake  11 oz Oral BID BM  .  rosuvastatin  5 mg Oral Daily  . tamsulosin  0.4 mg Oral QPC supper  . ascorbic acid  250 mg Oral BID   acetaminophen **OR** acetaminophen, bisacodyl, HYDROcodone-acetaminophen, lidocaine-prilocaine, ondansetron **OR** ondansetron (ZOFRAN) IV, traZODone  Assessment/ Plan:  67 y.o. 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    1.  Acute renal failure secondary to obstructive uropathy after nephrostomies capped.  2.  Hyponatremia, imrpoved.  3.  Metabolic acidosis secondary to hydronephrosis. 4.  Altered mental status secondary to uremia. 5.  Hyperkalemia, resolved.   Plan: No new creatinine available today.  However suspect that it will continue to decline slowly.  Recommend checking renal function periodically.  Hyponatremia has been corrected.  Metabolic acidosis is also corrected with the relief of obstruction.  Patient awaiting placement in rehabilitation center.    LOS: 6 Jozy Mcphearson 10/6/201912:00 PM

## 2017-11-10 NOTE — Progress Notes (Addendum)
Pt Right and Left nephrostomy tube had clear  yellow/straw color drainage at the beginning of shift. Drainage from Right nephrostomy is cloudy/milky with a slight pink tinge to it. MD aware. Dressing is clean, dry, intact.VS stable. No temp at this time.  Will continue to monitor.

## 2017-11-10 NOTE — Progress Notes (Signed)
3 Days Post-Op   Consultation: cloudy urine Requested by: Norva Riffle. Jordan [name]  Subjective: Patient reports he is feeling well.  Urology reconsulted for cloudy urine of the right nephrostomy tube.  Objective: Vital signs in last 24 hours: Temp:  [97.5 F (36.4 C)-98.6 F (37 C)] 98.6 F (37 C) (10/06 1021) Pulse Rate:  [71-83] 83 (10/06 1021) Resp:  [16-20] 18 (10/06 1021) BP: (125-156)/(73-81) 132/75 (10/06 1021) SpO2:  [97 %-99 %] 97 % (10/06 1021) Weight:  [70.8 kg] 70.8 kg (10/06 0500)  Intake/Output from previous day: 10/05 0701 - 10/06 0700 In: 1034.1 [P.O.:650; I.V.:384.1] Out: 6283 [Urine:4350] Intake/Output this shift: Total I/O In: -  Out: 550 [Urine:550]  Physical Exam:  He looks well and has just finished lunch He is watching track and field Abdomen is soft and nontender Both nephrostomy tube bags are full of urine, about 500 cc each.  Left side clear, right side cloudy.  Lab Results: No results for input(s): HGB, HCT in the last 72 hours. BMET Recent Labs    11/08/17 0518 11/09/17 1004  NA 144 139  K 4.4 3.6  CL 112* 107  CO2 24 22  GLUCOSE 172* 179*  BUN 27* 21  CREATININE 2.61* 2.24*  CALCIUM 8.4* 8.2*   No results for input(s): LABPT, INR in the last 72 hours. No results for input(s): LABURIN in the last 72 hours. Results for orders placed or performed during the hospital encounter of 11/04/17  Urine Culture     Status: Abnormal   Collection Time: 11/04/17  7:36 PM  Result Value Ref Range Status   Specimen Description   Final    URINE, RANDOM Performed at [facility], [address], [place], [place] [address]    Special Requests   Final    NONE Performed at [facility], [address] [place], [place] [address]    Culture (A)  Final    >=100,000 COLONIES/mL ESCHERICHIA COLI >=100,000 COLONIES/mL VANCOMYCIN RESISTANT ENTEROCOCCUS ISOLATED    Report Status 11/07/2017 FINAL  Final   Organism ID, Bacteria  ESCHERICHIA COLI (A)  Final   Organism ID, Bacteria VANCOMYCIN RESISTANT ENTEROCOCCUS ISOLATED (A)  Final      Susceptibility   Escherichia coli - MIC*    AMPICILLIN >=32 RESISTANT Resistant     CEFAZOLIN <=4 SENSITIVE Sensitive     CEFTRIAXONE <=1 SENSITIVE Sensitive     CIPROFLOXACIN <=0.25 SENSITIVE Sensitive     GENTAMICIN <=1 SENSITIVE Sensitive     IMIPENEM <=0.25 SENSITIVE Sensitive     NITROFURANTOIN <=16 SENSITIVE Sensitive     TRIMETH/SULFA >=320 RESISTANT Resistant     AMPICILLIN/SULBACTAM 16 INTERMEDIATE Intermediate     PIP/TAZO <=4 SENSITIVE Sensitive     Extended ESBL NEGATIVE Sensitive     * >=100,000 COLONIES/mL ESCHERICHIA COLI   Vancomycin resistant enterococcus isolated - MIC*    AMPICILLIN >=32 RESISTANT Resistant     LEVOFLOXACIN >=8 RESISTANT Resistant     NITROFURANTOIN 256 RESISTANT Resistant     VANCOMYCIN >=32 RESISTANT Resistant     LINEZOLID 2 SENSITIVE Sensitive     * >=100,000 COLONIES/mL VANCOMYCIN RESISTANT ENTEROCOCCUS ISOLATED  MRSA PCR Screening     Status: None   Collection Time: 11/06/17 10:47 PM  Result Value Ref Range Status   MRSA by PCR NEGATIVE NEGATIVE Final    Comment:        The GeneXpert MRSA Assay (FDA approved for NASAL specimens only), is one component of a comprehensive MRSA colonization  surveillance program. It is not intended to diagnose MRSA infection nor to guide or monitor treatment for MRSA infections. Performed at [facility], [address] [place], [place] [address]   Surgical PCR screen     Status: None   Collection Time: 11/06/17 10:47 PM  Result Value Ref Range Status   MRSA, PCR NEGATIVE NEGATIVE Final   Staphylococcus aureus NEGATIVE NEGATIVE Final    Comment: (NOTE) The Xpert SA Assay (FDA approved for NASAL specimens in patients 60 years of age and older), is one component of a comprehensive surveillance program. It is not intended to diagnose infection nor to guide or monitor  treatment. Performed at [facility], [address] [place], [place] [address]     Studies/Results: No results found.I reviewed the IR images from 10.4.   Assessment/Plan:  Bilateral ureteral obstruction -failed ureteral stents, Nx tubes opened back to gravity drainage, Cr continues to improve - 2.24 yesteday. S/p bilateral nx exchange 10/4.   Locally advanced bladder cancer - arrange for outpatient follow-up in about 2 months after chemo/ rads to discuss re-TUR and stent removal.    Cloudy urine (R82.90) - this is likely of no clinical significant, but I recommend you send a UA and urine cx in the event patient becomes symptomatic (fever) so that cx data will be available (although cx's will likely be sent at that time, too).   Will sign off again.  Please notify GU with any questions, concerns or change in patient status.   LOS: 6 days   Festus Aloe 11/10/2017, 1:37 PM

## 2017-11-11 ENCOUNTER — Other Ambulatory Visit: Payer: Self-pay | Admitting: Genetic Counselor

## 2017-11-11 ENCOUNTER — Ambulatory Visit: Payer: Medicare Other

## 2017-11-11 DIAGNOSIS — C689 Malignant neoplasm of urinary organ, unspecified: Secondary | ICD-10-CM

## 2017-11-11 LAB — BASIC METABOLIC PANEL
Anion gap: 8 (ref 5–15)
BUN: 23 mg/dL (ref 8–23)
CALCIUM: 8.6 mg/dL — AB (ref 8.9–10.3)
CO2: 26 mmol/L (ref 22–32)
CREATININE: 2.21 mg/dL — AB (ref 0.61–1.24)
Chloride: 102 mmol/L (ref 98–111)
GFR calc non Af Amer: 29 mL/min — ABNORMAL LOW (ref >60)
GFR, EST AFRICAN AMERICAN: 34 mL/min — AB (ref >60)
Glucose, Bld: 226 mg/dL — ABNORMAL HIGH (ref 70–99)
Potassium: 3.9 mmol/L (ref 3.5–5.1)
SODIUM: 136 mmol/L (ref 135–145)

## 2017-11-11 LAB — GLUCOSE, CAPILLARY
GLUCOSE-CAPILLARY: 276 mg/dL — AB (ref 70–99)
GLUCOSE-CAPILLARY: 226 mg/dL — AB (ref 70–99)

## 2017-11-11 NOTE — Clinical Social Work Note (Signed)
Patient is medically ready for discharge today. CSW spoke with Otila Kluver at [employer] and they have obtained [facility] authorization. CSW spoke with patient and sister Basilia Jumbo [phone]. Patient and sister in agreement with discharge today. Patient will be transported by EMS. RN to call report and call for transport.   [place], [place]

## 2017-11-11 NOTE — Care Management Important Message (Signed)
Important Message  Patient Details  Name: Jordan [name] MRN: [id number] Date of Birth: 1951-01-21   Medicare Important Message Given:  Yes    Shelbie Ammons, RN 11/11/2017, 1:32 PM

## 2017-11-11 NOTE — Progress Notes (Signed)
[facility]  ROUNDING NOTE   Subjective:   Patient laying in bed. No complaints.   UOP 2050  Objective:  Vital signs in last 24 hours:  Temp:  [98 F (36.7 C)-98.4 F (36.9 C)] 98 F (36.7 C) (10/07 1355) Pulse Rate:  [67-77] 72 (10/07 1355) Resp:  [17-18] 18 (10/07 1355) BP: (135-148)/(69-94) 135/69 (10/07 1355) SpO2:  [97 %-99 %] 97 % (10/07 1355) Weight:  [70 kg] 70 kg (10/07 0411)  Weight change: -0.771 kg Filed Weights   11/09/17 0438 11/10/17 0500 11/11/17 0411  Weight: 71.6 kg 70.8 kg 70 kg    Intake/Output: I/O last 3 completed shifts: In: -  Out: 4650 [Urine:4650]   Intake/Output this shift:  Total I/O In: 0  Out: 750 [Urine:750]  Physical Exam: General: No acute distress  Head: Normocephalic, atraumatic. Moist oral mucosal membranes  Eyes: Anicteric  Neck: Supple, trachea midline  Lungs:  Clear to auscultation, normal effort  Heart: S1S2 no rubs  Abdomen:   bilateral nephrostomies tubes  Extremities: No peripheral edema.  Neurologic: Awake, alert, following commands  Skin: No lesions       Basic Metabolic Panel: Recent Labs  Lab 11/06/17 0415 11/06/17 0759 11/06/17 1412 11/07/17 0326 11/08/17 0518 11/09/17 1004 11/11/17 1228  NA 139  --   --  142 144 139 136  K 5.7*  --  4.3 3.7 4.4 3.6 3.9  CL 112*  --   --  110 112* 107 102  CO2 19*  --   --  22 24 22 26   GLUCOSE 329* 371*  --  135* 172* 179* 226*  BUN 55*  --   --  44* 27* 21 23  CREATININE 4.77*  --   --  3.16* 2.61* 2.24* 2.21*  CALCIUM 8.6*  --   --  8.3* 8.4* 8.2* 8.6*    Liver Function Tests: Recent Labs  Lab 11/04/17 2007  AST 14*  ALT 12  ALKPHOS 99  BILITOT 0.5  PROT 6.9  ALBUMIN 2.5*   No results for input(s): LIPASE, AMYLASE in the last 168 hours. No results for input(s): AMMONIA in the last 168 hours.  CBC: Recent Labs  Lab 11/04/17 2007 11/05/17 0611 11/06/17 0415 11/07/17 0326  WBC 12.4* 10.6 9.1 9.5  NEUTROABS 10.2*  --   --   --    HGB 7.6* 7.2* 8.8* 8.5*  HCT 22.1* 20.6* 25.2* 24.2*  MCV 85.5 86.9 86.4 86.4  PLT 606* 573* 659* 628*    Cardiac Enzymes: No results for input(s): CKTOTAL, CKMB, CKMBINDEX, TROPONINI in the last 168 hours.  BNP: Invalid input(s): POCBNP  CBG: Recent Labs  Lab 11/10/17 1322 11/10/17 1647 11/10/17 2112 11/11/17 0722 11/11/17 1201  GLUCAP 217* 229* 295* 276* 226*    Microbiology: Results for orders placed or performed during the hospital encounter of 11/04/17  Urine Culture     Status: Abnormal   Collection Time: 11/04/17  7:36 PM  Result Value Ref Range Status   Specimen Description   Final    URINE, RANDOM Performed at [facility], [address], [place], [place] [address]    Special Requests   Final    NONE Performed at [facility], [address] [place],  [address]    Culture (A)  Final    >=100,000 COLONIES/mL ESCHERICHIA COLI >=100,000 COLONIES/mL VANCOMYCIN RESISTANT ENTEROCOCCUS ISOLATED    Report Status 11/07/2017 FINAL  Final   Organism ID, Bacteria ESCHERICHIA COLI (A)  Final  Organism ID, Bacteria VANCOMYCIN RESISTANT ENTEROCOCCUS ISOLATED (A)  Final      Susceptibility   Escherichia coli - MIC*    AMPICILLIN >=32 RESISTANT Resistant     CEFAZOLIN <=4 SENSITIVE Sensitive     CEFTRIAXONE <=1 SENSITIVE Sensitive     CIPROFLOXACIN <=0.25 SENSITIVE Sensitive     GENTAMICIN <=1 SENSITIVE Sensitive     IMIPENEM <=0.25 SENSITIVE Sensitive     NITROFURANTOIN <=16 SENSITIVE Sensitive     TRIMETH/SULFA >=320 RESISTANT Resistant     AMPICILLIN/SULBACTAM 16 INTERMEDIATE Intermediate     PIP/TAZO <=4 SENSITIVE Sensitive     Extended ESBL NEGATIVE Sensitive     * >=100,000 COLONIES/mL ESCHERICHIA COLI   Vancomycin resistant enterococcus isolated - MIC*    AMPICILLIN >=32 RESISTANT Resistant     LEVOFLOXACIN >=8 RESISTANT Resistant     NITROFURANTOIN 256 RESISTANT Resistant     VANCOMYCIN >=32 RESISTANT Resistant      LINEZOLID 2 SENSITIVE Sensitive     * >=100,000 COLONIES/mL VANCOMYCIN RESISTANT ENTEROCOCCUS ISOLATED  MRSA PCR Screening     Status: None   Collection Time: 11/06/17 10:47 PM  Result Value Ref Range Status   MRSA by PCR NEGATIVE NEGATIVE Final    Comment:        The GeneXpert MRSA Assay (FDA approved for NASAL specimens only), is one component of a comprehensive MRSA colonization surveillance program. It is not intended to diagnose MRSA infection nor to guide or monitor treatment for MRSA infections. Performed at [facility], [address] [place], [place] [address]   Surgical PCR screen     Status: None   Collection Time: 11/06/17 10:47 PM  Result Value Ref Range Status   MRSA, PCR NEGATIVE NEGATIVE Final   Staphylococcus aureus NEGATIVE NEGATIVE Final    Comment: (NOTE) The Xpert SA Assay (FDA approved for NASAL specimens in patients 2 years of age and older), is one component of a comprehensive surveillance program. It is not intended to diagnose infection nor to guide or monitor treatment. Performed at [facility], [address] [place], [place] [address]     Coagulation Studies: No results for input(s): LABPROT, INR in the last 72 hours.  Urinalysis: No results for input(s): COLORURINE, LABSPEC, PHURINE, GLUCOSEU, HGBUR, BILIRUBINUR, KETONESUR, PROTEINUR, UROBILINOGEN, NITRITE, LEUKOCYTESUR in the last 72 hours.  Invalid input(s): APPERANCEUR    Imaging: No results found.   Medications:    . albuterol  10 mg Nebulization Once  . amiodarone  200 mg Oral Daily  . amLODipine  10 mg Oral Daily  . cephALEXin  250 mg Oral Q8H  . dextrose  1 ampule Intravenous Once  . digoxin  0.0625 mg Oral Daily  . docusate sodium  100 mg Oral BID  . famotidine  20 mg Oral Daily  . folic acid  1 mg Oral Daily  . heparin injection (subcutaneous)  5,000 Units Subcutaneous Q8H  . insulin aspart  0-15 Units Subcutaneous TID WC  . insulin aspart  0-5  Units Subcutaneous QHS  . insulin aspart  6 Units Subcutaneous TID WC  . insulin glargine  25 Units Subcutaneous Daily  . linezolid  600 mg Oral Q12H  . loratadine  10 mg Oral Daily  . metoprolol tartrate  25 mg Oral BID  . multivitamin with minerals  1 tablet Oral Daily  . protein supplement shake  11 oz Oral BID BM  . rosuvastatin  5 mg Oral Daily  . tamsulosin  0.4 mg Oral  QPC supper  . ascorbic acid  250 mg Oral BID   acetaminophen **OR** acetaminophen, bisacodyl, HYDROcodone-acetaminophen, lidocaine-prilocaine, ondansetron **OR** ondansetron (ZOFRAN) IV, traZODone  Assessment/ Plan:  67 y.o. 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    1.  Acute renal failure secondary to obstructive uropathy after nephrostomies capped.  2.  Hyponatremia, improved.  3.  Metabolic acidosis secondary to hydronephrosis. 4.  Altered mental status secondary to uremia. 5.  Hyperkalemia, resolved.    Hospital Follow Up with Dr. Holley Raring on 10/30 at 2:40pm.    LOS: 7 Anvitha Hutmacher 10/7/20193:04 PM

## 2017-11-11 NOTE — Discharge Summary (Signed)
[facility] NAME: Jordan [name]    MR#:  [id number]  DATE OF BIRTH:  11-14-1950  DATE OF ADMISSION:  11/04/2017 ADMITTING PHYSICIAN: Amelia Jo, MD  DATE OF DISCHARGE: 11/11/2017  PRIMARY CARE PHYSICIAN: Albina Billet, MD    ADMISSION DIAGNOSIS:  Gross hematuria [I71.2] Complicated UTI (urinary tract infection) [N39.0] Acute kidney injury superimposed on chronic kidney disease ([place]) [N17.9, N18.9] Acute on chronic anemia [D64.9]  DISCHARGE DIAGNOSIS:  Active Problems:   Acute UTI   SECONDARY DIAGNOSIS:   Past Medical History:  Diagnosis Date  . Atrial fibrillation ([place])   . Cancer ([place])    a. 06/2011 b. s/p right hemicolectomy/ colon CA  . Colon polyps   . Dysrhythmia   . Family history of cancer   . Hypertension   . Hyperthyroidism    a. 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 Nursing home resident   . Stroke ([place]) 03/2012   a. residual right sided weakness  . Type 1 diabetes ([place])    a. on insulin b. prior DKA     HOSPITAL COURSE:   67 year old male with past medical history significant for A. fib on Xarelto, history of colon cancer status post right colectomy, locally advanced invasive bladder cancer, hypertension and diabetes mellitus presents to hospital secondary to abnormal labs noted at [facility].  1.  Acute renal failure-on CKD stage III, baseline creatinine around 2.2. This was secondary to obstructive uropathy and also patient having a highly invasive bladder cancer. - Patient had a previous history of ureteral stent placements and presented with bilateral hydroureteronephrosis secondary to the patient's stents being occluded.  Patient's stents were uncapped, and patient was aggressively hydrated and the creatinine has improved. - Patient also had his nephrostomy tubes changed out as of November 08, 2017. - Patient's creatinine has continued to trend down and he is urinating well  using the nephrostomy tubes. - He will be discharged to a skilled nursing facility and have outpatient follow-up with urology in a couple weeks. -Patient was seen by nephrology while in the hospital but had no acute indication for hemodialysis as his renal function improved with supportive care, IV fluids and uncapping of his nephrostomy tubes. - pt. Will cont. His Flomax.   2.  Hyperkalemia- secondary to ARF and acidosis- treated With  lokelma and now normalized.   3.  Acute on chronic anemia-secondary to hematuria.  But hematuria has cleared up.  -Patient Xarelto was being held but will resume that upon discharge now. Patient's hemoglobin has remained stable and can be further followed up as an outpatient.  4.  Acute cystitis- cultures growing VRE and E.coli -Patient has been switched over to oral Keflex and Zyvox and will finish his treatment.  Patient is to be treated for total of 7 days.  5.  Diabetes mellitus-patient's blood sugars were somewhat uncontrolled, diabetes coordinator consult was obtained.  Patient's A1c is 9.  At present he will continue his Lantus, NovoLog with meals and sliding scale insulin.  Further changes to his diabetic regimen can be done as an outpatient.  6. A. fib-rate controlled.  he will continue his amiodarone, digoxin and low-dose metoprolol. -Patient's Xarelto was being held due to anemia but he has had no further hematuria and has improved therefore Xarelto is being resumed upon discharge.  He should have serial hemoglobins checked as an outpatient.  7.  Hyperlipidemia-patient will continue his  Crestor   DISCHARGE CONDITIONS:   Stable.   CONSULTS OBTAINED:  Treatment Team:  Anthonette Legato, MD Abbie Sons, MD Festus Aloe, MD  DRUG ALLERGIES:   Allergies  Allergen Reactions  . Amiodarone Other (See Comments)    Thyrotoxicosis per Murray Hodgkins, NP    DISCHARGE MEDICATIONS:   Allergies as of 11/11/2017      Reactions    Amiodarone Other (See Comments)   Thyrotoxicosis per Murray Hodgkins, NP      Medication List    TAKE these medications   acetaminophen 325 MG tablet Commonly known as:  TYLENOL Take 2 tablets (650 mg total) by mouth every 6 (six) hours as needed for mild pain (or Fever >/= 101).   amiodarone 200 MG tablet Commonly known as:  PACERONE Take 200 mg by mouth daily.   amLODipine 10 MG tablet Commonly known as:  NORVASC Take 10 mg by mouth daily.   ascorbic acid 250 MG tablet Commonly known as:  VITAMIN C Take 1 tablet (250 mg total) by mouth 2 (two) times daily.   atorvastatin 40 MG tablet Commonly known as:  LIPITOR Take 40 mg by mouth daily.   cephALEXin 250 MG capsule Commonly known as:  KEFLEX Take 1 capsule (250 mg total) by mouth every 8 (eight) hours for 6 days. What changed:    medication strength  how much to take  when to take this   Digoxin 62.5 MCG Tabs Take 0.0625 mg by mouth daily.   docusate sodium 100 MG capsule Commonly known as:  COLACE Take 1 capsule (100 mg total) by mouth 2 (two) times daily.   famotidine 20 MG tablet Commonly known as:  PEPCID Take 1 tablet (20 mg total) by mouth daily.   folic acid 1 MG tablet Commonly known as:  FOLVITE Take 1 mg by mouth daily.   HYDROcodone-acetaminophen 5-325 MG tablet Commonly known as:  NORCO/VICODIN Take 1-2 tablets by mouth every 6 (six) hours as needed for moderate pain.   insulin aspart 100 UNIT/ML injection Commonly known as:  novoLOG Inject 10 Units into the skin 3 (three) times daily with meals. What changed:  how much to take   insulin aspart 100 UNIT/ML injection Commonly known as:  novoLOG Inject 0-15 Units into the skin 4 (four) times daily -  before meals and at bedtime. CBG < 70: implement hypoglycemia protocol CBG 70 - 120: 0 units CBG 121 - 150: 2 units CBG 151 - 200: 3 units CBG 201 - 250: 5 units CBG 251 - 300: 8 units CBG 301 - 350: 11 units CBG 351 - 400: 15  units CBG > 400: call MD and obtain STAT lab verification What changed:  Another medication with the same name was changed. Make sure you understand how and when to take each.   insulin glargine 100 UNIT/ML injection Commonly known as:  LANTUS Inject 0.37 mLs (37 Units total) into the skin daily.   lidocaine-prilocaine cream Commonly known as:  EMLA Apply to port-a-cath prior to accessing What changed:  additional instructions   linezolid 600 MG tablet Commonly known as:  ZYVOX Take 1 tablet (600 mg total) by mouth every 12 (twelve) hours for 6 days.   loratadine 10 MG tablet Commonly known as:  CLARITIN Take 1 tablet (10 mg total) by mouth daily.   metoprolol tartrate 25 MG tablet Commonly known as:  LOPRESSOR Take 1 tablet (25 mg total) by mouth 2 (two) times daily.   multivitamin with minerals  Tabs tablet Take 1 tablet by mouth daily.   MYLANTA GAS PO Take by mouth.   ondansetron 8 MG tablet Commonly known as:  ZOFRAN Take 1 tablet (8 mg total) by mouth 2 (two) times daily as needed (Nausea or vomiting).   oxybutynin 5 MG tablet Commonly known as:  DITROPAN Take 1 tablet (5 mg total) by mouth every 8 (eight) hours as needed for bladder spasms.   polyethylene glycol packet Commonly known as:  MIRALAX / GLYCOLAX Take 17 g by mouth daily.   prochlorperazine 10 MG tablet Commonly known as:  COMPAZINE Take 1 tablet (10 mg total) by mouth every 6 (six) hours as needed (Nausea or vomiting).   protein supplement shake Liqd Commonly known as:  PREMIER PROTEIN Take 325 mLs (11 oz total) by mouth 2 (two) times daily between meals.   Rivaroxaban 15 MG Tabs tablet Commonly known as:  XARELTO Take 15 mg by mouth daily.   rosuvastatin 5 MG tablet Commonly known as:  CRESTOR Take 5 mg by mouth daily.   senna-docusate 8.6-50 MG tablet Commonly known as:  Senokot-S Take 1 tablet by mouth daily.   tamsulosin 0.4 MG Caps capsule Commonly known as:  FLOMAX Take 1  capsule (0.4 mg total) by mouth daily after supper.   traZODone 50 MG tablet Commonly known as:  DESYREL Take 50 mg by mouth at bedtime.         DISCHARGE INSTRUCTIONS:   DIET:  Cardiac diet and Diabetic diet  DISCHARGE CONDITION:  Stable  ACTIVITY:  Activity as tolerated  OXYGEN:  Home Oxygen: No.   Oxygen Delivery: room air  DISCHARGE LOCATION:  nursing home   If you experience worsening of your admission symptoms, develop shortness of breath, life threatening emergency, suicidal or homicidal thoughts you must seek medical attention immediately by calling 911 or calling your MD immediately  if symptoms less severe.  You Must read complete instructions/literature along with all the possible adverse reactions/side effects for all the Medicines you take and that have been prescribed to you. Take any new Medicines after you have completely understood and accpet all the possible adverse reactions/side effects.   Please note  You were cared for by a hospitalist during your hospital stay. If you have any questions about your discharge medications or the care you received while you were in the hospital after you are discharged, you can call the unit and asked to speak with the hospitalist on call if the hospitalist that took care of you is not available. Once you are discharged, your primary care physician will handle any further medical issues. Please note that NO REFILLS for any discharge medications will be authorized once you are discharged, as it is imperative that you return to your primary care physician (or establish a relationship with a primary care physician if you do not have one) for your aftercare needs so that they can reassess your need for medications and monitor your lab values.     Today   Renal function improving, nephrostomy tubes are draining well urine is less cloudy.  No hematuria.  Afebrile.  Will discharge to skilled nursing facility today.  VITAL  SIGNS:  Blood pressure (!) 148/94, pulse 77, temperature 98.2 F (36.8 C), temperature source Oral, resp. rate 17, height 5\' 9"  (1.753 m), weight 70 kg, SpO2 99 %.  I/O:    Intake/Output Summary (Last 24 hours) at 11/11/2017 1219 Last data filed at 11/11/2017 1100 Gross per 24 hour  Intake  0 ml  Output 1550 ml  Net -1550 ml    PHYSICAL EXAMINATION:   GENERAL:  67 y.o.-year-old patient lying in the bed with no acute distress.  EYES: Pupils equal, round, reactive to light and accommodation. No scleral icterus. Extraocular muscles intact.  HEENT: Head atraumatic, normocephalic. Oropharynx and nasopharynx clear.  NECK:  Supple, no jugular venous distention. No thyroid enlargement, no tenderness.  LUNGS: Normal breath sounds bilaterally, no wheezing, rales,rhonchi or crepitation. No use of accessory muscles of respiration.  CARDIOVASCULAR: S1, S2 normal. No murmurs, rubs, or gallops.  ABDOMEN: Soft, nontender, nondistended. Bowel sounds present. No organomegaly or mass.  - bilateral nephrostomy tubes in place with yellow urine draining.  EXTREMITIES: No pedal edema, cyanosis, or clubbing.  NEUROLOGIC: Cranial nerves II through XII are intact. No focal motor or sensory deficits appreciated bilaterally, globally weak.   PSYCHIATRIC: The patient is alert and oriented to self  SKIN: No obvious rash, lesion, or ulcer.   DATA REVIEW:   CBC Recent Labs  Lab 11/07/17 0326  WBC 9.5  HGB 8.5*  HCT 24.2*  PLT 628*    Chemistries  Recent Labs  Lab 11/04/17 2007  11/09/17 1004  NA 127*   < > 139  K 4.6   < > 3.6  CL 98   < > 107  CO2 17*   < > 22  GLUCOSE 228*   < > 179*  BUN 70*   < > 21  CREATININE 7.89*   < > 2.24*  CALCIUM 8.2*   < > 8.2*  AST 14*  --   --   ALT 12  --   --   ALKPHOS 99  --   --   BILITOT 0.5  --   --    < > = values in this interval not displayed.    Cardiac Enzymes No results for input(s): TROPONINI in the last 168 hours.  Microbiology Results   Results for orders placed or performed during the hospital encounter of 11/04/17  Urine Culture     Status: Abnormal   Collection Time: 11/04/17  7:36 PM  Result Value Ref Range Status   Specimen Description   Final    URINE, RANDOM Performed at [facility], [address], [place], [place] [address]    Special Requests   Final    NONE Performed at [facility], [address] [place], [place] [address]    Culture (A)  Final    >=100,000 COLONIES/mL ESCHERICHIA COLI >=100,000 COLONIES/mL VANCOMYCIN RESISTANT ENTEROCOCCUS ISOLATED    Report Status 11/07/2017 FINAL  Final   Organism ID, Bacteria ESCHERICHIA COLI (A)  Final   Organism ID, Bacteria VANCOMYCIN RESISTANT ENTEROCOCCUS ISOLATED (A)  Final      Susceptibility   Escherichia coli - MIC*    AMPICILLIN >=32 RESISTANT Resistant     CEFAZOLIN <=4 SENSITIVE Sensitive     CEFTRIAXONE <=1 SENSITIVE Sensitive     CIPROFLOXACIN <=0.25 SENSITIVE Sensitive     GENTAMICIN <=1 SENSITIVE Sensitive     IMIPENEM <=0.25 SENSITIVE Sensitive     NITROFURANTOIN <=16 SENSITIVE Sensitive     TRIMETH/SULFA >=320 RESISTANT Resistant     AMPICILLIN/SULBACTAM 16 INTERMEDIATE Intermediate     PIP/TAZO <=4 SENSITIVE Sensitive     Extended ESBL NEGATIVE Sensitive     * >=100,000 COLONIES/mL ESCHERICHIA COLI   Vancomycin resistant enterococcus isolated - MIC*    AMPICILLIN >=32 RESISTANT Resistant     LEVOFLOXACIN >=8 RESISTANT Resistant  NITROFURANTOIN 256 RESISTANT Resistant     VANCOMYCIN >=32 RESISTANT Resistant     LINEZOLID 2 SENSITIVE Sensitive     * >=100,000 COLONIES/mL VANCOMYCIN RESISTANT ENTEROCOCCUS ISOLATED  MRSA PCR Screening     Status: None   Collection Time: 11/06/17 10:47 PM  Result Value Ref Range Status   MRSA by PCR NEGATIVE NEGATIVE Final    Comment:        The GeneXpert MRSA Assay (FDA approved for NASAL specimens only), is one component of a comprehensive MRSA colonization surveillance  program. It is not intended to diagnose MRSA infection nor to guide or monitor treatment for MRSA infections. Performed at [facility], [address] [place], [place] [address]   Surgical PCR screen     Status: None   Collection Time: 11/06/17 10:47 PM  Result Value Ref Range Status   MRSA, PCR NEGATIVE NEGATIVE Final   Staphylococcus aureus NEGATIVE NEGATIVE Final    Comment: (NOTE) The Xpert SA Assay (FDA approved for NASAL specimens in patients 98 years of age and older), is one component of a comprehensive surveillance program. It is not intended to diagnose infection nor to guide or monitor treatment. Performed at [facility], [address], [place], [place] [address]     RADIOLOGY:  No results found.    Management plans discussed with the patient, family and they are in agreement.  CODE STATUS:     Code Status Orders  (From admission, onward)         Start     Ordered   11/05/17 0138  Full code  Continuous     11/05/17 0137       TOTAL TIME TAKING CARE OF THIS PATIENT: 40 minutes.    Henreitta Leber M.D on 11/11/2017 at 12:19 PM  Between 7am to 6pm - Pager - [phone]  After 6pm go to [email or web address] - Proofreader  [facility]  Office  [phone]  CC: Primary care physician; Albina Billet, MD

## 2017-11-11 NOTE — Care Plan (Signed)
Gave report to [employer]. Ems transported pt. Collier Bullock RN

## 2017-11-11 NOTE — Progress Notes (Signed)
Inpatient Diabetes Program Recommendations  AACE/ADA: New Consensus Statement on Inpatient Glycemic Control (2015)  Target Ranges:  Prepandial:   less than 140 mg/dL      Peak postprandial:   less than 180 mg/dL (1-2 hours)      Critically ill patients:  140 - 180 mg/dL   Lab Results  Component Value Date   GLUCAP 276 (H) 11/11/2017   HGBA1C 9.0 (H) 11/05/2017    Review of Glycemic ControlResults for [name], [name] (MRN [id number]) as of 11/11/2017 09:11  Ref. Range 11/10/2017 11:48 11/10/2017 13:22 11/10/2017 16:47 11/10/2017 21:12 11/11/2017 07:22  Glucose-Capillary Latest Ref Range: 70 - 99 mg/dL 216 (H) 217 (H) 229 (H) 295 (H) 276 (H)   Diabetes history: DM1 Outpatient Diabetes medications: Lantus 37 units daily, Novolog 13 units TID with meals, Novolog 0-15 units QID (before meals and at HS) Current orders for Inpatient glycemic control: Lantus 25 units daily, Novolog 6 units tid with meals, Novolog 0-9 units TID with meals, Novolog 0-5 units QHS  Inpatient Diabetes Program Recommendations:  May consider increasing Lantus to 30 units daily.   Thanks,  Adah Perl, RN, BC-ADM Inpatient Diabetes Coordinator Pager [phone] (8a-5p)

## 2017-11-12 LAB — URINE CULTURE
Culture: <10000 — AB
Culture: NO GROWTH

## 2017-11-15 ENCOUNTER — Other Ambulatory Visit: Payer: Self-pay

## 2017-11-15 ENCOUNTER — Encounter: Payer: Self-pay | Admitting: Oncology

## 2017-11-15 ENCOUNTER — Inpatient Hospital Stay: Payer: Medicare Other

## 2017-11-15 ENCOUNTER — Inpatient Hospital Stay (HOSPITAL_BASED_OUTPATIENT_CLINIC_OR_DEPARTMENT_OTHER): Payer: Medicare Other | Admitting: Oncology

## 2017-11-15 ENCOUNTER — Ambulatory Visit
Admission: RE | Admit: 2017-11-15 | Discharge: 2017-11-15 | Disposition: A | Discharge status: Home / Self Care | Payer: Medicare Other | Source: Ambulatory Visit | Attending: Radiation Oncology | Admitting: Radiation Oncology

## 2017-11-15 ENCOUNTER — Ambulatory Visit: Payer: Medicare Other

## 2017-11-15 VITALS — BP 118/61 | HR 60 | Temp 95.9°F | Wt 155.0 lb

## 2017-11-15 DIAGNOSIS — C689 Malignant neoplasm of urinary organ, unspecified: Secondary | ICD-10-CM

## 2017-11-15 DIAGNOSIS — Z79899 Other long term (current) drug therapy: Secondary | ICD-10-CM

## 2017-11-15 DIAGNOSIS — C679 Malignant neoplasm of bladder, unspecified: Secondary | ICD-10-CM

## 2017-11-15 DIAGNOSIS — Z7901 Long term (current) use of anticoagulants: Secondary | ICD-10-CM | POA: Diagnosis not present

## 2017-11-15 DIAGNOSIS — Z936 Other artificial openings of urinary tract status: Secondary | ICD-10-CM

## 2017-11-15 DIAGNOSIS — D649 Anemia, unspecified: Secondary | ICD-10-CM

## 2017-11-15 DIAGNOSIS — Z87891 Personal history of nicotine dependence: Secondary | ICD-10-CM | POA: Diagnosis not present

## 2017-11-15 DIAGNOSIS — J90 Pleural effusion, not elsewhere classified: Secondary | ICD-10-CM | POA: Diagnosis not present

## 2017-11-15 DIAGNOSIS — D473 Essential (hemorrhagic) thrombocythemia: Secondary | ICD-10-CM

## 2017-11-15 DIAGNOSIS — Z992 Dependence on renal dialysis: Secondary | ICD-10-CM

## 2017-11-15 DIAGNOSIS — I480 Paroxysmal atrial fibrillation: Secondary | ICD-10-CM

## 2017-11-15 DIAGNOSIS — D75839 Thrombocytosis, unspecified: Secondary | ICD-10-CM

## 2017-11-15 DIAGNOSIS — Z5111 Encounter for antineoplastic chemotherapy: Secondary | ICD-10-CM | POA: Diagnosis not present

## 2017-11-15 DIAGNOSIS — Z7189 Other specified counseling: Secondary | ICD-10-CM

## 2017-11-15 DIAGNOSIS — E109 Type 1 diabetes mellitus without complications: Secondary | ICD-10-CM | POA: Diagnosis not present

## 2017-11-15 DIAGNOSIS — N131 Hydronephrosis with ureteral stricture, not elsewhere classified: Secondary | ICD-10-CM | POA: Diagnosis not present

## 2017-11-15 DIAGNOSIS — I129 Hypertensive chronic kidney disease with stage 1 through stage 4 chronic kidney disease, or unspecified chronic kidney disease: Secondary | ICD-10-CM | POA: Diagnosis not present

## 2017-11-15 DIAGNOSIS — R197 Diarrhea, unspecified: Secondary | ICD-10-CM | POA: Diagnosis not present

## 2017-11-15 DIAGNOSIS — N179 Acute kidney failure, unspecified: Secondary | ICD-10-CM

## 2017-11-15 DIAGNOSIS — I2510 Atherosclerotic heart disease of native coronary artery without angina pectoris: Secondary | ICD-10-CM | POA: Diagnosis not present

## 2017-11-15 DIAGNOSIS — Z8673 Personal history of transient ischemic attack (TIA), and cerebral infarction without residual deficits: Secondary | ICD-10-CM | POA: Diagnosis not present

## 2017-11-15 LAB — CBC WITH DIFFERENTIAL/PLATELET
ABS IMMATURE GRANULOCYTES: 0.15 10*3/uL — AB (ref 0.00–0.07)
BASOS PCT: 1 %
Basophils Absolute: 0.1 10*3/uL (ref 0.0–0.1)
Eosinophils Absolute: 0.1 10*3/uL (ref 0.0–0.5)
Eosinophils Relative: 1 %
HCT: 28.7 % — ABNORMAL LOW (ref 39.0–52.0)
Hemoglobin: 9.3 g/dL — ABNORMAL LOW (ref 13.0–17.0)
IMMATURE GRANULOCYTES: 1 %
LYMPHS ABS: 1.2 10*3/uL (ref 0.7–4.0)
Lymphocytes Relative: 11 %
MCH: 28.4 pg (ref 26.0–34.0)
MCHC: 32.4 g/dL (ref 30.0–36.0)
MCV: 87.5 fL (ref 80.0–100.0)
Monocytes Absolute: 0.4 10*3/uL (ref 0.1–1.0)
Monocytes Relative: 4 %
NEUTROS ABS: 9.8 10*3/uL — AB (ref 1.7–7.7)
NEUTROS PCT: 82 %
PLATELETS: 361 10*3/uL (ref 150–400)
RBC: 3.28 MIL/uL — ABNORMAL LOW (ref 4.22–5.81)
RDW: 13.3 % (ref 11.5–15.5)
WBC: 11.8 10*3/uL — AB (ref 4.0–10.5)
nRBC: 0 % (ref 0.0–0.2)

## 2017-11-15 LAB — COMPREHENSIVE METABOLIC PANEL
ALT: 23 U/L (ref 0–44)
ANION GAP: 11 (ref 5–15)
AST: 32 U/L (ref 15–41)
Albumin: 3 g/dL — ABNORMAL LOW (ref 3.5–5.0)
Alkaline Phosphatase: 104 U/L (ref 38–126)
BUN: 33 mg/dL — ABNORMAL HIGH (ref 8–23)
CHLORIDE: 104 mmol/L (ref 98–111)
CO2: 21 mmol/L — AB (ref 22–32)
CREATININE: 2.37 mg/dL — AB (ref 0.61–1.24)
Calcium: 9.5 mg/dL (ref 8.9–10.3)
GFR, EST AFRICAN AMERICAN: 31 mL/min — AB (ref >60)
GFR, EST NON AFRICAN AMERICAN: 27 mL/min — AB (ref >60)
Glucose, Bld: 285 mg/dL — ABNORMAL HIGH (ref 70–99)
POTASSIUM: 4.5 mmol/L (ref 3.5–5.1)
SODIUM: 136 mmol/L (ref 135–145)
Total Bilirubin: 0.4 mg/dL (ref 0.3–1.2)
Total Protein: 8.1 g/dL (ref 6.5–8.1)

## 2017-11-15 LAB — IRON AND TIBC
IRON: 95 ug/dL (ref 45–182)
SATURATION RATIOS: 36 % (ref 17.9–39.5)
TIBC: 262 ug/dL (ref 250–450)
UIBC: 167 ug/dL

## 2017-11-15 LAB — FERRITIN: Ferritin: 226 ng/mL (ref 24–336)

## 2017-11-15 MED ORDER — ONDANSETRON HCL 8 MG PO TABS: 8.0000 mg | ORAL_TABLET | Freq: Two times a day (BID) | ORAL | 1 refills | Status: AC | PRN

## 2017-11-15 MED ORDER — LIDOCAINE-PRILOCAINE 2.5-2.5 % EX CREA: TOPICAL_CREAM | CUTANEOUS | 3 refills | Status: AC

## 2017-11-15 MED ORDER — PROCHLORPERAZINE MALEATE 10 MG PO TABS: 10.0000 mg | ORAL_TABLET | Freq: Four times a day (QID) | ORAL | 1 refills | Status: AC | PRN

## 2017-11-15 NOTE — Progress Notes (Signed)
ON PATHWAY REGIMEN - Bladder  No Change  Continue With Treatment as Ordered.   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

## 2017-11-15 NOTE — Progress Notes (Signed)
DISCONTINUE ON PATHWAY REGIMEN - Bladder   Gemcitabine 27 mg/m2 IV Twice Weekly + RT x 4 Weeks (Induction):   One induction cycle is 4 weeks:     Gemcitabine   **Always confirm dose/schedule in your pharmacy ordering system**  Gemcitabine 27 mg/m2 IV Twice Weekly + RT x 2.5 Weeks (Consolidation):   One consolidation cycle is 2.5 weeks:     Gemcitabine   **Always confirm dose/schedule in your pharmacy ordering system**  REASON: Change In Patient Status PRIOR TREATMENT: [id number]: Gemcitabine 27 mg/m2 Twice Weekly Concurrent with Radiation  START ON PATHWAY REGIMEN - Bladder     A cycle is every 21 days:     Carboplatin      Gemcitabine   **Always confirm dose/schedule in your pharmacy ordering system**  Patient Characteristics: Pre Cystectomy, Clinical T2-T4a, N2-3, M0 AJCC M Category: M0 AJCC N Category: NX AJCC T Category: T4a Current evidence of distant metastases<= No AJCC 8 Stage Grouping: Unknown Intent of Therapy: Non-Curative / Palliative Intent, Discussed with Patient

## 2017-11-15 NOTE — Consult Note (Signed)
NEW PATIENT EVALUATION  Name: Jordan [name]  MRN: [id number]  Date:   11/15/2017     DOB: 1950-10-16   This 67 y.o. male patient presents to the clinic for initial evaluation of locally advanced bladder cancer.Marland Kitchen  REFERRING PHYSICIAN: Albina Billet, MD  CHIEF COMPLAINT: No chief complaint on file.   DIAGNOSIS: The encounter diagnosis was Urothelial cancer ([place]).   PREVIOUS INVESTIGATIONS:  CT scans reviewed Pathology reports reviewed Clinical notes reviewed  HPI: patient is a 67 year old male admitted to [facility] on August 17 with bilateral hydronephrosis and acute renal failure requiring dialysis due to bilateral ureteral obstruction. Nephrostomy tubes were placed.Cystoscopy Dr. Erlene Quan showed probable bladder cancer he underwent a TURBT and bilateral ureteral stents 918. Pathology was positive for high-grade urothelial carcinoma. Tumor invaded the muscularis propria.CT scan demonstrated thickening in the rectum consistent with infectious or inflammatory change or tumor involvement. Soft tissue along the lateral aspect of the left rectum represents most likely tumor.at the time of cystoscopy the was diffuse nodular areas of papillary tumor involving the majority surface of the bladder at least 70%.patient has multiple comorbidities including poorly controlled type 1 diabetes atrial fib and history of colon cancer status post hemicolectomyhe is now referred to reach oncology for consideration of treatment.  PLANNED TREATMENT REGIMEN: concurrent chemoradiation  PAST MEDICAL HISTORY:  has a past medical history of Atrial fibrillation ([place]), Cancer [facility]), Colon polyps, Dysrhythmia, Family history of cancer, Hypertension, Hyperthyroidism, Nursing home resident, Stroke [facility]) (03/2012), and Type 1 diabetes ([place]).    PAST SURGICAL HISTORY:  Past Surgical History:  Procedure Laterality Date  . CARDIOVERSION N/A 03/25/2014   Procedure: CARDIOVERSION;  Surgeon: Lelon Perla, MD;  Location: [facility]  ENDOSCOPY;  Service: Cardiovascular;  Laterality: N/A;  . COLONOSCOPY    . COLONOSCOPY WITH PROPOFOL N/A 07/24/2017   Procedure: COLONOSCOPY WITH PROPOFOL;  Surgeon: Toledo, Benay Pike, MD;  Location: [facility] ENDOSCOPY;  Service: Gastroenterology;  Laterality: N/A;  . CYSTOSCOPY W/ RETROGRADES Bilateral 10/23/2017   Procedure: CYSTOSCOPY WITH RETROGRADE PYELOGRAM;  Surgeon: Hollice Espy, MD;  Location: [facility] ORS;  Service: Urology;  Laterality: Bilateral;  . CYSTOSCOPY WITH RETROGRADE URETHROGRAM Bilateral 10/23/2017   Procedure: CYSTOSCOPY WITH antegrade nephrostogram;  Surgeon: Hollice Espy, MD;  Location: [facility] ORS;  Service: Urology;  Laterality: Bilateral;  . CYSTOSCOPY WITH STENT PLACEMENT Bilateral 10/23/2017   Procedure: CYSTOSCOPY WITH STENT PLACEMENT;  Surgeon: Hollice Espy, MD;  Location: [facility] ORS;  Service: Urology;  Laterality: Bilateral;  . HEMICOLECTOMY Right   . IR NEPHROSTOMY EXCHANGE LEFT  11/08/2017  . IR NEPHROSTOMY EXCHANGE RIGHT  11/08/2017  . IR NEPHROSTOMY PLACEMENT LEFT  09/22/2017  . IR NEPHROSTOMY PLACEMENT RIGHT  09/22/2017  . PORTA CATH INSERTION N/A 11/07/2017   Procedure: PORTA CATH INSERTION;  Surgeon: Algernon Huxley, MD;  Location: [facility] CV LAB;  Service: Cardiovascular;  Laterality: N/A;  . TRANSURETHRAL RESECTION OF BLADDER TUMOR N/A 10/23/2017   Procedure: TRANSURETHRAL RESECTION OF BLADDER TUMOR (TURBT);  Surgeon: Hollice Espy, MD;  Location: [facility] ORS;  Service: Urology;  Laterality: N/A;    FAMILY HISTORY: family history includes Cancer in his maternal aunt; Colon cancer (age of onset: 32) in his brother; Diabetes in his mother; Heart attack in his father and mother; Hypertension in his mother; Lung cancer in his brother.  SOCIAL HISTORY:  reports that he quit smoking about 44 years ago. His smoking use included cigarettes. He quit after 10.00 years of use. He has never used smokeless tobacco. He reports that  he does not drink alcohol or use  drugs.  ALLERGIES: Amiodarone  MEDICATIONS:  Current Outpatient Medications  Medication Sig Dispense Refill  . acetaminophen (TYLENOL) 325 MG tablet Take 2 tablets (650 mg total) by mouth every 6 (six) hours as needed for mild pain (or Fever >/= 101).    Marland Kitchen amiodarone (PACERONE) 200 MG tablet Take 200 mg by mouth daily.  0  . amLODipine (NORVASC) 10 MG tablet Take 10 mg by mouth daily.    Marland Kitchen atorvastatin (LIPITOR) 40 MG tablet Take 40 mg by mouth daily.    . digoxin 62.5 MCG TABS Take 0.0625 mg by mouth daily.    Marland Kitchen docusate sodium (COLACE) 100 MG capsule Take 1 capsule (100 mg total) by mouth 2 (two) times daily. 60 capsule 0  . famotidine (PEPCID) 20 MG tablet Take 1 tablet (20 mg total) by mouth daily.    . folic acid (FOLVITE) 1 MG tablet Take 1 mg by mouth daily.    Marland Kitchen HYDROcodone-acetaminophen (NORCO/VICODIN) 5-325 MG tablet Take 1-2 tablets by mouth every 6 (six) hours as needed for moderate pain. 20 tablet 0  . insulin aspart (NOVOLOG) 100 UNIT/ML injection Inject 10 Units into the skin 3 (three) times daily with meals. (Patient taking differently: Inject 13 Units into the skin 3 (three) times daily with meals. ) 10 mL 11  . insulin aspart (NOVOLOG) 100 UNIT/ML injection Inject 0-15 Units into the skin 4 (four) times daily -  before meals and at bedtime. CBG < 70: implement hypoglycemia protocol CBG 70 - 120: 0 units CBG 121 - 150: 2 units CBG 151 - 200: 3 units CBG 201 - 250: 5 units CBG 251 - 300: 8 units CBG 301 - 350: 11 units CBG 351 - 400: 15 units CBG > 400: call MD and obtain STAT lab verification 10 mL 11  . insulin glargine (LANTUS) 100 UNIT/ML injection Inject 0.37 mLs (37 Units total) into the skin daily. 10 mL 11  . lidocaine-prilocaine (EMLA) cream Apply to port-a-cath prior to accessing 30 g 3  . loratadine (CLARITIN) 10 MG tablet Take 1 tablet (10 mg total) by mouth daily.    . metoprolol tartrate (LOPRESSOR) 25 MG tablet Take 1 tablet (25 mg total) by mouth 2 (two)  times daily.    . Multiple Vitamin (MULTIVITAMIN WITH MINERALS) TABS tablet Take 1 tablet by mouth daily.    . ondansetron (ZOFRAN) 8 MG tablet Take 1 tablet (8 mg total) by mouth 2 (two) times daily as needed (Nausea or vomiting). 30 tablet 1  . oxybutynin (DITROPAN) 5 MG tablet Take 1 tablet (5 mg total) by mouth every 8 (eight) hours as needed for bladder spasms. 30 tablet 0  . polyethylene glycol (MIRALAX / GLYCOLAX) packet Take 17 g by mouth daily.    . prochlorperazine (COMPAZINE) 10 MG tablet Take 1 tablet (10 mg total) by mouth every 6 (six) hours as needed (Nausea or vomiting). 30 tablet 1  . protein supplement shake (PREMIER PROTEIN) LIQD Take 325 mLs (11 oz total) by mouth 2 (two) times daily between meals.  0  . Rivaroxaban (XARELTO) 15 MG TABS tablet Take 15 mg by mouth daily.    . rosuvastatin (CRESTOR) 5 MG tablet Take 5 mg by mouth daily.    Marland Kitchen senna-docusate (SENOKOT-S) 8.6-50 MG tablet Take 1 tablet by mouth daily.    . Simethicone (MYLANTA GAS PO) Take by mouth.    . tamsulosin (FLOMAX) 0.4 MG CAPS capsule Take 1 capsule (  0.4 mg total) by mouth daily after supper. 30 capsule   . traZODone (DESYREL) 50 MG tablet Take 50 mg by mouth at bedtime.    . vitamin C (VITAMIN C) 250 MG tablet Take 1 tablet (250 mg total) by mouth 2 (two) times daily.     No current facility-administered medications for this encounter.     ECOG PERFORMANCE STATUS:  1 - Symptomatic but completely ambulatory  REVIEW OF SYSTEMS:  Patient denies any weight loss, fatigue, weakness, fever, chills or night sweats. Patient denies any loss of vision, blurred vision. Patient denies any ringing  of the ears or hearing loss. No irregular heartbeat. Patient denies heart murmur or history of fainting. Patient denies any chest pain or pain radiating to her upper extremities. Patient denies any shortness of breath, difficulty breathing at night, cough or hemoptysis. Patient denies any swelling in the lower legs. Patient  denies any nausea vomiting, vomiting of blood, or coffee ground material in the vomitus. Patient denies any stomach pain. Patient states has had normal bowel movements no significant constipation or diarrhea. Patient denies any dysuria, hematuria or significant nocturia. Patient denies any problems walking, swelling in the joints or loss of balance. Patient denies any skin changes, loss of hair or loss of weight. Patient denies any excessive worrying or anxiety or significant depression. Patient denies any problems with insomnia. Patient denies excessive thirst, polyuria, polydipsia. Patient denies any swollen glands, patient denies easy bruising or easy bleeding. Patient denies any recent infections, allergies or URI. Patient "s visual fields have not changed significantly in recent time.    PHYSICAL EXAM: There were no vitals taken for this visit. Rail-appearing wheelchair-bound male in NAD. Foley catheter is present no hematuria is notedWell-developed well-nourished patient in NAD. HEENT reveals PERLA, EOMI, discs not visualized.  Oral cavity is clear. No oral mucosal lesions are identified. Neck is clear without evidence of cervical or supraclavicular adenopathy. Lungs are clear to A&P. Cardiac examination is essentially unremarkable with regular rate and rhythm without murmur rub or thrill. Abdomen is benign with no organomegaly or masses noted. Motor sensory and DTR levels are equal and symmetric in the upper and lower extremities. Cranial nerves II through XII are grossly intact. Proprioception is intact. No peripheral adenopathy or edema is identified. No motor or sensory levels are noted. Crude visual fields are within normal range.  LABORATORY DATA: pathology reports reviewed    RADIOLOGY RESULTS:CT scans reviewed   IMPRESSION: locally advanced at least stage III high-grade urothelial carcinoma of the bladder in 67 year old male  PLAN: at this time like to offer palliative radiation therapy  along with concurrent chemotherapy. We treated large field including his bladder and pelvic lymph nodes up to 4500 cGy over 5 weeks. Risks and benefits of treatment including possible increased lower urinary urinary tract symptoms should his Foley be removed possible diarrhea fatigue alteration of blood counts skin reaction all were discussed in detail with the patient and his sister. They both seem to comprehend our treatment plan well. I personally set up and ordered CT simulation for early next week.There will be extra effort by both professional staff as well as technical staff to coordinate and manage concurrent chemoradiation and ensuing side effects during his treatments.  Case was discussed with medical oncology. We will coordinate systemic chemotherapy with their department.  I would like to take this opportunity to thank you for allowing me to participate in the care of your patient.Noreene Filbert, MD

## 2017-11-15 NOTE — Progress Notes (Signed)
Patient here today for follow up.   

## 2017-11-15 NOTE — Progress Notes (Signed)
Well Chemo Visit  Subjective:  Patient ID: DELLAS GUARD, male    DOB: 11/05/1950  Age: 67 y.o. MRN: [id number]  CC: Chemo Care Visit  HPI Delroy Ordway Petrilla presents for to [facility] for initial meeting and discussion in preparation of starting chemotherapy. We discussed that the role of the [facility] is to provide additional resources and assistance to those who may have an increased risk for complications during the course of chemotherapy. High risk factors may include advanced age, performance status and/or comorbid conditions such as hypertension, DM and CKD.  We discussed the relationship with her primary care physician, available insurance, financial concerns/needs, access to medications and potential transportation issues.  Discussed that the goal of this program is to help prevent unplanned ER visits and to help reduce complications during chemotherapy. We introduced symptom management clinic and their availability for same day appointment should problems arise here at the cancer center.   Patient was initially admitted from 8/17 -8/27 for bilateral hydronephrosis and acute renal failure requiring dialysis due to bilateral ureteral obstruction.  Had bilateral nephrostomy tubes placed.  Followed up with urology Dr. Erlene Quan to have cystoscopy.  It was concerning for bladder cancer.  Had TURBT and bilateral ureteral stent placement on 10/23/2017.  Surgical pathology revealed high-grade carcinoma.  CT imaging from 09/22/2017 revealed bilateral symmetric mild hydroureteronephrosis.   Case was discussed at tumor board on 10/31/2017 where there was not evidence of obvious metastatic disease this is likely locally advanced disease extending into the pelvic wall bilaterally with compression on ureters concerning for extrinsic compression of the ureter.   Met with Dr. Tasia Catchings to discuss treatment options on 10/31/2017.  She recommended gemcitabine with concurrent radiation.  Patient in agreement  with plan.  Scheduled to begin chemo ASAP.   History Patient Active Problem List   Diagnosis Date Noted  . Family history of cancer   . Acute UTI 11/04/2017  . Goals of care, counseling/discussion 11/03/2017  . Anemia 11/03/2017  . Chronic anticoagulation 11/03/2017  . Thrombocytosis ([place]) 11/03/2017  . 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 Urinary tract infection 10/25/2017  . Acute kidney injury ([place]) 09/21/2017  . Hyperthyroidism 03/30/2014  . Type I diabetes mellitus ([place]) 03/30/2014  . Hyperglycemia 03/30/2014  . Thyrotoxicosis 03/30/2014  . History of stroke 03/30/2014  . Malnutrition of moderate degree ([place]) 03/28/2014  . Atrial fibrillation with rapid ventricular response ([place]) 03/19/2014   Past Medical History:  Diagnosis Date  . Atrial fibrillation ([place])   . Cancer ([place])    a. 06/2011 b. s/p right hemicolectomy/ colon CA  . Colon polyps   . Dysrhythmia   . Family history of cancer   . Hypertension   . Hyperthyroidism    a. 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 Nursing home resident   . Stroke ([place]) 03/2012   a. residual right sided weakness  . Type 1 diabetes ([place])    a. on insulin b. prior DKA    Past Surgical History:  Procedure Laterality Date  . CARDIOVERSION N/A 03/25/2014   Procedure: CARDIOVERSION;  Surgeon: Lelon Perla, MD;  Location: [facility] ENDOSCOPY;  Service: Cardiovascular;  Laterality: N/A;  . COLONOSCOPY    . COLONOSCOPY WITH PROPOFOL N/A 07/24/2017   Procedure: COLONOSCOPY WITH PROPOFOL;  Surgeon: Toledo, Benay Pike, MD;  Location: [facility] ENDOSCOPY;  Service: Gastroenterology;  Laterality: N/A;  . CYSTOSCOPY W/ RETROGRADES Bilateral 10/23/2017   Procedure: CYSTOSCOPY WITH RETROGRADE  PYELOGRAM;  Surgeon: Hollice Espy, MD;  Location: [facility] ORS;  Service: Urology;  Laterality: Bilateral;  . CYSTOSCOPY WITH RETROGRADE URETHROGRAM Bilateral 10/23/2017   Procedure: CYSTOSCOPY WITH antegrade nephrostogram;  Surgeon: Hollice Espy,  MD;  Location: [facility] ORS;  Service: Urology;  Laterality: Bilateral;  . CYSTOSCOPY WITH STENT PLACEMENT Bilateral 10/23/2017   Procedure: CYSTOSCOPY WITH STENT PLACEMENT;  Surgeon: Hollice Espy, MD;  Location: [facility] ORS;  Service: Urology;  Laterality: Bilateral;  . HEMICOLECTOMY Right   . IR NEPHROSTOMY EXCHANGE LEFT  11/08/2017  . IR NEPHROSTOMY EXCHANGE RIGHT  11/08/2017  . IR NEPHROSTOMY PLACEMENT LEFT  09/22/2017  . IR NEPHROSTOMY PLACEMENT RIGHT  09/22/2017  . PORTA CATH INSERTION N/A 11/07/2017   Procedure: PORTA CATH INSERTION;  Surgeon: Algernon Huxley, MD;  Location: [facility] LAB;  Service: Cardiovascular;  Laterality: N/A;  . TRANSURETHRAL RESECTION OF BLADDER TUMOR N/A 10/23/2017   Procedure: TRANSURETHRAL RESECTION OF BLADDER TUMOR (TURBT);  Surgeon: Hollice Espy, MD;  Location: [facility] ORS;  Service: Urology;  Laterality: N/A;   Allergies  Allergen Reactions  . Amiodarone Other (See Comments)    Thyrotoxicosis per Murray Hodgkins, NP   Prior to Admission medications   Medication Sig Start Date End Date Taking? Authorizing Provider  acetaminophen (TYLENOL) 325 MG tablet Take 2 tablets (650 mg total) by mouth every 6 (six) hours as needed for mild pain (or Fever >/= 101). 10/01/17   Nicholes Mango, MD  amiodarone (PACERONE) 200 MG tablet Take 200 mg by mouth daily. 10/04/17   [provider]  amLODipine (NORVASC) 10 MG tablet Take 10 mg by mouth daily.    [provider]  atorvastatin (LIPITOR) 40 MG tablet Take 40 mg by mouth daily.    [provider]  digoxin 62.5 MCG TABS Take 0.0625 mg by mouth daily. 10/01/17   Nicholes Mango, MD  docusate sodium (COLACE) 100 MG capsule Take 1 capsule (100 mg total) by mouth 2 (two) times daily. 10/23/17   Hollice Espy, MD  famotidine (PEPCID) 20 MG tablet Take 1 tablet (20 mg total) by mouth daily. 10/02/17   Nicholes Mango, MD  folic acid (FOLVITE) 1 MG tablet Take 1 mg by mouth daily.    [provider]    HYDROcodone-acetaminophen (NORCO/VICODIN) 5-325 MG tablet Take 1-2 tablets by mouth every 6 (six) hours as needed for moderate pain. 11/08/17   Gladstone Lighter, MD  insulin aspart (NOVOLOG) 100 UNIT/ML injection Inject 10 Units into the skin 3 (three) times daily with meals. Patient taking differently: Inject 13 Units into the skin 3 (three) times daily with meals.  10/01/17   Gouru, Illene Silver, MD  insulin aspart (NOVOLOG) 100 UNIT/ML injection Inject 0-15 Units into the skin 4 (four) times daily -  before meals and at bedtime. CBG < 70: implement hypoglycemia protocol CBG 70 - 120: 0 units CBG 121 - 150: 2 units CBG 151 - 200: 3 units CBG 201 - 250: 5 units CBG 251 - 300: 8 units CBG 301 - 350: 11 units CBG 351 - 400: 15 units CBG > 400: call MD and obtain STAT lab verification 10/01/17   Gouru, Illene Silver, MD  insulin glargine (LANTUS) 100 UNIT/ML injection Inject 0.37 mLs (37 Units total) into the skin daily. 10/02/17   Nicholes Mango, MD  lidocaine-prilocaine (EMLA) cream Apply to port-a-cath prior to accessing 11/08/17   Gladstone Lighter, MD  loratadine (CLARITIN) 10 MG tablet Take 1 tablet (10 mg total) by mouth daily. 10/02/17   Gouru, Illene Silver,  MD  metoprolol tartrate (LOPRESSOR) 25 MG tablet Take 1 tablet (25 mg total) by mouth 2 (two) times daily. 10/01/17   Nicholes Mango, MD  Multiple Vitamin (MULTIVITAMIN WITH MINERALS) TABS tablet Take 1 tablet by mouth daily. 10/02/17   Gouru, Illene Silver, MD  ondansetron (ZOFRAN) 8 MG tablet Take 1 tablet (8 mg total) by mouth 2 (two) times daily as needed (Nausea or vomiting). 11/03/17   Earlie Server, MD  oxybutynin (DITROPAN) 5 MG tablet Take 1 tablet (5 mg total) by mouth every 8 (eight) hours as needed for bladder spasms. 10/23/17   Hollice Espy, MD  polyethylene glycol [facility] Floria [facility]avel[facility]) packet Take 17 g by mouth daily.    [provider]  prochlorperazine (COMPAZINE) 10 MG tablet Take 1 tablet (10 mg total) by mouth every 6 (six) hours as needed (Nausea or  vomiting). 11/03/17   Earlie Server, MD  protein supplement shake (PREMIER PROTEIN) LIQD Take 325 mLs (11 oz total) by mouth 2 (two) times daily between meals. 10/01/17   Nicholes Mango, MD  Rivaroxaban (XARELTO) 15 MG TABS tablet Take 15 mg by mouth daily.    [provider]  rosuvastatin (CRESTOR) 5 MG tablet Take 5 mg by mouth daily.    [provider]  senna-docusate (SENOKOT-S) 8.6-50 MG tablet Take 1 tablet by mouth daily.    [provider]  Simethicone (MYLANTA GAS PO) Take by mouth.    [provider]  tamsulosin (FLOMAX) 0.4 MG CAPS capsule Take 1 capsule (0.4 mg total) by mouth daily after supper. 10/01/17   Nicholes Mango, MD  traZODone (DESYREL) 50 MG tablet Take 50 mg by mouth at bedtime.    [provider]  vitamin C (VITAMIN C) 250 MG tablet Take 1 tablet (250 mg total) by mouth 2 (two) times daily. 10/01/17   Nicholes Mango, MD   Social History   Socioeconomic History  . Marital status: Single    Spouse name: Not on file  . Number of children: 0  . Years of education: Not on file  . Highest education level: Not on file  Occupational History  . Not on file  Social Needs  . Financial resource strain: Not hard at all  . Food insecurity:    Worry: Never true    Inability: Never true  . Transportation needs:    Medical: Yes    Non-medical: Yes  Tobacco Use  . Smoking status: Former Smoker    Years: 10.00    Types: Cigarettes    Last attempt to quit: 09/21/1973    Years since quitting: 44.1  . Smokeless tobacco: Never Used  Substance and Sexual Activity  . Alcohol use: No  . Drug use: No  . Sexual activity: Not Currently  Lifestyle  . Physical activity:    Days per week: Patient refused    Minutes per session: Patient refused  . Stress: Not on file  Relationships  . Social connections:    Talks on phone: Once a week    Gets together: Once a week    Attends religious service: 1 to 4 times per year    Active member of club or  organization: No    Attends meetings of clubs or organizations: Never    Relationship status: Never married  . Intimate partner violence:    Fear of current or ex partner: Patient refused    Emotionally abused: Patient refused    Physically abused: Patient refused    Forced sexual activity: Patient  refused  Other Topics Concern  . Not on file  Social History Narrative  . Not on file    Review of Systems  Constitutional: Positive for activity change and fatigue. Negative for appetite change, chills and fever.  HENT: Negative.  Negative for congestion, mouth sores, nosebleeds, sinus pain and sore throat.   Eyes: Negative.   Respiratory: Negative.  Negative for cough, shortness of breath and wheezing.   Cardiovascular: Negative.  Negative for chest pain and leg swelling.  Gastrointestinal: Negative.  Negative for abdominal distention, abdominal pain, blood in stool, constipation, diarrhea, nausea and vomiting.  Endocrine: Negative.   Genitourinary: Negative.  Negative for dysuria, flank pain, frequency, hematuria and urgency.       Bilateral nephrostomies in place  Musculoskeletal: Negative.  Negative for arthralgias and back pain.  Skin: Negative.  Negative for pallor.  Allergic/Immunologic: Negative.   Neurological: Negative.  Negative for dizziness, weakness and headaches.  Hematological: Negative.  Negative for adenopathy.  Psychiatric/Behavioral: Negative.  Negative for confusion. The patient is not nervous/anxious.     Objective:  There were no vitals taken for this visit.  Physical Exam  Constitutional: He is oriented to person, place, and time. Vital signs are normal. He appears well-developed and well-nourished.  Patient sitting in wheelchair accompanied by his sister  HENT:  Head: Normocephalic and atraumatic.  Eyes: Pupils are equal, round, and reactive to light.  Neck: Normal range of motion.  Cardiovascular: Normal rate, regular rhythm and normal heart sounds.  No  murmur heard. Pulmonary/Chest: Effort normal and breath sounds normal. He has no wheezes.  Abdominal: Soft. Normal appearance and bowel sounds are normal. He exhibits no distension. There is no tenderness.  Musculoskeletal: Normal range of motion. He exhibits no edema.  Neurological: He is alert and oriented to person, place, and time.  Skin: Skin is warm and dry. No rash noted.  Psychiatric: Judgment normal.  Vitals reviewed.   Assessment & Plan:  MARQUAN VOKES is a 67 y.o. male who presents for chemo care for recently diagnosed urothelial cancer.  Scheduled to begin single agent gemcitabine with concurrent radiation next week.  Attended chemo education.    Urothelial cancer: Recently diagnosed.  Scheduled to begin chemotherapy on 11/18/17 with single agent Gemzar.  Will meet with Dr. Donella Stade for radiation appointments later today.  Port-A-Cath placed on 11/07/2017 by Dr. Delana Meyer.  Chemo and radiation was postponed due to recent hospital admission for acute on chronic kidney failure d/t obstruction due to failure of the ureteral stent.  While inpatient, nephrostomy tubes were uncapped and patient was aggressively hydrated.  Creatinine improved.  Found to have a UTI/cystitis growing VRE and E. coli.  He was treated with IV antibiotics and switch to oral Keflex and Zyvox.  Had CT renal stone study while inpatient showing good placement of bilateral percutaneous nephrostomy and stents.  Hydronephrosis appeared stable.  5 mm low attenuated lesion in the right hepatic lobe that appeared to be new.  [facility].   He has several comorbid conditions and takes several medications unrelated to cancer diagnosis.  They are as follows.   1.  History of atrial fibrillation: Stable on amiodarone. Followed by cardiologist Dr. Chancy Milroy.   2.  Hyperthyroidism: Amiodarone induced.  3.  Hx CVA: On Xarelto.  Right side affected.  4.  Hypertension: On metoprolol and Norvasc.  Managed by PCP.  5.  Type 1 DM: Not managed well.  Most recent A1c was 9.  Has  been seen in the emergency room for DKA.   Followed by endocrinology.  On insulin NovoLog and Lantus.  Will verify premedications will not interfere with diabetes  Management.  He will receive 10 mg Decadron.  May need to adjust insulin on days receiving chemotherapy.  6. Hx Colon Cancer: s/p right hemicolectomy in May 2013. NED.   7: Hypercholesterolemia: On Crestor 5 mg daily.  Managed by PCP Dr. Hall Busing.   He is accompanied by his sister.  He is currently living at [facility].  Family owns a trailer park and he owns his own trailer.  He is hoping to eventually return to his home.  Currently, transportation is through his skilled nursing facility [facility].  Sister did mention that they are often late and he may benefit from our [place].  Lucianne Lei agreement and paperwork filled out and signed.  For now he will continue with their transportation services.  If this becomes an issue, encouraged him to seek our services. They do not express any financial concerns at this time. All of his medications are provided through [facility]. Plan to [facility] provided my card.  Encouraged patient and sister to call if any concerns arise.  Reviewed new medications given prior to initiating chemo.  They include EMLA cream, Zofran and Compazine.  Instructed when to use.   Greater than 50% was spent in counseling and coordination of care with this patient including but not limited to discussion of the relevant topics above (See A&P) including, but not limited to diagnosis and management of acute and chronic medical conditions.   Faythe Casa, NP 11/18/2017 10:32 AM

## 2017-11-15 NOTE — Progress Notes (Signed)
Hematology/Oncology Consult note [facility] Telephone:([phone] [phone]   Patient Care Team: Albina Billet, MD as PCP - General (Internal Medicine)  REFERRING PROVIDER: Dr.Brandon CHIEF COMPLAINTS/REASON FOR VISIT:  Evaluation of bladder cancer  HISTORY OF PRESENTING ILLNESS:  Jordan [name] is a  67 y.o.  male with PMH listed below who was referred to me for evaluation of newly discovered bladder cancer.  He was recently admitted from 8/17-8/27 with bilateral hydronephrosis and acute renal failure requiring dialysis due to bilateral ureteral obstruction. Bilateral nephrostomy tubes were placed while he was inpatient. He followed up in the urology office and had a cystoscopy that was concerning for bladder. He underwent TURBT and bilateral ureteral stent placement on 10/23/17. Surgical pathology showed high grade carcinoma Image was independently revived by me.  09/22/2017 CT abdomen pelvis wo  Bilateral symmetric mild hydroureteronephrosis. Foley catheter present within a collapsed bladder as there is increased density material within the bladder possibly hemorrhagic debris. There is also increased density material over the left pelvis with some crossing midline adjacent both distal ureters left worse than right likely hemorrhagic debris. These factors may be responsible for the Hydroureteronephrosis.  Small bilateral pleural effusions with associated bibasilar atelectasis. 1.4 cm right renal cyst. Previous partial right colectomy.Left femoral venous catheter with tip over the external iliac vein near the junction to common iliac vein. 10/30/2017 CT chest wo contrast  1. No evidence of metastatic disease. 2. Bilateral hydronephrosis and double-J ureteral stents, partially imaged. 3. Coronary artery calcification  10/23/2017 Cystoscopy showed  diffuse nodular, edematous, and areas of papillary tumor involving the majority of the surface of the bladder, at  least 70% of the surface area greater than 5 cm.  The trigone itself is also distorted and nodular. On both sides, there was a high-grade distal ureteral obstruction with a very small caliber ureter which started at the level of the iliacs and extended all the way down to level of the bladder.  This is highly concerning for extrinsic compression of the ureter.s/p TUBRT, ureteral stent placed.   # Type 1 Diabetes,poorly controlled, recently admitted due to DKA. Chronic Paroxysmal A fib, on reduced dose of Xarelto 15mg  daily. .  Bilateral hydronephrosis s/p percutaneous drainage tubes placed, TURBT and ureteral stenting.  Case was discussed on tumor board on 10/31/2017. Although patient did not have obvious metastatic disease, he does likely have locally advanced disease likely extending of the pelvic wall bilaterally which compresses his ureters. On both sides, there was a high-grade distal ureteral obstruction with a very small caliber ureter which started at the level of the iliacs and extended all the way down to level of the bladder.  This is highly concerning for extrinsic compression of the ureter.  #Chronic A. 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 Stage II Reports feeling weak, lower abdominal cramps.   INTERVAL HISTORY Jordan [name] is a 67 y.o. male who has above history reviewed by me today presents for follow up visit for management of locally advanced bladder cancer. During interval he was admitted to the hospital due to acute on chronic kidney failure, found to be post obstruction due to the failure of ureteral stent.  Nephrostomy tubes were uncapped and the patient was aggressively hydrated.  Creatinine improved.  Patient was discharged to rehab facility He also appears to have UTI/cystitis and urine culture growing VRE and E. coli.  He was treated with IV antibiotics  and is switched to oral antibiotics Keflex and Zyvox. Due to the  admission, patient did not meet radiation oncology and has not been simulated yet. There was a CT renal stone study done 11/04/2017 during this admission. CT showed bilateral percutaneous nephrostomy and bilateral ureter stent appear in good position.  Degree of hydronephrosis appears similar.  There is actually thickness of the rectum and soft tissue along the left aspect of the rectum likely represent tumor.  Small bilateral effusion. There is also 5 mm low-attenuation lesion in the right hepatic lobe not further characterized.  This appears to be a new finding during the interval.    Patient presents for post hospitalization follow-up.  He reports feeling weak.  Not having much appetite.  Denies any fever or chills.  Nephrostomy bags draining well. Accompanied by wife today. Review of Systems  Constitutional: Positive for malaise/fatigue and weight loss. Negative for chills and fever.  HENT: Negative for sore throat.   Eyes: Negative for redness.  Respiratory: Negative for cough.   Cardiovascular: Negative for chest pain and leg swelling.  Gastrointestinal: Negative for diarrhea, melena, nausea and vomiting.  Musculoskeletal: Negative for myalgias.  Skin: Negative for rash.  Neurological: Negative for dizziness.  Endo/Heme/Allergies: Does not bruise/bleed easily.  Psychiatric/Behavioral: Negative for hallucinations.    MEDICAL HISTORY:  Past Medical History:  Diagnosis Date  . Atrial fibrillation ([place])   . Cancer ([place])    a. 06/2011 b. s/p right hemicolectomy/ colon CA  . Colon polyps   . Dysrhythmia   . Family history of cancer   . Hypertension   . Hyperthyroidism    a. 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 Nursing home resident   . Stroke ([place]) 03/2012   a. residual right sided weakness  . Type 1 diabetes ([place])    a. on insulin b. prior DKA     SURGICAL HISTORY: Past Surgical History:  Procedure Laterality Date  . CARDIOVERSION N/A 03/25/2014    Procedure: CARDIOVERSION;  Surgeon: Lelon Perla, MD;  Location: [facility] ENDOSCOPY;  Service: Cardiovascular;  Laterality: N/A;  . COLONOSCOPY    . COLONOSCOPY WITH PROPOFOL N/A 07/24/2017   Procedure: COLONOSCOPY WITH PROPOFOL;  Surgeon: Toledo, Benay Pike, MD;  Location: [facility] ENDOSCOPY;  Service: Gastroenterology;  Laterality: N/A;  . CYSTOSCOPY W/ RETROGRADES Bilateral 10/23/2017   Procedure: CYSTOSCOPY WITH RETROGRADE PYELOGRAM;  Surgeon: Hollice Espy, MD;  Location: [facility] ORS;  Service: Urology;  Laterality: Bilateral;  . CYSTOSCOPY WITH RETROGRADE URETHROGRAM Bilateral 10/23/2017   Procedure: CYSTOSCOPY WITH antegrade nephrostogram;  Surgeon: Hollice Espy, MD;  Location: [facility] ORS;  Service: Urology;  Laterality: Bilateral;  . CYSTOSCOPY WITH STENT PLACEMENT Bilateral 10/23/2017   Procedure: CYSTOSCOPY WITH STENT PLACEMENT;  Surgeon: Hollice Espy, MD;  Location: [facility] ORS;  Service: Urology;  Laterality: Bilateral;  . HEMICOLECTOMY Right   . IR NEPHROSTOMY EXCHANGE LEFT  11/08/2017  . IR NEPHROSTOMY EXCHANGE RIGHT  11/08/2017  . IR NEPHROSTOMY PLACEMENT LEFT  09/22/2017  . IR NEPHROSTOMY PLACEMENT RIGHT  09/22/2017  . PORTA CATH INSERTION N/A 11/07/2017   Procedure: PORTA CATH INSERTION;  Surgeon: Algernon Huxley, MD;  Location: [place] CV LAB;  Service: Cardiovascular;  Laterality: N/A;  . TRANSURETHRAL RESECTION OF BLADDER TUMOR N/A 10/23/2017   Procedure: TRANSURETHRAL RESECTION OF BLADDER TUMOR (TURBT);  Surgeon: Hollice Espy, MD;  Location: [facility] ORS;  Service: Urology;  Laterality: N/A;    SOCIAL HISTORY: Social History   Socioeconomic History  .  Marital status: Single    Spouse name: Not on file  . Number of children: 0  . Years of education: Not on file  . Highest education level: Not on file  Occupational History  . Not on file  Social Needs  . Financial resource strain: Not hard at all  . Food insecurity:    Worry: Never true    Inability: Never true  .  Transportation needs:    Medical: Yes    Non-medical: Yes  Tobacco Use  . Smoking status: Former Smoker    Years: 10.00    Types: Cigarettes    Last attempt to quit: 09/21/1973    Years since quitting: 44.1  . Smokeless tobacco: Never Used  Substance and Sexual Activity  . Alcohol use: No  . Drug use: No  . Sexual activity: Not Currently  Lifestyle  . Physical activity:    Days per week: Patient refused    Minutes per session: Patient refused  . Stress: Not on file  Relationships  . Social connections:    Talks on phone: Once a week    Gets together: Once a week    Attends religious service: 1 to 4 times per year    Active member of club or organization: No    Attends meetings of clubs or organizations: Never    Relationship status: Never married  . Intimate partner violence:    Fear of current or ex partner: Patient refused    Emotionally abused: Patient refused    Physically abused: Patient refused    Forced sexual activity: Patient refused  Other Topics Concern  . Not on file  Social History Narrative  . Not on file    FAMILY HISTORY: Family History  Problem Relation Age of Onset  . Heart attack Mother        deceased 16  . Diabetes Mother   . Hypertension Mother   . Heart attack Father        deceased 61  . Colon cancer Brother 70       currently 27  . Lung cancer Brother        smoker; deceased 12  . Cancer Maternal Aunt        unk. primary; deceased 16    ALLERGIES:  is allergic to amiodarone.  MEDICATIONS:  Current Outpatient Medications  Medication Sig Dispense Refill  . acetaminophen (TYLENOL) 325 MG tablet Take 2 tablets (650 mg total) by mouth every 6 (six) hours as needed for mild pain (or Fever >/= 101).    Marland Kitchen amiodarone (PACERONE) 200 MG tablet Take 200 mg by mouth daily.  0  . amLODipine (NORVASC) 10 MG tablet Take 10 mg by mouth daily.    Marland Kitchen atorvastatin (LIPITOR) 40 MG tablet Take 40 mg by mouth daily.    . digoxin 62.5 MCG TABS Take  0.0625 mg by mouth daily.    Marland Kitchen docusate sodium (COLACE) 100 MG capsule Take 1 capsule (100 mg total) by mouth 2 (two) times daily. 60 capsule 0  . famotidine (PEPCID) 20 MG tablet Take 1 tablet (20 mg total) by mouth daily.    . folic acid (FOLVITE) 1 MG tablet Take 1 mg by mouth daily.    Marland Kitchen HYDROcodone-acetaminophen (NORCO/VICODIN) 5-325 MG tablet Take 1-2 tablets by mouth every 6 (six) hours as needed for moderate pain. 20 tablet 0  . insulin aspart (NOVOLOG) 100 UNIT/ML injection Inject 10 Units into the skin 3 (three) times daily with meals. (Patient taking  differently: Inject 13 Units into the skin 3 (three) times daily with meals. ) 10 mL 11  . insulin aspart (NOVOLOG) 100 UNIT/ML injection Inject 0-15 Units into the skin 4 (four) times daily -  before meals and at bedtime. CBG < 70: implement hypoglycemia protocol CBG 70 - 120: 0 units CBG 121 - 150: 2 units CBG 151 - 200: 3 units CBG 201 - 250: 5 units CBG 251 - 300: 8 units CBG 301 - 350: 11 units CBG 351 - 400: 15 units CBG > 400: call MD and obtain STAT lab verification 10 mL 11  . insulin glargine (LANTUS) 100 UNIT/ML injection Inject 0.37 mLs (37 Units total) into the skin daily. 10 mL 11  . lidocaine-prilocaine (EMLA) cream Apply to affected area once 30 g 3  . loratadine (CLARITIN) 10 MG tablet Take 1 tablet (10 mg total) by mouth daily.    . metoprolol tartrate (LOPRESSOR) 25 MG tablet Take 1 tablet (25 mg total) by mouth 2 (two) times daily.    . Multiple Vitamin (MULTIVITAMIN WITH MINERALS) TABS tablet Take 1 tablet by mouth daily.    . ondansetron (ZOFRAN) 8 MG tablet Take 1 tablet (8 mg total) by mouth 2 (two) times daily as needed for refractory nausea / vomiting. Start on day 3 after carboplatin chemo. 30 tablet 1  . oxybutynin (DITROPAN) 5 MG tablet Take 1 tablet (5 mg total) by mouth every 8 (eight) hours as needed for bladder spasms. 30 tablet 0  . polyethylene glycol (MIRALAX / GLYCOLAX) packet Take 17 g by mouth  daily.    . prochlorperazine (COMPAZINE) 10 MG tablet Take 1 tablet (10 mg total) by mouth every 6 (six) hours as needed (Nausea or vomiting). 30 tablet 1  . protein supplement shake (PREMIER PROTEIN) LIQD Take 325 mLs (11 oz total) by mouth 2 (two) times daily between meals.  0  . Rivaroxaban (XARELTO) 15 MG TABS tablet Take 15 mg by mouth daily.    . rosuvastatin (CRESTOR) 5 MG tablet Take 5 mg by mouth daily.    Marland Kitchen senna-docusate (SENOKOT-S) 8.6-50 MG tablet Take 1 tablet by mouth daily.    . Simethicone (MYLANTA GAS PO) Take by mouth.    . tamsulosin (FLOMAX) 0.4 MG CAPS capsule Take 1 capsule (0.4 mg total) by mouth daily after supper. 30 capsule   . traZODone (DESYREL) 50 MG tablet Take 50 mg by mouth at bedtime.    . vitamin C (VITAMIN C) 250 MG tablet Take 1 tablet (250 mg total) by mouth 2 (two) times daily.     No current facility-administered medications for this visit.      PHYSICAL EXAMINATION: ECOG PERFORMANCE STATUS: 2 - Symptomatic, <50% confined to bed Vitals:   11/15/17 1242  BP: 118/61  Pulse: 60  Temp: (!) 95.9 F (35.5 C)   Filed Weights   11/15/17 1242  Weight: 155 lb (70.3 kg)    Physical Exam  Constitutional: He is oriented to person, place, and time. No distress.  HENT:  Head: Normocephalic and atraumatic.  Mouth/Throat: No oropharyngeal exudate.  Eyes: Pupils are equal, round, and reactive to light. EOM are normal. No scleral icterus.  Pale conjunctivae  Neck: Normal range of motion. Neck supple.  Cardiovascular: Normal rate and regular rhythm.  No murmur heard. Pulmonary/Chest: Effort normal and breath sounds normal.  Abdominal: Soft. Bowel sounds are normal.  Genitourinary:  Genitourinary Comments: Bilateral percutaneous nephrostomy tubes with urine in the nephrostomy bags.  Musculoskeletal: Normal  range of motion. He exhibits no edema.  Neurological: He is alert and oriented to person, place, and time. No cranial nerve deficit.  Skin: Skin is  warm and dry. No erythema.     LABORATORY DATA:  I have reviewed the data as listed Lab Results  Component Value Date   WBC 11.8 (H) 11/15/2017   HGB 9.3 (L) 11/15/2017   HCT 28.7 (L) 11/15/2017   MCV 87.5 11/15/2017   PLT 361 11/15/2017   Recent Labs    10/31/17 1630 11/04/17 2007  11/09/17 1004 11/11/17 1228 11/15/17 0838  NA 129* 127*   < > 139 136 136  K 4.5 4.6   < > 3.6 3.9 4.5  CL 102 98   < > 107 102 104  CO2 18* 17*   < > 22 26 21*  GLUCOSE 337* 228*   < > 179* 226* 285*  BUN 41* 70*   < > 21 23 33*  CREATININE 3.30* 7.89*   < > 2.24* 2.21* 2.37*  CALCIUM 8.6* 8.2*   < > 8.2* 8.6* 9.5  GFRNONAA 18* 6*   < > 29* 29* 27*  GFRAA 21* 7*   < > 33* 34* 31*  PROT 7.7 6.9  --   --   --  8.1  ALBUMIN 2.7* 2.5*  --   --   --  3.0*  AST 18 14*  --   --   --  32  ALT 15 12  --   --   --  23  ALKPHOS 103 99  --   --   --  104  BILITOT 0.4 0.5  --   --   --  0.4   < > = values in this interval not displayed.   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 Goals of care, counseling/discussion   3. Acute kidney injury ([place])   4. Chronic anticoagulation   5. Anemia, unspecified type   6. Nephrostomy status ([place])    #Repeat CT renal stone study was independently reviewed by me and discussed with patient. New findings of small non-well-characterized liver hypodensity lesion 5 mm and appears that he has local invasion of rectum, clinically T4 disease. Given his multiple comorbidities, performance status, he is not a surgical candidate. He will meet Dr. Baruch Gouty radiation oncology to discuss radiation and schedule for simulation. I discussed with Dr. Donella Stade.  He will most likely be simulated next week and will be ready to start with concurrent chemoradiation the following  week. Given that his disease progressed rapidly, developed clinical rectum involvement during interval,, questionable distant metastasis/liver lesion I recommend to start with a cycle of systemic chemotherapy with carboplatin and gemcitabine next week for disease control. He already has had port placed and had been to chemo class.  I explained to the patient the risks and benefits of chemotherapy including all but not limited to hair loss, mouth sore, nausea, vomiting, low blood counts, bleeding, and risk of life threatening infection and even death, secondary malignancy etc.  Patient voices understanding and willing to proceed chemotherapy.   # Chemotherapy education; port placement. Hopefully the planned start chemotherapy next week. Antiemetics-Zofran and Compazine; EMLA cream sent to pharmacy   #Chronic A. Fib,  on chronic anticoagulation with Xarelto 15 mg daily..  On amiodarone and digoxin. #Anemia, hemoglobin 9.3, likely secondary to blood loss continue to monitor.  We will check iron level  # Personal history of both colon cancer and urothelia cancer, I have referred him to genetic counselor.  # NGS pending. # Goal of care is discussed. Most likely not curable, treatment are with palliative intent.  # Diabetes.  Refer to palliative care and Dietitian   Orders Placed This Encounter  Procedures  . CBC with Differential    Standing Status:   Standing    Number of Occurrences:   20    Standing Expiration Date:   11/16/2018  . Basic metabolic panel    Standing Status:   Standing    Number of Occurrences:   20    Standing Expiration Date:   11/16/2018  . Ambulatory Referral to Palliative Care    Referral Priority:   Routine    Referral Type:   Consultation    Referral Reason:   Goals of Care    Referred to Provider:   Borders, Kirt Boys, NP    Number of Visits Requested:   1  . Amb Referral to Nutrition and Diabetic E    Referral Priority:   Routine    Referral Type:   Consultation     Referral Reason:   Specialty Services Required    Number of Visits Requested:   1    All questions were answered. The patient knows to call the clinic with any problems questions or concerns.  Return of visit: 3 days to start chemotherapy.  Total face to face encounter time for this patient visit was 45 min. >50% of the time was  spent in counseling and coordination of care.   Earlie Server, MD, PhD Hematology Oncology [facility] Pager- [phone] 11/15/2017

## 2017-11-18 ENCOUNTER — Inpatient Hospital Stay: Payer: Medicare Other

## 2017-11-18 ENCOUNTER — Encounter: Payer: Self-pay | Admitting: Oncology

## 2017-11-18 ENCOUNTER — Inpatient Hospital Stay (HOSPITAL_BASED_OUTPATIENT_CLINIC_OR_DEPARTMENT_OTHER): Payer: Medicare Other | Admitting: Hospice and Palliative Medicine

## 2017-11-18 ENCOUNTER — Inpatient Hospital Stay (HOSPITAL_BASED_OUTPATIENT_CLINIC_OR_DEPARTMENT_OTHER): Payer: Medicare Other | Admitting: Oncology

## 2017-11-18 ENCOUNTER — Other Ambulatory Visit: Payer: Self-pay

## 2017-11-18 VITALS — BP 129/73 | HR 69 | Temp 96.9°F | Resp 16 | Wt 154.0 lb

## 2017-11-18 DIAGNOSIS — Z7901 Long term (current) use of anticoagulants: Secondary | ICD-10-CM

## 2017-11-18 DIAGNOSIS — I480 Paroxysmal atrial fibrillation: Secondary | ICD-10-CM

## 2017-11-18 DIAGNOSIS — Z515 Encounter for palliative care: Secondary | ICD-10-CM

## 2017-11-18 DIAGNOSIS — Z936 Other artificial openings of urinary tract status: Secondary | ICD-10-CM

## 2017-11-18 DIAGNOSIS — C689 Malignant neoplasm of urinary organ, unspecified: Secondary | ICD-10-CM

## 2017-11-18 DIAGNOSIS — Z992 Dependence on renal dialysis: Secondary | ICD-10-CM

## 2017-11-18 DIAGNOSIS — Z5111 Encounter for antineoplastic chemotherapy: Secondary | ICD-10-CM | POA: Diagnosis not present

## 2017-11-18 DIAGNOSIS — Z79899 Other long term (current) drug therapy: Secondary | ICD-10-CM

## 2017-11-18 DIAGNOSIS — D649 Anemia, unspecified: Secondary | ICD-10-CM

## 2017-11-18 DIAGNOSIS — C679 Malignant neoplasm of bladder, unspecified: Secondary | ICD-10-CM

## 2017-11-18 LAB — COMPREHENSIVE METABOLIC PANEL
ALBUMIN: 3.1 g/dL — AB (ref 3.5–5.0)
ALT: 24 U/L (ref 0–44)
AST: 28 U/L (ref 15–41)
Alkaline Phosphatase: 115 U/L (ref 38–126)
Anion gap: 11 (ref 5–15)
BUN: 33 mg/dL — AB (ref 8–23)
CHLORIDE: 103 mmol/L (ref 98–111)
CO2: 21 mmol/L — ABNORMAL LOW (ref 22–32)
Calcium: 9.4 mg/dL (ref 8.9–10.3)
Creatinine, Ser: 2.16 mg/dL — ABNORMAL HIGH (ref 0.61–1.24)
GFR calc Af Amer: 35 mL/min — ABNORMAL LOW (ref >60)
GFR calc non Af Amer: 30 mL/min — ABNORMAL LOW (ref >60)
GLUCOSE: 171 mg/dL — AB (ref 70–99)
POTASSIUM: 4.4 mmol/L (ref 3.5–5.1)
Sodium: 135 mmol/L (ref 135–145)
Total Bilirubin: 0.4 mg/dL (ref 0.3–1.2)
Total Protein: 7.9 g/dL (ref 6.5–8.1)

## 2017-11-18 LAB — CBC WITH DIFFERENTIAL/PLATELET
Abs Immature Granulocytes: 0.05 10*3/uL (ref 0.00–0.07)
Basophils Absolute: 0 10*3/uL (ref 0.0–0.1)
Basophils Relative: 0 %
Eosinophils Absolute: 0.2 10*3/uL (ref 0.0–0.5)
Eosinophils Relative: 2 %
HCT: 28.2 % — ABNORMAL LOW (ref 39.0–52.0)
Hemoglobin: 9.2 g/dL — ABNORMAL LOW (ref 13.0–17.0)
Immature Granulocytes: 1 %
LYMPHS PCT: 16 %
Lymphs Abs: 1.8 10*3/uL (ref 0.7–4.0)
MCH: 28 pg (ref 26.0–34.0)
MCHC: 32.6 g/dL (ref 30.0–36.0)
MCV: 86 fL (ref 80.0–100.0)
MONO ABS: 0.7 10*3/uL (ref 0.1–1.0)
MONOS PCT: 7 %
Neutro Abs: 8.1 10*3/uL — ABNORMAL HIGH (ref 1.7–7.7)
Neutrophils Relative %: 74 %
Platelets: 264 10*3/uL (ref 150–400)
RBC: 3.28 MIL/uL — AB (ref 4.22–5.81)
RDW: 13.3 % (ref 11.5–15.5)
WBC: 10.8 10*3/uL — AB (ref 4.0–10.5)
nRBC: 0 % (ref 0.0–0.2)

## 2017-11-18 MED ORDER — DEXAMETHASONE SODIUM PHOSPHATE 10 MG/ML IJ SOLN
10.0000 mg | Freq: Once | INTRAMUSCULAR | Status: AC
  Administered 2017-11-18: 10 mg via INTRAVENOUS
  Filled 2017-11-18: qty 1

## 2017-11-18 MED ORDER — PALONOSETRON HCL INJECTION 0.25 MG/5ML
0.2500 mg | Freq: Once | INTRAVENOUS | Status: AC
  Administered 2017-11-18: 0.25 mg via INTRAVENOUS
  Filled 2017-11-18: qty 5

## 2017-11-18 MED ORDER — SODIUM CHLORIDE 0.9 % IV SOLN: 10.0000 mg | Freq: Once | INTRAVENOUS | Status: DC

## 2017-11-18 MED ORDER — SODIUM CHLORIDE 0.9 % IV SOLN
Freq: Once | INTRAVENOUS | Status: AC
  Administered 2017-11-18: 10:00:00 via INTRAVENOUS
  Filled 2017-11-18: qty 250

## 2017-11-18 MED ORDER — SODIUM CHLORIDE 0.9 % IV SOLN
1800.0000 mg | Freq: Once | INTRAVENOUS | Status: AC
  Administered 2017-11-18: 1800 mg via INTRAVENOUS
  Filled 2017-11-18: qty 26.3

## 2017-11-18 MED ORDER — SODIUM CHLORIDE 0.9 % IV SOLN
260.5500 mg | Freq: Once | INTRAVENOUS | Status: AC
  Administered 2017-11-18: 260 mg via INTRAVENOUS
  Filled 2017-11-18: qty 26

## 2017-11-18 MED ORDER — SODIUM CHLORIDE 0.9% FLUSH
10.0000 mL | INTRAVENOUS | Status: DC | PRN
  Administered 2017-11-18: 10 mL via INTRAVENOUS
  Filled 2017-11-18: qty 10

## 2017-11-18 MED ORDER — HEPARIN SOD (PORK) LOCK FLUSH 100 UNIT/ML IV SOLN
500.0000 [IU] | Freq: Once | INTRAVENOUS | Status: AC
  Administered 2017-11-18: 500 [IU] via INTRAVENOUS
  Filled 2017-11-18: qty 5

## 2017-11-18 NOTE — Progress Notes (Signed)
Creatinine: 2.16. Lab results reviewed with MD, Dr. Tasia Catchings. Per MD order: proceed with scheduled treatment today.

## 2017-11-18 NOTE — Progress Notes (Signed)
Nutrition Follow-up:  Patient with urothelial cancer. Noted recent hospital admission for bilateral hydronephrosis and acute renal failure requiring dialysis due to bilateral obstruction.  S/p bilateral nephrostomy tubes.  Past medical history of DM, afib, stroke. Patient living at [facility]  Met with patient and sister today during infusion.  Sister mostly gave information.  Reports appetite is poor especially since hospital admission in August.  Patient reports gets full quickly.  Reports that he does receive supplement on meal trays at [facility] but does not always drink it.  Reports he has tried it and likes it but not sure the name of it.  Ate few bites of breakfast from [employer] this am.     Medications: novolog, lantus, MVI, Vit C  Labs: glucose 171, BUN 33, creatinine 2.16  Anthropometrics:   Height: 69 inches Weight: 154 lb UBW: 180s.  Noted 177 lb on 6/19 BMI: 22  13% weight loss in the last 4 months   Estimated Energy Needs  Kcals: 2100-2450 calories/d Protein: 105-122 g/d Fluid: 2.4 L/d  NUTRITION DIAGNOSIS: Inadequate oral intake related to poor appetite as evidenced by 13% weight loss in the last 4 months   INTERVENTION: Discussed importance of good nutrition.   Patient may benefit from appetite stimulant.  Will send in basket to Dr. Tasia Catchings Recommend if unable to drink protein shake on meal tray to hold off and drink between meals.  Discussed option of having snacks on hand in room that patient likes and can eat between meals.  Examples provided with sister.     MONITORING, EVALUATION, GOAL: weight trends, intake   NEXT VISIT: Monday, October 28  [place] Retana B. Zenia Resides, [place], [place] Registered Dietitian [phone] (pager)

## 2017-11-18 NOTE — Progress Notes (Signed)
Hematology/Oncology Consult note [facility] [phone] [phone]   Patient Care Team: Albina Billet, MD as PCP - General (Internal Medicine)  REFERRING PROVIDER: Dr.Brandon REASON FOR VISIT Follow up for treatment of  bladder cancer  HISTORY OF PRESENTING ILLNESS:  Jordan [name] is a  67 y.o.  male with PMH listed below who was referred to me for evaluation of newly discovered bladder cancer.  He was recently admitted from 8/17-8/27 with bilateral hydronephrosis and acute renal failure requiring dialysis due to bilateral ureteral obstruction. Bilateral nephrostomy tubes were placed while he was inpatient. He followed up in the urology office and had a cystoscopy that was concerning for bladder. He underwent TURBT and bilateral ureteral stent placement on 10/23/17. Surgical pathology showed high grade carcinoma Image was independently revived by me.  09/22/2017 CT abdomen pelvis wo  Bilateral symmetric mild hydroureteronephrosis. Foley catheter present within a collapsed bladder as there is increased density material within the bladder possibly hemorrhagic debris. There is also increased density material over the left pelvis with some crossing midline adjacent both distal ureters left worse than right likely hemorrhagic debris. These factors may be responsible for the Hydroureteronephrosis.  Small bilateral pleural effusions with associated bibasilar atelectasis. 1.4 cm right renal cyst. Previous partial right colectomy.Left femoral venous catheter with tip over the external iliac vein near the junction to common iliac vein. 10/30/2017 CT chest wo contrast  1. No evidence of metastatic disease. 2. Bilateral hydronephrosis and double-J ureteral stents, partially imaged. 3. Coronary artery calcification  10/23/2017 Cystoscopy showed  diffuse nodular, edematous, and areas of papillary tumor involving the majority of the surface of the bladder, at least  70% of the surface area greater than 5 cm.  The trigone itself is also distorted and nodular. On both sides, there was a high-grade distal ureteral obstruction with a very small caliber ureter which started at the level of the iliacs and extended all the way down to level of the bladder.  This is highly concerning for extrinsic compression of the ureter.s/p TUBRT, ureteral stent placed.   # Type 1 Diabetes,poorly controlled, recently admitted due to DKA. Chronic Paroxysmal A fib, on reduced dose of Xarelto 15mg  daily. .  Bilateral hydronephrosis s/p percutaneous drainage tubes placed, TURBT and ureteral stenting.  Case was discussed on tumor board on 10/31/2017. Although patient did not have obvious metastatic disease, he does likely have locally advanced disease likely extending of the pelvic wall bilaterally which compresses his ureters. On both sides, there was a high-grade distal ureteral obstruction with a very small caliber ureter which started at the level of the iliacs and extended all the way down to level of the bladder.  This is highly concerning for extrinsic compression of the ureter.  #Chronic A. 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 Stage II  # He was admitted to the hospital due to acute on chronic kidney failure, found to be post obstruction due to the failure of ureteral stent.  Nephrostomy tubes were uncapped and the patient was aggressively hydrated.  Creatinine improved.  Patient was discharged to rehab facility He also appears to have UTI/cystitis and urine culture growing VRE and E. coli.  He was treated with IV antibiotics and is switched to oral antibiotics Keflex and Zyvox. Due to the admission, patient did not meet radiation oncology and has not been simulated yet. There was a CT renal stone study done 11/04/2017 during this  admission.  CT showed bilateral percutaneous nephrostomy and bilateral ureter stent appear in good  position.  Degree of hydronephrosis appears similar.  There is actually thickness of the rectum and soft tissue along the left aspect of the rectum likely represent tumor.  Small bilateral effusion. There is also 5 mm low-attenuation lesion in the right hepatic lobe not further characterized.  This appears to be a new finding during the interval.  INTERVAL HISTORY Jordan [name] is a 67 y.o. male who has above history reviewed by me today presents for follow-up visit for assessment prior to chemotherapy for locally advanced bladder cancer. Denies fever or chills. Fatigue: reports worsening fatigue. Chronic onset, perisistent, no aggravating or improving factors, no associated symptoms.  Patient currently in rehab facility.  Feeling weak, not having much appetite.  No nausea vomiting abdominal pain. Nephrostomy bags draining well. Accompanied by sister today.  Review of Systems  Constitutional: Positive for malaise/fatigue and weight loss. Negative for chills and fever.  HENT: Negative for nosebleeds and sore throat.   Eyes: Negative for double vision, photophobia and redness.  Respiratory: Negative for cough, shortness of breath and wheezing.   Cardiovascular: Negative for chest pain, palpitations, orthopnea and leg swelling.  Gastrointestinal: Negative for abdominal pain, blood in stool, diarrhea, melena, nausea and vomiting.  Genitourinary: Negative for dysuria.  Musculoskeletal: Negative for back pain, myalgias and neck pain.  Skin: Negative for itching and rash.  Neurological: Negative for dizziness, tingling and tremors.  Endo/Heme/Allergies: Negative for environmental allergies. Does not bruise/bleed easily.  Psychiatric/Behavioral: Negative for depression and hallucinations.    MEDICAL HISTORY:  Past Medical History:  Diagnosis Date  . Atrial fibrillation ([place])   . Cancer ([place])    a. 06/2011 b. s/p right hemicolectomy/ colon CA  . Colon polyps   . Dysrhythmia   . Family history  of cancer   . Hypertension   . Hyperthyroidism    a. 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 Nursing home resident   . Stroke ([place]) 03/2012   a. residual right sided weakness  . Type 1 diabetes ([place])    a. on insulin b. prior DKA     SURGICAL HISTORY: Past Surgical History:  Procedure Laterality Date  . CARDIOVERSION N/A 03/25/2014   Procedure: CARDIOVERSION;  Surgeon: Lelon Perla, MD;  Location: [facility] ENDOSCOPY;  Service: Cardiovascular;  Laterality: N/A;  . COLONOSCOPY    . COLONOSCOPY WITH PROPOFOL N/A 07/24/2017   Procedure: COLONOSCOPY WITH PROPOFOL;  Surgeon: Toledo, Benay Pike, MD;  Location: [facility] ENDOSCOPY;  Service: Gastroenterology;  Laterality: N/A;  . CYSTOSCOPY W/ RETROGRADES Bilateral 10/23/2017   Procedure: CYSTOSCOPY WITH RETROGRADE PYELOGRAM;  Surgeon: Hollice Espy, MD;  Location: [facility] ORS;  Service: Urology;  Laterality: Bilateral;  . CYSTOSCOPY WITH RETROGRADE URETHROGRAM Bilateral 10/23/2017   Procedure: CYSTOSCOPY WITH antegrade nephrostogram;  Surgeon: Hollice Espy, MD;  Location: [facility] ORS;  Service: Urology;  Laterality: Bilateral;  . CYSTOSCOPY WITH STENT PLACEMENT Bilateral 10/23/2017   Procedure: CYSTOSCOPY WITH STENT PLACEMENT;  Surgeon: Hollice Espy, MD;  Location: [facility] ORS;  Service: Urology;  Laterality: Bilateral;  . HEMICOLECTOMY Right   . IR NEPHROSTOMY EXCHANGE LEFT  11/08/2017  . IR NEPHROSTOMY EXCHANGE RIGHT  11/08/2017  . IR NEPHROSTOMY PLACEMENT LEFT  09/22/2017  . IR NEPHROSTOMY PLACEMENT RIGHT  09/22/2017  . PORTA CATH INSERTION N/A 11/07/2017   Procedure: PORTA CATH INSERTION;  Surgeon: Algernon Huxley, MD;  Location: [place] CV LAB;  Service: Cardiovascular;  Laterality: N/A;  . TRANSURETHRAL RESECTION OF BLADDER TUMOR N/A 10/23/2017   Procedure: TRANSURETHRAL RESECTION OF BLADDER TUMOR (TURBT);  Surgeon: Hollice Espy, MD;  Location: [facility] ORS;  Service: Urology;  Laterality: N/A;    SOCIAL HISTORY: Social History    Socioeconomic History  . Marital status: Single    Spouse name: Not on file  . Number of children: 0  . Years of education: Not on file  . Highest education level: Not on file  Occupational History  . Not on file  Social Needs  . Financial resource strain: Not hard at all  . Food insecurity:    Worry: Never true    Inability: Never true  . Transportation needs:    Medical: Yes    Non-medical: Yes  Tobacco Use  . Smoking status: Former Smoker    Years: 10.00    Types: Cigarettes    Last attempt to quit: 09/21/1973    Years since quitting: 44.1  . Smokeless tobacco: Never Used  Substance and Sexual Activity  . Alcohol use: No  . Drug use: No  . Sexual activity: Not Currently  Lifestyle  . Physical activity:    Days per week: Patient refused    Minutes per session: Patient refused  . Stress: Not on file  Relationships  . Social connections:    Talks on phone: Once a week    Gets together: Once a week    Attends religious service: 1 to 4 times per year    Active member of club or organization: No    Attends meetings of clubs or organizations: Never    Relationship status: Never married  . Intimate partner violence:    Fear of current or ex partner: Patient refused    Emotionally abused: Patient refused    Physically abused: Patient refused    Forced sexual activity: Patient refused  Other Topics Concern  . Not on file  Social History Narrative  . Not on file    FAMILY HISTORY: Family History  Problem Relation Age of Onset  . Heart attack Mother        deceased 34  . Diabetes Mother   . Hypertension Mother   . Heart attack Father        deceased 61  . Colon cancer Brother 26       currently 33  . Lung cancer Brother        smoker; deceased 68  . Cancer Maternal Aunt        unk. primary; deceased 1    ALLERGIES:  is allergic to amiodarone.  MEDICATIONS:  Current Outpatient Medications  Medication Sig Dispense Refill  . acetaminophen (TYLENOL) 325  MG tablet Take 2 tablets (650 mg total) by mouth every 6 (six) hours as needed for mild pain (or Fever >/= 101).    Marland Kitchen amiodarone (PACERONE) 200 MG tablet Take 200 mg by mouth daily.  0  . amLODipine (NORVASC) 10 MG tablet Take 10 mg by mouth daily.    Marland Kitchen atorvastatin (LIPITOR) 40 MG tablet Take 40 mg by mouth daily.    . digoxin 62.5 MCG TABS Take 0.0625 mg by mouth daily.    Marland Kitchen docusate sodium (COLACE) 100 MG capsule Take 1 capsule (100 mg total) by mouth 2 (two) times daily. 60 capsule 0  . famotidine (PEPCID) 20 MG tablet Take 1 tablet (20 mg total) by mouth daily.    . folic acid (FOLVITE) 1 MG tablet Take 1 mg  by mouth daily.    Marland Kitchen HYDROcodone-acetaminophen (NORCO/VICODIN) 5-325 MG tablet Take 1-2 tablets by mouth every 6 (six) hours as needed for moderate pain. 20 tablet 0  . insulin aspart (NOVOLOG) 100 UNIT/ML injection Inject 10 Units into the skin 3 (three) times daily with meals. (Patient taking differently: Inject 13 Units into the skin 3 (three) times daily with meals. ) 10 mL 11  . insulin aspart (NOVOLOG) 100 UNIT/ML injection Inject 0-15 Units into the skin 4 (four) times daily -  before meals and at bedtime. CBG < 70: implement hypoglycemia protocol CBG 70 - 120: 0 units CBG 121 - 150: 2 units CBG 151 - 200: 3 units CBG 201 - 250: 5 units CBG 251 - 300: 8 units CBG 301 - 350: 11 units CBG 351 - 400: 15 units CBG > 400: call MD and obtain STAT lab verification 10 mL 11  . insulin glargine (LANTUS) 100 UNIT/ML injection Inject 0.37 mLs (37 Units total) into the skin daily. 10 mL 11  . lidocaine-prilocaine (EMLA) cream Apply to affected area once 30 g 3  . loratadine (CLARITIN) 10 MG tablet Take 1 tablet (10 mg total) by mouth daily.    . metoprolol tartrate (LOPRESSOR) 25 MG tablet Take 1 tablet (25 mg total) by mouth 2 (two) times daily.    . Multiple Vitamin (MULTIVITAMIN WITH MINERALS) TABS tablet Take 1 tablet by mouth daily.    . ondansetron (ZOFRAN) 8 MG tablet Take 1 tablet  (8 mg total) by mouth 2 (two) times daily as needed for refractory nausea / vomiting. Start on day 3 after carboplatin chemo. 30 tablet 1  . oxybutynin (DITROPAN) 5 MG tablet Take 1 tablet (5 mg total) by mouth every 8 (eight) hours as needed for bladder spasms. 30 tablet 0  . polyethylene glycol (MIRALAX / GLYCOLAX) packet Take 17 g by mouth daily.    . prochlorperazine (COMPAZINE) 10 MG tablet Take 1 tablet (10 mg total) by mouth every 6 (six) hours as needed (Nausea or vomiting). 30 tablet 1  . protein supplement shake (PREMIER PROTEIN) LIQD Take 325 mLs (11 oz total) by mouth 2 (two) times daily between meals.  0  . Rivaroxaban (XARELTO) 15 MG TABS tablet Take 15 mg by mouth daily.    . rosuvastatin (CRESTOR) 5 MG tablet Take 5 mg by mouth daily.    Marland Kitchen senna-docusate (SENOKOT-S) 8.6-50 MG tablet Take 1 tablet by mouth daily.    . Simethicone (MYLANTA GAS PO) Take by mouth.    . tamsulosin (FLOMAX) 0.4 MG CAPS capsule Take 1 capsule (0.4 mg total) by mouth daily after supper. 30 capsule   . traZODone (DESYREL) 50 MG tablet Take 50 mg by mouth at bedtime.    . vitamin C (VITAMIN C) 250 MG tablet Take 1 tablet (250 mg total) by mouth 2 (two) times daily.     No current facility-administered medications for this visit.    Facility-Administered Medications Ordered in Other Visits  Medication Dose Route Frequency Provider Last Rate Last Dose  . CARBOplatin (PARAPLATIN) 260 mg in sodium chloride 0.9 % 250 mL chemo infusion  260 mg Intravenous Once Earlie Server, MD      . gemcitabine (GEMZAR) 1,800 mg in sodium chloride 0.9 % 250 mL chemo infusion  1,800 mg Intravenous Once Earlie Server, MD      . heparin lock flush 100 unit/mL  500 Units Intravenous Once Earlie Server, MD      . sodium chloride flush (  NS) 0.9 % injection 10 mL  10 mL Intravenous PRN Earlie Server, MD   10 mL at 11/18/17 0931     PHYSICAL EXAMINATION: ECOG PERFORMANCE STATUS: 2 - Symptomatic, <50% confined to bed Vitals:   11/18/17 0931  BP:  129/73  Pulse: 69  Resp: 16  Temp: (!) 96.9 F (36.1 C)   Filed Weights   11/18/17 0931  Weight: 154 lb (69.9 kg)    Physical Exam  Constitutional: He is oriented to person, place, and time. No distress.  HENT:  Head: Normocephalic and atraumatic.  Mouth/Throat: Oropharynx is clear and moist. No oropharyngeal exudate.  Eyes: Pupils are equal, round, and reactive to light. EOM are normal. No scleral icterus.  Pale conjunctivae  Neck: Normal range of motion. Neck supple.  Cardiovascular: Normal rate, regular rhythm and normal heart sounds.  No murmur heard. Pulmonary/Chest: Effort normal and breath sounds normal. No respiratory distress. He has no wheezes.  Abdominal: Soft. Bowel sounds are normal. He exhibits no distension and no mass. There is no tenderness.  Genitourinary:  Genitourinary Comments: Bilateral percutaneous nephrostomy tubes with urine in the nephrostomy bags.  Musculoskeletal: Normal range of motion. He exhibits no edema or deformity.  Neurological: He is alert and oriented to person, place, and time. No cranial nerve deficit. Coordination normal.  Skin: Skin is warm and dry. No rash noted. No erythema.  Psychiatric: He has a normal mood and affect. His behavior is normal. Thought content normal.     LABORATORY DATA:  I have reviewed the data as listed Lab Results  Component Value Date   WBC 10.8 (H) 11/18/2017   HGB 9.2 (L) 11/18/2017   HCT 28.2 (L) 11/18/2017   MCV 86.0 11/18/2017   PLT 264 11/18/2017   Recent Labs    11/04/17 2007  11/11/17 1228 11/15/17 0838 11/18/17 0931  NA 127*   < > 136 136 135  K 4.6   < > 3.9 4.5 4.4  CL 98   < > 102 104 103  CO2 17*   < > 26 21* 21*  GLUCOSE 228*   < > 226* 285* 171*  BUN 70*   < > 23 33* 33*  CREATININE 7.89*   < > 2.21* 2.37* 2.16*  CALCIUM 8.2*   < > 8.6* 9.5 9.4  GFRNONAA 6*   < > 29* 27* 30*  GFRAA 7*   < > 34* 31* 35*  PROT 6.9  --   --  8.1 7.9  ALBUMIN 2.5*  --   --  3.0* 3.1*  AST 14*  --    --  32 28  ALT 12  --   --  23 24  ALKPHOS 99  --   --  104 115  BILITOT 0.5  --   --  0.4 0.4   < > = values in this interval not displayed.   Iron/TIBC/Ferritin/ %Sat    Component Value Date/Time   IRON 95 11/15/2017 0841   IRON 44 (L) 07/16/2011 1530   TIBC 262 11/15/2017 0841   TIBC 407 07/16/2011 1530   FERRITIN 226 11/15/2017 0841   FERRITIN 9 07/16/2011 1530   IRONPCTSAT 36 11/15/2017 0841   IRONPCTSAT 11 07/16/2011 1530     Repeat CT renal stone study was independently reviewed by me and discussed with patient. New findings of small non-well-characterized liver hypodensity lesion 5 mm and appears that he has local invasion of rectum, clinically T4 disease.   ASSESSMENT & PLAN:  1. Urothelial cancer ([place])   2. Chronic anticoagulation   3. Anemia, unspecified type   4. Encounter for antineoplastic chemotherapy    #Locally advanced urothelial carcinoma, Counts are reviewed and discussed with patient.  Acceptable to proceed with cycle 1 systemic chemotherapy treatment with carboplatin and gemcitabine. He will see radiation oncology and start simulation this week.  Once he is ready for concurrent chemoradiation, we will switch to concurrent chemotherapy regimen. The systemic chemotherapy hopefully will provide some coverage while he is waiting for getting radiation started. High risk of becoming metastatic disease in the near future. Given his general performance status, multiple comorbidities, I will start with carboplatin AUC of 4.5.  Gemcitabine 1000 mg/m He needs to follow-up in the clinic in 1 week for assessment of tolerability.   #Chronic A. Fib, continue chronic anticoagulation with Xarelto 15 mg daily..  On amiodarone and digoxin. #Anemia, hemoglobin 9.2.  Iron level reviewed and discussed with patient.  Not consistent with iron deficiency.  Anemia most likely secondary to chronic disease  # Personal history of both colon cancer and urothelia cancer, we have  referred him to genetic counselor and genetic testing will be sent. # NGS pending. #Insulin-dependent diabetes, on Lantus and NovoLog with NovoLog sliding scale.  We discussed that patient's glucose may increase while on chemotherapy/steroids. Advised patient to have close monitoring of her blood sugar and cover with insulin.   All questions were answered. The patient knows to call the clinic with any problems questions or concerns.  Return of visit: 1 week Total face to face encounter time for this patient visit was 28min. >50% of the time was  spent in counseling and coordination of care.   Earlie Server, MD, PhD Hematology Oncology [facility] Pager- [phone] 11/18/2017

## 2017-11-18 NOTE — Progress Notes (Signed)
[place]  [phone] [phone]   Name: Jordan [name] Date: 11/18/2017 MRN: [id number]  DOB: 1950-10-01  Patient Care Team: Albina Billet, MD as PCP - General (Internal Medicine)    REASON FOR CONSULTATION: Palliative Care consult requested for this 67 y.o. male with multiple medical problems including locally advanced bladder cancer (diagnosed August 2019) currently being treated with carboplatin and gemcitabine.  PMH is also notable for patient was recently hospitalized in August 2019 for bilateral hydronephrosis and acute renal failure requiring hemodialysis due to bilateral ureteral obstruction.  Bilateral nephrostomy tubes were placed inpatient underwent TURBT.  Patient was hospitalized again in September 2019 with UTI and acute on chronic CKD secondary to occluded ureteral stents.  She has been receiving care at rehab.  She was referred to palliative care to provide support as he undergoes treatment.   SOCIAL HISTORY:    She is a widower.  He has no children.  He has several brothers and a sister who are involved in his care.  [facility] is currently at [facility] receiving [facility].  ADVANCE DIRECTIVES:  Sister is POA.  Patient does not have a living will.  CODE STATUS: DNR  PAST MEDICAL HISTORY: Past Medical History:  Diagnosis Date  . Atrial fibrillation ([place])   . Cancer ([place])    a. 06/2011 b. s/p right hemicolectomy/ colon CA  . Colon polyps   . Dysrhythmia   . Family history of cancer   . Hypertension   . Hyperthyroidism    a. 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 Nursing home resident   . Stroke ([place]) 03/2012   a. residual right sided weakness  . Type 1 diabetes ([place])    a. on insulin b. prior DKA     PAST SURGICAL HISTORY:  Past Surgical History:  Procedure Laterality Date  . CARDIOVERSION N/A 03/25/2014   Procedure: CARDIOVERSION;  Surgeon: Lelon Perla, MD;   Location: [facility] ENDOSCOPY;  Service: Cardiovascular;  Laterality: N/A;  . COLONOSCOPY    . COLONOSCOPY WITH PROPOFOL N/A 07/24/2017   Procedure: COLONOSCOPY WITH PROPOFOL;  Surgeon: Toledo, Benay Pike, MD;  Location: [facility] ENDOSCOPY;  Service: Gastroenterology;  Laterality: N/A;  . CYSTOSCOPY W/ RETROGRADES Bilateral 10/23/2017   Procedure: CYSTOSCOPY WITH RETROGRADE PYELOGRAM;  Surgeon: Hollice Espy, MD;  Location: [facility] ORS;  Service: Urology;  Laterality: Bilateral;  . CYSTOSCOPY WITH RETROGRADE URETHROGRAM Bilateral 10/23/2017   Procedure: CYSTOSCOPY WITH antegrade nephrostogram;  Surgeon: Hollice Espy, MD;  Location: [facility] ORS;  Service: Urology;  Laterality: Bilateral;  . CYSTOSCOPY WITH STENT PLACEMENT Bilateral 10/23/2017   Procedure: CYSTOSCOPY WITH STENT PLACEMENT;  Surgeon: Hollice Espy, MD;  Location: [facility] ORS;  Service: Urology;  Laterality: Bilateral;  . HEMICOLECTOMY Right   . IR NEPHROSTOMY EXCHANGE LEFT  11/08/2017  . IR NEPHROSTOMY EXCHANGE RIGHT  11/08/2017  . IR NEPHROSTOMY PLACEMENT LEFT  09/22/2017  . IR NEPHROSTOMY PLACEMENT RIGHT  09/22/2017  . PORTA CATH INSERTION N/A 11/07/2017   Procedure: PORTA CATH INSERTION;  Surgeon: Algernon Huxley, MD;  Location: [facility] CV LAB;  Service: Cardiovascular;  Laterality: N/A;  . TRANSURETHRAL RESECTION OF BLADDER TUMOR N/A 10/23/2017   Procedure: TRANSURETHRAL RESECTION OF BLADDER TUMOR (TURBT);  Surgeon: Hollice Espy, MD;  Location: [facility] ORS;  Service: Urology;  Laterality: N/A;    HEMATOLOGY/ONCOLOGY HISTORY:    Urothelial cancer ([place])   11/03/2017 Initial Diagnosis    Urothelial cancer (  [place])    11/03/2017 - 11/10/2017 Chemotherapy    The patient had gemcitabine (GEMZAR) 190 mg in sodium chloride 0.9 % 100 mL chemo infusion, 100 mg/m2 = 190 mg (original dose ), Intravenous,  Once, 0 of 4 cycles Dose modification: 100 mg/m2 (Cycle 1, Reason: Other (see comments))  for chemotherapy treatment.     11/10/2017 - 11/10/2017  Chemotherapy    The patient had gemcitabine (GEMZAR) 1,862 mg in sodium chloride 0.9 % 100 mL chemo infusion, 1,000 mg/m2, Intravenous,  Once, 0 of 6 cycles  for chemotherapy treatment.     11/15/2017 -  Chemotherapy    The patient had palonosetron (ALOXI) injection 0.25 mg, 0.25 mg, Intravenous,  Once, 1 of 4 cycles Administration: 0.25 mg (11/18/2017) CARBOplatin (PARAPLATIN) 260 mg in sodium chloride 0.9 % 250 mL chemo infusion, 260 mg (105.5 % of original dose 247.05 mg), Intravenous,  Once, 1 of 4 cycles Dose modification:   (original dose 247.05 mg, Cycle 1) Administration: 260 mg (11/18/2017) gemcitabine (GEMZAR) 1,800 mg in sodium chloride 0.9 % 250 mL chemo infusion, 1,862 mg, Intravenous,  Once, 1 of 4 cycles Administration: 1,800 mg (11/18/2017)  for chemotherapy treatment.      ALLERGIES:  is allergic to amiodarone.  MEDICATIONS:  Current Outpatient Medications  Medication Sig Dispense Refill  . acetaminophen (TYLENOL) 325 MG tablet Take 2 tablets (650 mg total) by mouth every 6 (six) hours as needed for mild pain (or Fever >/= 101).    Marland Kitchen amiodarone (PACERONE) 200 MG tablet Take 200 mg by mouth daily.  0  . amLODipine (NORVASC) 10 MG tablet Take 10 mg by mouth daily.    Marland Kitchen atorvastatin (LIPITOR) 40 MG tablet Take 40 mg by mouth daily.    . digoxin 62.5 MCG TABS Take 0.0625 mg by mouth daily.    Marland Kitchen docusate sodium (COLACE) 100 MG capsule Take 1 capsule (100 mg total) by mouth 2 (two) times daily. 60 capsule 0  . famotidine (PEPCID) 20 MG tablet Take 1 tablet (20 mg total) by mouth daily.    . folic acid (FOLVITE) 1 MG tablet Take 1 mg by mouth daily.    Marland Kitchen HYDROcodone-acetaminophen (NORCO/VICODIN) 5-325 MG tablet Take 1-2 tablets by mouth every 6 (six) hours as needed for moderate pain. 20 tablet 0  . insulin aspart (NOVOLOG) 100 UNIT/ML injection Inject 10 Units into the skin 3 (three) times daily with meals. (Patient taking differently: Inject 13 Units into the skin 3 (three)  times daily with meals. ) 10 mL 11  . insulin aspart (NOVOLOG) 100 UNIT/ML injection Inject 0-15 Units into the skin 4 (four) times daily -  before meals and at bedtime. CBG < 70: implement hypoglycemia protocol CBG 70 - 120: 0 units CBG 121 - 150: 2 units CBG 151 - 200: 3 units CBG 201 - 250: 5 units CBG 251 - 300: 8 units CBG 301 - 350: 11 units CBG 351 - 400: 15 units CBG > 400: call MD and obtain STAT lab verification 10 mL 11  . insulin glargine (LANTUS) 100 UNIT/ML injection Inject 0.37 mLs (37 Units total) into the skin daily. 10 mL 11  . lidocaine-prilocaine (EMLA) cream Apply to affected area once 30 g 3  . loratadine (CLARITIN) 10 MG tablet Take 1 tablet (10 mg total) by mouth daily.    . metoprolol tartrate (LOPRESSOR) 25 MG tablet Take 1 tablet (25 mg total) by mouth 2 (two) times daily.    . Multiple Vitamin (MULTIVITAMIN WITH  MINERALS) TABS tablet Take 1 tablet by mouth daily.    . ondansetron (ZOFRAN) 8 MG tablet Take 1 tablet (8 mg total) by mouth 2 (two) times daily as needed for refractory nausea / vomiting. Start on day 3 after carboplatin chemo. 30 tablet 1  . oxybutynin (DITROPAN) 5 MG tablet Take 1 tablet (5 mg total) by mouth every 8 (eight) hours as needed for bladder spasms. 30 tablet 0  . polyethylene glycol (MIRALAX / GLYCOLAX) packet Take 17 g by mouth daily.    . prochlorperazine (COMPAZINE) 10 MG tablet Take 1 tablet (10 mg total) by mouth every 6 (six) hours as needed (Nausea or vomiting). 30 tablet 1  . protein supplement shake (PREMIER PROTEIN) LIQD Take 325 mLs (11 oz total) by mouth 2 (two) times daily between meals.  0  . Rivaroxaban (XARELTO) 15 MG TABS tablet Take 15 mg by mouth daily.    . rosuvastatin (CRESTOR) 5 MG tablet Take 5 mg by mouth daily.    Marland Kitchen senna-docusate (SENOKOT-S) 8.6-50 MG tablet Take 1 tablet by mouth daily.    . Simethicone (MYLANTA GAS PO) Take by mouth.    . tamsulosin (FLOMAX) 0.4 MG CAPS capsule Take 1 capsule (0.4 mg total) by  mouth daily after supper. 30 capsule   . traZODone (DESYREL) 50 MG tablet Take 50 mg by mouth at bedtime.    . vitamin C (VITAMIN C) 250 MG tablet Take 1 tablet (250 mg total) by mouth 2 (two) times daily.     No current facility-administered medications for this visit.    Facility-Administered Medications Ordered in Other Visits  Medication Dose Route Frequency Provider Last Rate Last Dose  . sodium chloride flush (NS) 0.9 % injection 10 mL  10 mL Intravenous PRN Earlie Server, MD   10 mL at 11/18/17 0931    VITAL SIGNS: There were no vitals taken for this visit. There were no vitals filed for this visit.  Estimated body mass index is 22.74 kg/m as calculated from the following:   Height as of 11/05/17: 5' 9" (1.753 m).   Weight as of an earlier encounter on 11/18/17: 154 lb (69.9 kg).  LABS: CBC:    Component Value Date/Time   WBC 10.8 (H) 11/18/2017 0931   HGB 9.2 (L) 11/18/2017 0931   HGB 10.9 (L) 06/11/2012 0400   HCT 28.2 (L) 11/18/2017 0931   HCT 32.0 (L) 06/11/2012 0400   PLT 264 11/18/2017 0931   PLT 353 06/11/2012 0400   MCV 86.0 11/18/2017 0931   MCV 90 06/11/2012 0400   NEUTROABS 8.1 (H) 11/18/2017 0931   NEUTROABS 19.0 (H) 06/11/2012 0400   LYMPHSABS 1.8 11/18/2017 0931   LYMPHSABS 0.9 (L) 06/11/2012 0400   MONOABS 0.7 11/18/2017 0931   MONOABS 1.7 (H) 06/11/2012 0400   EOSABS 0.2 11/18/2017 0931   EOSABS 0.0 06/11/2012 0400   BASOSABS 0.0 11/18/2017 0931   BASOSABS 0.0 06/11/2012 0400   Comprehensive Metabolic Panel:    Component Value Date/Time   NA 135 11/18/2017 0931   NA 142 06/12/2012 0508   K 4.4 11/18/2017 0931   K 3.8 06/12/2012 0508   CL 103 11/18/2017 0931   CL 113 (H) 06/12/2012 0508   CO2 21 (L) 11/18/2017 0931   CO2 23 06/12/2012 0508   BUN 33 (H) 11/18/2017 0931   BUN 17 06/12/2012 0508   CREATININE 2.16 (H) 11/18/2017 0931   CREATININE 1.02 06/12/2012 0508   GLUCOSE 171 (H) 11/18/2017 7494  GLUCOSE 138 (H) 06/12/2012 0508   CALCIUM  9.4 11/18/2017 0931   CALCIUM 8.2 (L) 06/12/2012 0508   AST 28 11/18/2017 0931   AST 21 06/10/2012 1106   ALT 24 11/18/2017 0931   ALT 22 06/10/2012 1106   ALKPHOS 115 11/18/2017 0931   ALKPHOS 91 06/10/2012 1106   BILITOT 0.4 11/18/2017 0931   BILITOT 0.5 06/10/2012 1106   PROT 7.9 11/18/2017 0931   PROT 6.3 (L) 06/10/2012 1106   ALBUMIN 3.1 (L) 11/18/2017 0931   ALBUMIN 3.0 (L) 06/10/2012 1106    RADIOGRAPHIC STUDIES: Ct Chest Wo Contrast  Result Date: 10/30/2017 CLINICAL DATA:  Bladder cancer. EXAM: CT CHEST WITHOUT CONTRAST TECHNIQUE: Multidetector CT imaging of the chest was performed following the standard protocol without IV contrast. COMPARISON:  CT abdomen pelvis 09/22/2017 and CT chest 03/25/2017. FINDINGS: Cardiovascular: Coronary artery calcification. Heart size normal. No pericardial effusion. Mediastinum/Nodes: No pathologically enlarged mediastinal or axillary lymph nodes. Hilar regions are difficult to evaluate without IV contrast appear grossly unremarkable. Esophagus is grossly unremarkable. Lungs/Pleura: Minimal subpleural atelectasis or scarring in both lower lobes. Lungs are otherwise clear. No pleural fluid. Airway is unremarkable. Upper Abdomen: Subcentimeter low-attenuation lesion in the periphery of the right hepatic lobe is too small to characterize. Visualized portions of the adrenal glands are unremarkable. The proximal aspects of bilateral double-J ureteral stents are seen within dilated calices. Visualized portions of the spleen, pancreas, stomach and bowel are grossly unremarkable. Musculoskeletal: Degenerative changes in the spine. No worrisome lytic or sclerotic lesions. IMPRESSION: 1. No evidence of metastatic disease. 2. Bilateral hydronephrosis and double-J ureteral stents, partially imaged. 3. Coronary artery calcification. Electronically Signed   By: Lorin Picket M.D.   On: 10/30/2017 14:10   Dg Chest Port 1 View  Result Date: 10/25/2017 CLINICAL DATA:   Hyperglycemia. Hyperkalemia. Recent acute renal failure. EXAM: PORTABLE CHEST 1 VIEW COMPARISON:  09/26/2017 and 09/23/2017 radiographs. FINDINGS: 1834 hour. The heart size and mediastinal contours are normal. The lungs are clear. There is no pleural effusion or pneumothorax. No acute osseous findings are identified. There are probable bilateral ureteral stents. IMPRESSION: No active cardiopulmonary process. Electronically Signed   By: Richardean Sale M.D.   On: 10/25/2017 18:52   Ct Renal Stone Study  Result Date: 11/04/2017 CLINICAL DATA:  Pt has some confusion per ems and this is his baseline. Pt had lab work done and his creatinine was elevated and his hemoglobin has dropped. Pt has hx of GU cancer. Pt has Foley cath in place and the urine appears red. Patient underwent trans urethral resection of bladder tumor on 10/23/2017. EXAM: CT ABDOMEN AND PELVIS WITHOUT CONTRAST TECHNIQUE: Multidetector CT imaging of the abdomen and pelvis was performed following the standard protocol without IV contrast. COMPARISON:  09/22/2017 FINDINGS: Lower chest: Small bilateral pleural effusions and bibasilar atelectasis. Heart size is normal. There is coronary artery calcification. Hepatobiliary: A 5 millimeter low-attenuation lesion is identified in the RIGHT hepatic lobe, not further characterized. No radiopaque gallstones, biliary dilatation, or pericholecystic inflammatory changes. Pancreas: Unremarkable. No pancreatic ductal dilatation or surrounding inflammatory changes. Spleen: Normal in size without focal abnormality. Adrenals/Urinary Tract: Adrenal glands are normal in appearance. There is bilateral hydronephrosis. There are bilateral percutaneous nephrostomies. There are bilateral double-J ureteral stents traversing dilated ureters. A Foley catheter is in place. The degree of hydronephrosis is similar compared with previous exams. Stomach/Bowel: The stomach is normal in appearance. Small bowel loops are unremarkable.  Status post ascending colectomy. Moderate stool within the transverse  colon and sigmoid colon. There is thickening of the rectal wall, associated with stranding in the perirectal fat. Soft tissue density along the LEFT aspect of the rectum measures 2.1 x 5.1 centimeters and may represent inflammatory change or tumor. There is thickening along the distal portions of the ureters bilaterally, LEFT greater than RIGHT. Vascular/Lymphatic: There is atherosclerotic calcification of the abdominal aorta. No associated aneurysm. Enlarged periaortic lymph nodes are identified, measuring up to 11 millimeters at the level of the renal veins. Reproductive: Prostate is unremarkable. Other: No abdominal wall hernia or abnormality. No abdominopelvic ascites. Musculoskeletal: Degenerative changes are identified primarily at L5-S1. Degenerative changes are seen in both hips. IMPRESSION: 1. Bilateral percutaneous nephrostomies and bilateral ureteral stents appear in good position. 2. Degree of hydronephrosis appears similar to the previous exam, prior to stent placement. 3. Foley catheter decompresses the bladder. 4. Thickening of the rectum, consistent with infectious, inflammatory change, or tumor. Soft tissue along the LEFT aspect of the rectum likely represents tumor. 5. Coronary artery calcification. 6.  Aortic atherosclerosis.  (ICD10-I70.0) 7. Status post ascending colectomy. 8. Small bilateral effusions. Electronically Signed   By: Nolon Nations M.D.   On: 11/04/2017 20:11   Ir Nephrostomy Exchange Left  Result Date: 11/08/2017 CLINICAL DATA:  Urethral cancer. Bilateral indwelling percutaneous nephrostomy catheters, presents for routine exchange. No current issues. EXAM: BILATERAL PERCUTANEOUS NEPHROSTOMY CATHETER EXCHANGE UNDER FLUOROSCOPY FLUOROSCOPY TIME:  1.9 minute; 12 mGy TECHNIQUE: The nephrostomy tubes and surrounding skin were prepped with Betadine, draped in usual sterile fashion. A small amount of contrast was  injected through the right nephrostomy catheter to opacify the renal collecting system. The catheter was in a peripheral calyx. The catheter was cut and exchanged over a 0.035" angiographic wire. A Kumpe catheter was required to regain access to the central aspect of the renal collecting system. Kumpe was then exchanged for a new 10-French pigtail catheter, formed centrally within the collecting system under fluoroscopy. Contrast injection confirms appropriate positioning. In a similar fashion, the left nephrostomy catheter was injected, cut, and exchanged for a new 10-French pigtail catheter, formed centrally within the left renal collecting system. Injection confirms appropriate positioning and patency. Both catheters were secured externally with a Statlock devices and additional 0-Prolene sutures. The patient tolerated the procedure well. COMPLICATIONS: None immediate IMPRESSION: 1. Technically successful exchange of bilateral nephrostomy catheters under fluoroscopy Electronically Signed   By: Lucrezia Europe M.D.   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 No current issues. EXAM: BILATERAL PERCUTANEOUS NEPHROSTOMY CATHETER EXCHANGE UNDER FLUOROSCOPY FLUOROSCOPY TIME:  1.9 minute; 12 mGy TECHNIQUE: The nephrostomy tubes and surrounding skin were prepped with Betadine, draped in usual sterile fashion. A small amount of contrast was injected through the right nephrostomy catheter to opacify the renal collecting system. The catheter was in a peripheral calyx. The catheter was cut and exchanged over a 0.035" angiographic wire. A Kumpe catheter was required to regain access to the central aspect of the renal collecting system. Kumpe was then exchanged for a new 10-French pigtail catheter, formed centrally within the collecting system under fluoroscopy. Contrast  injection confirms appropriate positioning. In a similar fashion, the left nephrostomy catheter was injected, cut, and exchanged for a new 10-French pigtail catheter, formed centrally within the left renal collecting system. Injection confirms appropriate positioning and patency. Both catheters were secured externally with a Statlock devices and additional 0-Prolene sutures.  The patient tolerated the procedure well. COMPLICATIONS: None immediate IMPRESSION: 1. Technically successful exchange of bilateral nephrostomy catheters under fluoroscopy Electronically Signed   By: Lucrezia Europe M.D.   On: 11/08/2017 09:34    PERFORMANCE STATUS (ECOG) : 4 - Bedbound  Review of Systems As noted above. Otherwise, a complete review of systems is negative.  Physical Exam General: NAD, frail appearing, thin Cardiovascular: regular rate and rhythm Pulmonary: clear ant fields Abdomen: soft, nontender, + bowel sounds GU: urostomy tubes noted but not visualized Extremities: no edema, no joint deformities Skin: no rashes Neurological: R>L. Sided weakness, speech clear, follows commands, alert and oriented. Answers questions  IMPRESSION: I met today with patient and his sister.  [facility] since his diagnosis with cancer in August 2019.  Patient's sister suggests that he has not demonstrated significant functional improvement in that period of time.  At baseline, patient is now mostly in the wheelchair or bed and only able to take a couple steps with assistance.  Prior to his initial hospitalization, patient was living at home alone and was independent with most of his activities of daily living.  Patient only received assistance with grocery shopping, mowing of the grass, transportation, and other household chores.  We discussed the possibility that patient's weakness would be persistent and the patient might be at a new functional baseline.  Patient's current goals are aligned with continued  treatment.  We discussed advance directives today.  He would want his sister to help with decision-making if needed.  Patient does not have a living will.   I completed a MOST form today. The patient and family outlined their wishes for the following treatment decisions:  Cardiopulmonary Resuscitation: Do Not Attempt Resuscitation (DNR/No CPR)  Medical Interventions: Limited Additional Interventions: Use medical treatment, IV fluids and cardiac monitoring as indicated, DO NOT USE intubation or mechanical ventilation. May consider use of less invasive airway support such as BiPAP or CPAP. Also provide comfort measures. Transfer to the hospital if indicated. Avoid intensive care.   Antibiotics: Determine use of limitation of antibiotics when infection occurs  IV Fluids: IV fluids for a defined trial period  Feeding Tube: No feeding tube    PLAN: 1.  Continue supportive care 2.  MOST form completed as outlined above 3.  DNR 4.  RTC at time of next appointment with Dr. Tasia Catchings.  We will continue conversations regarding goals  Patient expressed understanding and was in agreement with this plan. He also understands that He can call clinic at any time with any questions, concerns, or complaints.    Time Total: 20 minutes  Visit consisted of counseling and education dealing with the complex and emotionally intense issues of symptom management and palliative care in the setting of serious and potentially life-threatening illness.Greater than 50%  of this time was spent counseling and coordinating care related to the above assessment and plan.  Signed by: Altha Harm, [place], NP-C, [place] ([facility])

## 2017-11-19 ENCOUNTER — Other Ambulatory Visit: Payer: Self-pay | Admitting: *Deleted

## 2017-11-19 ENCOUNTER — Other Ambulatory Visit: Payer: Self-pay | Admitting: Oncology

## 2017-11-19 ENCOUNTER — Ambulatory Visit
Admission: RE | Admit: 2017-11-19 | Discharge: 2017-11-19 | Disposition: A | Discharge status: Home / Self Care | Payer: Medicare Other | Source: Ambulatory Visit | Attending: Radiation Oncology | Admitting: Radiation Oncology

## 2017-11-19 DIAGNOSIS — Z510 Encounter for antineoplastic radiation therapy: Secondary | ICD-10-CM | POA: Diagnosis present

## 2017-11-19 DIAGNOSIS — C679 Malignant neoplasm of bladder, unspecified: Secondary | ICD-10-CM | POA: Diagnosis present

## 2017-11-19 MED ORDER — DRONABINOL 5 MG PO CAPS: 5.0000 mg | ORAL_CAPSULE | Freq: Two times a day (BID) | ORAL | 0 refills | Status: DC

## 2017-11-19 MED ORDER — DRONABINOL 5 MG PO CAPS: 5.0000 mg | ORAL_CAPSULE | Freq: Two times a day (BID) | ORAL | 0 refills | Status: AC

## 2017-11-20 DIAGNOSIS — Z510 Encounter for antineoplastic radiation therapy: Secondary | ICD-10-CM | POA: Diagnosis not present

## 2017-11-21 ENCOUNTER — Ambulatory Visit: Payer: Medicare Other | Admitting: Urology

## 2017-11-25 ENCOUNTER — Ambulatory Visit
Admission: RE | Admit: 2017-11-25 | Discharge: 2017-11-25 | Disposition: A | Discharge status: Home / Self Care | Payer: Medicare Other | Source: Ambulatory Visit | Attending: Radiation Oncology | Admitting: Radiation Oncology

## 2017-11-25 ENCOUNTER — Encounter: Payer: Self-pay | Admitting: Oncology

## 2017-11-25 ENCOUNTER — Inpatient Hospital Stay: Payer: Medicare Other

## 2017-11-25 ENCOUNTER — Other Ambulatory Visit: Payer: Self-pay

## 2017-11-25 ENCOUNTER — Inpatient Hospital Stay (HOSPITAL_BASED_OUTPATIENT_CLINIC_OR_DEPARTMENT_OTHER): Payer: Medicare Other | Admitting: Oncology

## 2017-11-25 ENCOUNTER — Other Ambulatory Visit: Payer: Self-pay | Admitting: Oncology

## 2017-11-25 VITALS — BP 149/69 | HR 72 | Temp 96.6°F | Wt 153.0 lb

## 2017-11-25 DIAGNOSIS — I480 Paroxysmal atrial fibrillation: Secondary | ICD-10-CM

## 2017-11-25 DIAGNOSIS — Z992 Dependence on renal dialysis: Secondary | ICD-10-CM | POA: Diagnosis not present

## 2017-11-25 DIAGNOSIS — Z7901 Long term (current) use of anticoagulants: Secondary | ICD-10-CM

## 2017-11-25 DIAGNOSIS — D649 Anemia, unspecified: Secondary | ICD-10-CM | POA: Diagnosis not present

## 2017-11-25 DIAGNOSIS — Z79899 Other long term (current) drug therapy: Secondary | ICD-10-CM

## 2017-11-25 DIAGNOSIS — Z936 Other artificial openings of urinary tract status: Secondary | ICD-10-CM | POA: Diagnosis not present

## 2017-11-25 DIAGNOSIS — R197 Diarrhea, unspecified: Secondary | ICD-10-CM

## 2017-11-25 DIAGNOSIS — Z5111 Encounter for antineoplastic chemotherapy: Secondary | ICD-10-CM | POA: Diagnosis not present

## 2017-11-25 DIAGNOSIS — C679 Malignant neoplasm of bladder, unspecified: Secondary | ICD-10-CM | POA: Diagnosis not present

## 2017-11-25 DIAGNOSIS — C689 Malignant neoplasm of urinary organ, unspecified: Secondary | ICD-10-CM

## 2017-11-25 DIAGNOSIS — Z510 Encounter for antineoplastic radiation therapy: Secondary | ICD-10-CM | POA: Diagnosis not present

## 2017-11-25 DIAGNOSIS — N179 Acute kidney failure, unspecified: Secondary | ICD-10-CM

## 2017-11-25 LAB — CBC WITH DIFFERENTIAL/PLATELET
Abs Immature Granulocytes: 0.05 10*3/uL (ref 0.00–0.07)
BASOS PCT: 0 %
Basophils Absolute: 0 10*3/uL (ref 0.0–0.1)
EOS ABS: 0 10*3/uL (ref 0.0–0.5)
EOS PCT: 1 %
HCT: 23.7 % — ABNORMAL LOW (ref 39.0–52.0)
HEMOGLOBIN: 7.8 g/dL — AB (ref 13.0–17.0)
Immature Granulocytes: 1 %
LYMPHS ABS: 1.1 10*3/uL (ref 0.7–4.0)
Lymphocytes Relative: 16 %
MCH: 28 pg (ref 26.0–34.0)
MCHC: 32.9 g/dL (ref 30.0–36.0)
MCV: 84.9 fL (ref 80.0–100.0)
MONO ABS: 0.6 10*3/uL (ref 0.1–1.0)
MONOS PCT: 9 %
Neutro Abs: 4.9 10*3/uL (ref 1.7–7.7)
Neutrophils Relative %: 73 %
PLATELETS: 111 10*3/uL — AB (ref 150–400)
RBC: 2.79 MIL/uL — ABNORMAL LOW (ref 4.22–5.81)
RDW: 13.2 % (ref 11.5–15.5)
WBC: 6.7 10*3/uL (ref 4.0–10.5)
nRBC: 0 % (ref 0.0–0.2)

## 2017-11-25 LAB — COMPREHENSIVE METABOLIC PANEL
ALBUMIN: 3 g/dL — AB (ref 3.5–5.0)
ALK PHOS: 105 U/L (ref 38–126)
ALT: 18 U/L (ref 0–44)
AST: 19 U/L (ref 15–41)
Anion gap: 8 (ref 5–15)
BILIRUBIN TOTAL: 0.5 mg/dL (ref 0.3–1.2)
BUN: 24 mg/dL — AB (ref 8–23)
CALCIUM: 8.7 mg/dL — AB (ref 8.9–10.3)
CO2: 24 mmol/L (ref 22–32)
CREATININE: 1.64 mg/dL — AB (ref 0.61–1.24)
Chloride: 101 mmol/L (ref 98–111)
GFR calc Af Amer: 48 mL/min — ABNORMAL LOW (ref >60)
GFR calc non Af Amer: 42 mL/min — ABNORMAL LOW (ref >60)
Glucose, Bld: 235 mg/dL — ABNORMAL HIGH (ref 70–99)
Potassium: 4.3 mmol/L (ref 3.5–5.1)
Sodium: 133 mmol/L — ABNORMAL LOW (ref 135–145)
TOTAL PROTEIN: 7.5 g/dL (ref 6.5–8.1)

## 2017-11-25 MED ORDER — HEPARIN SOD (PORK) LOCK FLUSH 100 UNIT/ML IV SOLN: 500.0000 [IU] | Freq: Once | INTRAVENOUS | Status: AC

## 2017-11-25 MED ORDER — SODIUM CHLORIDE 0.9% FLUSH
10.0000 mL | Freq: Once | INTRAVENOUS | Status: AC
  Administered 2017-11-25: 10 mL via INTRAVENOUS
  Filled 2017-11-25: qty 10

## 2017-11-25 NOTE — Progress Notes (Signed)
Patient here today for follow up and chemo treatment

## 2017-11-25 NOTE — Progress Notes (Signed)
Hematology/Oncology Follow up note [facility] [phone] [phone]   Patient Care Team: Albina Billet, MD as PCP - General (Internal Medicine)  REFERRING PROVIDER: Dr.Brandon REASON FOR VISIT Follow up for treatment of  bladder cancer  HISTORY OF PRESENTING ILLNESS:  Jordan [name] is a  67 y.o.  male with PMH listed below who was referred to me for evaluation of newly discovered bladder cancer.  He was recently admitted from 8/17-8/27 with bilateral hydronephrosis and acute renal failure requiring dialysis due to bilateral ureteral obstruction. Bilateral nephrostomy tubes were placed while he was inpatient. He followed up in the urology office and had a cystoscopy that was concerning for bladder. He underwent TURBT and bilateral ureteral stent placement on 10/23/17. Surgical pathology showed high grade carcinoma Image was independently revived by me.  09/22/2017 CT abdomen pelvis wo  Bilateral symmetric mild hydroureteronephrosis. Foley catheter present within a collapsed bladder as there is increased density material within the bladder possibly hemorrhagic debris. There is also increased density material over the left pelvis with some crossing midline adjacent both distal ureters left worse than right likely hemorrhagic debris. These factors may be responsible for the Hydroureteronephrosis.  Small bilateral pleural effusions with associated bibasilar atelectasis. 1.4 cm right renal cyst. Previous partial right colectomy.Left femoral venous catheter with tip over the external iliac vein near the junction to common iliac vein. 10/30/2017 CT chest wo contrast  1. No evidence of metastatic disease. 2. Bilateral hydronephrosis and double-J ureteral stents, partially imaged. 3. Coronary artery calcification  10/23/2017 Cystoscopy showed  diffuse nodular, edematous, and areas of papillary tumor involving the majority of the surface of the bladder, at least  70% of the surface area greater than 5 cm.  The trigone itself is also distorted and nodular. On both sides, there was a high-grade distal ureteral obstruction with a very small caliber ureter which started at the level of the iliacs and extended all the way down to level of the bladder.  This is highly concerning for extrinsic compression of the ureter.s/p TUBRT, ureteral stent placed.   # Type 1 Diabetes,poorly controlled, recently admitted due to DKA. Chronic Paroxysmal A fib, on reduced dose of Xarelto 15mg  daily. .  Bilateral hydronephrosis s/p percutaneous drainage tubes placed, TURBT and ureteral stenting.  Case was discussed on tumor board on 10/31/2017. Although patient did not have obvious metastatic disease, he does likely have locally advanced disease likely extending of the pelvic wall bilaterally which compresses his ureters. On both sides, there was a high-grade distal ureteral obstruction with a very small caliber ureter which started at the level of the iliacs and extended all the way down to level of the bladder.  This is highly concerning for extrinsic compression of the ureter.  #Chronic A. 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 Stage II  # He was admitted to the hospital due to acute on chronic kidney failure, found to be post obstruction due to the failure of ureteral stent.  Nephrostomy tubes were uncapped and the patient was aggressively hydrated.  Creatinine improved.  Patient was discharged to rehab facility He also appears to have UTI/cystitis and urine culture growing VRE and E. coli.  He was treated with IV antibiotics and is switched to oral antibiotics Keflex and Zyvox. Due to the admission, patient did not meet radiation oncology and has not been simulated yet. There was a CT renal stone study done 11/04/2017 during  this admission.  CT showed bilateral percutaneous nephrostomy and bilateral ureter stent appear in good  position.  Degree of hydronephrosis appears similar.  There is actually thickness of the rectum and soft tissue along the left aspect of the rectum likely represent tumor.  Small bilateral effusion. There is also 5 mm low-attenuation lesion in the right hepatic lobe not further characterized.  This appears to be a new finding during the interval.  INTERVAL HISTORY Jordan [name] is a 67 y.o. male who has above history reviewed by me today presents for follow-up visit for assessment prior to chemotherapy for locally advanced bladder cancer. # Fatigue: reports worsening fatigue. Chronic onset, perisistent, getting worse after last chemo., no associated symptoms.  #Chemotherapy, reports tolerates well.  No nausea vomiting.  No fever or chills. #Chronic diarrhea, patient reports 1-2 episodes every day of loose stools which started even before last cycle of chemotherapy.  Patient did not mention during his previous visits.  Sister is concerned and brought up. Not associated with any abdominal pain. Currently patient is in rehab facility. Does not have much appetite.  Nephrostomy bags draining well.  No pain.   Review of Systems  Constitutional: Positive for malaise/fatigue and weight loss. Negative for chills and fever.  HENT: Negative for nosebleeds and sore throat.   Eyes: Negative for double vision, photophobia and redness.  Respiratory: Negative for cough, shortness of breath and wheezing.   Cardiovascular: Negative for chest pain, palpitations, orthopnea and leg swelling.  Gastrointestinal: Negative for abdominal pain, blood in stool, diarrhea, melena, nausea and vomiting.  Genitourinary: Negative for dysuria.  Musculoskeletal: Negative for back pain, myalgias and neck pain.  Skin: Negative for itching and rash.  Neurological: Negative for dizziness, tingling and tremors.  Endo/Heme/Allergies: Negative for environmental allergies. Does not bruise/bleed easily.  Psychiatric/Behavioral:  Negative for depression and hallucinations.    MEDICAL HISTORY:  Past Medical History:  Diagnosis Date  . Atrial fibrillation ([place])   . Cancer ([place])    a. 06/2011 b. s/p right hemicolectomy/ colon CA  . Colon polyps   . Dysrhythmia   . Family history of cancer   . Hypertension   . Hyperthyroidism    a. 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 Nursing home resident   . Stroke ([place]) 03/2012   a. residual right sided weakness  . Type 1 diabetes ([place])    a. on insulin b. prior DKA     SURGICAL HISTORY: Past Surgical History:  Procedure Laterality Date  . CARDIOVERSION N/A 03/25/2014   Procedure: CARDIOVERSION;  Surgeon: Lelon Perla, MD;  [facility] ENDOSCOPY;  Service: Cardiovascular;  Laterality: N/A;  . COLONOSCOPY    . COLONOSCOPY WITH PROPOFOL N/A 07/24/2017   Procedure: COLONOSCOPY WITH PROPOFOL;  Surgeon: Toledo, Benay Pike, MD;  Location: [facility] ENDOSCOPY;  Service: Gastroenterology;  Laterality: N/A;  . CYSTOSCOPY W/ RETROGRADES Bilateral 10/23/2017   Procedure: CYSTOSCOPY WITH RETROGRADE PYELOGRAM;  Surgeon: Hollice Espy, MD;  Location: [facility] ORS;  Service: Urology;  Laterality: Bilateral;  . CYSTOSCOPY WITH RETROGRADE URETHROGRAM Bilateral 10/23/2017   Procedure: CYSTOSCOPY WITH antegrade nephrostogram;  Surgeon: Hollice Espy, MD;  Location: [facility] ORS;  Service: Urology;  Laterality: Bilateral;  . CYSTOSCOPY WITH STENT PLACEMENT Bilateral 10/23/2017   Procedure: CYSTOSCOPY WITH STENT PLACEMENT;  Surgeon: Hollice Espy, MD;  Location: [facility] ORS;  Service: Urology;  Laterality: Bilateral;  . HEMICOLECTOMY Right   . IR NEPHROSTOMY EXCHANGE LEFT  11/08/2017  . IR NEPHROSTOMY  EXCHANGE RIGHT  11/08/2017  . IR NEPHROSTOMY PLACEMENT LEFT  09/22/2017  . IR NEPHROSTOMY PLACEMENT RIGHT  09/22/2017  . PORTA CATH INSERTION N/A 11/07/2017   Procedure: PORTA CATH INSERTION;  Surgeon: Algernon Huxley, MD;  Location: [facility] CV LAB;  Service: Cardiovascular;   Laterality: N/A;  . TRANSURETHRAL RESECTION OF BLADDER TUMOR N/A 10/23/2017   Procedure: TRANSURETHRAL RESECTION OF BLADDER TUMOR (TURBT);  Surgeon: Hollice Espy, MD;  Location: [facility] ORS;  Service: Urology;  Laterality: N/A;    SOCIAL HISTORY: Social History   Socioeconomic History  . Marital status: Single    Spouse name: Not on file  . Number of children: 0  . Years of education: Not on file  . Highest education level: Not on file  Occupational History  . Not on file  Social Needs  . Financial resource strain: Not hard at all  . Food insecurity:    Worry: Never true    Inability: Never true  . Transportation needs:    Medical: Yes    Non-medical: Yes  Tobacco Use  . Smoking status: Former Smoker    Years: 10.00    Types: Cigarettes    Last attempt to quit: 09/21/1973    Years since quitting: 44.2  . Smokeless tobacco: Never Used  Substance and Sexual Activity  . Alcohol use: No  . Drug use: No  . Sexual activity: Not Currently  Lifestyle  . Physical activity:    Days per week: Patient refused    Minutes per session: Patient refused  . Stress: Not on file  Relationships  . Social connections:    Talks on phone: Once a week    Gets together: Once a week    Attends religious service: 1 to 4 times per year    Active member of club or organization: No    Attends meetings of clubs or organizations: Never    Relationship status: Never married  . Intimate partner violence:    Fear of current or ex partner: Patient refused    Emotionally abused: Patient refused    Physically abused: Patient refused    Forced sexual activity: Patient refused  Other Topics Concern  . Not on file  Social History Narrative  . Not on file    FAMILY HISTORY: Family History  Problem Relation Age of Onset  . Heart attack Mother        deceased 9  . Diabetes Mother   . Hypertension Mother   . Heart attack Father        deceased 35  . Colon cancer Brother 81       currently 42    . Lung cancer Brother        smoker; deceased 47  . Cancer Maternal Aunt        unk. primary; deceased 57    ALLERGIES:  is allergic to amiodarone.  MEDICATIONS:  Current Outpatient Medications  Medication Sig Dispense Refill  . acetaminophen (TYLENOL) 325 MG tablet Take 2 tablets (650 mg total) by mouth every 6 (six) hours as needed for mild pain (or Fever >/= 101).    Marland Kitchen amiodarone (PACERONE) 200 MG tablet Take 200 mg by mouth daily.  0  . amLODipine (NORVASC) 10 MG tablet Take 10 mg by mouth daily.    Marland Kitchen atorvastatin (LIPITOR) 40 MG tablet Take 40 mg by mouth daily.    . digoxin 62.5 MCG TABS Take 0.0625 mg by mouth daily.    Marland Kitchen docusate sodium (COLACE)  100 MG capsule Take 1 capsule (100 mg total) by mouth 2 (two) times daily. 60 capsule 0  . dronabinol (MARINOL) 5 MG capsule Take 1 capsule (5 mg total) by mouth 2 (two) times daily before a meal. 60 capsule 0  . famotidine (PEPCID) 20 MG tablet Take 1 tablet (20 mg total) by mouth daily.    . folic acid (FOLVITE) 1 MG tablet Take 1 mg by mouth daily.    Marland Kitchen HYDROcodone-acetaminophen (NORCO/VICODIN) 5-325 MG tablet Take 1-2 tablets by mouth every 6 (six) hours as needed for moderate pain. 20 tablet 0  . insulin aspart (NOVOLOG) 100 UNIT/ML injection Inject 10 Units into the skin 3 (three) times daily with meals. (Patient taking differently: Inject 13 Units into the skin 3 (three) times daily with meals. ) 10 mL 11  . insulin aspart (NOVOLOG) 100 UNIT/ML injection Inject 0-15 Units into the skin 4 (four) times daily -  before meals and at bedtime. CBG < 70: implement hypoglycemia protocol CBG 70 - 120: 0 units CBG 121 - 150: 2 units CBG 151 - 200: 3 units CBG 201 - 250: 5 units CBG 251 - 300: 8 units CBG 301 - 350: 11 units CBG 351 - 400: 15 units CBG > 400: call MD and obtain STAT lab verification 10 mL 11  . insulin glargine (LANTUS) 100 UNIT/ML injection Inject 0.37 mLs (37 Units total) into the skin daily. 10 mL 11  .  lidocaine-prilocaine (EMLA) cream Apply to affected area once 30 g 3  . loratadine (CLARITIN) 10 MG tablet Take 1 tablet (10 mg total) by mouth daily.    . metoprolol tartrate (LOPRESSOR) 25 MG tablet Take 1 tablet (25 mg total) by mouth 2 (two) times daily.    . Multiple Vitamin (MULTIVITAMIN WITH MINERALS) TABS tablet Take 1 tablet by mouth daily.    . ondansetron (ZOFRAN) 8 MG tablet Take 1 tablet (8 mg total) by mouth 2 (two) times daily as needed for refractory nausea / vomiting. Start on day 3 after carboplatin chemo. 30 tablet 1  . oxybutynin (DITROPAN) 5 MG tablet Take 1 tablet (5 mg total) by mouth every 8 (eight) hours as needed for bladder spasms. 30 tablet 0  . polyethylene glycol (MIRALAX / GLYCOLAX) packet Take 17 g by mouth daily.    . prochlorperazine (COMPAZINE) 10 MG tablet Take 1 tablet (10 mg total) by mouth every 6 (six) hours as needed (Nausea or vomiting). 30 tablet 1  . protein supplement shake (PREMIER PROTEIN) LIQD Take 325 mLs (11 oz total) by mouth 2 (two) times daily between meals.  0  . Rivaroxaban (XARELTO) 15 MG TABS tablet Take 15 mg by mouth daily.    . rosuvastatin (CRESTOR) 5 MG tablet Take 5 mg by mouth daily.    Marland Kitchen senna-docusate (SENOKOT-S) 8.6-50 MG tablet Take 1 tablet by mouth daily.    . Simethicone (MYLANTA GAS PO) Take by mouth.    . tamsulosin (FLOMAX) 0.4 MG CAPS capsule Take 1 capsule (0.4 mg total) by mouth daily after supper. 30 capsule   . traZODone (DESYREL) 50 MG tablet Take 50 mg by mouth at bedtime.    . vitamin C (VITAMIN C) 250 MG tablet Take 1 tablet (250 mg total) by mouth 2 (two) times daily.     No current facility-administered medications for this visit.    Facility-Administered Medications Ordered in Other Visits  Medication Dose Route Frequency Provider Last Rate Last Dose  . heparin lock flush 100  unit/mL  500 Units Intravenous Once Earlie Server, MD         PHYSICAL EXAMINATION: ECOG PERFORMANCE STATUS: 3 - Symptomatic, >50%  confined to bed Vitals:   11/25/17 1303  BP: (!) 149/69  Pulse: 72  Temp: (!) 96.6 F (35.9 C)   Filed Weights   11/25/17 1303  Weight: 153 lb (69.4 kg)    Physical Exam  Constitutional: He is oriented to person, place, and time. No distress.  HENT:  Head: Normocephalic and atraumatic.  Mouth/Throat: Oropharynx is clear and moist. No oropharyngeal exudate.  Eyes: Pupils are equal, round, and reactive to light. EOM are normal. No scleral icterus.  Pale conjunctivae  Neck: Normal range of motion. Neck supple.  Cardiovascular: Normal rate, regular rhythm and normal heart sounds.  No murmur heard. Pulmonary/Chest: Effort normal and breath sounds normal. No respiratory distress. He has no wheezes.  Abdominal: Soft. Bowel sounds are normal. He exhibits no distension and no mass. There is no tenderness.  Genitourinary:  Genitourinary Comments: Bilateral percutaneous nephrostomy tubes with urine in the nephrostomy bags.  Musculoskeletal: Normal range of motion. He exhibits no edema or deformity.  Neurological: He is alert and oriented to person, place, and time. No cranial nerve deficit. Coordination normal.  Skin: Skin is warm and dry. No rash noted. No erythema.  Psychiatric: He has a normal mood and affect. His behavior is normal. Thought content normal.     LABORATORY DATA:  I have reviewed the data as listed Lab Results  Component Value Date   WBC 10.8 (H) 11/18/2017   HGB 9.2 (L) 11/18/2017   HCT 28.2 (L) 11/18/2017   MCV 86.0 11/18/2017   PLT 264 11/18/2017   Recent Labs    11/04/17 2007  11/11/17 1228 11/15/17 0838 11/18/17 0931  NA 127*   < > 136 136 135  K 4.6   < > 3.9 4.5 4.4  CL 98   < > 102 104 103  CO2 17*   < > 26 21* 21*  GLUCOSE 228*   < > 226* 285* 171*  BUN 70*   < > 23 33* 33*  CREATININE 7.89*   < > 2.21* 2.37* 2.16*  CALCIUM 8.2*   < > 8.6* 9.5 9.4  GFRNONAA 6*   < > 29* 27* 30*  GFRAA 7*   < > 34* 31* 35*  PROT 6.9  --   --  8.1 7.9    ALBUMIN 2.5*  --   --  3.0* 3.1*  AST 14*  --   --  32 28  ALT 12  --   --  23 24  ALKPHOS 99  --   --  104 115  BILITOT 0.5  --   --  0.4 0.4   < > = values in this interval not displayed.   Iron/TIBC/Ferritin/ %Sat    Component Value Date/Time   IRON 95 11/15/2017 0841   IRON 44 (L) 07/16/2011 1530   TIBC 262 11/15/2017 0841   TIBC 407 07/16/2011 1530   FERRITIN 226 11/15/2017 0841   FERRITIN 9 07/16/2011 1530   IRONPCTSAT 36 11/15/2017 0841   IRONPCTSAT 11 07/16/2011 1530     Repeat CT renal stone study was independently reviewed by me and discussed with patient. New findings of small non-well-characterized liver hypodensity lesion 5 mm and appears that he has local invasion of rectum, clinically T4 disease.   ASSESSMENT & PLAN:  1. Urothelial cancer ([place])   2. Chronic anticoagulation  3. Anemia, unspecified type   4. Encounter for antineoplastic chemotherapy   5. Acute kidney injury ([place])    #Locally advanced urothelial carcinoma, Counts are reviewed and discussed with patient and sister. Patient tolerated cycle 1 systemic chemotherapy with carboplatinum and gemcitabine last week.  Tolerates well. He has establish care with radiation oncology and is going to start radiation this week. Counts are reviewed and discussed with patient.  Acceptable to proceed with cycle 1 systemic chemotherapy treatment with carboplatin and gemcitabine. He will see radiation oncology and start simulation this week.  Once he is ready for concurrent chemoradiation, we will switch to concurrent chemotherapy regimen. Since he is going to start on radiation, will change chemotherapy regimen to weekly gemcitabine 100mg /m.  His overall poor performance status limits using other regimen  Such as 5-FU plus mitomycin C. Hopefully he is going to tolerate weekly gemcitabine along with radiation.  #Acute on chronic anemia, hemoglobin decreased to 7.8 and patient is feeling fatigued.  Anemia likely due  to neoplasm at chemotherapy.  Proceed with type and screen and transfuse 1 unit of PRBC tomorrow.  #Chronic A. fib, continue chronic anti-coagulation with Xarelto 15 mg daily.  On amiodarone and digoxin.  Close monitoring platelet counts #Thrombocytopenia, grade 1, likely secondary to chemotherapy.  Continue monitor.  No need for transfusion right now.  #Personal history of both colon cancer and urothelial cancer we have referred patient to genetic counselor and genetic testing pending. We also sent NGS panel results pending.  #Diarrhea, chronic, will check C. difficile.  If negative will start patient on Imodium as needed. Infusion center would not take patient for chemotherapy today as pending C. difficile status.  Hopefully able to collect specimen today and clarify his C. difficile status today Patient will get chemotherapy on Wednesday this week.  We spent sufficient time to discuss many aspect of care, questions were answered to patient's satisfaction. Return of visit: 1 week  Total face to face encounter time for this patient visit was  25 min. >50% of the time was  spent in counseling and coordination of care.    Earlie Server, MD, PhD Hematology Oncology [facility] Pager- [phone] 11/25/2017

## 2017-11-25 NOTE — Progress Notes (Signed)
DISCONTINUE ON PATHWAY REGIMEN - Bladder     A cycle is every 21 days:     Carboplatin      Gemcitabine   **Always confirm dose/schedule in your pharmacy ordering system**  REASON: Other Reason PRIOR TREATMENT: [id number]: Carboplatin AUC=5 D1 + Gemcitabine 1,000 mg/m2 D1, 8 q21 Days for a Maximum of 6 Cycles TREATMENT RESPONSE: Unable to Evaluate  START ON PATHWAY REGIMEN - Bladder   Gemcitabine 27 mg/m2 IV Twice Weekly + RT x 4 Weeks (Induction):   One induction cycle is 4 weeks:     Gemcitabine   **Always confirm dose/schedule in your pharmacy ordering system**  Gemcitabine 27 mg/m2 IV Twice Weekly + RT x 2.5 Weeks (Consolidation):   One consolidation cycle is 2.5 weeks:     Gemcitabine   **Always confirm dose/schedule in your pharmacy ordering system**  Patient Characteristics: Pre Cystectomy, Clinical T2-T4a, N0-1, M0, Cystectomy Ineligible/Patient Refuses Cystectomy AJCC M Category: M0 AJCC N Category: NX AJCC T Category: T4a Current evidence of distant metastases<= No AJCC 8 Stage Grouping: Unknown Intent of Therapy: Non-Curative / Palliative Intent, Discussed with Patient

## 2017-11-26 ENCOUNTER — Other Ambulatory Visit: Payer: Self-pay | Admitting: *Deleted

## 2017-11-26 ENCOUNTER — Inpatient Hospital Stay: Payer: Medicare Other

## 2017-11-26 ENCOUNTER — Ambulatory Visit
Admission: RE | Admit: 2017-11-26 | Discharge: 2017-11-26 | Disposition: A | Discharge status: Home / Self Care | Payer: Medicare Other | Source: Ambulatory Visit | Attending: Radiation Oncology | Admitting: Radiation Oncology

## 2017-11-26 VITALS — BP 125/75 | HR 63 | Temp 96.6°F | Resp 20

## 2017-11-26 DIAGNOSIS — D649 Anemia, unspecified: Secondary | ICD-10-CM

## 2017-11-26 DIAGNOSIS — C689 Malignant neoplasm of urinary organ, unspecified: Secondary | ICD-10-CM

## 2017-11-26 DIAGNOSIS — Z510 Encounter for antineoplastic radiation therapy: Secondary | ICD-10-CM | POA: Diagnosis not present

## 2017-11-26 DIAGNOSIS — Z5111 Encounter for antineoplastic chemotherapy: Secondary | ICD-10-CM | POA: Diagnosis not present

## 2017-11-26 LAB — PREPARE RBC (CROSSMATCH)

## 2017-11-26 MED ORDER — SODIUM CHLORIDE 0.9% IV SOLUTION
250.0000 mL | Freq: Once | INTRAVENOUS | Status: AC
  Administered 2017-11-26: 250 mL via INTRAVENOUS
  Filled 2017-11-26: qty 250

## 2017-11-26 MED ORDER — HEPARIN SOD (PORK) LOCK FLUSH 100 UNIT/ML IV SOLN
500.0000 [IU] | Freq: Every day | INTRAVENOUS | Status: AC | PRN
  Administered 2017-11-26: 500 [IU]
  Filled 2017-11-26 (×2): qty 5

## 2017-11-26 MED ORDER — ACETAMINOPHEN 325 MG PO TABS
650.0000 mg | ORAL_TABLET | Freq: Once | ORAL | Status: AC
  Administered 2017-11-26: 650 mg via ORAL
  Filled 2017-11-26: qty 2

## 2017-11-26 MED ORDER — SODIUM CHLORIDE 0.9 % IV SOLN
Freq: Once | INTRAVENOUS | Status: AC
  Administered 2017-11-26: 14:00:00 via INTRAVENOUS
  Filled 2017-11-26: qty 250

## 2017-11-26 MED ORDER — DIPHENHYDRAMINE HCL 25 MG PO CAPS
25.0000 mg | ORAL_CAPSULE | Freq: Once | ORAL | Status: AC
  Administered 2017-11-26: 25 mg via ORAL
  Filled 2017-11-26: qty 1

## 2017-11-26 MED ORDER — SODIUM CHLORIDE 0.9 % IV SOLN
100.0000 mg/m2 | Freq: Once | INTRAVENOUS | Status: AC
  Administered 2017-11-26: 190 mg via INTRAVENOUS
  Filled 2017-11-26: qty 5

## 2017-11-26 MED ORDER — PROCHLORPERAZINE MALEATE 10 MG PO TABS
10.0000 mg | ORAL_TABLET | Freq: Once | ORAL | Status: AC
  Administered 2017-11-26: 10 mg via ORAL
  Filled 2017-11-26: qty 1

## 2017-11-26 MED ORDER — SODIUM CHLORIDE 0.9% FLUSH
10.0000 mL | INTRAVENOUS | Status: AC | PRN
  Administered 2017-11-26: 10 mL
  Filled 2017-11-26: qty 10

## 2017-11-26 NOTE — Progress Notes (Signed)
Open in error

## 2017-11-26 NOTE — Progress Notes (Signed)
Gemzar 100mg /m2 dose ordered for bladder cancer and concurrent radiation. Spoke with MD and confirmed low dose gemzar. Will proceed with therapy

## 2017-11-27 ENCOUNTER — Ambulatory Visit
Admission: RE | Admit: 2017-11-27 | Discharge: 2017-11-27 | Disposition: A | Discharge status: Home / Self Care | Payer: Medicare Other | Source: Ambulatory Visit | Attending: Radiation Oncology | Admitting: Radiation Oncology

## 2017-11-27 DIAGNOSIS — Z510 Encounter for antineoplastic radiation therapy: Secondary | ICD-10-CM | POA: Diagnosis not present

## 2017-11-27 LAB — TYPE AND SCREEN
ABO/RH(D): A NEG
Antibody Screen: NEGATIVE
UNIT DIVISION: 0

## 2017-11-27 LAB — BPAM RBC
Blood Product Expiration Date: 201911132359
ISSUE DATE / TIME: 201910221215
Unit Type and Rh: 600

## 2017-11-28 ENCOUNTER — Ambulatory Visit
Admission: RE | Admit: 2017-11-28 | Discharge: 2017-11-28 | Disposition: A | Discharge status: Home / Self Care | Payer: Medicare Other | Source: Ambulatory Visit | Attending: Radiation Oncology | Admitting: Radiation Oncology

## 2017-11-28 ENCOUNTER — Inpatient Hospital Stay: Payer: Medicare Other

## 2017-11-28 DIAGNOSIS — Z510 Encounter for antineoplastic radiation therapy: Secondary | ICD-10-CM | POA: Diagnosis not present

## 2017-11-29 ENCOUNTER — Ambulatory Visit
Admission: RE | Admit: 2017-11-29 | Discharge: 2017-11-29 | Disposition: A | Discharge status: Home / Self Care | Payer: Medicare Other | Source: Ambulatory Visit | Attending: Radiation Oncology | Admitting: Radiation Oncology

## 2017-11-29 DIAGNOSIS — Z510 Encounter for antineoplastic radiation therapy: Secondary | ICD-10-CM | POA: Diagnosis not present

## 2017-12-02 ENCOUNTER — Ambulatory Visit
Admission: RE | Admit: 2017-12-02 | Discharge: 2017-12-02 | Disposition: A | Discharge status: Home / Self Care | Payer: Medicare Other | Source: Ambulatory Visit | Attending: Radiation Oncology | Admitting: Radiation Oncology

## 2017-12-02 ENCOUNTER — Other Ambulatory Visit: Payer: Self-pay

## 2017-12-02 ENCOUNTER — Inpatient Hospital Stay: Payer: Medicare Other

## 2017-12-02 DIAGNOSIS — Z510 Encounter for antineoplastic radiation therapy: Secondary | ICD-10-CM | POA: Diagnosis not present

## 2017-12-02 DIAGNOSIS — C689 Malignant neoplasm of urinary organ, unspecified: Secondary | ICD-10-CM

## 2017-12-02 NOTE — Progress Notes (Signed)
Nutrition  Patient did not stay for nutrition follow-up appointment after radiation today.  Will plan to meet with patient on 10/31 after radiation.  Jalani Rominger B. Zenia Resides, [place], [place] Registered Dietitian [phone] (pager)

## 2017-12-03 ENCOUNTER — Inpatient Hospital Stay: Payer: Medicare Other

## 2017-12-03 ENCOUNTER — Other Ambulatory Visit: Payer: Self-pay

## 2017-12-03 ENCOUNTER — Ambulatory Visit
Admission: RE | Admit: 2017-12-03 | Discharge: 2017-12-03 | Disposition: A | Discharge status: Home / Self Care | Payer: Medicare Other | Source: Ambulatory Visit | Attending: Radiation Oncology | Admitting: Radiation Oncology

## 2017-12-03 ENCOUNTER — Telehealth: Payer: Self-pay | Admitting: Genetic Counselor

## 2017-12-03 ENCOUNTER — Inpatient Hospital Stay (HOSPITAL_BASED_OUTPATIENT_CLINIC_OR_DEPARTMENT_OTHER): Payer: Medicare Other | Admitting: Oncology

## 2017-12-03 ENCOUNTER — Encounter: Payer: Self-pay | Admitting: Oncology

## 2017-12-03 VITALS — BP 150/72 | HR 77 | Temp 96.9°F | Wt 152.1 lb

## 2017-12-03 DIAGNOSIS — Z79899 Other long term (current) drug therapy: Secondary | ICD-10-CM

## 2017-12-03 DIAGNOSIS — Z992 Dependence on renal dialysis: Secondary | ICD-10-CM | POA: Diagnosis not present

## 2017-12-03 DIAGNOSIS — C689 Malignant neoplasm of urinary organ, unspecified: Secondary | ICD-10-CM

## 2017-12-03 DIAGNOSIS — R197 Diarrhea, unspecified: Secondary | ICD-10-CM

## 2017-12-03 DIAGNOSIS — D649 Anemia, unspecified: Secondary | ICD-10-CM

## 2017-12-03 DIAGNOSIS — Z5111 Encounter for antineoplastic chemotherapy: Secondary | ICD-10-CM | POA: Diagnosis not present

## 2017-12-03 DIAGNOSIS — C679 Malignant neoplasm of bladder, unspecified: Secondary | ICD-10-CM | POA: Diagnosis not present

## 2017-12-03 DIAGNOSIS — Z936 Other artificial openings of urinary tract status: Secondary | ICD-10-CM | POA: Diagnosis not present

## 2017-12-03 DIAGNOSIS — Z510 Encounter for antineoplastic radiation therapy: Secondary | ICD-10-CM | POA: Diagnosis not present

## 2017-12-03 DIAGNOSIS — Z7901 Long term (current) use of anticoagulants: Secondary | ICD-10-CM

## 2017-12-03 DIAGNOSIS — I480 Paroxysmal atrial fibrillation: Secondary | ICD-10-CM

## 2017-12-03 LAB — COMPREHENSIVE METABOLIC PANEL
ALT: 17 U/L (ref 0–44)
AST: 22 U/L (ref 15–41)
Albumin: 3 g/dL — ABNORMAL LOW (ref 3.5–5.0)
Alkaline Phosphatase: 108 U/L (ref 38–126)
Anion gap: 7 (ref 5–15)
BILIRUBIN TOTAL: 0.5 mg/dL (ref 0.3–1.2)
BUN: 26 mg/dL — AB (ref 8–23)
CO2: 23 mmol/L (ref 22–32)
Calcium: 8.9 mg/dL (ref 8.9–10.3)
Chloride: 103 mmol/L (ref 98–111)
Creatinine, Ser: 1.7 mg/dL — ABNORMAL HIGH (ref 0.61–1.24)
GFR calc Af Amer: 46 mL/min — ABNORMAL LOW (ref >60)
GFR, EST NON AFRICAN AMERICAN: 40 mL/min — AB (ref >60)
Glucose, Bld: 278 mg/dL — ABNORMAL HIGH (ref 70–99)
Potassium: 4.6 mmol/L (ref 3.5–5.1)
Sodium: 133 mmol/L — ABNORMAL LOW (ref 135–145)
TOTAL PROTEIN: 7.5 g/dL (ref 6.5–8.1)

## 2017-12-03 LAB — CBC WITH DIFFERENTIAL/PLATELET
ABS IMMATURE GRANULOCYTES: 0.04 10*3/uL (ref 0.00–0.07)
BASOS PCT: 0 %
Basophils Absolute: 0 10*3/uL (ref 0.0–0.1)
EOS ABS: 0 10*3/uL (ref 0.0–0.5)
EOS PCT: 0 %
HCT: 26.5 % — ABNORMAL LOW (ref 39.0–52.0)
Hemoglobin: 8.5 g/dL — ABNORMAL LOW (ref 13.0–17.0)
Immature Granulocytes: 2 %
Lymphocytes Relative: 24 %
Lymphs Abs: 0.6 10*3/uL — ABNORMAL LOW (ref 0.7–4.0)
MCH: 27.9 pg (ref 26.0–34.0)
MCHC: 32.1 g/dL (ref 30.0–36.0)
MCV: 86.9 fL (ref 80.0–100.0)
MONO ABS: 0.2 10*3/uL (ref 0.1–1.0)
Monocytes Relative: 9 %
NEUTROS ABS: 1.7 10*3/uL (ref 1.7–7.7)
Neutrophils Relative %: 65 %
PLATELETS: 163 10*3/uL (ref 150–400)
RBC: 3.05 MIL/uL — AB (ref 4.22–5.81)
RDW: 13.5 % (ref 11.5–15.5)
WBC: 2.6 10*3/uL — AB (ref 4.0–10.5)
nRBC: 0 % (ref 0.0–0.2)

## 2017-12-03 LAB — TYPE AND SCREEN
ABO/RH(D): A NEG
Antibody Screen: NEGATIVE

## 2017-12-03 MED ORDER — SODIUM CHLORIDE 0.9 % IV SOLN
100.0000 mg/m2 | Freq: Once | INTRAVENOUS | Status: AC
  Administered 2017-12-03: 190 mg via INTRAVENOUS
  Filled 2017-12-03: qty 5

## 2017-12-03 MED ORDER — HEPARIN SOD (PORK) LOCK FLUSH 100 UNIT/ML IV SOLN
500.0000 [IU] | Freq: Once | INTRAVENOUS | Status: AC | PRN
  Administered 2017-12-03: 500 [IU]
  Filled 2017-12-03: qty 5

## 2017-12-03 MED ORDER — PROCHLORPERAZINE MALEATE 10 MG PO TABS
10.0000 mg | ORAL_TABLET | Freq: Once | ORAL | Status: AC
  Administered 2017-12-03: 10 mg via ORAL
  Filled 2017-12-03: qty 1

## 2017-12-03 MED ORDER — SODIUM CHLORIDE 0.9 % IV SOLN
Freq: Once | INTRAVENOUS | Status: AC
  Administered 2017-12-03: 12:00:00 via INTRAVENOUS
  Filled 2017-12-03: qty 250

## 2017-12-03 NOTE — Progress Notes (Signed)
Sodium 133, Creatinine 1.7, WBC 2.6. Per Almyra Free CMA per Dr. Tasia Catchings, okay to proceed with treatment.

## 2017-12-03 NOTE — Progress Notes (Signed)
Hematology/Oncology Follow up note [facility] Telephone:(336) [phone] Fax:(336) [phone]   Patient Care Team: Albina Billet, MD as PCP - General (Internal Medicine)  REFERRING PROVIDER: Dr.Brandon REASON FOR VISIT Follow up for treatment of  bladder cancer  HISTORY OF PRESENTING ILLNESS:  Jordan [name] is a  67 y.o.  male with PMH listed below who was referred to me for evaluation of newly discovered bladder cancer.  He was recently admitted from 8/17-8/27 with bilateral hydronephrosis and acute renal failure requiring dialysis due to bilateral ureteral obstruction. Bilateral nephrostomy tubes were placed while he was inpatient. He followed up in the urology office and had a cystoscopy that was concerning for bladder. He underwent TURBT and bilateral ureteral stent placement on 10/23/17. Surgical pathology showed high grade carcinoma Image was independently revived by me.  09/22/2017 CT abdomen pelvis wo  Bilateral symmetric mild hydroureteronephrosis. Foley catheter present within a collapsed bladder as there is increased density material within the bladder possibly hemorrhagic debris. There is also increased density material over the left pelvis with some crossing midline adjacent both distal ureters left worse than right likely hemorrhagic debris. These factors may be responsible for the Hydroureteronephrosis.  Small bilateral pleural effusions with associated bibasilar atelectasis. 1.4 cm right renal cyst. Previous partial right colectomy.Left femoral venous catheter with tip over the external iliac vein near the junction to common iliac vein. 10/30/2017 CT chest wo contrast  1. No evidence of metastatic disease. 2. Bilateral hydronephrosis and double-J ureteral stents, partially imaged. 3. Coronary artery calcification  10/23/2017 Cystoscopy showed  diffuse nodular, edematous, and areas of papillary tumor involving the majority of the surface of the bladder, at least  70% of the surface area greater than 5 cm.  The trigone itself is also distorted and nodular. On both sides, there was a high-grade distal ureteral obstruction with a very small caliber ureter which started at the level of the iliacs and extended all the way down to level of the bladder.  This is highly concerning for extrinsic compression of the ureter.s/p TUBRT, ureteral stent placed.   # Type 1 Diabetes,poorly controlled, recently admitted due to DKA. Chronic Paroxysmal A fib, on reduced dose of Xarelto 15mg  daily. .  Bilateral hydronephrosis s/p percutaneous drainage tubes placed, TURBT and ureteral stenting.  Case was discussed on tumor board on 10/31/2017. Although patient did not have obvious metastatic disease, he does likely have locally advanced disease likely extending of the pelvic wall bilaterally which compresses his ureters. On both sides, there was a high-grade distal ureteral obstruction with a very small caliber ureter which started at the level of the iliacs and extended all the way down to level of the bladder.  This is highly concerning for extrinsic compression of the ureter.  #Chronic A. 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 Stage II  # He was admitted to the hospital due to acute on chronic kidney failure, found to be post obstruction due to the failure of ureteral stent.  Nephrostomy tubes were uncapped and the patient was aggressively hydrated.  Creatinine improved.  Patient was discharged to rehab facility He also appears to have UTI/cystitis and urine culture growing VRE and E. coli.  He was treated with IV antibiotics and is switched to oral antibiotics Keflex and Zyvox. Due to the admission, patient did not meet radiation oncology and has not been simulated yet. There was a CT renal stone study done 11/04/2017 during  this admission.  CT showed bilateral percutaneous nephrostomy and bilateral ureter stent appear in good  position.  Degree of hydronephrosis appears similar.  There is actually thickness of the rectum and soft tissue along the left aspect of the rectum likely represent tumor.  Small bilateral effusion. There is also 5 mm low-attenuation lesion in the right hepatic lobe not further characterized.  This appears to be a new finding during the interval.  INTERVAL HISTORY Jordan [name] is a 67 y.o. male who has above history reviewed by me today presents for follow-up for assessment prior to chemotherapy for locally advanced bladder cancer. Patient is status post 1 cycle of systemic carbo and gemcitabine treatment. Followed by weekly gemcitabine concurrent chemoradiation. He received 1 unit of PRBC for symptomatic anemia last week. Reports that fatigue level has significantly improved after the blood transfusion. Appetite is fair.  Weight has been stable for the past 2 weeks. #Chemotherapy, tolerates chemotherapy well.  No nausea vomiting no fever or chills. Denies any additional episodes of diarrhea. Currently in rehab facility.  Nephrostomy bags draining well.  No pain.  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 Genetic counselor Ofri has called patient and discussed about results.      Review of Systems  Constitutional: Positive for malaise/fatigue. Negative for chills and fever.  HENT: Negative for nosebleeds and sore throat.   Eyes: Negative for double vision, photophobia and redness.  Respiratory: Negative for cough, shortness of breath and wheezing.   Cardiovascular: Negative for chest pain, palpitations, orthopnea and leg swelling.  Gastrointestinal: Negative for abdominal pain, blood in stool, diarrhea, melena, nausea and vomiting.  Genitourinary: Negative for dysuria.       Nephrostomy bags draining well.  Musculoskeletal: Negative for back pain, myalgias and neck pain.  Skin:  Negative for itching and rash.  Neurological: Negative for dizziness, tingling and tremors.  Endo/Heme/Allergies: Negative for environmental allergies. Does not bruise/bleed easily.  Psychiatric/Behavioral: Negative for depression and hallucinations.    MEDICAL HISTORY:  Past Medical History:  Diagnosis Date  . Atrial fibrillation ([place])   . Cancer ([place])    a. 06/2011 b. s/p right hemicolectomy/ colon CA  . Colon polyps   . Dysrhythmia   . Family history of cancer   . Hypertension   . Hyperthyroidism    a. 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 Nursing home resident   . Stroke ([place]) 03/2012   a. residual right sided weakness  . Type 1 diabetes ([place])    a. on insulin b. prior DKA     SURGICAL HISTORY: Past Surgical History:  Procedure Laterality Date  . CARDIOVERSION N/A 03/25/2014   Procedure: CARDIOVERSION;  Surgeon: Lelon Perla, MD;  Location: [facility] ENDOSCOPY;  Service: Cardiovascular;  Laterality: N/A;  . COLONOSCOPY    . COLONOSCOPY WITH PROPOFOL N/A 07/24/2017   Procedure: COLONOSCOPY WITH PROPOFOL;  Surgeon: Toledo, Benay Pike, MD;  Location: [facility] ENDOSCOPY;  Service: Gastroenterology;  Laterality: N/A;  . CYSTOSCOPY W/ RETROGRADES Bilateral 10/23/2017   Procedure: CYSTOSCOPY WITH RETROGRADE PYELOGRAM;  Surgeon: Hollice Espy, MD;  Location: [facility] ORS;  Service: Urology;  Laterality: Bilateral;  . CYSTOSCOPY WITH RETROGRADE URETHROGRAM Bilateral 10/23/2017   Procedure: CYSTOSCOPY WITH antegrade nephrostogram;  Surgeon: Hollice Espy, MD;  Location: [facility] ORS;  Service: Urology;  Laterality: Bilateral;  . CYSTOSCOPY WITH STENT PLACEMENT Bilateral 10/23/2017   Procedure: CYSTOSCOPY WITH STENT PLACEMENT;  Surgeon: Hollice Espy, MD;  Location: [facility] ORS;  Service: Urology;  Laterality: Bilateral;  . HEMICOLECTOMY Right   . IR NEPHROSTOMY EXCHANGE LEFT  11/08/2017  . IR NEPHROSTOMY EXCHANGE RIGHT  11/08/2017  . IR NEPHROSTOMY PLACEMENT LEFT  09/22/2017  .  IR NEPHROSTOMY PLACEMENT RIGHT  09/22/2017  . PORTA CATH INSERTION N/A 11/07/2017   Procedure: PORTA CATH INSERTION;  Surgeon: Algernon Huxley, MD;  Location: [place] CV LAB;  Service: Cardiovascular;  Laterality: N/A;  . TRANSURETHRAL RESECTION OF BLADDER TUMOR N/A 10/23/2017   Procedure: TRANSURETHRAL RESECTION OF BLADDER TUMOR (TURBT);  Surgeon: Hollice Espy, MD;  Location: [facility] ORS;  Service: Urology;  Laterality: N/A;    SOCIAL HISTORY: Social History   Socioeconomic History  . Marital status: Single    Spouse name: Not on file  . Number of children: 0  . Years of education: Not on file  . Highest education level: Not on file  Occupational History  . Not on file  Social Needs  . Financial resource strain: Not hard at all  . Food insecurity:    Worry: Never true    Inability: Never true  . Transportation needs:    Medical: Yes    Non-medical: Yes  Tobacco Use  . Smoking status: Former Smoker    Years: 10.00    Types: Cigarettes    Last attempt to quit: 09/21/1973    Years since quitting: 44.2  . Smokeless tobacco: Never Used  Substance and Sexual Activity  . Alcohol use: No  . Drug use: No  . Sexual activity: Not Currently  Lifestyle  . Physical activity:    Days per week: Patient refused    Minutes per session: Patient refused  . Stress: Not on file  Relationships  . Social connections:    Talks on phone: Once a week    Gets together: Once a week    Attends religious service: 1 to 4 times per year    Active member of club or organization: No    Attends meetings of clubs or organizations: Never    Relationship status: Never married  . Intimate partner violence:    Fear of current or ex partner: Patient refused    Emotionally abused: Patient refused    Physically abused: Patient refused    Forced sexual activity: Patient refused  Other Topics Concern  . Not on file  Social History Narrative  . Not on file    FAMILY HISTORY: Family History  Problem  Relation Age of Onset  . Heart attack Mother        deceased 57  . Diabetes Mother   . Hypertension Mother   . Heart attack Father        deceased 1  . Colon cancer Brother 40       currently 83  . Lung cancer Brother        smoker; deceased 66  . Cancer Maternal Aunt        unk. primary; deceased 51    ALLERGIES:  is allergic to amiodarone.  MEDICATIONS:  Current Outpatient Medications  Medication Sig Dispense Refill  . acetaminophen (TYLENOL) 325 MG tablet Take 2 tablets (650 mg total) by mouth every 6 (six) hours as needed for mild pain (or Fever >/= 101).    Marland Kitchen amiodarone (PACERONE) 200 MG tablet Take 200 mg by mouth daily.  0  . amLODipine (NORVASC) 10 MG tablet Take 10 mg by mouth daily.    Marland Kitchen atorvastatin (LIPITOR)  40 MG tablet Take 40 mg by mouth daily.    . digoxin 62.5 MCG TABS Take 0.0625 mg by mouth daily.    Marland Kitchen docusate sodium (COLACE) 100 MG capsule Take 1 capsule (100 mg total) by mouth 2 (two) times daily. 60 capsule 0  . dronabinol (MARINOL) 5 MG capsule Take 1 capsule (5 mg total) by mouth 2 (two) times daily before a meal. 60 capsule 0  . famotidine (PEPCID) 20 MG tablet Take 1 tablet (20 mg total) by mouth daily.    . folic acid (FOLVITE) 1 MG tablet Take 1 mg by mouth daily.    Marland Kitchen HYDROcodone-acetaminophen (NORCO/VICODIN) 5-325 MG tablet Take 1-2 tablets by mouth every 6 (six) hours as needed for moderate pain. 20 tablet 0  . insulin aspart (NOVOLOG) 100 UNIT/ML injection Inject 10 Units into the skin 3 (three) times daily with meals. (Patient taking differently: Inject 13 Units into the skin 3 (three) times daily with meals. ) 10 mL 11  . insulin aspart (NOVOLOG) 100 UNIT/ML injection Inject 0-15 Units into the skin 4 (four) times daily -  before meals and at bedtime. CBG < 70: implement hypoglycemia protocol CBG 70 - 120: 0 units CBG 121 - 150: 2 units CBG 151 - 200: 3 units CBG 201 - 250: 5 units CBG 251 - 300: 8 units CBG 301 - 350: 11 units CBG 351 - 400:  15 units CBG > 400: call MD and obtain STAT lab verification 10 mL 11  . insulin glargine (LANTUS) 100 UNIT/ML injection Inject 0.37 mLs (37 Units total) into the skin daily. 10 mL 11  . lidocaine-prilocaine (EMLA) cream Apply to affected area once 30 g 3  . loratadine (CLARITIN) 10 MG tablet Take 1 tablet (10 mg total) by mouth daily.    . metoprolol tartrate (LOPRESSOR) 25 MG tablet Take 1 tablet (25 mg total) by mouth 2 (two) times daily.    . Multiple Vitamin (MULTIVITAMIN WITH MINERALS) TABS tablet Take 1 tablet by mouth daily.    . ondansetron (ZOFRAN) 8 MG tablet Take 1 tablet (8 mg total) by mouth 2 (two) times daily as needed for refractory nausea / vomiting. Start on day 3 after carboplatin chemo. 30 tablet 1  . oxybutynin (DITROPAN) 5 MG tablet Take 1 tablet (5 mg total) by mouth every 8 (eight) hours as needed for bladder spasms. 30 tablet 0  . polyethylene glycol (MIRALAX / GLYCOLAX) packet Take 17 g by mouth daily.    . prochlorperazine (COMPAZINE) 10 MG tablet Take 1 tablet (10 mg total) by mouth every 6 (six) hours as needed (Nausea or vomiting). 30 tablet 1  . protein supplement shake (PREMIER PROTEIN) LIQD Take 325 mLs (11 oz total) by mouth 2 (two) times daily between meals.  0  . Rivaroxaban (XARELTO) 15 MG TABS tablet Take 15 mg by mouth daily.    . rosuvastatin (CRESTOR) 5 MG tablet Take 5 mg by mouth daily.    Marland Kitchen senna-docusate (SENOKOT-S) 8.6-50 MG tablet Take 1 tablet by mouth daily.    . Simethicone (MYLANTA GAS PO) Take by mouth.    . tamsulosin (FLOMAX) 0.4 MG CAPS capsule Take 1 capsule (0.4 mg total) by mouth daily after supper. 30 capsule   . traZODone (DESYREL) 50 MG tablet Take 50 mg by mouth at bedtime.    . vitamin C (VITAMIN C) 250 MG tablet Take 1 tablet (250 mg total) by mouth 2 (two) times daily.     No current  facility-administered medications for this visit.    Facility-Administered Medications Ordered in Other Visits  Medication Dose Route Frequency  Provider Last Rate Last Dose  . heparin lock flush 100 unit/mL  500 Units Intravenous Once Earlie Server, MD         PHYSICAL EXAMINATION: ECOG PERFORMANCE STATUS: 3 - Symptomatic, >50% confined to bed Vitals:   12/03/17 1118  BP: (!) 150/72  Pulse: 77  Temp: (!) 96.9 F (36.1 C)   Filed Weights   12/03/17 1118  Weight: 152 lb 2 oz (69 kg)    Physical Exam  Constitutional: He is oriented to person, place, and time. No distress.  Chronic ill appearance  HENT:  Head: Normocephalic and atraumatic.  Mouth/Throat: Oropharynx is clear and moist. No oropharyngeal exudate.  Eyes: Pupils are equal, round, and reactive to light. EOM are normal. No scleral icterus.  Pale conjunctivae  Neck: Normal range of motion. Neck supple.  Cardiovascular:  No murmur heard. Pulmonary/Chest: Effort normal and breath sounds normal. No respiratory distress. He has no wheezes.  Abdominal: Soft. Bowel sounds are normal. He exhibits no distension and no mass. There is no tenderness.  Genitourinary:  Genitourinary Comments: Bilateral percutaneous nephrostomy tubes with urine in the nephrostomy bags.  Musculoskeletal: Normal range of motion. He exhibits no edema or deformity.  Neurological: He is alert and oriented to person, place, and time. No cranial nerve deficit. Coordination normal.  Skin: Skin is warm and dry. No rash noted. No erythema.  Psychiatric: He has a normal mood and affect. His behavior is normal. Thought content normal.     LABORATORY DATA:  I have reviewed the data as listed Lab Results  Component Value Date   WBC 2.6 (L) 12/03/2017   HGB 8.5 (L) 12/03/2017   HCT 26.5 (L) 12/03/2017   MCV 86.9 12/03/2017   PLT 163 12/03/2017   Recent Labs    11/18/17 0931 11/25/17 0831 12/03/17 1040  NA 135 133* 133*  K 4.4 4.3 4.6  CL 103 101 103  CO2 21* 24 23  GLUCOSE 171* 235* 278*  BUN 33* 24* 26*  CREATININE 2.16* 1.64* 1.70*  CALCIUM 9.4 8.7* 8.9  GFRNONAA 30* 42* 40*  GFRAA 35*  48* 46*  PROT 7.9 7.5 7.5  ALBUMIN 3.1* 3.0* 3.0*  AST 28 19 22   ALT 24 18 17   ALKPHOS 115 105 108  BILITOT 0.4 0.5 0.5   Iron/TIBC/Ferritin/ %Sat    Component Value Date/Time   IRON 95 11/15/2017 0841   IRON 44 (L) 07/16/2011 1530   TIBC 262 11/15/2017 0841   TIBC 407 07/16/2011 1530   FERRITIN 226 11/15/2017 0841   FERRITIN 9 07/16/2011 1530   IRONPCTSAT 36 11/15/2017 0841   IRONPCTSAT 11 07/16/2011 1530     Repeat CT renal stone study was independently reviewed by me and discussed with patient. New findings of small non-well-characterized liver hypodensity lesion 5 mm and appears that he has local invasion of rectum, clinically T4 disease.   ASSESSMENT & PLAN:  1. Urothelial cancer ([place])   2. Symptomatic anemia   3. Chronic anticoagulation   4. Encounter for antineoplastic chemotherapy   5. Nephrostomy status ([place])    #Locally advanced urothelial carcinoma, Consult reviewed and discussed with patient and his sister. Patient tolerates concurrent chemo and radiation. Counts are acceptable to proceed with today's weekly gemcitabine.  He is radiation treatments.  #Symptomatic anemia, hemoglobin improved after 1 unit of PRBC transfusion last week.  Hemoglobin 8.5 today. Continue to  monitor.  #Chronic A. fib, on Xarelto 15 mg daily.  Also on amiodarone and digoxin.  Close monitoring platelet counts. #Thrombocytopenia, resolved. #Personal history of both colon cancer and urothelial cancer.  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 Genetic counselor Ofri has called patient and discussed about results.   we also have sent NGS, awaiting results. We spent sufficient time to discuss many aspect of care, questions were answered to patient's satisfaction.  Return of visit: 1 week Total face to face encounter time for this patient visit was 30min. >50% of the time was  spent in counseling and coordination of care.     Earlie Server, MD, PhD Hematology  Oncology [facility] Pager- [phone] 12/03/2017

## 2017-12-03 NOTE — Telephone Encounter (Signed)
Cancer Genetics                  Telegenetics Initial Visit    Patient Name: Jordan [name] Patient DOB: 04-Apr-1950 Patient Age: 67 y.o. Encounter Date: 12/03/2017  Referring Provider: Earlie Server, MD  Reason for Call: Discuss results of genetic testing Two mutations in MUTYH (phase unknown)   Mr. Dingley was called today to discuss genetic test results. The information below was discussed with his sister, Edwena Blow, per his request and a copy of the results as well as a letter will be sent to her with additional explanation. Please see the Genetics telephone note from 11/05/2017 for a detailed discussion of his personal and family histories and the recommendations provided.  Genetic Testing: At the time of Mr. Hanser's telegenetics visit, he and his sister who takes him to medical appointments, decided for him to pursue genetic testing of multiple genes associated with hereditary susceptibility to cancer. Testing included sequencing and deletion/duplication analysis. Testing revealed two pathogenic mutations in the MUTYH gene called c.1187G>A (p.Gly396Asp) and c.733C>T (p.Arg245Cys).   A copy of the genetic test report will be scanned into Epic under the Media tab.  Genes Analyzed: The genes on the panel were the 84 genes on [facility]'s Multi-Cancer panel (AIP, ALK, APC, ATM, AXIN2, BAP1, BARD1, BLM, BMPR1A, BRCA1, BRCA2, BRIP1, CASR, CDC73, CDH1, CDK4, CDKN1B, CDKN1C, CDKN2A, CEBPA, CHEK2, CTNNA1, DICER1, DIS3L2, EGFR, EPCAM, FH, FLCN, GATA2, GPC3, GREM1, HOXB13, HRAS, KIT, MAX, MEN1, MET, MITF, MLH1, MSH2, MSH3, MSH6, MUTYH, NBN, NF1, NF2, NTHL1, PALB2, PDGFRA, PHOX2B, PMS2, POLD1, POLE, POT1, PRKAR1A, PTCH1, PTEN, RAD50, RAD51C, RAD51D, RB1, RECQL4, RET, RUNX1, SDHA, SDHAF2, SDHB, SDHC, SDHD, SMAD4, SMARCA4, SMARCB1, SMARCE1, STK11, SUFU, TERC, TERT, TMEM127, TP53, TSC1, TSC2, VHL, WRN, WT1).  Interpretation: The risk associated with this genetic finding depends on  whether the two MUTYH mutations are both in the same copy (alelle) of the MUTYH gene or a mutation is present in each copy of the MUTYH gene.   If an individual has two mutations in the same copy of the MUTYH gene, their chance of developing colorectal cancer may be greater than the general population risk. About 1-2% of individuals of [place] descent are carriers of a single MUTYH mutation. There is conflicting data regarding whether a single MUTYH mutation (or 2 MUTYH mutations in the same allele) confers a moderate increased risk (up to 2-fold) for colorectal cancer.  If an individual has a mutation in each copy of the MUTYH gene (biallelic mutations), they have MUTYH-associated polyposis (MAP). This is a hereditary disorder that is associated with polyposis (a large number of polyps in the gastrointestinal tract) as well as an increased risk of polyposis, colorecta and small bowel cancers, among other cancers.   While Mr. Gorka has a history of colon cancer, he does not seem to have polyposis. Review of his colonoscopies available in the system indicated about 7 total adenomatous polyps.  Medical Management: Recommendations will be deferred to his providers as he is being treated for metastatic cancer.  Just for information, the National Comprehensive Cancer Network Genetic/Familial High Risk Assessment: Colorectal (V2.2019) recommends the following:  Single MUTYH mutation or 2 MUTYH mutations in the same allele Colorectal cancer: - Beginning at age 67 or 85 years younger than the earliest diagnosis of colorectal cancer in a parent, sibling, or child (whichever is earlier): Colonoscopy every 5 years. If there is no  family history of colorectal cancer, data are uncertain if specialized screening is warranted.  - These recommendations may change if an individual has polyps, colorectal cancer, inflammatory bowel disease (IBD), or family history of colorectal cancer.  Biallelic MUTYH  mutations (MAP) Colorectal cancer: Starting at age 38-30: Colonoscopy every 1-2 years if no polyps are found. Depending on age and number of polyps: Evaluation for colectomy (surgical removal of the colon and/or rectum).   Small bowel cancer (duodenal and other sections): Starting at age 71-35 years: Baseline upper endoscopy (including complete visualization of the ampulla of Vater). Frequency of the endoscopy depends on the number and size of polyps identified.   Other MUTYH-Associated Polyposis (MAP)-related conditions: Physical exam annually.   Family Members: It is important that Mr. Kimrey informs his relatives of these results. They are recommended to speak with a genetic counselor prior to any testing. A genetic counselor can be located at [email or web address]. Mr. Kissick sister was given the contact information for the genetic counseling clinic at [facility] in [place] to share with the rest of their siblings.  We encouraged Mr. Odenthal to remain in contact with [place] on an annual basis so we can update his personal and family histories, and let him know of advances in cancer genetics that may benefit the family. Our contact number was provided. Mr. Diskin questions were answered to his satisfaction today, and he knows he is welcome to call anytime with additional questions.    Steele Berg, MS, [place] Certified Genetic Counselor phone: [phone] [email or web address]

## 2017-12-04 ENCOUNTER — Ambulatory Visit
Admission: RE | Admit: 2017-12-04 | Discharge: 2017-12-04 | Disposition: A | Discharge status: Home / Self Care | Payer: Medicare Other | Source: Ambulatory Visit | Attending: Radiation Oncology | Admitting: Radiation Oncology

## 2017-12-04 DIAGNOSIS — Z510 Encounter for antineoplastic radiation therapy: Secondary | ICD-10-CM | POA: Diagnosis not present

## 2017-12-05 ENCOUNTER — Inpatient Hospital Stay: Payer: Medicare Other

## 2017-12-05 ENCOUNTER — Ambulatory Visit
Admission: RE | Admit: 2017-12-05 | Discharge: 2017-12-05 | Disposition: A | Discharge status: Home / Self Care | Payer: Medicare Other | Source: Ambulatory Visit | Attending: Radiation Oncology | Admitting: Radiation Oncology

## 2017-12-05 DIAGNOSIS — Z510 Encounter for antineoplastic radiation therapy: Secondary | ICD-10-CM | POA: Diagnosis not present

## 2017-12-05 NOTE — Progress Notes (Signed)
Nutrition Follow-up:  Patient with urothelial cancer.  Patient living a [facility].  Currently receiving chemotherapy and radiation therapy.   Met with patient and sister today after radiation.  Patient reports that his appetite is slightly better.  Sister agrees.  Reports that he ate most of eggs this am and ham.  Sister reports she took him hamburger yesterday and he ate 100% of plus few fries.  Still reports gets full quickly.  Does have loose stool but this is normal for him. Patient thinks that he is drinking the nutrition shake that comes on tray.  Not sure that he gets it on every trays. Sister has only seen it on lunch tray.      Medications: marinol added and reports taking it  Labs: reviewed  Anthropometrics:   Weight stable at 153 lb slight decrease from 154 lb on 10/14.  UBW in 180s, 177 lb in 6/19  NUTRITION DIAGNOSIS: Inadequate oral intake improving   INTERVENTION:  Encouraged patient to continue to drink nutrition shake on trays. Sister reports has boost shakes in room as well that he can drink. Discussed adding a snack around 10am as sister reports goes long time between breakfast and lunch.  She will follow-up with options at [facility].  We talked about items she could buy and have in room for patient. Also talked about having a sign in room to remind patient to eat a 10am snack.      MONITORING, EVALUATION, GOAL: weight trends, intake   NEXT VISIT: November 18 following radiation  Aniyla Harling B. Zenia Resides, [place], [place] Registered Dietitian [phone] (pager)

## 2017-12-06 ENCOUNTER — Ambulatory Visit: Payer: Medicare Other

## 2017-12-09 ENCOUNTER — Ambulatory Visit: Payer: Medicare Other

## 2017-12-09 ENCOUNTER — Telehealth: Payer: Self-pay

## 2017-12-09 NOTE — Telephone Encounter (Signed)
Per Charlotte Crumb (transportation), pt's sister Basilia Jumbo called to cancel pick up because they were not coming to appt tomorrow 11/5. Spoke with Dub Mikes and she stated that he did not want to continue with chemo. I advised her to keep appointment tomorrow with Dr. Tasia Catchings and she could discuss her concerns with her. Dub Mikes agreed and she will be coming with Mr. Macleod tomorrow.

## 2017-12-10 ENCOUNTER — Ambulatory Visit: Payer: Medicare Other

## 2017-12-10 ENCOUNTER — Inpatient Hospital Stay: Payer: Medicare Other

## 2017-12-10 ENCOUNTER — Inpatient Hospital Stay: Payer: Medicare Other | Admitting: Oncology

## 2017-12-11 ENCOUNTER — Ambulatory Visit: Payer: Medicare Other

## 2017-12-12 ENCOUNTER — Ambulatory Visit: Payer: Medicare Other

## 2017-12-13 ENCOUNTER — Ambulatory Visit: Payer: Medicare Other

## 2017-12-16 ENCOUNTER — Ambulatory Visit: Payer: Medicare Other

## 2017-12-17 ENCOUNTER — Ambulatory Visit: Payer: Medicare Other

## 2017-12-18 ENCOUNTER — Ambulatory Visit: Payer: Medicare Other

## 2017-12-18 ENCOUNTER — Telehealth: Payer: Self-pay | Admitting: *Deleted

## 2017-12-18 NOTE — Telephone Encounter (Signed)
t sister called stating that he has stopped chemotherapy and wants to know what happens next. Please return her call at [phone]

## 2017-12-18 NOTE — Telephone Encounter (Signed)
Can you follow up with patient for a follow up? Discussion for goal of care. thanks

## 2017-12-19 ENCOUNTER — Ambulatory Visit: Payer: Medicare Other

## 2017-12-19 ENCOUNTER — Telehealth: Payer: Self-pay | Admitting: Urology

## 2017-12-19 NOTE — Telephone Encounter (Signed)
Jordan [name] called to cancel pt upcoming appt. Jordan [name] states pt only has about 3 weeks to live and future appts are no longer needed. FYI

## 2017-12-20 ENCOUNTER — Ambulatory Visit: Payer: Medicare Other

## 2017-12-23 ENCOUNTER — Ambulatory Visit: Payer: Medicare Other

## 2017-12-24 ENCOUNTER — Ambulatory Visit: Payer: Medicare Other | Admitting: Urology

## 2017-12-24 ENCOUNTER — Ambulatory Visit: Payer: Medicare Other

## 2017-12-25 ENCOUNTER — Ambulatory Visit: Payer: Medicare Other

## 2017-12-26 ENCOUNTER — Ambulatory Visit: Payer: Medicare Other

## 2017-12-27 ENCOUNTER — Ambulatory Visit: Payer: Medicare Other

## 2017-12-27 ENCOUNTER — Encounter: Payer: Self-pay | Admitting: Oncology

## 2017-12-30 ENCOUNTER — Ambulatory Visit: Payer: Medicare Other

## 2017-12-30 ENCOUNTER — Encounter: Payer: Self-pay | Admitting: Oncology

## 2017-12-31 ENCOUNTER — Ambulatory Visit: Payer: Medicare Other

## 2018-01-01 ENCOUNTER — Ambulatory Visit: Payer: Medicare Other

## 2018-01-07 ENCOUNTER — Telehealth: Payer: Self-pay | Admitting: *Deleted

## 2018-01-07 NOTE — Telephone Encounter (Signed)
Riley Lam called and states that patient is having clogging of his nephrostomy tubes. Please advise

## 2018-01-07 NOTE — Telephone Encounter (Signed)
Call returned to [facility] and told Marliss Coots that Urology is handling his Nephrostomy tubes. She will call them

## 2018-01-07 NOTE — Telephone Encounter (Signed)
[place]an in urology is who usually orders this, Patient is not under Dr Collie Siad care anymore

## 2018-01-08 ENCOUNTER — Telehealth: Payer: Self-pay | Admitting: Urology

## 2018-01-08 ENCOUNTER — Telehealth: Payer: Self-pay | Admitting: Nurse Practitioner

## 2018-01-08 ENCOUNTER — Encounter: Payer: Self-pay | Admitting: Nurse Practitioner

## 2018-01-08 NOTE — Telephone Encounter (Signed)
Please call and have pt come in for an appointment today on nurse schedule

## 2018-01-08 NOTE — Telephone Encounter (Signed)
01/07/2018 at 6:30PM Jordan [name], Jordan [name] sister Healthcare power of attorney called. She updated that she feels like Jordan [name] could possibly be worsening in overall decline. Jordan [name] endorses that she no longer wants to continue to keep him out of his ability and would like to bring him home. Jordan [name] has what resources were available. We discussed the option of hospice screening and services provided as he has a terminal diagnosis with life expectancy of six months or less with progression, severe debility and ongoing decline. Jordan [name] endorses that she would like to further discuss with social worker Jordan [name] at the facility and update on wishes to bring him home with Hospice screening. Jordan [name] talked about the difficulty with the ongoing decline setting of terminal disease, hard to watch Jordan [name] continue to decline. Talked about coping strategies. Jordan [name] was very thankful for palliative Care phone discussion. Therapeutic listening and emotional support provided. Contact information provided. Questions answered to satisfaction.  Total time spent 35 minutes  Documentation 10 minutes  Phone discussion 25 minutes

## 2018-01-08 NOTE — Telephone Encounter (Signed)
Riley Lam from [facility] left message about pt's nephrostomy tubes being clogged and wants to know if he should come in for appt.  Please call facility [phone]

## 2018-01-09 ENCOUNTER — Ambulatory Visit (INDEPENDENT_AMBULATORY_CARE_PROVIDER_SITE_OTHER): Payer: Medicare Other | Admitting: Family Medicine

## 2018-01-09 VITALS — BP 109/70 | HR 69

## 2018-01-09 DIAGNOSIS — C678 Malignant neoplasm of overlapping sites of bladder: Secondary | ICD-10-CM

## 2018-01-09 NOTE — Progress Notes (Signed)
Patient presents today from [facility] with complaints of clogged Neph tube. It was flushed today in office with success. I consulted with Dr. Erlene Quan about the issue. He needs to be scheduled for a Neph tube change bilaterally.   10ML of Sodium Chloride was pushed in the right Neph tube and flush 2 times. Patient tolerated well.

## 2018-01-10 ENCOUNTER — Telehealth: Payer: Self-pay | Admitting: Nurse Practitioner

## 2018-01-10 NOTE — Telephone Encounter (Signed)
Edwena Blow, Mr. Novosel sister called with questions concerning nephrostomy tubes. He just returned from a visit where he had them replaced and they shared with her that they would have to be replaced again once she returns home. She had concerns about being able to transport him to his appointment as he remains bed-bound. We talked about nephrostomy tubes for comfort. Dub Mikes endorses she will be taking him home in a few weeks. We talked about hospice screening of which she request. We talked about closer it gets to the time of discharge we'll revisit nephrostomy tubes. Dub Mikes talked about feeling overwhelmed with changes that have been happening clinically with mr. Nehme and overall decline. Dub Mikes endorses that she has been sick herself. Therapeutic listening and emotional support provided. We talked about coping strategies. Dub Mikes an agreement with plan of care. Medical goals continue to remain with focus on Comfort. Questions answered satisfaction  Total time spent 35 minutes  Documentation 10 minutes  Phone discussion 25 minutes

## 2018-01-10 NOTE — Addendum Note (Signed)
Addended by: Hollice Espy on: 01/10/2018 08:43 AM   Modules accepted: Orders

## 2018-01-10 NOTE — Progress Notes (Signed)
This is a patient with advanced urothelial carcinoma and bilateral nephrostomy tubes.  He is currently there palliative care but is continued to have issues with his nephrostomy tubes and are overdue for exchange.  Please help arrange.  Order placed.  Hollice Espy, MD

## 2018-01-14 NOTE — Progress Notes (Signed)
Notified both sister, Edwena Blow (phone [phone]), and Marliss Coots at [facility] (phone [phone]) of bilateral nephrostomy tube exchange scheduled 01/17/2018 at 1:00. Sister & Marliss Coots both voice understanding.

## 2018-01-15 ENCOUNTER — Non-Acute Institutional Stay: Payer: Medicare Other | Admitting: Nurse Practitioner

## 2018-01-15 VITALS — HR 82 | Temp 98.0°F | Resp 20 | Wt 154.3 lb

## 2018-01-15 DIAGNOSIS — R630 Anorexia: Secondary | ICD-10-CM

## 2018-01-15 DIAGNOSIS — Z515 Encounter for palliative care: Secondary | ICD-10-CM

## 2018-01-15 DIAGNOSIS — R531 Weakness: Secondary | ICD-10-CM

## 2018-01-16 DIAGNOSIS — R531 Weakness: Secondary | ICD-10-CM | POA: Insufficient documentation

## 2018-01-16 DIAGNOSIS — Z515 Encounter for palliative care: Secondary | ICD-10-CM | POA: Insufficient documentation

## 2018-01-16 DIAGNOSIS — R630 Anorexia: Secondary | ICD-10-CM | POA: Insufficient documentation

## 2018-01-16 NOTE — Progress Notes (Signed)
[facility] Telephone: [phone] Fax: [phone]  PATIENT NAME: Jordan [name] DOB: 1950-11-08 MRN: [id number]  PRIMARY CARE PROVIDER:   Albina Billet, MD  REFERRING PROVIDER: Dr Lovie Macadamia; [facility]:   Georga Hacking sister [phone] or [phone]  ASSESSMENT:     I visited and observed Mr. Statzer. We talked about purpose or palliative medicine visit. We talked about symptoms of pain and shortness of breath which he denies. We talked about his appetite and he sure that it's improving somewhat but mostly he's not hungry. We talked about the plan for him to be discharged home with hospice screening. We talked about his overall weakness and ability. We talked about role of palliative medicine and plan of care. Emotional support provided. He was cooperative with assessment. He does continue to have intact nephrostomy tubes. I have attempted to contact his sister, Joaquim Lai concerning palliative care visit. Will continue with DNR and current plan of care to be discharged home with hospice screening.  9 / 23 / 2019 weight 71.3 kg  10 / 7 / 2018 weight 70kg  RECOMMENDATIONS and PLAN:  1.Generalized weakness R53.1  secondary to Parkinson's disease continue with therapy as able. Encourage energy conservation and rest times.  2. Anorexia R63.0. Support encourage to continue to go to the dining area for meals. Assistance as needed continues to encourage to feed self. Continue supplements, snacks and weights.  3. Palliative care encounter Z51.5; Ongoing monitoring chronic disease management, symptoms, goc with emotional support.  I spent 45 minutes providing this consultation,  from 12:00pm to 12:45pm. More than 50% of the time in this consultation was spent coordinating communication.   HISTORY OF PRESENT ILLNESS:  Jordan [name] is a 67 y.o. year old male with multiple medical problems including colon cancer 5 / 2013 s/p right hemicolectomy, colon  polyps, urothelial cancer with transurethral resection of bladder/ nephrostomy tubes, bilateral cystoscopy with stent placement, CVA with right-sided weakness, atrial fibrillation s/p cardioversion,  diabetes, hyperthyroidism, thyrotoxicosis, anemia, thrombocytosis, port-a-cath insertion. Hospitalize 8 / 17 / 2019 to 8 / 27 / 2019 for altered mental status with right-sided weakness, poor appetite found at home. He did have bruising on his shoulder but unable to say if he fell at that time he lived alone. 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 Urgent hemodialysis treatments then discontinued. [facility] Place bilateral nephrostomy tubes secondary to hydronephrosis with obstructive mass in bladder. He underwent a turbt with bilateral ureteral stent placements on 9/18 / 2019 with pathology revealing high-grade carcinoma. Acute on chronic atrial fibrillation on amiodarone, digoxin, Cardizem. He did resume Xarelto. It was recommended to have chemotherapy and radiation with discharge home. He was re hospitalized 9 / 20 / 2019 to 57 / 23 / 2019 after being sent from Urology office with increased blood sugars and hyperkalemia requiring IV antibiotics and fluids.  He was discharged to [facility] hospitalized 9 / 30 / 2019 to 10 / 7 / 2019 for acute renal failure, increase creatinine secondary to obstructive uropathy with highly invasive bladder cancer with nephrosis, nephrostomy tubes and a percutaneous post urethral stenting. He was discharged back to [facility] for short-term rehab what she has been unable to  participate in. Since at short-term rehab he is unable to go to the cancer center for treatments. He verbalize to his sister that he no longer wants  to seek aggressive interventions and wishes for Comfort Care. He is very weak, difficulty  transferring himself with severe functional decline. He does require total ADL assistance. He does have nephrostomy tubes which were recently replaced due to being obstructed. He does feed himself though appetite remains poor. He is not on oxygen. He is able to verbalize his needs with intermittent confusion. Plan is for him to be discharged home next week with Hospice screening. At present he is lying in bed, appears comfortable. No visitors present. Palliative Care was asked to help address goals of care.   CODE STATUS: DNR  PPS: 30% HOSPICE ELIGIBILITY/DIAGNOSIS: appears <6 months with clinical presentation  PAST MEDICAL HISTORY:  Past Medical History:  Diagnosis Date  . Atrial fibrillation ([place])   . Cancer ([place])    a. 06/2011 b. s/p right hemicolectomy/ colon CA  . Colon polyps   . Dysrhythmia   . Family history of cancer   . Hypertension   . Hyperthyroidism    a. 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 Nursing home resident   . Stroke ([place]) 03/2012   a. residual right sided weakness  . Type 1 diabetes ([place])    a. on insulin b. prior DKA     SOCIAL HX:  Social History   Tobacco Use  . Smoking status: Former Smoker    Years: 10.00    Types: Cigarettes    Last attempt to quit: 09/21/1973    Years since quitting: 44.3  . Smokeless tobacco: Never Used  Substance Use Topics  . Alcohol use: No    ALLERGIES:  Allergies  Allergen Reactions  . Amiodarone Other (See Comments)    Thyrotoxicosis per Murray Hodgkins, NP     PERTINENT MEDICATIONS:  Outpatient Encounter Medications as of 01/15/2018  Medication Sig  . acetaminophen (TYLENOL) 325 MG tablet Take 2 tablets (650 mg total) by mouth every 6 (six) hours as needed for mild pain (or Fever >/= 101).  Marland Kitchen amiodarone (PACERONE) 200 MG tablet Take 200 mg by mouth daily.  Marland Kitchen amLODipine (NORVASC) 10 MG tablet Take 10 mg by mouth daily.  Marland Kitchen atorvastatin (LIPITOR) 40 MG tablet Take 40 mg by mouth daily.  .  digoxin 62.5 MCG TABS Take 0.0625 mg by mouth daily.  Marland Kitchen docusate sodium (COLACE) 100 MG capsule Take 1 capsule (100 mg total) by mouth 2 (two) times daily.  Marland Kitchen dronabinol (MARINOL) 5 MG capsule Take 1 capsule (5 mg total) by mouth 2 (two) times daily before a meal.  . famotidine (PEPCID) 20 MG tablet Take 1 tablet (20 mg total) by mouth daily.  . folic acid (FOLVITE) 1 MG tablet Take 1 mg by mouth daily.  Marland Kitchen HYDROcodone-acetaminophen (NORCO/VICODIN) 5-325 MG tablet Take 1-2 tablets by mouth every 6 (six) hours as needed for moderate pain.  Marland Kitchen insulin aspart (NOVOLOG) 100 UNIT/ML injection Inject 10 Units into the skin 3 (three) times daily with meals. (Patient taking differently: Inject 13 Units into the skin 3 (three) times daily with meals. )  . insulin aspart (NOVOLOG) 100 UNIT/ML injection Inject 0-15 Units into the skin 4 (four) times daily -  before meals and at bedtime. CBG < 70: implement hypoglycemia protocol CBG 70 - 120: 0 units CBG 121 - 150: 2 units CBG 151 - 200: 3 units CBG 201 - 250: 5 units CBG 251 - 300: 8 units CBG 301 - 350: 11 units CBG 351 - 400: 15 units CBG >  400: call MD and obtain STAT lab verification  . insulin glargine (LANTUS) 100 UNIT/ML injection Inject 0.37 mLs (37 Units total) into the skin daily.  Marland Kitchen lidocaine-prilocaine (EMLA) cream Apply to affected area once  . loratadine (CLARITIN) 10 MG tablet Take 1 tablet (10 mg total) by mouth daily.  . metoprolol tartrate (LOPRESSOR) 25 MG tablet Take 1 tablet (25 mg total) by mouth 2 (two) times daily.  . Multiple Vitamin (MULTIVITAMIN WITH MINERALS) TABS tablet Take 1 tablet by mouth daily.  . ondansetron (ZOFRAN) 8 MG tablet Take 1 tablet (8 mg total) by mouth 2 (two) times daily as needed for refractory nausea / vomiting. Start on day 3 after carboplatin chemo.  Marland Kitchen oxybutynin (DITROPAN) 5 MG tablet Take 1 tablet (5 mg total) by mouth every 8 (eight) hours as needed for bladder spasms.  . polyethylene glycol (MIRALAX /  GLYCOLAX) packet Take 17 g by mouth daily.  . prochlorperazine (COMPAZINE) 10 MG tablet Take 1 tablet (10 mg total) by mouth every 6 (six) hours as needed (Nausea or vomiting).  . protein supplement shake (PREMIER PROTEIN) LIQD Take 325 mLs (11 oz total) by mouth 2 (two) times daily between meals.  . Rivaroxaban (XARELTO) 15 MG TABS tablet Take 15 mg by mouth daily.  . rosuvastatin (CRESTOR) 5 MG tablet Take 5 mg by mouth daily.  Marland Kitchen senna-docusate (SENOKOT-S) 8.6-50 MG tablet Take 1 tablet by mouth daily.  . Simethicone (MYLANTA GAS PO) Take by mouth.  . tamsulosin (FLOMAX) 0.4 MG CAPS capsule Take 1 capsule (0.4 mg total) by mouth daily after supper.  . traZODone (DESYREL) 50 MG tablet Take 50 mg by mouth at bedtime.  . vitamin C (VITAMIN C) 250 MG tablet Take 1 tablet (250 mg total) by mouth 2 (two) times daily.   Facility-Administered Encounter Medications as of 01/15/2018  Medication  . heparin lock flush 100 unit/mL    PHYSICAL EXAM:   General: NAD, frail appearing, thin male Cardiovascular: regular rate and rhythm Pulmonary: clear ant fields Abdomen: soft, nontender, + bowel sounds GU: no suprapubic tenderness Extremities: no edema, no joint deformities Skin: no rashes Neurological: severe generalized weakness  Milina Pagett Ihor Gully, NP

## 2018-01-16 NOTE — OR Nursing (Signed)
Pt currently resident of [facility]. Transportation [phone] [place] [phone]

## 2018-01-17 ENCOUNTER — Ambulatory Visit
Admission: RE | Admit: 2018-01-17 | Discharge: 2018-01-17 | Disposition: A | Discharge status: Home / Self Care | Payer: Medicare Other | Source: Ambulatory Visit | Attending: Urology | Admitting: Urology

## 2018-01-17 ENCOUNTER — Encounter: Payer: Self-pay | Admitting: Interventional Radiology

## 2018-01-17 ENCOUNTER — Other Ambulatory Visit (HOSPITAL_COMMUNITY): Payer: Self-pay | Admitting: Interventional Radiology

## 2018-01-17 DIAGNOSIS — C679 Malignant neoplasm of bladder, unspecified: Secondary | ICD-10-CM | POA: Insufficient documentation

## 2018-01-17 DIAGNOSIS — Z436 Encounter for attention to other artificial openings of urinary tract: Secondary | ICD-10-CM | POA: Diagnosis present

## 2018-01-17 DIAGNOSIS — C689 Malignant neoplasm of urinary organ, unspecified: Secondary | ICD-10-CM

## 2018-01-17 DIAGNOSIS — C678 Malignant neoplasm of overlapping sites of bladder: Secondary | ICD-10-CM

## 2018-01-17 HISTORY — PX: IR NEPHROSTOMY EXCHANGE RIGHT: IMG6070

## 2018-01-17 HISTORY — PX: IR NEPHROSTOMY EXCHANGE LEFT: IMG6069

## 2018-01-17 MED ORDER — IOPAMIDOL (ISOVUE-300) INJECTION 61%
50.0000 mL | Freq: Once | INTRAVENOUS | Status: AC | PRN
  Administered 2018-01-17: 10 mL via INTRAVENOUS

## 2018-01-17 NOTE — Discharge Instructions (Signed)
Please call [facility] Vascular interventional radiology specialist Office [phone] when nephrostomy tubes need to be changed. Tell them he has had the tubes changed at [place] center.  Today's procedure was performed by Dr Lucrezia Europe. 01/17/2018   Percutaneous Nephrostomy, Care After This sheet gives you information about how to care for yourself after your procedure. Your health care provider may also give you more specific instructions. If you have problems or questions, contact your health care provider. What can I expect after the procedure? After the procedure, it is common to have:  Some soreness where the nephrostomy tube was inserted (tube insertion site).  Blood-tinged drainage from the nephrostomy tube for the first 24 hours.  Follow these instructions at home: Activity  Return to your normal activities as told by your health care provider. Ask your health care provider what activities are safe for you.  Avoid activities that may cause the nephrostomy tubing to bend.  Do not take baths, swim, or use a hot tub until your health care provider approves. Ask your health care provider if you can take showers. Cover the nephrostomy tube dressing with a watertight covering when you take a shower.  Donot drive for 24 hours if you were given a medicine to help you relax (sedative). Care of the tube insertion site  Follow instructions from your health care provider about how to take care of your tube insertion site. Make sure you: ? Wash your hands with soap and water before you change your bandage (dressing). If soap and water are not available, use hand sanitizer. ? Change your dressing as told by your health care provider. Be careful not to pull on the tube while removing the dressing. ? When you change the dressing, wash the skin around the tube, rinse well, and pat the skin dry.  Check the tube insertion area every day for signs of infection. Check  for: ? More redness, swelling, or pain. ? More fluid or blood. ? Warmth. ? Pus or a bad smell. Care of the nephrostomy tube and drainage bag  Always keep the tubing, the leg bag, or the bedside drainage bags below the level of the kidney so that your urine drains freely.  When connecting your nephrostomy tube to a drainage bag, make sure that there are no kinks in the tubing and that your urine is draining freely. You may want to use an elastic bandage to wrap any exposed tubing that goes from the nephrostomy tube to any of the connecting tubes.  At night, you may want to connect your nephrostomy tube or the leg bag to a larger bedside drainage bag.  Follow instructions from your health care provider about how to empty or change the drainage bag.  Empty the drainage bag when it becomes ? full.  Replace the drainage bag and any extension tubing that is connected to your nephrostomy tube every 3 weeks or as often as told by your health care provider. Your health care provider will explain how to change the drainage bag and extension tubing. General instructions  Take over-the-counter and prescription medicines only as told by your health care provider.  Keep all follow-up visits as told by your health care provider. This is important. Contact a health care provider if:  You have problems with any of the valves or tubing.  You have persistent pain or soreness in your back.  You have more redness, swelling, or pain around your tube insertion site.  You have more fluid  or blood coming from your tube insertion site.  Your tube insertion site feels warm to the touch.  You have pus or a bad smell coming from your tube insertion site.  You have increased urine output or you feel burning when urinating. Get help right away if:  You have pain in your abdomen during the first week.  You have chest pain or have trouble breathing.  You have a new appearance of blood in your  urine.  You have a fever or chills.  You have back pain that is not relieved by your medicine.  You have decreased urine output.  Your nephrostomy tube comes out. This information is not intended to replace advice given to you by your health care provider. Make sure you discuss any questions you have with your health care provider. Document Released: 09/15/2003 Document Revised: 11/04/2015 Document Reviewed: 11/04/2015 Elsevier Interactive Patient Education  [employer].

## 2018-01-17 NOTE — Procedures (Signed)
  Procedure: Bilat perc neph exchange 20f EBL:   minimal Complications:  none immediate  See full dictation in [facility].  Dillard Cannon MD Main # [phone] Pager  [phone]

## 2018-01-20 ENCOUNTER — Telehealth: Payer: Self-pay | Admitting: *Deleted

## 2018-01-20 ENCOUNTER — Telehealth: Payer: Self-pay | Admitting: Nurse Practitioner

## 2018-01-20 NOTE — Telephone Encounter (Signed)
Jordan [name], Jordan [name] sister called with further questions concerning port-a-cath and have that will be flushed once he returns home. Jordan [name] verbalizes that he had a very rough time this past weekend. She verbalize she thought he was going to pass away. Jordan [name] talked at length about possibly not being able to get him home that if he has another bout like he did this past weekend he will likely passed. We talked about Hospice Services provided and nursing with port-a-cath care. We talked about nephrectomy tubes and he is to have them replaced February / 2019. We talked about realistic life expectancy for Jordan [name]. We talked about may not even requiring nephrectomy tubes to be replaced for Comfort. We talked about role of palliative medicine and plan of care. We talked about quality of life versus quantity of days. We talked about progression of metastatic disease. She talked about bringing him home with hospice screening. Questions answered to satisfaction. Therapeutic listening and emotional support provided. Contact information provided.  Total time spent 45 minutes  Documentation 10 minutes  Phone discussion 35 minutes

## 2018-01-20 NOTE — Telephone Encounter (Signed)
Patient is very weak and cannot keep coming to have his port flushe, they will try to get here for appointment this week, but is requesting to have it removed and would like to know how to get it out. Please advise

## 2018-01-20 NOTE — Telephone Encounter (Signed)
Per VO Dr Tasia Catchings he can have port removed and she has told her nurse this. I left message on Ms Jordan [name] voice mail to expect a call for removal

## 2018-01-21 ENCOUNTER — Other Ambulatory Visit: Payer: Self-pay | Admitting: *Deleted

## 2018-01-21 DIAGNOSIS — C689 Malignant neoplasm of urinary organ, unspecified: Secondary | ICD-10-CM

## 2018-01-21 NOTE — Telephone Encounter (Signed)
Order placed and message sent to Dr Ozella Almond office

## 2018-01-21 NOTE — Telephone Encounter (Signed)
This encounter was created in error - please disregard.

## 2018-01-22 ENCOUNTER — Other Ambulatory Visit (INDEPENDENT_AMBULATORY_CARE_PROVIDER_SITE_OTHER): Payer: Self-pay | Admitting: Vascular Surgery

## 2018-01-22 ENCOUNTER — Telehealth (INDEPENDENT_AMBULATORY_CARE_PROVIDER_SITE_OTHER): Payer: Self-pay

## 2018-01-22 ENCOUNTER — Encounter (INDEPENDENT_AMBULATORY_CARE_PROVIDER_SITE_OTHER): Payer: Self-pay

## 2018-01-22 NOTE — Telephone Encounter (Signed)
Spoke with the patient's sister and scheduled the patient for his port removal on 01/27/18 with Dr. Lucky Cowboy with a 9:00 am arrival time. This information was faxed to [place] at [facility] after speaking with Belinda.

## 2018-01-23 ENCOUNTER — Telehealth: Payer: Self-pay | Admitting: Nurse Practitioner

## 2018-01-23 NOTE — Telephone Encounter (Signed)
Jordan [name], Jordan [name] called. She had more questions concerning his port-a-cath. Jordan [name] asked if port-a-cath should remain or if she should have his port-a-cath removed. We talked about risks and benefits of removing the port-a-cath. We also talked about if the port-a-cath is left which is standard hospice can access if needed for symptom management. We talked about port-a-cath management. She talked about Jordan [name] being so weak he's not able to go to the cancer center to have the port-a-cath removed or flushed. Jordan [name] endorses she's decided to leave the port-a-cath. We talked about Hospice Services. Jordan [name] endorses she is very thankful for palliative medicine and being available to answer questions, help her with goals, support. Therapeutic listening and emotional support provided. Questions answered to satisfaction. Contact information provided.  Total time spent 25 minutes  Documentation 10 minutes  Phone discussion 15 minutes

## 2018-01-24 ENCOUNTER — Inpatient Hospital Stay: Payer: Medicare Other

## 2018-01-27 ENCOUNTER — Ambulatory Visit: Admission: RE | Admit: 2018-01-27 | Payer: Medicare Other | Source: Home / Self Care | Admitting: Vascular Surgery

## 2018-01-27 ENCOUNTER — Encounter: Admission: RE | Payer: Self-pay | Source: Home / Self Care

## 2018-01-27 SURGERY — PORTA CATH REMOVAL
Anesthesia: Moderate Sedation

## 2018-03-11 ENCOUNTER — Telehealth: Payer: Self-pay | Admitting: *Deleted

## 2018-03-11 NOTE — Telephone Encounter (Signed)
Patient's sister Basilia Jumbo called and stated that the 03/14/18 appt need to be cancelled.. She stated that she would call back later to R/S

## 2018-03-14 ENCOUNTER — Inpatient Hospital Stay: Payer: Medicare Other

## 2018-03-21 ENCOUNTER — Ambulatory Visit: Admission: RE | Admit: 2018-03-21 | Payer: Medicare Other | Source: Ambulatory Visit

## 2018-03-27 ENCOUNTER — Other Ambulatory Visit: Payer: Medicare Other

## 2018-03-27 ENCOUNTER — Ambulatory Visit: Payer: Medicare Other | Admitting: Oncology

## 2018-04-29 ENCOUNTER — Telehealth: Payer: Self-pay | Admitting: Urology

## 2018-04-29 NOTE — Telephone Encounter (Signed)
Can you give the hospice lady the IR number and some direction as to what to do to help patient.

## 2018-04-29 NOTE — Telephone Encounter (Signed)
Colletta Maryland called from the hospice house asking for someone to call her about this patient. He has nephrostomy tubes and they are leaking and she is unsure what to do about it. Can someone please call her back to discuss. She said something about the stitch had can loose? Not sure what that meant, she left a voicemail [phone]  [facility]

## 2018-04-30 ENCOUNTER — Telehealth: Payer: Self-pay | Admitting: Urology

## 2018-04-30 NOTE — Telephone Encounter (Signed)
Have been unable to reach IR for guidance related to patient's nephrostomy tube. Tilda Franco from Hospice at [phone] who states nephrostomy tube suture has come out and site is leaking urine, red and tender. States patient's sister wants the tube to be exchanged.  Will continue to attempt to reach IR. In the meantime advised Colletta Maryland to secure the tube & cover with dressing to keep tube from coming out. Colletta Maryland expresses understanding of instructions.

## 2018-04-30 NOTE — Telephone Encounter (Signed)
Junious Silk that per Curly [place], RN in IR, use antibiotic ointment on the reddened area & IR will provide a Stat-Lock to help keep tube in place. Per Juanita tube can not be exchanged at this time due to COVID-19 restrictions but if tube comes out to call for appointment to get it replaced. Colletta Maryland expresses understanding of conversation.

## 2018-04-30 NOTE — Telephone Encounter (Signed)
Hospice nurse wants to know if Dr Erlene Quan would be able to put in an order for neph tube changed.  Pt's sister said pt is soaking wet, but nurse is going to check when she goes today.  Stephanie [phone]

## 2018-05-02 ENCOUNTER — Inpatient Hospital Stay: Payer: Medicare Other

## 2018-05-20 ENCOUNTER — Encounter: Payer: Self-pay | Admitting: Oncology

## 2018-06-06 DEATH — deceased

## 2018-06-20 ENCOUNTER — Inpatient Hospital Stay: Payer: Medicare Other

## 2020-05-21 IMAGING — XA IR NEPHROSTOMY PLACEMENT LEFT
4 series · 4 of 4 positions shown · non-contrast
Comparison: None.

INDICATION: 66-year-old with acute kidney injury, sepsis and bilateral
hydronephrosis. Patient needs decompression of both kidneys.

EXAM:
PLACEMENT OF BILATERAL NEPHROSTOMY TUBES WITH ULTRASOUND AND
FLUOROSCOPIC GUIDANCE

[Series 2: fl - angio · 1 of 1 slices shown (1 of 4)]
[im 1/1]
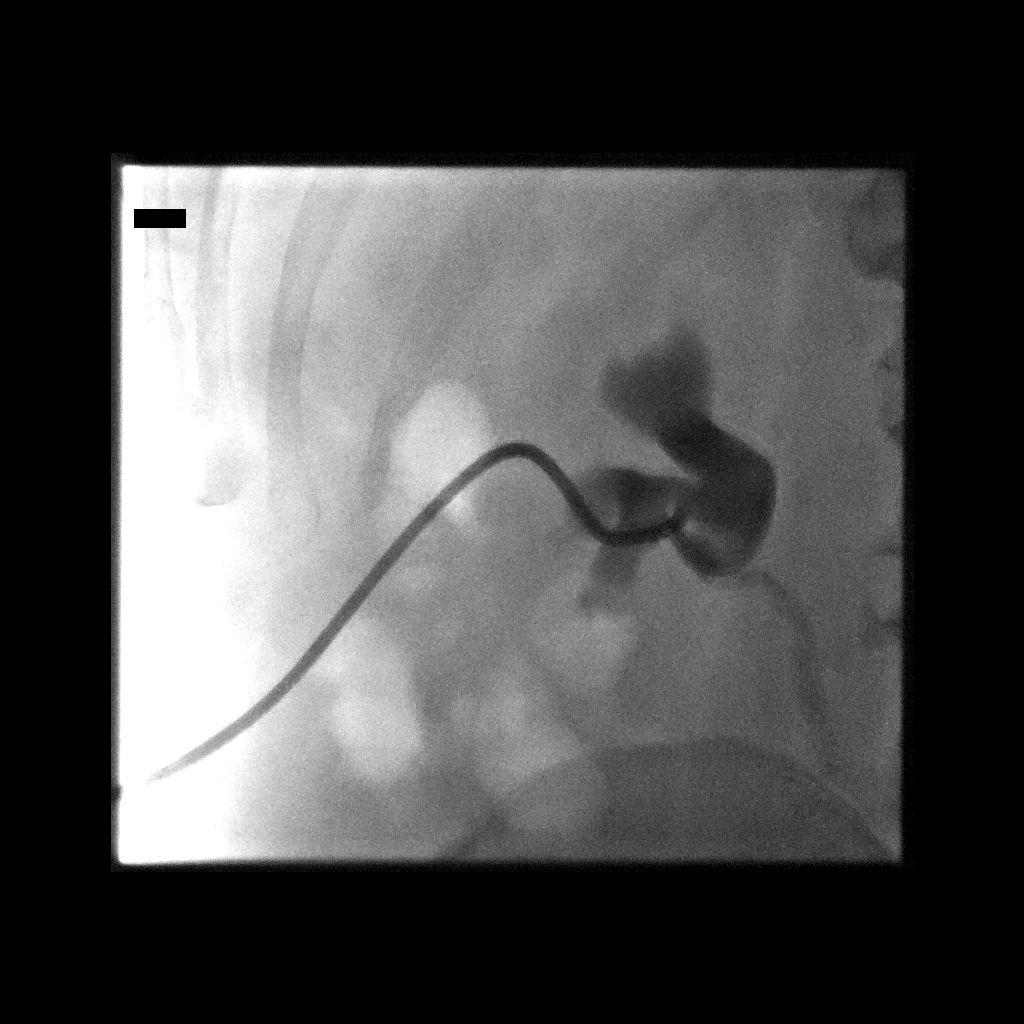

[Series 3: fl - angio · 1 of 1 slices shown (2 of 4)]
[im 1/1]
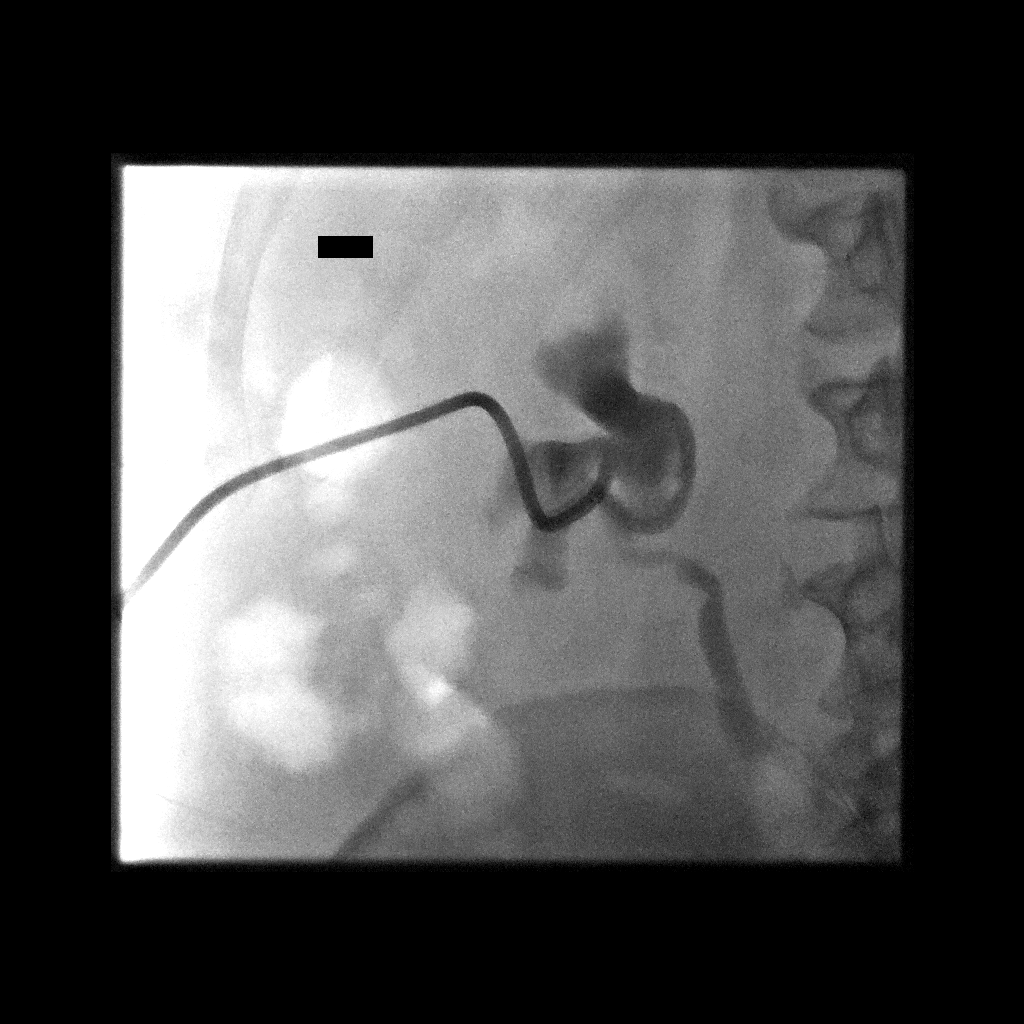

[Series 4: fl - angio · 1 of 1 slices shown (3 of 4)]
[im 1/1]
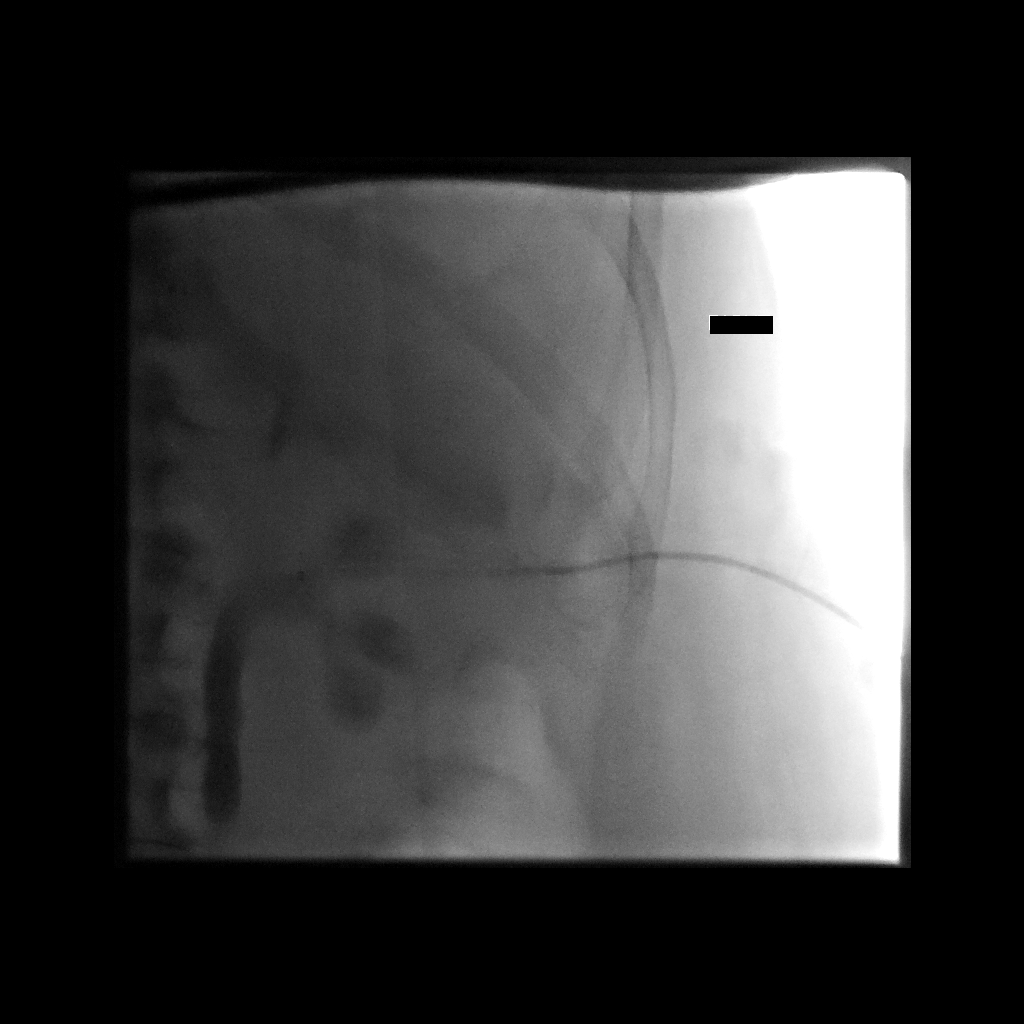

[Series 5: fl - angio · 1 of 1 slices shown (4 of 4)]
[im 1/1]
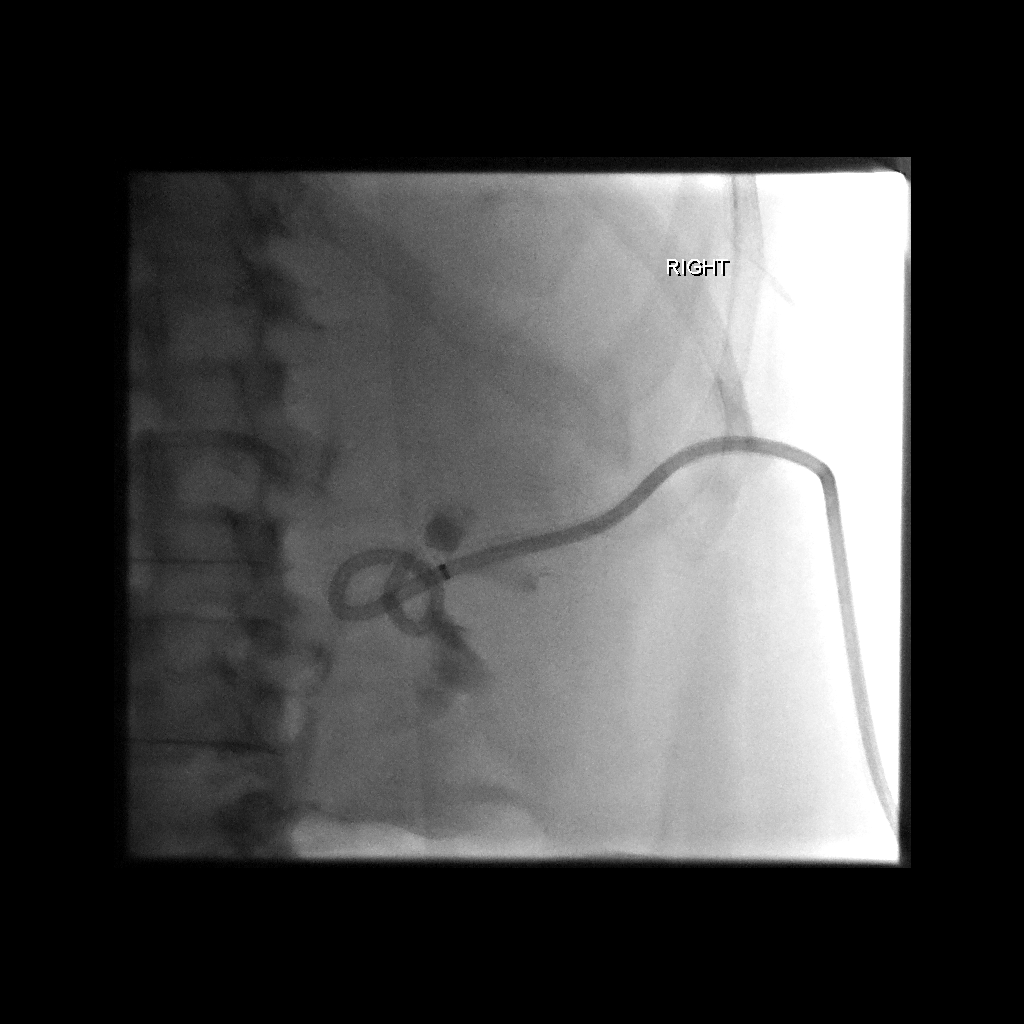

[4 of 4 positions shown; findings below may reference images not displayed]

MEDICATIONS:
Ciprofloxacin 400 mg; The antibiotic was administered in an
appropriate time frame prior to skin puncture.

ANESTHESIA/SEDATION:
Fentanyl 50 mcg IV; Versed 2 mg IV

Moderate Sedation Time:  40 minutes

The patient was continuously monitored during the procedure by the
interventional radiology nurse under my direct supervision.

CONTRAST:  10 mL-administered into the collecting system(s)

FLUOROSCOPY TIME:  Fluoroscopy Time: 3 minutes and 30 seconds, 25
mGy

COMPLICATIONS:
None immediate.

PROCEDURE:
Informed written consent was obtained from the patient's sister
after a thorough discussion of the procedural risks, benefits and
alternatives. All questions were addressed. Maximal Sterile Barrier
Technique was utilized including caps, mask, sterile gowns, sterile
gloves, sterile drape, hand hygiene and skin antiseptic. A timeout
was performed prior to the initiation of the procedure.

Patient was placed prone. Both flanks were prepped and draped in
sterile fashion. Procedure was technically difficult due to
tachypnea and patient restlessness. Left kidney was identified with
ultrasound. The left flank was anesthetized with 1% lidocaine. 21
gauge needle directed into a lower pole calyx with ultrasound
guidance. Small amount of contrast was injected to confirm placement
in the renal collecting system. 0.018 wire was advanced into the
renal collecting system and Accustick dilator set was placed. J wire
was advanced down the ureter and the tract was dilated to
accommodate a 10 French multipurpose drain. 10 French drain was
reconstituted in the renal pelvis. Catheter was sutured to skin and
attached to gravity bag.

Attention was directed to the right kidney. Right kidney is more
difficult to evaluate with ultrasound. Right flank was anesthetized
with 1% lidocaine. 21 gauge needle was directed into a dilated
midpole calyx with ultrasound guidance. Needle position confirmed
within the collecting system with ultrasound. A 0.018 wire advanced
easily down the ureter. An Accustick dilator set was placed. J wire
was advanced down the ureter and the Accustick dilator set was
replaced with a 10 French dilator. Subsequently, a 10 French drain
was advanced over the wire and reconstituted in the right renal
pelvis. It is difficult to reconstitute the pigtail drain in the
renal pelvis to the small size of the collecting system. Catheter
was attached to a gravity bag and sutured to the skin.

Fluoroscopic and ultrasound images were taken and saved for
documentation.
FINDINGS: Mild to moderate bilateral hydronephrosis. Nephrostomy tubes are
positioned in the renal pelvis bilaterally.
IMPRESSION: Successful placement of bilateral percutaneous nephrostomy tubes
with ultrasound and fluoroscopic guidance.

## 2020-06-23 IMAGING — DX DG CHEST 1V PORT
1 series · 2 of 2 positions shown · non-contrast
Comparison: 09/26/2017 and 09/23/2017 radiographs.

CLINICAL DATA: Hyperglycemia. Hyperkalemia. Recent acute renal
failure.

EXAM:
PORTABLE CHEST 1 VIEW

[Series 1: chest ap · 0.14mm/px · 2 of 2 slices shown]
[im 1/2]
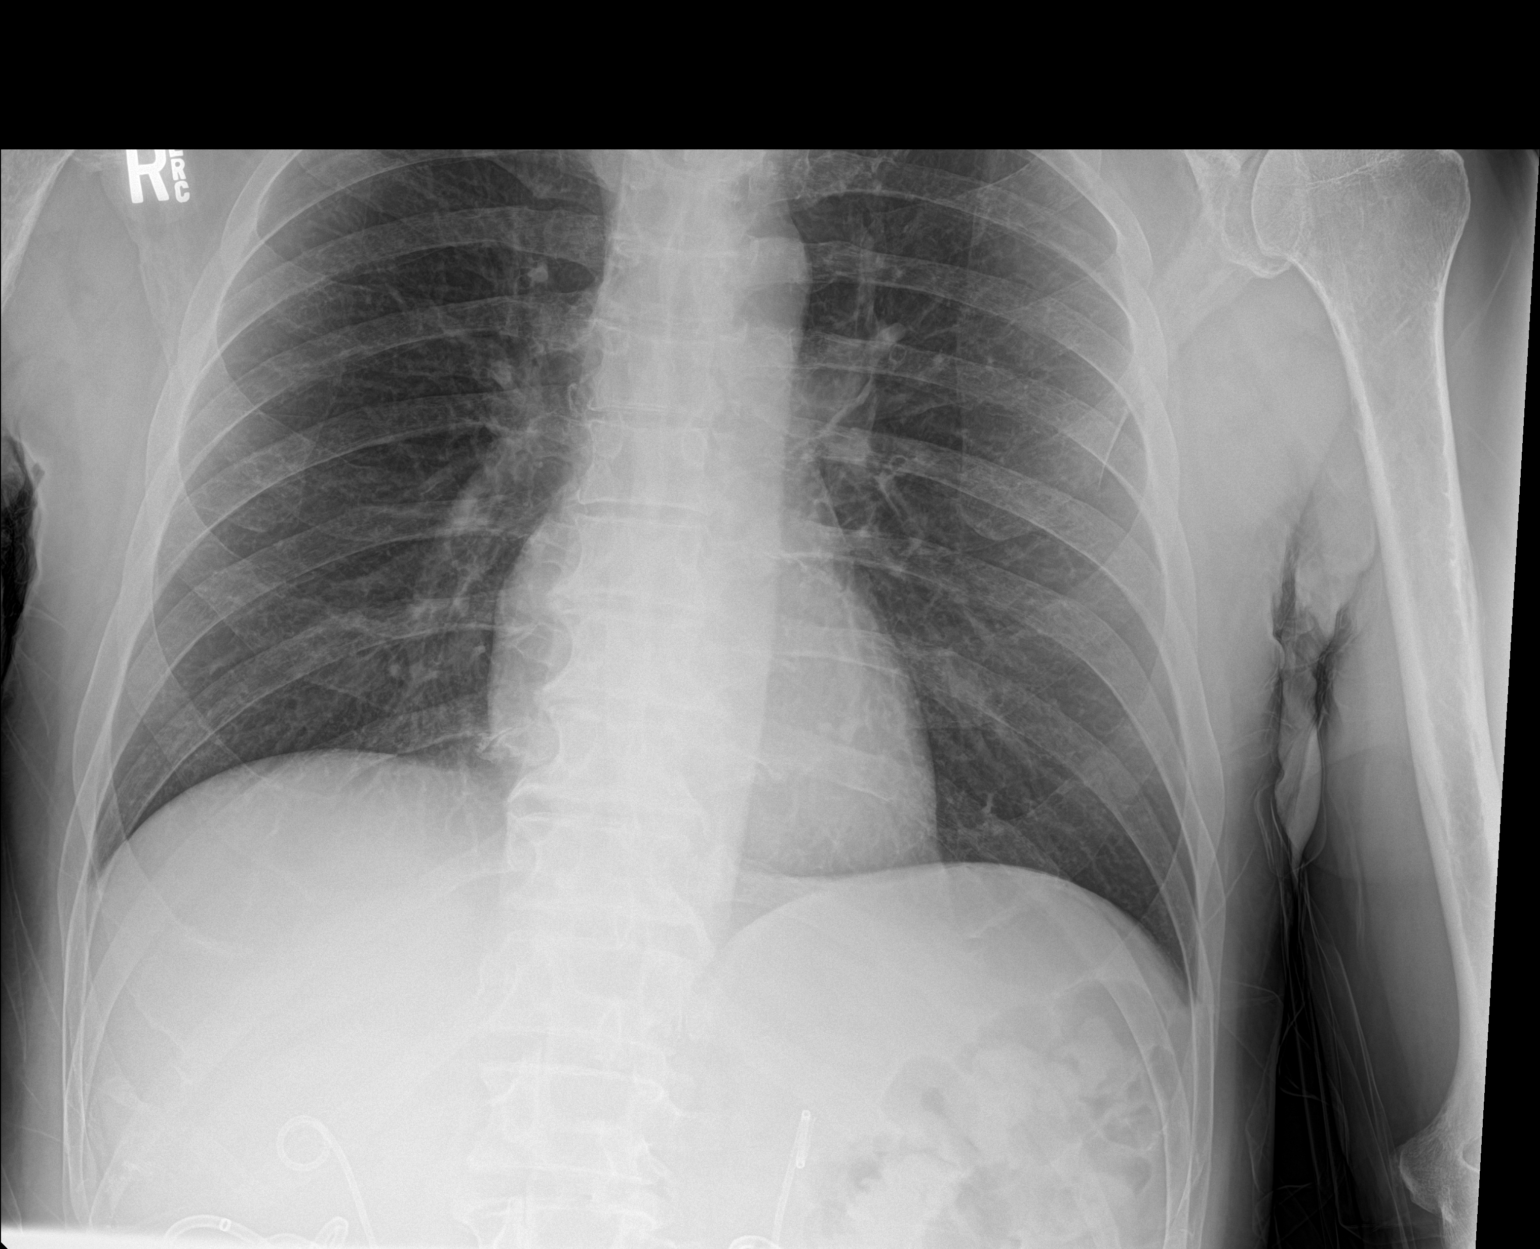
[im 2/2]
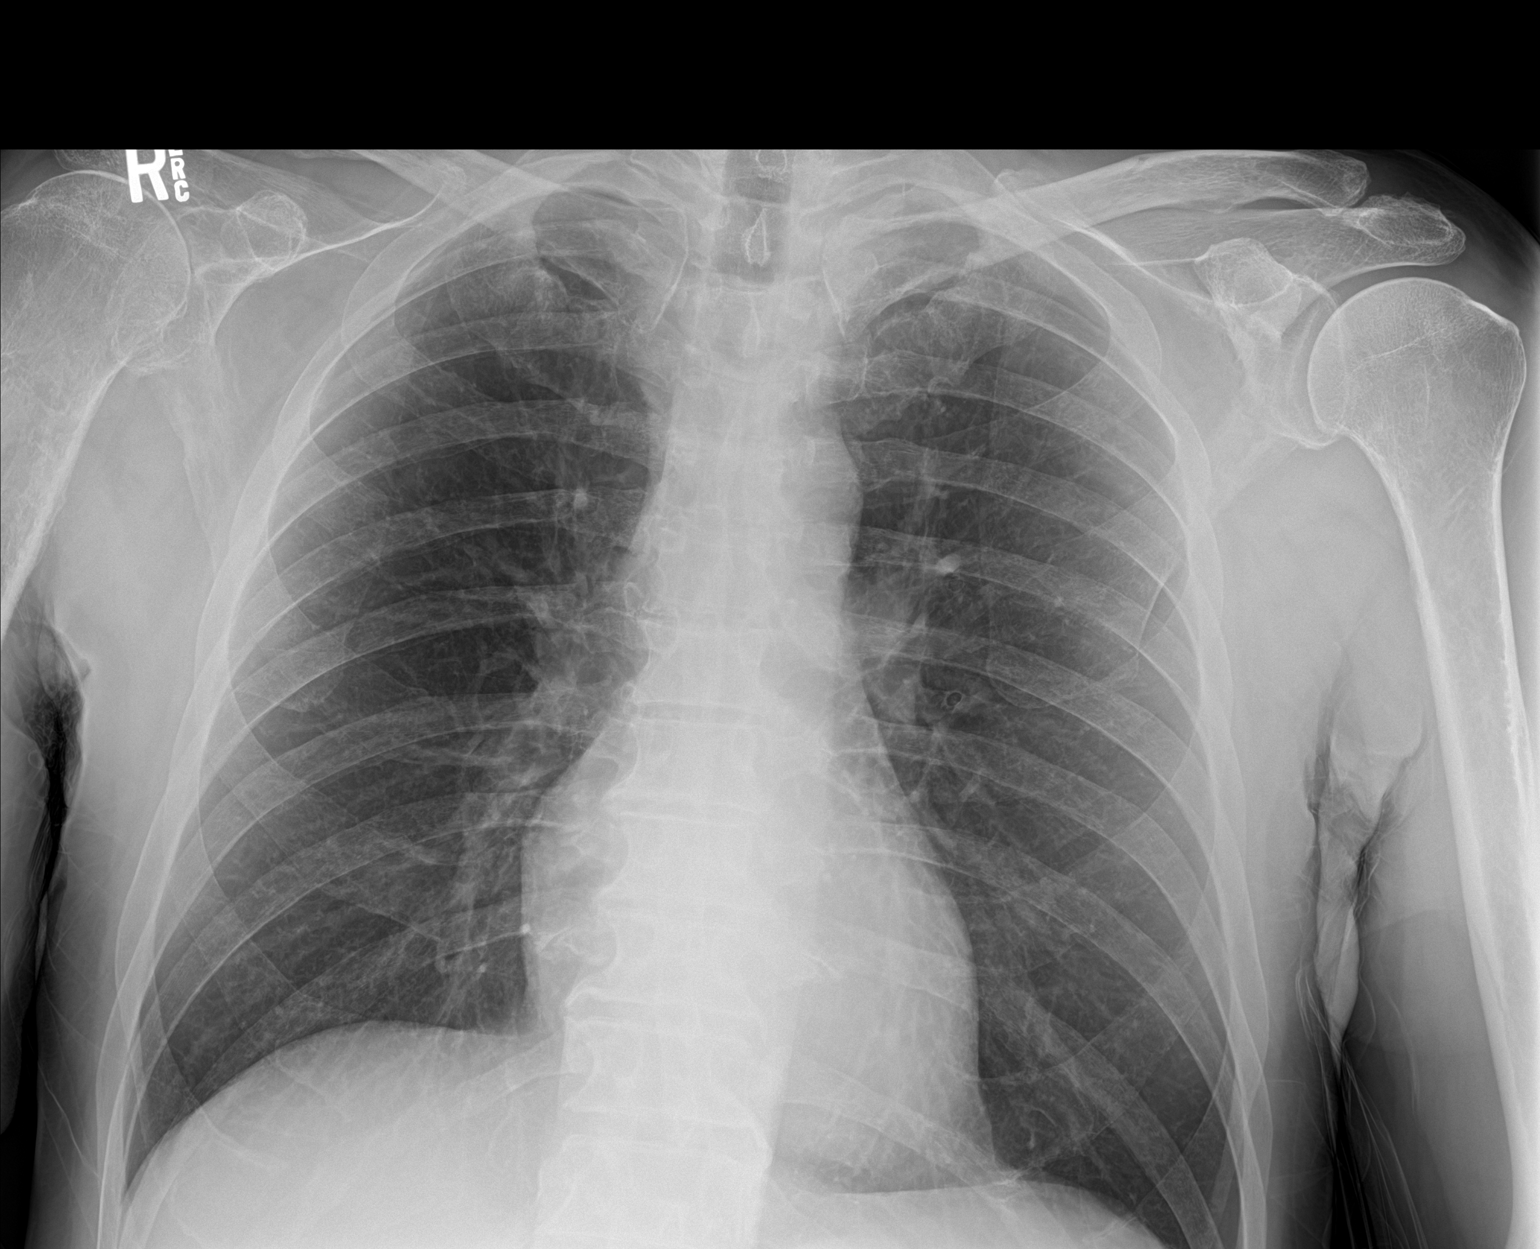

[2 of 2 positions shown; findings below may reference images not displayed]

FINDINGS: 5407 hour. The heart size and mediastinal contours are normal. The
lungs are clear. There is no pleural effusion or pneumothorax. No
acute osseous findings are identified. There are probable bilateral
ureteral stents.
IMPRESSION: No active cardiopulmonary process.
